# Patient Record
Sex: Female | Born: 1935 | Race: White | Hispanic: No | Smoking: Former smoker
Health system: Southern US, Community
[De-identification: ages and names within clinical notes are randomized; demographics above are authoritative.]

## PROBLEM LIST (undated history)

## (undated) DIAGNOSIS — K224 Dyskinesia of esophagus: Secondary | ICD-10-CM

## (undated) DIAGNOSIS — R609 Edema, unspecified: Secondary | ICD-10-CM

## (undated) DIAGNOSIS — K222 Esophageal obstruction: Secondary | ICD-10-CM

## (undated) DIAGNOSIS — M545 Low back pain, unspecified: Secondary | ICD-10-CM

## (undated) DIAGNOSIS — I6529 Occlusion and stenosis of unspecified carotid artery: Secondary | ICD-10-CM

## (undated) DIAGNOSIS — I10 Essential (primary) hypertension: Secondary | ICD-10-CM

## (undated) DIAGNOSIS — I219 Acute myocardial infarction, unspecified: Secondary | ICD-10-CM

## (undated) DIAGNOSIS — H269 Unspecified cataract: Secondary | ICD-10-CM

## (undated) DIAGNOSIS — M1990 Unspecified osteoarthritis, unspecified site: Secondary | ICD-10-CM

## (undated) DIAGNOSIS — M8580 Other specified disorders of bone density and structure, unspecified site: Secondary | ICD-10-CM

## (undated) DIAGNOSIS — E785 Hyperlipidemia, unspecified: Secondary | ICD-10-CM

## (undated) DIAGNOSIS — I509 Heart failure, unspecified: Secondary | ICD-10-CM

## (undated) DIAGNOSIS — N289 Disorder of kidney and ureter, unspecified: Secondary | ICD-10-CM

## (undated) DIAGNOSIS — I482 Chronic atrial fibrillation, unspecified: Secondary | ICD-10-CM

## (undated) DIAGNOSIS — J449 Chronic obstructive pulmonary disease, unspecified: Secondary | ICD-10-CM

## (undated) DIAGNOSIS — S82891A Other fracture of right lower leg, initial encounter for closed fracture: Secondary | ICD-10-CM

## (undated) DIAGNOSIS — I2510 Atherosclerotic heart disease of native coronary artery without angina pectoris: Secondary | ICD-10-CM

## (undated) HISTORY — DX: Occlusion and stenosis of unspecified carotid artery: I65.29

## (undated) HISTORY — DX: Other fracture of right lower leg, initial encounter for closed fracture: S82.891A

## (undated) HISTORY — DX: Unspecified osteoarthritis, unspecified site: M19.90

## (undated) HISTORY — PX: ESOPHAGOGASTRODUODENOSCOPY: SHX1529

## (undated) HISTORY — DX: Atherosclerotic heart disease of native coronary artery without angina pectoris: I25.10

## (undated) HISTORY — DX: Heart failure, unspecified: I50.9

## (undated) HISTORY — DX: Disorder of kidney and ureter, unspecified: N28.9

## (undated) HISTORY — DX: Hyperlipidemia, unspecified: E78.5

## (undated) HISTORY — DX: Low back pain, unspecified: M54.50

## (undated) HISTORY — DX: Unspecified cataract: H26.9

## (undated) HISTORY — DX: Chronic atrial fibrillation, unspecified: I48.20

## (undated) HISTORY — DX: Other specified disorders of bone density and structure, unspecified site: M85.80

## (undated) HISTORY — DX: Edema, unspecified: R60.9

## (undated) HISTORY — DX: Chronic obstructive pulmonary disease, unspecified: J44.9

## (undated) HISTORY — DX: Essential (primary) hypertension: I10

## (undated) HISTORY — DX: Low back pain: M54.5

## (undated) SURGERY — OPEN REDUCTION INTERNAL FIXATION (ORIF) ELBOW/OLECRANON FRACTURE
Anesthesia: Choice | Laterality: Right

---

## 1991-12-28 HISTORY — PX: CORONARY ANGIOPLASTY WITH STENT PLACEMENT: SHX49

## 1995-04-27 DIAGNOSIS — I2510 Atherosclerotic heart disease of native coronary artery without angina pectoris: Secondary | ICD-10-CM

## 1995-04-27 HISTORY — DX: Atherosclerotic heart disease of native coronary artery without angina pectoris: I25.10

## 2004-12-27 HISTORY — PX: CARDIAC CATHETERIZATION: SHX172

## 2005-03-25 ENCOUNTER — Ambulatory Visit: Payer: Self-pay | Admitting: Cardiology

## 2005-03-26 ENCOUNTER — Inpatient Hospital Stay (HOSPITAL_COMMUNITY): Admission: EM | Admit: 2005-03-26 | Discharge: 2005-03-26 | Payer: Self-pay | Admitting: Emergency Medicine

## 2005-03-26 ENCOUNTER — Encounter: Payer: Self-pay | Admitting: Cardiology

## 2005-03-26 ENCOUNTER — Ambulatory Visit: Payer: Self-pay | Admitting: Cardiology

## 2005-04-15 ENCOUNTER — Ambulatory Visit: Payer: Self-pay | Admitting: Cardiology

## 2005-04-20 ENCOUNTER — Ambulatory Visit: Payer: Self-pay

## 2005-04-29 ENCOUNTER — Ambulatory Visit: Payer: Self-pay | Admitting: Cardiology

## 2005-05-04 ENCOUNTER — Inpatient Hospital Stay (HOSPITAL_BASED_OUTPATIENT_CLINIC_OR_DEPARTMENT_OTHER): Admission: RE | Admit: 2005-05-04 | Discharge: 2005-05-04 | Payer: Self-pay | Admitting: Cardiology

## 2005-05-04 ENCOUNTER — Ambulatory Visit: Payer: Self-pay | Admitting: Cardiology

## 2005-05-27 ENCOUNTER — Ambulatory Visit: Payer: Self-pay | Admitting: Cardiology

## 2006-05-29 ENCOUNTER — Emergency Department (HOSPITAL_COMMUNITY): Admission: EM | Admit: 2006-05-29 | Discharge: 2006-05-30 | Payer: Self-pay | Admitting: Emergency Medicine

## 2007-04-26 ENCOUNTER — Ambulatory Visit: Payer: Self-pay | Admitting: Cardiology

## 2007-05-15 ENCOUNTER — Ambulatory Visit: Payer: Self-pay

## 2007-06-27 ENCOUNTER — Ambulatory Visit: Payer: Self-pay | Admitting: Vascular Surgery

## 2007-07-20 ENCOUNTER — Ambulatory Visit: Payer: Self-pay | Admitting: Internal Medicine

## 2007-08-18 ENCOUNTER — Ambulatory Visit: Payer: Self-pay | Admitting: Internal Medicine

## 2008-08-27 ENCOUNTER — Ambulatory Visit: Payer: Self-pay | Admitting: Vascular Surgery

## 2009-06-10 ENCOUNTER — Ambulatory Visit: Payer: Self-pay | Admitting: Vascular Surgery

## 2010-06-16 ENCOUNTER — Ambulatory Visit: Payer: Self-pay | Admitting: Vascular Surgery

## 2011-05-11 ENCOUNTER — Encounter: Payer: Self-pay | Admitting: Family Medicine

## 2011-05-11 NOTE — Assessment & Plan Note (Signed)
OFFICE VISIT   [name], [name]  DOB:  1933/07/25                                       08/27/2008  CHART#:[id number]   This is an office visit.  The patient returns for further evaluation of  carotid occlusive disease.  I saw her in July of 2008 with a known left  internal carotid occlusion and a 60-70% right internal carotid stenosis  based on a duplex scan done at [facility] last year.  She was to  return in 8 months or if she developed symptoms but did not return until  today and recently had a duplex scan done at [facility].  I have  reviewed those results.  This reveals essentially no change from the  study at [facility] 14 months ago with moderate narrowing of the  right internal carotid and left internal carotid occlusion.  She denies  any hemispheric or nonhemispheric TIAs, amaurosis fugax, diplopia,  blurred vision or syncope.  She is on Coumadin for chronic atrial  fibrillation.  She has no claudication symptoms but does develop  cramping in both legs at times.   PHYSICAL EXAMINATION:  Vital signs:  Blood pressure is 126/64, heart  rate is 80, respirations 14.  Neck:  Carotid pulses 3+ on the right, 1+  on the left.  No bruits audible.  Neurological:  Normal.  No palpable  adenopathy in the neck.  Chest:  Clear to auscultation.  Abdomen:  Soft,  nontender with no masses.  She has 3+ femoral, popliteal and 2+  posterior tibial pulses palpable bilaterally.   I reassured her that the carotid disease does not appear to have  progressed but we do need to follow closely.  I have given her an  appointment to return to see me in 9 months with followup carotid duplex  exam to be done in our office at that time unless she develops any  symptoms in the interim.   Quita Skye Hart Rochester, M.D.  Electronically Signed   JDL/MEDQ  D:  08/27/2008  T:  08/28/2008  Job:  [id number]

## 2011-05-11 NOTE — Assessment & Plan Note (Signed)
OFFICE VISIT   [name], [name]  DOB:  06-28-1933                                       06/16/2010  CHART#:[id number]   The patient returns today for her annual followup regarding her carotid  occlusive disease.  We have followed her since 2008 when she was found  to have a left internal carotid occlusion which was asymptomatic and a  moderate right internal carotid stenosis.  She denies any hemispheric or  nonhemispheric TIAs or amaurosis fugax over the past 12 months and has  had no history of stroke.   CHRONIC MEDICAL PROBLEMS:  1. Coronary artery disease with previous myocardial infarction 15      years ago.  2. COPD.  3. Hypertension.  4. Hyperlipidemia.  5. Negative for diabetes or stroke.   SOCIAL HISTORY:  She is widowed and retired.  Smokes a pack of  cigarettes per day for 60+ years but now smokes only one occasionally  since December of 2010.  Does not use alcohol.   REVIEW OF SYSTEMS:  Negative for chest pain but does have dyspnea on  exertion, occasional episode of atrial fibrillation in the past, does  have chronic bronchitis on home oxygen at times at night, arthritis,  joint pain, muscle pain, urinary frequency.  All other systems are  negative on review of systems.   PHYSICAL EXAMINATION:  Vital signs:  Blood pressure 111/68, heart rate  88, respirations 14.  General:  She is a female patient who is in no  apparent distress.  Alert and oriented times 3.  Neck:  Is supple, 3+  carotid pulses, soft bruit on the right.  Neurological:  Normal.  Chest:  Clear to auscultation with no wheezing.  Cardiovascular:  Regular  rhythm.  No murmurs.  Abdomen:  Soft, nontender with no masses.  She has  3+ femoral pulses bilaterally.   Today I ordered a carotid duplex exam which I have reviewed and  interpreted.  Right internal carotid artery has approximately 40%-50%  stenosis, left internal carotid is chronically occluded.  We will  continue to  follow her on an annual basis on the carotid protocol unless  she develops any new neurologic symptoms to be certain she does not have  progressive disease on her right internal carotid contralateral to the  occlusion.     Quita Skye Hart Rochester, M.D.  Electronically Signed   JDL/MEDQ  D:  06/16/2010  T:  06/17/2010  Job:  [id number]

## 2011-05-11 NOTE — Consult Note (Signed)
VASCULAR SURGERY CONSULTATION   [name], [name]  DOB:  [birth year]                                       06/27/2007  CHART#:[id number]   [name] is a 77 year old female who had an asymptomatic carotid bruit  noted by Dr. Antoine Poche, and a carotid duplex exam was performed on May 15, 2007.  I have reviewed these reports which reveal that the left  internal carotid is totally occluded, and the right internal carotid has  an approximate 60-70% stenosis.  She denies any previous history of  stroke, TIAs, amaurosis fugax, diplopia, blurred vision, or syncope.  She is referred for further evaluation.   PAST MEDICAL HISTORY:  1. Hypertension.  2. Hyperlipidemia.  3. COPD.  4. Atrial fibrillation on chronic Coumadin.  5. Coronary artery disease, previous myocardial infarction in 1998      with PTCA and stenting in the past and a repeat cardiac cath by Dr.      Juanda Chance in 2006.   FAMILY HISTORY:  Positive for stroke in her mother, negative for  coronary artery disease and diabetes.   SOCIAL HISTORY:  She is a widow and retired.  She has smoked a pack of  cigarettes per day for 50+ years.  Does not use alcohol.   ALLERGIES:  None known.   REVIEW OF SYSTEMS AND MEDICATIONS:  Please see health history form.   PHYSICAL EXAMINATION:  VITAL SIGNS:  Blood pressure 171/80, heart rate  60, respirations 18.  GENERAL:  She is a female patient in no apparent distress.  She alert  and oriented x3.  NECK:  Supple, 3+ carotid pulses palpable.  No bruits are audible on the  left.  There is a soft bruit on the right.  No palpable adenopathy in  the neck.  NEUROLOGIC:  Normal.  UPPER EXTREMITIES:  Pulses are 3+ bilaterally.  CHEST:  Clear to auscultation.  CARDIOVASCULAR:  Irregular rhythm with no murmurs.  ABDOMEN:  Soft, nontender, with no masses.  EXTREMITIES:  3+ femoral and 2+ posterior tibial pulses bilaterally.   I reviewed her carotid duplex exam report and feel that  she does have an  asymptomatic 60-70% right internal carotid stenosis with left internal  carotid occlusion.  We need to follow this closely.  We need to follow  this closely to be sure she does not have progression disease on the  right side.  If she develops any symptoms, she will be in touch with me.  Otherwise, I will see her in 8 months for followup carotid duplex exam.   Quita Skye. Hart Rochester, M.D.  Electronically Signed  JDL/MEDQ  D:  06/27/2007  T:  06/28/2007  Job:  [id number]   cc:   Rollene Rotunda, MD, [facility]  Ernestina Penna, M.D.

## 2011-05-11 NOTE — Assessment & Plan Note (Signed)
[facility]                             PULMONARY OFFICE NOTE   KATURAH, KARAPETIAN                       MRN:          [id number]  DATE:08/18/2007                            DOB:          [birth year]    PROBLEMS:  1. Chronic obstructive pulmonary disease.  2. Abnormal chest x-ray.  3. Tobacco abuse.   HISTORY:  She is trying to cut down some on her smoking.  Still notices  some cough productive only of white sputum.  Paulita Cradle put her back  on a slow prednisone taper using 20-mg tablets, which she tolerated.  We  discussed pulmonary rehabilitation and she feels she lives too far away  for that to be practical, at least with the [facility] program.  Her  medication list is reviewed without changes noted.   OBJECTIVE:  Weight 194 pounds, BP 120/72, pulse 63, room air saturation  93%.  Breath sounds are diminished, somewhat coarse, and she coughs a  little with deep breath.  No dullness or rub.  I find no adenopathy.  Heart sounds are regular without murmur or gallop.  PFT:  Moderate  obstructive airways disease with insignificant response to  bronchodilator.  Air trapping with reduced forced vital capacity and  increased residual volume.  Normal total lung capacity.  Diffusion was  severely reduced.  Her FEV-1 was 1.06 L (57% of predicted) with FEV-  1/FVC ratio 0.72, reflecting reductions in both flow and exhaled volume.  On a 6-minute walk test she went 297 meters but she had to stop three  times because of shortness of breath for a total of 50 seconds.  She  also complained of some low-back pain while walking.  During the walk  she desaturated from 93% at the beginning to 87% on room air, reflecting  a pulmonary limitation to her exercise tolerance.  Two minutes later she  had rebounded to 91% on room air.   IMPRESSION:  Moderately-severe chronic obstructive pulmonary disease  with ongoing tobacco abuse.   PLAN:  1. We reinforced smoking  cessation, reviewing available support.  2. She is encouraged to walk as tolerated to maintain endurance and we      looked at potential need for oxygen, at least with exertion.  Right      now I think it would be as much trouble as benefit, unless we      demonstrate that she is also hypoxic at other times.   We are scheduling return in 6 months, but earlier p.r.n.     Clinton D. Maple Hudson, MD, [facility] Bollman, FACP  Electronically Signed   CDY/MedQ  DD: 08/28/2007  DT: 08/28/2007  Job #: [id number]   cc:   Ernestina Penna, M.D.

## 2011-05-11 NOTE — Assessment & Plan Note (Signed)
[facility]                             PULMONARY OFFICE NOTE   [name], [name]                       MRN:          [id number]  DATE:07/20/2007                            DOB:          09/27/1933    PROBLEM LIST:  Abnormal chest x-ray.   HISTORY:  A 77 year old smoker seen on kind referral from Birdena Jubilee  in [place].  She had a bronchitis or pneumonia treated as an outpatient  a month ago with antibiotics.  Chest x-ray at that time had shown a  residual density in the right lung.  She is back to what she considers  normal baseline morning cough, maybe a little bit more than usual and  still more phlegm than usual, although was only white in color.  There  has been no blood.  She denies chest pain or adenopathy.  She is slowly  tapering prednisone and using her inhalers p.r.n.  She uses Advair and  Spiriva most days.  Shortness of breath varies with level of exertion  but is probably close to her normal.   MEDICATIONS:  1. Diovan HCT 160/12.5.  2. Warfarin.  3. Cartia XT 180 mg.  4. Buspirone 15 mg 1-3 times daily.  5. Caduet 5/20.  6. Protonix 40 mg.  7. HCTZ 12.5 mg.  8. Advair 250/50.  9. Celebrex 200 mg.  10.Spiriva.  11.Combivent rescue inhaler p.r.n.  12.Prednisone tapering.   No medication allergy.   REVIEW OF SYSTEMS:  Exertional dyspnea, productive cough, some swelling  of hands and feet.  Her breathing does not wake her at night.  She  denies significant weight change, adenopathy, or blood, and as long as  she takes her diuretic, her feet do not swell.   PAST HISTORY:  1. Coronary disease with inferior myocardial infarction.  2. Hypertension.  3. Asthma.  4. Elevated cholesterol.  5. Cardiac stent placed by [facility].  No exposure to tuberculosis.   SOCIAL HISTORY:  Still smoking a half a pack per day, more in the past.  She is widowed with no children, living alone, retired from work as an  Emergency planning/management officer.   FAMILY HISTORY:  Emphysema, arthritis, cancer.   OBJECTIVE:  VITAL SIGNS:  Weight 188, BP 122/62, pulse 60, room air  saturation 91%.  I do not find adenopathy.  HEENT:  There is mild yeast on her tongue.  No stridor or neck vein  distention.  CHEST:  Quiet.  She coughs and wheezes a little bit with forced deep  breaths.  No dullness.  HEART:  Sounds are regular without murmur or gallop heard.  EXTREMITIES:  There is no peripheral edema.   VACCINATION HISTORY:  Pneumococcal vaccine in 2007.   IMPRESSION:  1. Chronic obstructive pulmonary disease.  2. Ongoing tobacco abuse.  3. Recent pneumonia.  4. Abnormal chest x-ray about a month ago.   PLAN:  1. Chest x-ray.  2. Schedule pulmonary function test.  3. Smoking cessation was discussed, offering support.  4. Nebulizer treatment given here, Xopenex 1.25 mg for trial.  5. Schedule return 3  weeks, earlier p.r.n.     Clinton D. Maple Hudson, MD, [facility] Bollman, FACP  Electronically Signed    CDY/MedQ  DD: 07/22/2007  DT: 07/23/2007  Job #: [id number]   cc:   Birdena Jubilee, NP

## 2011-05-11 NOTE — Assessment & Plan Note (Signed)
OFFICE VISIT   [name], [name]  DOB:  14-Dec-1933                                       06/10/2009  CHART#:[id number]   The patient returns for continued following her carotid occlusive  disease having been found to have a totally occluded left internal  carotid artery in 2008 at [facility] and a moderate stenosis in  the right internal carotid.  This ultrasound was repeated at [facility] 9 months ago with similar findings and today she returns.  She  denies any history of hemispheric or nonhemispheric TIAs, amaurosis  fugax, diplopia, blurred vision or syncope.  She is taking chronic  Coumadin for atrial fibrillation.  She has got no claudication symptoms  and denies any chest pain or dyspnea on exertion.   PHYSICAL EXAM:  Blood pressure 112/60 in the right arm, 88/50 in the  left arm, heart rate is 96, respirations 14.  Carotid pulses are 3+ with  soft bruit on the right.  Neurological:  Normal.  Chest:  Clear to  auscultation.  Cardiovascular:  Exam reveals an irregular rhythm, no  murmurs.  Abdomen:  Soft, nontender with no masses.  She has a 3+  femoral, popliteal and posterior tibial pulses bilaterally.   Carotid duplex exam today reveals a 60% right internal carotid stenosis  and left internal carotid occlusion which is unchanged from previous  study.  I discussed this with her and as she does not have any new  neurologic symptoms we will see her back in 1 year with a followup  carotid duplex exam at that time.   Quita Skye Hart Rochester, M.D.  Electronically Signed   JDL/MEDQ  D:  06/10/2009  T:  06/11/2009  Job:  [id number]

## 2011-05-11 NOTE — Procedures (Signed)
CAROTID DUPLEX EXAM   INDICATION:  Follow-up carotid artery disease.   HISTORY:  Diabetes:  No.  Cardiac:  MI, stent.  Hypertension:  Yes.  Smoking:  Yes.  Previous Surgery:  No.  CV History:  Known  left ICA occlusion, asymptomatic.  Amaurosis Fugax No., Paresthesias No, Hemiparesis No.                                       RIGHT             LEFT  Brachial systolic pressure:         124               126  Brachial Doppler waveforms:         WNL               WNL  Vertebral direction of flow:        Antegrade         Antegrade  DUPLEX VELOCITIES (cm/sec)  CCA peak systolic                   96                53  ECA peak systolic                   158               190  ICA peak systolic                   196               Occluded  ICA end diastolic                   67                Occluded  PLAQUE MORPHOLOGY:                  Calcific.         Mixed  PLAQUE AMOUNT:                      Moderate          Occluded ICA  PLAQUE LOCATION:                    ICA/ECA           ICA/ECA   IMPRESSION:  1. Right ICA shows evidence of 40-59% stenosis (top end of range).  2. Left ICA shows evidence of occlusion (known).  3. Left ECA stenosis.  4. Right ICA shows evidence of calcific plaque with acoustic      shadowing.  5. No significant changes from previous study done at [facility].      ___________________________________________  Quita Skye. Hart Rochester, M.D.   AS/MEDQ  D:  06/10/2009  T:  06/10/2009  Job:  [id number]

## 2011-05-11 NOTE — Procedures (Signed)
CAROTID DUPLEX EXAM   INDICATION:  Carotid disease.   HISTORY:  Diabetes:  no  Cardiac:  MI, CHF  Hypertension:  yes  Smoking:  previous  Previous Surgery:  no  CV History:  Currently asymptomatic.  Amaurosis Fugax No, Paresthesias No, Hemiparesis No                                       RIGHT             LEFT  Brachial systolic pressure:         114               112  Brachial Doppler waveforms:         normal            normal  Vertebral direction of flow:        antegrade         antegrade  DUPLEX VELOCITIES (cm/sec)  CCA peak systolic                   55                38  ECA peak systolic                   107               118  ICA peak systolic                   138               occluded  ICA end diastolic                   56  PLAQUE MORPHOLOGY:                  calcific          mixed  PLAQUE AMOUNT:                      moderate          occlusive  PLAQUE LOCATION:                    ICA/ECA/CCA       ICA/ECA/CCA   IMPRESSION:  1. Doppler velocities suggest 40% to 59% stenosis of the right      proximal internal carotid artery.  2. Known occlusion of the left internal carotid artery.  3. No significant change noted when compared to the previous exam on      06/10/2009.   ___________________________________________  Quita Skye. Hart Rochester, M.D.   CH/MEDQ  D:  06/17/2010  T:  06/17/2010  Job:  [id number]

## 2011-05-14 NOTE — Cardiovascular Report (Signed)
NAME:  Katie, Melendez NO.:  [id number]   MEDICAL RECORD NO.:  [id number]          PATIENT TYPE:  OIB   LOCATION:  6501                         FACILITY:  [facility]   PHYSICIAN:  Charlies Constable, M.D. [facility] DATE OF BIRTH:  [birth year]   DATE OF PROCEDURE:  05/04/2005  DATE OF DISCHARGE:                              CARDIAC CATHETERIZATION   CLINICAL HISTORY   HISTORY OF PRESENT ILLNESS:  [name] is a 77 year old who has a history of  recent atrial fibrillation which has been managed with Coumadin and rate  control medications.  When she was admitted to the hospital with atrial  fibrillation, she had an abnormal troponin and subsequently had a Cardiolite  scan which was interpreted as showing mild ischemia in the mid to distal  anterior wall.  Ejection fraction was 59%.  She has had no chest pain.  Because of the abnormal scan, she was scheduled for evaluation with  angiography in the outpatient laboratory.   PROCEDURE:  The procedure was performed by the right femoral artery using  arterial sheath and 4 French preformed coronary catheters.  A femoral  arterial punch was performed and Omnipaque contrast was used.  A distal  aortogram was performed to evaluate her peripheral vascular disease.  She  tolerated the procedure well and left the laboratory in satisfactory  condition.   RESULTS:  1.  The left main coronary artery was free of significant disease.  2.  The left anterior descending artery gave rise to a large diagonal branch      with two separate perforators and small diagonal branch.  There was 40%      proximal and 30% mid narrowing in the left anterior descending.  There      was 40% narrowing in the large diagonal branch.  There was moderate      calcification.  3.  The circumflex artery gave rise to a large marginal branch and a small      AV branch which terminated into a posterior lateral branch.  There was      surface narrowing of the proximal  circumflex artery before the marginal      branch.  4.  The right coronary artery is a moderately large vessel which gave rise      to a right ventricular branch, a second acute marginal branch which      supplied the inferior wall and a large posterior lateral branch.  There      was 40% narrowing within the ________________stent that was placed in      1993.  There was 70to 80% narrowing in the second acute marginal branch      which supplied the inferior wall.  5.  The left ventriculogram performed in the RAO projection showed akinesis      of the mid inferior wall.  Rest of wall motion was quite good, and the      estimated ejection fraction was 55%.  6.  A distal aortogram was performed which showed 30% narrowing in each      renal artery.  There were  irregularities in the distal aorta, but no      significant aorta-iliac obstruction.  7.  The aortic pressure was 162/72, with a mean of 105, left ventricular      pressure of 162/21.   CONCLUSIONS:  Coronary artery disease status post previous stenting of the  proximal right coronary artery in 1993, with 40% narrowing at the stent  site, 70 to 80% narrowing in the second acute marginal branch of the right  coronary artery supplying the inferior wall, 40% proximal and 30% mid  stenosis of the left anterior descending artery with 40% narrowing of in the  first diagonal branch, 30% narrowing in the proximal portion of the  circumflex artery, and mid inferior wall akinesis with an estimated ejection  fraction of 55%.   RECOMMENDATIONS:  The patient has mostly non-obstructive coronary disease.  The lesion in the acute marginal branch of the right coronary artery which  does supply the equivalent of the posterior descending artery appears  borderline, but this did not show up as ischemic on the Cardiolite scan.  She also has had no chest pain.  We will plan to continue medical therapy.  We will arrange followup with Dr.  Antoine Poche.      BB/MEDQ  D:  05/04/2005  T:  05/04/2005  Job:  [id number]   cc:   Birdena Jubilee, NP   Ernestina Penna, M.D.  [address] [place]  [place] [address]  Fax: [phone]   Rollene Rotunda, M.D.   Charlies Constable, M.D. [facility]

## 2011-05-14 NOTE — Discharge Summary (Signed)
NAME:  [name], [name] NO.:  [id number]   MEDICAL RECORD NO.:  [id number]          PATIENT TYPE:  INP   LOCATION:  3737                         FACILITY:  [facility]   PHYSICIAN:  Willa Rough, M.D.     DATE OF BIRTH:  1933-06-01   DATE OF ADMISSION:  03/25/2005  DATE OF DISCHARGE:  03/26/2005                                 DISCHARGE SUMMARY   DISCHARGE DIAGNOSES:  1.  Atrial fibrillation, controlled rate currently, new onset initially      rapid ventricular response.  2.  Status post 2D echocardiogram with left ventricular ejection fraction      estimated at 65%, no left ventricular regional wall motion      abnormalities.  3.  Initiation of anticoagulation therapy with Coumadin bridged with Lovenox      inpatient.   DISPOSITION:  The patient is being discharged home after speaking with Dr.  Willa Rough.  The patient has prescription for Coumadin 2.5 mg daily,  Diltiazem 60 mg p.o. t.i.d.  She has been instructed to continue her Lipitor  20 mg daily, Diovan 160 mg daily, decrease her Ecotrin to 81 mg daily, her  BuSpar as previously taken.  I have instructed the patient to hold her beta  blocker until seen by Dr. Antoine Poche.  She will follow up at [facility] with Dr. Angelina Sheriff.  The office there will  call her and schedule the appointment.  I have also instructed the patient,  she needs to have a PT, INR drawn Monday at [facility] and office will call her.  I have also called Dr. Jenene Slicker nurse  at our [facility] office and left a message for her to get in touch with  [facility] to schedule patient for appointment.  I have instructed  the patient to resume her activity as tolerated.  She is to return to the  hospital or call 911 if she experiences any shortness of breath, chest pain  or rapid increase in pulse.  Otherwise she will follow up with Dr. Antoine Poche  at [facility] as  scheduled.   HOSPITAL COURSE:  This is a very pleasant 77 year old Caucasian female who  is followed by Dr. Birdena Jubilee in [place], [place], who presented  with complaints of palpitations, sudden onset, heart pounding out of chest,  with some mild chest discomfort.  She presented to [facility] Emergency  Department.  She was seen by Dr. Donnamarie Rossetti.  The patient was initially  treated with IV Cardizem, placed on Lovenox, was eventually switched over to  p.o. Cardizem, scheduled for echocardiogram, results as stated above.  TSH  level was also checked, at 0.5000.  Hemoglobin A1C of 6.5.  The patient has  known history of borderline diabetes.  Her point of care markers were  negative x3 with initial cardiac enzymes, Troponin 0.12, CK total 120, CK MB  5.0 and a relative index of 4.2.  The patient is currently in atrial  fibrillation at a controlled rate, denies any chest pain or shortness of  breath.  Dr. Myrtis Ser has reviewed her echocardiogram, feels that patient is  stable enough to be discharged home and follow up with Dr. Antoine Poche  outpatient as planned above.  The patient agrees to call 911 if she  experiences any chest pain, shortness of breath or increase in heart rate.      MB/MEDQ  D:  03/26/2005  T:  03/27/2005  Job:  [id number]

## 2011-05-14 NOTE — H&P (Signed)
NAME:  KYLAR, SPEELMAN NO.:  [id number]   MEDICAL RECORD NO.:  [id number]          PATIENT TYPE:  INP   LOCATION:                               FACILITY:  [facility]   PHYSICIAN:  [place] Bing, M.D.  DATE OF BIRTH:  [birth year]   DATE OF ADMISSION:  03/26/2005  DATE OF DISCHARGE:                                HISTORY & PHYSICAL   REFERRING:  Dr. Lynelle Doctor.   PRIMARY CARE PHYSICIAN:  Dr. Rudi Heap.   PRIMARY CARDIOLOGIST:  Dr. Myrtis Ser previously   HISTORY OF PRESENT ILLNESS:  A 77 year old woman presented to [facility]  Emergency Department for palpitations and found to have atrial fibrillation.  Ms. Taulbee cardiac history dates to approximately 75 years earlier when she  suffered an inferior myocardial infarction by her history.  Percutaneous  intervention was performed at that time.  She has done well since with  essentially no cardiopulmonary symptoms.  She was followed by a cardiologist  for a while, but not in recent years.  She is somewhat active, performing  housework and some yard work and this results in fatigue, but no dyspnea.  She continues to be a cigarette smoker.  She has had hypertension, but not  diabetes.  She has hyperlipidemia.   She noted the sudden onset of severe palpitations earlier this evening with  a sensation as if her heart was beating out of her chest.  There was some  minor associated localized chest discomfort.  She had no dyspnea nor  diaphoresis.  She came immediately to the emergency department where  intravenous diltiazem resulted in complete resolution of her symptoms.   PAST MEDICAL HISTORY:  Unremarkable.  She has never had surgery.   MEDICATIONS:  1.  Atorvastatin 20 mg daily.  2.  Enteric-coated aspirin 325 mg daily.  3.  Diovan 160 mg daily.  4.  Buspirone 15 mg daily.   ALLERGIES:  The patient reports no drug allergies.   SOCIAL HISTORY:  Lives in [place] alone; widowed with no children.  A sister and  brother-in-law live nearby.  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  She has a 40-pack-year history of  cigarette smoking, but no excessive use of alcohol.   FAMILY HISTORY:  Father had a pacemaker; no coronary artery disease.   REVIEW OF SYSTEMS:  Notable for occasional headaches.  All other systems  reviewed and are negative.   PHYSICAL EXAMINATION:  GENERAL:  Pleasant woman with a raspy voice in no  acute distress.  VITAL SIGNS:  The temperature is 100.2, blood pressure 155/75, heart rate 80  and irregular, respirations 18.  HEENT:  Anicteric sclerae; normal lids and conjunctiva; pupils equal, round,  react to light.  NECK:  No jugular venous distension; normal carotid upstrokes without  bruits.  ENDOCRINE:  No thyromegaly.  HEMATOPOEITIC:  No cervical, axillary, or inguinal adenopathy.  SKIN:  Multiple pigmented and nonpigmented nevi.  CARDIAC:  Irregular rhythm; distant first and second heart sounds.  LUNGS:  No rales; prolonged expiratory phase with minor expiratory wheeze.  ABDOMEN:  Soft and nontender; no organomegaly; no masses; normal bowel  sounds without bruits.  EXTREMITIES:  No clubbing nor edema; 1+ dorsalis pedis pulses; 2+ posterior  tibial pulses.  MUSCULOSKELETAL:  No joint deformity.  NEUROMUSCULAR:  Symmetric strength and tone; normal cranial nerves.  PSYCHIATRIC:  Normal affect.   EKG: Atrial fibrillation with a rapid ventricular response; possible prior  inferior myocardial infarction; inferolateral ischemia versus LVH.  Chest x-  ray: Cardiomegaly; bibasilar atelectasis.   Other laboratory notable for normal CBC and normal chemistry profile except  for a sugar of 245.  Point of care markers are negative.   IMPRESSION:  Ms. Blumenstein presents with new onset atrial fibrillation.  Symptoms have since subsided with control of heart rate but she has not  reverted to normal sinus rhythm.  She has a low to moderate risk of   thromboembolism.  She will be treated with low molecular weight heparin for  the time being, but may not require long-term anticoagulation with warfarin.   Hypertension appears to be adequately controlled.  We will assess  hyperlipidemia with a lipid panel.  TSH and echocardiogram will be obtained.   Fasting glucose is elevated.  Hemoglobin A1C level will be obtained.  The  patient has been told of borderline diabetes in the past, but has never been  frankly diabetic.  She will almost certainly need medical therapy.      RR/MEDQ  D:  03/26/2005  T:  04/22/2005  Job:  [id number]

## 2011-05-14 NOTE — Progress Notes (Signed)
[facility]                        PERIPHERAL VASCULAR OFFICE NOTE   [name], [name]                       MRN:          [id number]  DATE:04/26/2007                            DOB:          04/02/1933    PRIMARY CARE PHYSICIAN:  Birdena Jubilee, NP   REASON FOR VISIT:  Evaluate patient with coronary disease and ongoing  risk factors.   HISTORY OF PRESENT ILLNESS:  The patient is a pleasant, 77 year old with  coronary artery disease as described below.  Since I last saw her, she  has had no new cardiac symptoms.  She does not exercise, but she does do  her chores of daily living.  With this, she denies any chest discomfort,  neck of arm discomfort.  She does have some back discomfort when she  moves her arm or shoulder in a certain way and blames this on old age.  She has had no new shortness of breath.  She denies any PND or  orthopnea.  She has no palpitations, presyncope of syncope.  Unfortunately, she continues to smoke half a pack of cigarettes a day.   PAST MEDICAL HISTORY:  1. Coronary artery disease (catheterization in 2006, with LAD 30% mid      stenosis, diagonal-1 40% stenosis, circumflex free of significant      disease, right coronary artery 40% stenosis in a previously placed      stent, 70% stenosis of the second acute marginal which was large      and supplied the inferior wall, ejection fraction was 55%, renal      arteries had 30% stenosis).  2. Persistent atrial fibrillation (maintaining sinus rhythm since      2006).  3. Hypertension.  4. Hyperlipidemia.  5. Ongoing tobacco abuse.   ALLERGIES:  No known drug allergies.   MEDICATIONS:  1. Diovan HCT 160/12.5 daily.  2. Coumadin per [facility].  3. Multivitamin.  4. Diltiazem 180 mg daily.  5. Buspirone.  6. Caduet 5/20 daily.  7. Protonix 40 mg daily.   REVIEW OF SYSTEMS:  As stated in the HPI and negative for other systems.   PHYSICAL EXAMINATION:   GENERAL:  The patient is in no acute distress.  VITAL SIGNS:  Blood pressure 144/68, heart rate 57 and regular, weight  186 pounds, body mass index 32.  HEENT:  Eyes unremarkable, pupils equal round and reactive to light,  fundi not visualized, oral mucosa unremarkable.  NECK:  No jugular venous distention at 45 degrees.  Carotid upstroke  brisk and symmetric.  Soft, right carotid bruit.  No thyromegaly.  LYMPHS:  No cervical, axillary, inguinal adenopathy.  LUNGS:  Decreased breath sounds without wheezing or crackles.  BACK:  No costovertebral angle tenderness.  CHEST:  Unremarkable.  HEART:  PMI nondisplaced.  S1, S2 within normal limits.  No S3, S4,  clicks, rubs or murmurs.  ABDOMEN:  Flat, positive bowel sounds.  Normal frequency and pitch.  No  bruits, no guarding.  No midline pulsatile masses.  No hepatomegaly or  splenomegaly.  SKIN:  No rashes.  EXTREMITIES:  2+ pulses, right  femoral bruit.  No cyanosis or clubbing,  mild bilateral lower extremity edema.  NEUROLOGIC:  Oriented to person, place and time.  Cranial nerves 2-12  grossly intact.  Motor grossly intact.   LABORATORY DATA AND X-RAY FINDINGS:  EKG sinus bradycardia with rate 57,  axis within normal limits, intervals within normal limits, premature  atrial contractions.  No acute ST and T wave changes.   ASSESSMENT/PLAN:  1. Coronary disease.  The patient is having no new symptoms related to      this.  No further cardiovascular testing is suggested.  Will      continue with aggressive risk production.  2. Bruit.  Will get carotid Dopplers to screen for stenosis.  3. Tobacco.  We had a long discussion about the need to stop smoking.      She is not interested in Chantix.  I doubt that she will commit to      trying to quit.  4. Hypertension.  Blood pressure is slightly elevated today, but this      is unusual.  She will continue the medications as listed.  5. Dyslipidemia per [facility].  6. Atrial  fibrillation.  She has had no symptomatic paroxysms of this.      However, she had documented persistent dysrhythmia in the past and      will remain on the Coumadin and rate-control medications as listed.   FOLLOW UP:  I will see her back in 1 year or sooner if needed.     Rollene Rotunda, MD, [facility]  Electronically Signed    JH/MedQ  DD: 04/26/2007  DT: 04/26/2007  Job #: [id number]   cc:   Birdena Jubilee, NP

## 2011-05-28 ENCOUNTER — Other Ambulatory Visit: Payer: Self-pay

## 2011-06-04 ENCOUNTER — Other Ambulatory Visit (INDEPENDENT_AMBULATORY_CARE_PROVIDER_SITE_OTHER): Payer: Medicare Other

## 2011-06-04 DIAGNOSIS — I6529 Occlusion and stenosis of unspecified carotid artery: Secondary | ICD-10-CM

## 2011-06-10 NOTE — Procedures (Unsigned)
CAROTID DUPLEX EXAM  INDICATION:  Carotid artery disease.  HISTORY: Diabetes:  No. Cardiac:  MI 1996. Hypertension:  Yes. Smoking:  Yes. Previous Surgery:  No. CV History:  No. Amaurosis Fugax No, Paresthesias No, Hemiparesis No. Other:  Left known ICA occlusion.                                      RIGHT             LEFT Brachial systolic pressure: Brachial Doppler waveforms: Vertebral direction of flow:        Antegrade DUPLEX VELOCITIES (cm/sec) CCA peak systolic                   64 ECA peak systolic                   48 ICA peak systolic                   128 ICA end diastolic                   60 PLAQUE MORPHOLOGY:                  Calcific PLAQUE AMOUNT:                      Moderate PLAQUE LOCATION:                    ICA, ECA, CCA  IMPRESSION: 1. Right ICA velocities suggest 40-59% stenosis. 2. Left known ICA occlusion. 3. No significant change from the previous exam.  ___________________________________________ Quita Skye. Hart Rochester, M.D.  EM/MEDQ  D:  06/07/2011  T:  06/07/2011  Job:  [id number]

## 2011-09-27 DIAGNOSIS — K224 Dyskinesia of esophagus: Secondary | ICD-10-CM

## 2011-09-27 HISTORY — DX: Dyskinesia of esophagus: K22.4

## 2011-09-28 ENCOUNTER — Ambulatory Visit (HOSPITAL_COMMUNITY)
Admission: RE | Admit: 2011-09-28 | Discharge: 2011-09-28 | Disposition: A | Discharge status: Home / Self Care | Payer: Medicare Other | Source: Ambulatory Visit | Attending: Internal Medicine | Admitting: Internal Medicine

## 2011-09-28 ENCOUNTER — Encounter: Payer: Self-pay | Admitting: Physician Assistant

## 2011-09-28 ENCOUNTER — Telehealth: Payer: Self-pay | Admitting: Gastroenterology

## 2011-09-28 DIAGNOSIS — K222 Esophageal obstruction: Secondary | ICD-10-CM | POA: Insufficient documentation

## 2011-09-28 DIAGNOSIS — IMO0002 Reserved for concepts with insufficient information to code with codable children: Secondary | ICD-10-CM | POA: Insufficient documentation

## 2011-09-28 DIAGNOSIS — J4489 Other specified chronic obstructive pulmonary disease: Secondary | ICD-10-CM | POA: Insufficient documentation

## 2011-09-28 DIAGNOSIS — R1310 Dysphagia, unspecified: Secondary | ICD-10-CM | POA: Insufficient documentation

## 2011-09-28 DIAGNOSIS — K449 Diaphragmatic hernia without obstruction or gangrene: Secondary | ICD-10-CM | POA: Insufficient documentation

## 2011-09-28 DIAGNOSIS — Z7901 Long term (current) use of anticoagulants: Secondary | ICD-10-CM | POA: Insufficient documentation

## 2011-09-28 DIAGNOSIS — K219 Gastro-esophageal reflux disease without esophagitis: Secondary | ICD-10-CM

## 2011-09-28 DIAGNOSIS — K2210 Ulcer of esophagus without bleeding: Secondary | ICD-10-CM | POA: Insufficient documentation

## 2011-09-28 DIAGNOSIS — Z79899 Other long term (current) drug therapy: Secondary | ICD-10-CM | POA: Insufficient documentation

## 2011-09-28 DIAGNOSIS — I4891 Unspecified atrial fibrillation: Secondary | ICD-10-CM | POA: Insufficient documentation

## 2011-09-28 DIAGNOSIS — F17200 Nicotine dependence, unspecified, uncomplicated: Secondary | ICD-10-CM | POA: Insufficient documentation

## 2011-09-28 DIAGNOSIS — J449 Chronic obstructive pulmonary disease, unspecified: Secondary | ICD-10-CM | POA: Insufficient documentation

## 2011-09-28 DIAGNOSIS — T18108A Unspecified foreign body in esophagus causing other injury, initial encounter: Secondary | ICD-10-CM | POA: Insufficient documentation

## 2011-09-28 DIAGNOSIS — I2510 Atherosclerotic heart disease of native coronary artery without angina pectoris: Secondary | ICD-10-CM | POA: Insufficient documentation

## 2011-09-28 NOTE — Progress Notes (Signed)
  Subjective:    Patient ID: [name], female    DOB: [birth year], 77 y.o.   MRN: [id number]  HPI    Review of Systems     Objective:   Physical Exam        Assessment & Plan:  Instructed pt's niece to keep pt at home until we call her. Dr Marina Goodell is at the hospital and he may want her to go directly to ENDO.

## 2011-09-28 NOTE — Telephone Encounter (Signed)
Spoke with Debbie at [facility] and pt choked on steak over the weekend and can't keep anything down. She tried to drink in their office and she spit it back up. Advised Debbie to inform pt to go to the [facility] ER; we can't handle an impaction. Eunice Blase will inform pt.

## 2011-09-29 ENCOUNTER — Telehealth: Payer: Self-pay

## 2011-09-29 ENCOUNTER — Other Ambulatory Visit: Payer: Self-pay | Admitting: Internal Medicine

## 2011-09-29 NOTE — Telephone Encounter (Signed)
Pt scheduled for barium esophagram 10/12/11@[facility]  arrival time 9:15am, procedure scheduled for 9:30am. Pt aware of appt date and time.

## 2011-10-12 ENCOUNTER — Ambulatory Visit (HOSPITAL_COMMUNITY)
Admit: 2011-10-12 | Discharge: 2011-10-12 | Disposition: A | Discharge status: Home / Self Care | Payer: Medicare Other | Attending: Internal Medicine | Admitting: Internal Medicine

## 2011-10-12 ENCOUNTER — Telehealth: Payer: Self-pay

## 2011-10-12 DIAGNOSIS — K449 Diaphragmatic hernia without obstruction or gangrene: Secondary | ICD-10-CM | POA: Insufficient documentation

## 2011-10-12 DIAGNOSIS — R1310 Dysphagia, unspecified: Secondary | ICD-10-CM | POA: Insufficient documentation

## 2011-10-12 NOTE — Telephone Encounter (Signed)
Pt scheduled for previsit 10/15/11@3 :30pm. EGD with dil scheduled for 10/19/11@1 :30pm in the [facility]. Pt aware of appt date and time. Pt currently taking coumadin. Call placed to [facility] to Paulita Cradle regarding stopping the coumadin for the procedure.

## 2011-10-12 NOTE — Telephone Encounter (Signed)
Message copied by Michele Mcalpine on Tue Oct 12, 2011  1:37 PM ------      Message from: Hilarie Fredrickson      Created: Tue Oct 12, 2011 12:17 PM       Let pt know that swallow study has been reviewed. She needs to stay on PPI and set her up for EGD with dilation in the [facility]

## 2011-10-12 NOTE — Telephone Encounter (Signed)
Spoke with Birdena Jubilee NP at [facility]. Pt is to have a PT/INR checked Thursday. Per Birdena Jubilee NP pt may hold her coumadin for 3 days. Pt to hold coumadin starting 10/16/11. Pt to pick up note from their office at the time of her labs. Requested pt bring note to her previsit on 10/15/11. Pt verbalized understanding.

## 2011-10-12 NOTE — Telephone Encounter (Signed)
Ok, great. Thanks

## 2011-10-15 ENCOUNTER — Ambulatory Visit (AMBULATORY_SURGERY_CENTER): Payer: Medicare Other | Admitting: *Deleted

## 2011-10-15 DIAGNOSIS — R1310 Dysphagia, unspecified: Secondary | ICD-10-CM

## 2011-10-19 ENCOUNTER — Encounter: Payer: Medicare Other | Admitting: Internal Medicine

## 2011-10-19 ENCOUNTER — Encounter: Payer: Self-pay | Admitting: Internal Medicine

## 2011-10-19 ENCOUNTER — Ambulatory Visit (AMBULATORY_SURGERY_CENTER): Payer: Medicare Other | Admitting: Internal Medicine

## 2011-10-19 VITALS — BP 105/70 | HR 102 | Temp 97.3°F | Resp 2 | Ht 63.0 in | Wt 181.0 lb

## 2011-10-19 DIAGNOSIS — R1310 Dysphagia, unspecified: Secondary | ICD-10-CM

## 2011-10-19 MED ORDER — SODIUM CHLORIDE 0.9 % IV SOLN: 500.0000 mL | INTRAVENOUS | Status: DC

## 2011-10-19 NOTE — Progress Notes (Signed)
1009- pt O2 saturation decreased to 80% increased oxygen from 3lpm to 4lpm

## 2011-10-19 NOTE — Patient Instructions (Signed)
Discharge instructions given with verbal understanding.  Handouts on stricture and a dilatation diet.  Resume previous medications.  Resume coumadin tomorrow.

## 2011-10-20 ENCOUNTER — Telehealth: Payer: Self-pay | Admitting: *Deleted

## 2011-10-20 NOTE — Telephone Encounter (Signed)

## 2011-10-22 ENCOUNTER — Encounter: Payer: Medicare Other | Admitting: Internal Medicine

## 2011-10-28 LAB — HM COLONOSCOPY

## 2011-12-28 HISTORY — PX: OTHER SURGICAL HISTORY: SHX169

## 2012-06-06 ENCOUNTER — Other Ambulatory Visit: Payer: Medicare Other

## 2012-06-06 ENCOUNTER — Ambulatory Visit: Payer: Medicare Other | Admitting: Neurosurgery

## 2012-06-07 ENCOUNTER — Encounter: Payer: Self-pay | Admitting: Neurosurgery

## 2012-06-08 ENCOUNTER — Ambulatory Visit (INDEPENDENT_AMBULATORY_CARE_PROVIDER_SITE_OTHER): Payer: Medicare Other | Admitting: *Deleted

## 2012-06-08 ENCOUNTER — Encounter: Payer: Self-pay | Admitting: Neurosurgery

## 2012-06-08 ENCOUNTER — Ambulatory Visit (INDEPENDENT_AMBULATORY_CARE_PROVIDER_SITE_OTHER): Payer: Medicare Other | Admitting: Neurosurgery

## 2012-06-08 VITALS — BP 124/77 | HR 95 | Resp 16 | Ht 60.0 in | Wt 168.0 lb

## 2012-06-08 DIAGNOSIS — I6529 Occlusion and stenosis of unspecified carotid artery: Secondary | ICD-10-CM

## 2012-06-08 NOTE — Progress Notes (Addendum)
[facility] HISTORY AND PHYSICAL   CC: Annual carotid duplex Referring Physician: Hart Rochester  History of Present Illness: 78 year old female patient of Dr. Hart Rochester followed for known carotid stenosis. The patient has a left ICA occlusion. The patient denies any signs or symptoms of CVA, TIA, amaurosis fugax or any neural deficit. The patient also denies any new medical diagnoses or any recent surgery.  Past Medical History  Diagnosis Date  . Hypertension   . Heart attack 5/96  . CAD (coronary artery disease) 5/96  . Edema   . Hyperlipidemia   . Chronic atrial fibrillation     Coumadin therapy   . Osteopenia   . COPD (chronic obstructive pulmonary disease)   . Renal insufficiency   . Closed right ankle fracture   . CHF (congestive heart failure)     ROS: [x]  Positive   [ ]  Denies    General: [ ]  Weight loss, [ ]  Fever, [ ]  chills Neurologic: [ ]  Dizziness, [ ]  Blackouts, [ ]  Seizure [ ]  Stroke, [ ]  "Mini stroke", [ ]  Slurred speech, [ ]  Temporary blindness; [ ]  weakness in arms or legs, [ ]  Hoarseness Cardiac: [ ]  Chest pain/pressure, [ ]  Shortness of breath at rest [ ]  Shortness of breath with exertion, [x ] Atrial fibrillation or irregular heartbeat,x lower extremity edema Vascular: [ ]  Pain in legs with walking, [ ]  Pain in legs at rest, [ ]  Pain in legs at night,  [ ]  Non-healing ulcer, [ ]  Blood clot in vein/DVT,   Pulmonary: [ ]  Home oxygen, [ ]  Productive cough, [ ]  Coughing up blood, [ ]  Asthma,  [ ]  Wheezing Musculoskeletal:  [ ]  Arthritis, [ ]  Low back pain, [ ]  Joint pain Hematologic: [ ]  Easy Bruising, [ ]  Anemia; [ ]  Hepatitis Gastrointestinal: [ ]  Blood in stool, [ ]  Gastroesophageal Reflux/heartburn, [ ]  Trouble swallowing Urinary: [ ]  chronic Kidney disease, [ ]  on HD - [ ]  MWF or [ ]  TTHS, [ ]  Burning with urination, [ ]  Difficulty urinating Skin: [ ]  Rashes, [ ]  Wounds Psychological: [ ]  Anxiety, [ ]  Depression   Social  History History  Substance Use Topics  . Smoking status: Current Everyday Smoker -- 0.5 packs/day    Types: Cigarettes  . Smokeless tobacco: Never Used  . Alcohol Use: No    Family History Family History  Problem Relation Age of Onset  . Colon cancer Neg Hx   . Hyperlipidemia Mother   . Hypertension Mother   . Heart disease Father   . Hyperlipidemia Father   . Cancer Sister   . Hyperlipidemia Sister   . Hypertension Sister     No Known Allergies  Current Outpatient Prescriptions  Medication Sig Dispense Refill  . aliskiren (TEKTURNA) 150 MG tablet Take 150 mg by mouth daily.        Marland Kitchen atorvastatin (LIPITOR) 20 MG tablet Take 20 mg by mouth daily.        . busPIRone (BUSPAR) 15 MG tablet Take 15 mg by mouth every 8 (eight) hours as needed.        . Cholecalciferol (VITAMIN D) 2000 UNITS tablet Take 2,000 Units by mouth daily.        Marland Kitchen dexlansoprazole (DEXILANT) 60 MG capsule Take 60 mg by mouth daily.        . Fluticasone-Salmeterol (ADVAIR DISKUS) 250-50 MCG/DOSE AEPB Inhale 1 puff into the lungs 2 (two) times daily.        Marland Kitchen  furosemide (LASIX) 20 MG tablet Take 20 mg by mouth 2 (two) times daily.        Marland Kitchen olmesartan (BENICAR) 40 MG tablet Take 40 mg by mouth daily.        Marland Kitchen tiotropium (SPIRIVA HANDIHALER) 18 MCG inhalation capsule Place 18 mcg into inhaler and inhale daily.        . traMADol (ULTRAM) 50 MG tablet Take 50 mg by mouth every 6 (six) hours as needed.        . warfarin (COUMADIN) 5 MG tablet Take 5 mg by mouth daily.        Marland Kitchen diltiazem (TIAZAC) 180 MG 24 hr capsule Take 180 mg by mouth daily.        . metaxalone (SKELAXIN) 800 MG tablet Take 800 mg by mouth every 6 (six) hours as needed.          Physical Examination  Filed Vitals:   06/08/12 1553  BP: 124/77  Pulse: 95  Resp:     Body mass index is 32.81 kg/(m^2).  General:  WDWN in NAD Gait: Normal HEENT: WNL Eyes: Pupils equal Pulmonary: normal non-labored breathing , without Rales, rhonchi,   wheezing Cardiac: RRR, without  Murmurs, rubs or gallops; Abdomen: soft, NT, no masses Skin: no rashes, ulcers noted  Vascular Exam Pulses: 2+ radial pulses bilaterally Carotid bruits: Carotid pulse on the right to auscultation, known left occlusion Extremities without ischemic changes, no Gangrene , no cellulitis; no open wounds;  Musculoskeletal: no muscle wasting or atrophy   Neurologic: A&O X 3; Appropriate Affect ; SENSATION: normal; MOTOR FUNCTION:  moving all extremities equally. Speech is fluent/normal  Non-Invasive Vascular Imaging CAROTID DUPLEX 06/08/2012  Right ICA 40 - 59 % stenosis Left ICA 0ccluded stenosis   ASSESSMENT/PLAN: Asymptomatic right-sided stenosis improved from previous exam. The patient knows the signs and symptoms of CVA and knows to report to the nearest emergency room should that occur. The patient's questions were encouraged and answered. The patient will followup here in one year with repeat carotid duplex.  Lauree Chandler ANP  Clinic MD: Darrick Penna

## 2012-06-09 NOTE — Addendum Note (Signed)
Addended by: Sharee Pimple on: 06/09/2012 08:08 AM   Modules accepted: Orders

## 2012-06-12 ENCOUNTER — Ambulatory Visit: Payer: Medicare Other | Attending: Physical Medicine and Rehabilitation | Admitting: Physical Therapy

## 2012-06-12 DIAGNOSIS — R293 Abnormal posture: Secondary | ICD-10-CM | POA: Insufficient documentation

## 2012-06-12 DIAGNOSIS — M545 Low back pain, unspecified: Secondary | ICD-10-CM | POA: Insufficient documentation

## 2012-06-12 DIAGNOSIS — IMO0001 Reserved for inherently not codable concepts without codable children: Secondary | ICD-10-CM | POA: Insufficient documentation

## 2012-06-12 DIAGNOSIS — R5381 Other malaise: Secondary | ICD-10-CM | POA: Insufficient documentation

## 2012-06-14 ENCOUNTER — Ambulatory Visit: Payer: Medicare Other | Admitting: Physical Therapy

## 2012-06-14 NOTE — Procedures (Unsigned)
CAROTID DUPLEX EXAM  INDICATION:  Carotid disease  HISTORY: Diabetes:  No Cardiac:  MI Hypertension:  Yes Smoking:  Yes Previous Surgery:  No CV History:  Currently asymptomatic Amaurosis Fugax No, Paresthesias No, Hemiparesis No                                      RIGHT             LEFT Brachial systolic pressure: Brachial Doppler waveforms: Vertebral direction of flow:        Antegrade DUPLEX VELOCITIES (cm/sec) CCA peak systolic                   77 ECA peak systolic                   129 ICA peak systolic                   80 ICA end diastolic                   31 PLAQUE MORPHOLOGY:                  Calcific PLAQUE AMOUNT:                      Moderate PLAQUE LOCATION:                    ICA/ECA/CCA  IMPRESSION: 1. Doppler velocities suggest 1% to 39% stenosis of the right proximal     internal carotid artery; however, velocities may be underestimated     due to calcific plaque shadowing. 2. Velocities of the right internal carotid artery appear less than     previously recorded when compared to the previous exam on     06/04/2011. 3. Known left ICA occlusion noted.  ___________________________________________ Quita Skye Hart Rochester, M.D.  CH/MEDQ  D:  06/12/2012  T:  06/12/2012  Job:  [id number]

## 2012-06-19 ENCOUNTER — Ambulatory Visit: Payer: Medicare Other | Admitting: Physical Therapy

## 2012-06-21 ENCOUNTER — Encounter: Payer: Medicare Other | Admitting: Physical Therapy

## 2012-06-22 ENCOUNTER — Ambulatory Visit: Payer: Medicare Other | Admitting: Physical Therapy

## 2012-06-26 ENCOUNTER — Ambulatory Visit: Payer: Medicare Other | Attending: Physical Medicine and Rehabilitation | Admitting: Physical Therapy

## 2012-06-26 DIAGNOSIS — M545 Low back pain, unspecified: Secondary | ICD-10-CM | POA: Insufficient documentation

## 2012-06-26 DIAGNOSIS — R5381 Other malaise: Secondary | ICD-10-CM | POA: Insufficient documentation

## 2012-06-26 DIAGNOSIS — IMO0001 Reserved for inherently not codable concepts without codable children: Secondary | ICD-10-CM | POA: Insufficient documentation

## 2012-06-26 DIAGNOSIS — R293 Abnormal posture: Secondary | ICD-10-CM | POA: Insufficient documentation

## 2012-06-28 ENCOUNTER — Ambulatory Visit: Payer: Medicare Other | Admitting: Physical Therapy

## 2012-09-08 LAB — CBC AND DIFFERENTIAL
HCT: 52 % — AB (ref 36–46)
Hemoglobin: 16.9 g/dL — AB (ref 12.0–16.0)
Platelets: 232 10*3/uL (ref 150–399)
WBC: 10.6 10^3/mL

## 2012-11-26 LAB — HM DIABETES EYE EXAM

## 2012-12-12 LAB — LIPID PANEL
Cholesterol: 145 mg/dL (ref 0–200)
HDL: 43 mg/dL (ref 35–70)
LDL Cholesterol: 70 mg/dL
Triglycerides: 159 mg/dL (ref 40–160)

## 2013-01-18 LAB — BASIC METABOLIC PANEL
BUN: 12 mg/dL (ref 4–21)
Glucose: 104 mg/dL
Potassium: 4.6 mmol/L (ref 3.4–5.3)
Sodium: 141 mmol/L (ref 137–147)

## 2013-01-30 LAB — POCT INR: INR: 2.5 — AB (ref <=1.1)

## 2013-03-03 ENCOUNTER — Encounter: Payer: Self-pay | Admitting: *Deleted

## 2013-03-14 ENCOUNTER — Other Ambulatory Visit: Payer: Self-pay | Admitting: *Deleted

## 2013-03-14 MED ORDER — OLMESARTAN MEDOXOMIL 40 MG PO TABS: 40.0000 mg | ORAL_TABLET | Freq: Every day | ORAL | Status: DC

## 2013-03-16 ENCOUNTER — Other Ambulatory Visit: Payer: Self-pay | Admitting: Family Medicine

## 2013-03-21 ENCOUNTER — Telehealth: Payer: Self-pay | Admitting: Nurse Practitioner

## 2013-03-21 MED ORDER — ATORVASTATIN CALCIUM 20 MG PO TABS: ORAL_TABLET | ORAL | Status: DC

## 2013-03-21 NOTE — Telephone Encounter (Signed)
Patient called stating that [facility] called her and advised that they had sent over a refill request for her Lipitor in but they have not gotten a response and the patient just wanted to know what was going on.

## 2013-03-26 ENCOUNTER — Encounter: Payer: Self-pay | Admitting: Nurse Practitioner

## 2013-03-26 ENCOUNTER — Ambulatory Visit (INDEPENDENT_AMBULATORY_CARE_PROVIDER_SITE_OTHER): Payer: Medicare Other | Admitting: Nurse Practitioner

## 2013-03-26 VITALS — BP 138/84 | HR 85 | Temp 99.0°F | Ht 61.0 in | Wt 160.0 lb

## 2013-03-26 DIAGNOSIS — I10 Essential (primary) hypertension: Secondary | ICD-10-CM

## 2013-03-26 DIAGNOSIS — E785 Hyperlipidemia, unspecified: Secondary | ICD-10-CM

## 2013-03-26 DIAGNOSIS — I2510 Atherosclerotic heart disease of native coronary artery without angina pectoris: Secondary | ICD-10-CM | POA: Insufficient documentation

## 2013-03-26 DIAGNOSIS — J449 Chronic obstructive pulmonary disease, unspecified: Secondary | ICD-10-CM | POA: Insufficient documentation

## 2013-03-26 DIAGNOSIS — J441 Chronic obstructive pulmonary disease with (acute) exacerbation: Secondary | ICD-10-CM

## 2013-03-26 DIAGNOSIS — J4489 Other specified chronic obstructive pulmonary disease: Secondary | ICD-10-CM | POA: Insufficient documentation

## 2013-03-26 DIAGNOSIS — I25810 Atherosclerosis of coronary artery bypass graft(s) without angina pectoris: Secondary | ICD-10-CM

## 2013-03-26 DIAGNOSIS — M545 Low back pain, unspecified: Secondary | ICD-10-CM

## 2013-03-26 DIAGNOSIS — G8929 Other chronic pain: Secondary | ICD-10-CM

## 2013-03-26 DIAGNOSIS — I4891 Unspecified atrial fibrillation: Secondary | ICD-10-CM

## 2013-03-26 LAB — POCT INR: INR: 2.3

## 2013-03-26 NOTE — Patient Instructions (Addendum)
Continue current Dose of coumadinHealth Maintenance, Females A healthy lifestyle and preventative care can promote health and wellness.  Maintain regular health, dental, and eye exams.  Eat a healthy diet. Foods like vegetables, fruits, whole grains, low-fat dairy products, and lean protein foods contain the nutrients you need without too many calories. Decrease your intake of foods high in solid fats, added sugars, and salt. Get information about a proper diet from your caregiver, if necessary.  Regular physical exercise is one of the most important things you can do for your health. Most adults should get at least 150 minutes of moderate-intensity exercise (any activity that increases your heart rate and causes you to sweat) each week. In addition, most adults need muscle-strengthening exercises on 2 or more days a week.   Maintain a healthy weight. The body mass index (BMI) is a screening tool to identify possible weight problems. It provides an estimate of body fat based on height and weight. Your caregiver can help determine your BMI, and can help you achieve or maintain a healthy weight. For adults 20 years and older:  A BMI below 18.5 is considered underweight.  A BMI of 18.5 to 24.9 is normal.  A BMI of 25 to 29.9 is considered overweight.  A BMI of 30 and above is considered obese.  Maintain normal blood lipids and cholesterol by exercising and minimizing your intake of saturated fat. Eat a balanced diet with plenty of fruits and vegetables. Blood tests for lipids and cholesterol should begin at age 81 and be repeated every 5 years. If your lipid or cholesterol levels are high, you are over 50, or you are a high risk for heart disease, you may need your cholesterol levels checked more frequently.Ongoing high lipid and cholesterol levels should be treated with medicines if diet and exercise are not effective.  If you smoke, find out from your caregiver how to quit. If you do not use  tobacco, do not start.  If you are pregnant, do not drink alcohol. If you are breastfeeding, be very cautious about drinking alcohol. If you are not pregnant and choose to drink alcohol, do not exceed 1 drink per day. One drink is considered to be 12 ounces (355 mL) of beer, 5 ounces (148 mL) of wine, or 1.5 ounces (44 mL) of liquor.  Avoid use of street drugs. Do not share needles with anyone. Ask for help if you need support or instructions about stopping the use of drugs.  High blood pressure causes heart disease and increases the risk of stroke. Blood pressure should be checked at least every 1 to 2 years. Ongoing high blood pressure should be treated with medicines, if weight loss and exercise are not effective.  If you are 56 to 77 years old, ask your caregiver if you should take aspirin to prevent strokes.  Diabetes screening involves taking a blood sample to check your fasting blood sugar level. This should be done once every 3 years, after age 18, if you are within normal weight and without risk factors for diabetes. Testing should be considered at a younger age or be carried out more frequently if you are overweight and have at least 1 risk factor for diabetes.  Breast cancer screening is essential preventative care for women. You should practice "breast self-awareness." This means understanding the normal appearance and feel of your breasts and may include breast self-examination. Any changes detected, no matter how small, should be reported to a caregiver. Women in their 61s  and 30s should have a clinical breast exam (CBE) by a caregiver as part of a regular health exam every 1 to 3 years. After age 62, women should have a CBE every year. Starting at age 51, women should consider having a mammogram (breast X-ray) every year. Women who have a family history of breast cancer should talk to their caregiver about genetic screening. Women at a high risk of breast cancer should talk to their  caregiver about having an MRI and a mammogram every year.  The Pap test is a screening test for cervical cancer. Women should have a Pap test starting at age 28. Between ages 31 and 84, Pap tests should be repeated every 2 years. Beginning at age 19, you should have a Pap test every 3 years as long as the past 3 Pap tests have been normal. If you had a hysterectomy for a problem that was not cancer or a condition that could lead to cancer, then you no longer need Pap tests. If you are between ages 43 and 27, and you have had normal Pap tests going back 10 years, you no longer need Pap tests. If you have had past treatment for cervical cancer or a condition that could lead to cancer, you need Pap tests and screening for cancer for at least 20 years after your treatment. If Pap tests have been discontinued, risk factors (such as a new sexual partner) need to be reassessed to determine if screening should be resumed. Some women have medical problems that increase the chance of getting cervical cancer. In these cases, your caregiver may recommend more frequent screening and Pap tests.  The human papillomavirus (HPV) test is an additional test that may be used for cervical cancer screening. The HPV test looks for the virus that can cause the cell changes on the cervix. The cells collected during the Pap test can be tested for HPV. The HPV test could be used to screen women aged 65 years and older, and should be used in women of any age who have unclear Pap test results. After the age of 54, women should have HPV testing at the same frequency as a Pap test.  Colorectal cancer can be detected and often prevented. Most routine colorectal cancer screening begins at the age of 52 and continues through age 42. However, your caregiver may recommend screening at an earlier age if you have risk factors for colon cancer. On a yearly basis, your caregiver may provide home test kits to check for hidden blood in the stool. Use  of a small camera at the end of a tube, to directly examine the colon (sigmoidoscopy or colonoscopy), can detect the earliest forms of colorectal cancer. Talk to your caregiver about this at age 9, when routine screening begins. Direct examination of the colon should be repeated every 5 to 10 years through age 9, unless early forms of pre-cancerous polyps or small growths are found.  Hepatitis C blood testing is recommended for all people born from [birth year] through 1963 and any individual with known risks for hepatitis C.  Practice safe sex. Use condoms and avoid high-risk sexual practices to reduce the spread of sexually transmitted infections (STIs). Sexually active women aged 56 and younger should be checked for Chlamydia, which is a common sexually transmitted infection. Older women with new or multiple partners should also be tested for Chlamydia. Testing for other STIs is recommended if you are sexually active and at increased risk.  Osteoporosis is a  disease in which the bones lose minerals and strength with aging. This can result in serious bone fractures. The risk of osteoporosis can be identified using a bone density scan. Women ages 49 and over and women at risk for fractures or osteoporosis should discuss screening with their caregivers. Ask your caregiver whether you should be taking a calcium supplement or vitamin D to reduce the rate of osteoporosis.  Menopause can be associated with physical symptoms and risks. Hormone replacement therapy is available to decrease symptoms and risks. You should talk to your caregiver about whether hormone replacement therapy is right for you.  Use sunscreen with a sun protection factor (SPF) of 30 or greater. Apply sunscreen liberally and repeatedly throughout the day. You should seek shade when your shadow is shorter than you. Protect yourself by wearing long sleeves, pants, a wide-brimmed hat, and sunglasses year round, whenever you are outdoors.  Notify  your caregiver of new moles or changes in moles, especially if there is a change in shape or color. Also notify your caregiver if a mole is larger than the size of a pencil eraser.  Stay current with your immunizations. Document Released: 06/28/2011 Document Revised: 03/06/2012 Document Reviewed: 06/28/2011 [facility] Patient Information 2013 [place], [place].

## 2013-03-26 NOTE — Progress Notes (Signed)
Subjective:    Patient ID: [name], female    DOB: Sep 21, 1933, 79 y.o.   MRN: [id number]  Hyperlipidemia This is a chronic problem. The current episode started more than 1 year ago. The problem is controlled. Recent lipid tests were reviewed and are normal. There are no known factors aggravating her hyperlipidemia. Pertinent negatives include no chest pain, leg pain, myalgias or shortness of breath. Current antihyperlipidemic treatment includes statins. The current treatment provides significant improvement of lipids. There are no compliance problems.  Risk factors for coronary artery disease include hypertension and post-menopausal.  Hypertension This is a chronic problem. The current episode started more than 1 year ago. The problem is unchanged. The problem is controlled. Pertinent negatives include no blurred vision, chest pain, headaches, palpitations, peripheral edema or shortness of breath. There are no associated agents to hypertension. Risk factors for coronary artery disease include dyslipidemia and post-menopausal state. Past treatments include angiotensin blockers. The current treatment provides significant improvement. Compliance problems include diet and exercise.  Hypertensive end-organ damage includes CAD/MI.  Back Pain This is a chronic problem. The current episode started more than 1 year ago. The problem occurs intermittently. The problem has been waxing and waning since onset. The pain is present in the lumbar spine. The quality of the pain is described as stabbing and aching. The pain does not radiate. The pain is at a severity of 7/10. The pain is moderate. The pain is worse during the night. The symptoms are aggravated by standing. Stiffness is present in the morning. Pertinent negatives include no chest pain, headaches, leg pain, paresthesias or weakness. Risk factors include history of osteoporosis, lack of exercise, menopause, poor posture and sedentary lifestyle. Treatments  tried: pain meds. The treatment provided significant (patient on tramadol and hydrocodone. Says she alternates.) relief.   COPD Currently under good control with inhalers. Hasnt needed albuterol at all lately  Atrial Fib Doesn't see cardiologist much. On coumadin and INR is past due.   Review of Systems  Eyes: Negative for blurred vision.  Respiratory: Negative for shortness of breath.   Cardiovascular: Negative for chest pain and palpitations.  Musculoskeletal: Positive for back pain. Negative for myalgias.  Neurological: Negative for weakness, headaches and paresthesias.   No Known Allergies  Outpatient Encounter Prescriptions as of 03/26/2013  Medication Sig Dispense Refill  . alendronate (FOSAMAX) 35 MG tablet Take 35 mg by mouth every 7 (seven) days.      Marland Kitchen aliskiren (TEKTURNA) 150 MG tablet Take 150 mg by mouth daily.        Marland Kitchen atorvastatin (LIPITOR) 20 MG tablet Take 1 tablet every day  30 tablet  0  . busPIRone (BUSPAR) 15 MG tablet Take 15 mg by mouth every 8 (eight) hours as needed.        . Cholecalciferol (VITAMIN D) 2000 UNITS tablet Take 2,000 Units by mouth daily.        Marland Kitchen dexlansoprazole (DEXILANT) 60 MG capsule Take 60 mg by mouth daily.        Marland Kitchen diltiazem (TIAZAC) 180 MG 24 hr capsule Take 180 mg by mouth daily.        . Fluticasone-Salmeterol (ADVAIR DISKUS) 250-50 MCG/DOSE AEPB Inhale 1 puff into the lungs 2 (two) times daily.        . furosemide (LASIX) 20 MG tablet Take 20 mg by mouth 2 (two) times daily.        Marland Kitchen HYDROcodone-acetaminophen (NORCO/VICODIN) 5-325 MG per tablet Take 1 tablet by mouth every  8 (eight) hours as needed for pain.      . metaxalone (SKELAXIN) 800 MG tablet Take 800 mg by mouth every 6 (six) hours as needed.        Marland Kitchen olmesartan (BENICAR) 40 MG tablet Take 1 tablet (40 mg total) by mouth daily.  90 tablet  0  . potassium chloride SA (K-DUR,KLOR-CON) 20 MEQ tablet Take 20 mEq by mouth daily.      Marland Kitchen tiotropium (SPIRIVA HANDIHALER) 18 MCG  inhalation capsule Place 18 mcg into inhaler and inhale daily.        . traMADol (ULTRAM) 50 MG tablet TAKE 1 TABLET BY MOUTH THREE TIMES A DAY AS NEEDED  90 tablet  0  . warfarin (COUMADIN) 5 MG tablet Take 5 mg by mouth daily.         No facility-administered encounter medications on file as of 03/26/2013.    Past Medical History  Diagnosis Date  . Hypertension   . Heart attack 5/96  . CAD (coronary artery disease) 5/96  . Edema   . Hyperlipidemia   . Chronic atrial fibrillation     Coumadin therapy   . Osteopenia   . COPD (chronic obstructive pulmonary disease)   . Renal insufficiency   . Closed right ankle fracture   . CHF (congestive heart failure)   . LBP (low back pain)     Past Surgical History  Procedure Laterality Date  . Heart stent      X2  . Mole removed  2013    umbilicus    History   Social History  . Marital Status: Widowed    Spouse Name: N/A    Number of Children: N/A  . Years of Education: N/A   Occupational History  . retired    Social History Main Topics  . Smoking status: Current Every Day Smoker -- 0.50 packs/day    Types: Cigarettes  . Smokeless tobacco: Current User  . Alcohol Use: Yes     Comment: previous  . Drug Use: No  . Sexually Active: Not on file   Other Topics Concern  . Not on file   Social History Narrative  . No narrative on file          Objective:   Physical Exam  Constitutional: She is oriented to person, place, and time. She appears well-developed and well-nourished.  HENT:  Nose: Nose normal.  Mouth/Throat: Oropharynx is clear and moist.  Eyes: EOM are normal. Pupils are equal, round, and reactive to light.  Neck: Normal range of motion. Neck supple. No JVD present. Carotid bruit is not present. No thyromegaly present.  Cardiovascular: Normal rate and normal pulses.  An irregularly irregular rhythm present.  Pulmonary/Chest: Effort normal and breath sounds normal.  Abdominal: Soft. Bowel sounds are  normal.  Musculoskeletal: Normal range of motion.  Neurological: She is alert and oriented to person, place, and time.  Skin: Skin is warm and dry.  Psychiatric: She has a normal mood and affect. Her behavior is normal. Judgment and thought content normal.   BP 138/84  Pulse 85  Temp(Src) 99 F (37.2 C) (Oral)  Ht 5\' 1"  (1.549 m)  Wt 160 lb (72.576 kg)  BMI 30.25 kg/m2  LMP 03/27/1983 Results for orders placed in visit on 03/26/13  POCT INR      Result Value Range   INR 2.3            Assessment & Plan:  1. Essential hypertension, benign Low Na+  diet - COMPLETE METABOLIC PANEL WITH GFR  2. CAD (coronary artery disease) of artery bypass graft  3. Hyperlipidemia with target LDL less than 100 Low fat diet and exercise - NMR Lipoprofile with Lipids  4. Atrial fibrillation Continue coumadin at current dose - POCT INR  5. COPD exacerbation Continue inhalers  6. Chronic low back pain Will just try tramadol for now. Will hold off on Lortab  Labs pending Coumadin recheck in 1 month with T.Eckerd  Mary-Margaret Daphine Deutscher, [name]

## 2013-03-27 ENCOUNTER — Other Ambulatory Visit: Payer: Self-pay

## 2013-03-27 LAB — COMPLETE METABOLIC PANEL WITH GFR
ALT: <8 U/L (ref 0–35)
AST: 16 U/L (ref 0–37)
Albumin: 3.9 g/dL (ref 3.5–5.2)
Alkaline Phosphatase: 51 U/L (ref 39–117)
BUN: 13 mg/dL (ref 6–23)
CO2: 36 mEq/L — ABNORMAL HIGH (ref 19–32)
Calcium: 9.4 mg/dL (ref 8.4–10.5)
Chloride: 104 mEq/L (ref 96–112)
Creat: 1.07 mg/dL (ref 0.50–1.10)
GFR, Est African American: 58 mL/min — ABNORMAL LOW
GFR, Est Non African American: 50 mL/min — ABNORMAL LOW
Glucose, Bld: 122 mg/dL — ABNORMAL HIGH (ref 70–99)
Potassium: 4.3 mEq/L (ref 3.5–5.3)
Sodium: 143 mEq/L (ref 135–145)
Total Bilirubin: 0.5 mg/dL (ref 0.3–1.2)
Total Protein: 6.3 g/dL (ref 6.0–8.3)

## 2013-03-27 LAB — NMR LIPOPROFILE WITH LIPIDS
Cholesterol, Total: 176 mg/dL (ref <200)
HDL Particle Number: 27.4 umol/L — ABNORMAL LOW (ref >=30.5)
HDL Size: 9.2 nm (ref >=9.2)
HDL-C: 49 mg/dL (ref >=40)
LDL (calc): 96 mg/dL (ref <100)
LDL Particle Number: 1427 nmol/L — ABNORMAL HIGH (ref <1000)
LDL Size: 21.1 nm (ref >20.5)
LP-IR Score: 25 (ref <=45)
Large HDL-P: 7.7 umol/L (ref >=4.8)
Large VLDL-P: 1.4 nmol/L (ref <=2.7)
Small LDL Particle Number: 709 nmol/L — ABNORMAL HIGH (ref <=527)
Triglycerides: 157 mg/dL — ABNORMAL HIGH (ref <150)
VLDL Size: 38.7 nm (ref >=46.6)

## 2013-03-27 MED ORDER — HYDROCODONE-ACETAMINOPHEN 5-325 MG PO TABS: 1.0000 | ORAL_TABLET | Freq: Two times a day (BID) | ORAL | Status: DC

## 2013-03-27 NOTE — Telephone Encounter (Signed)
Rx not up front. Per pharmacy last filed 2/26 and pt does not have rx

## 2013-03-27 NOTE — Telephone Encounter (Signed)
Please check on this.

## 2013-03-27 NOTE — Telephone Encounter (Signed)
Last filled 02/20/13   Last seen 01/12/13   Print Rx and have nurse to call for patient to pick up

## 2013-03-27 NOTE — Telephone Encounter (Signed)
Patient aware up front  

## 2013-03-27 NOTE — Telephone Encounter (Signed)
This was done on 3/26 is it up front to be picked up?

## 2013-04-10 ENCOUNTER — Other Ambulatory Visit: Payer: Self-pay | Admitting: *Deleted

## 2013-04-10 MED ORDER — DEXLANSOPRAZOLE 60 MG PO CPDR: 60.0000 mg | DELAYED_RELEASE_CAPSULE | Freq: Every day | ORAL | Status: DC

## 2013-04-26 ENCOUNTER — Ambulatory Visit (INDEPENDENT_AMBULATORY_CARE_PROVIDER_SITE_OTHER): Payer: Medicare Other | Admitting: Pharmacist

## 2013-04-26 DIAGNOSIS — I4891 Unspecified atrial fibrillation: Secondary | ICD-10-CM

## 2013-04-26 LAB — POCT INR: INR: 2.2

## 2013-05-01 ENCOUNTER — Other Ambulatory Visit: Payer: Self-pay | Admitting: Nurse Practitioner

## 2013-05-02 ENCOUNTER — Telehealth: Payer: Self-pay | Admitting: *Deleted

## 2013-05-02 DIAGNOSIS — R1310 Dysphagia, unspecified: Secondary | ICD-10-CM

## 2013-05-02 NOTE — Telephone Encounter (Signed)
Referral done

## 2013-05-02 NOTE — Telephone Encounter (Signed)
Pt aware by VM that rx's are up front ready

## 2013-05-02 NOTE — Telephone Encounter (Signed)
PATIENT FEELS LIKE SHE OS CHOKING ON HER FOOD AND HAD THIS HAPPEN ABOUT 2 YEARS AGO AND WOULD LIKE REFERRAL BACK TO DR.PERRY.

## 2013-05-02 NOTE — Telephone Encounter (Signed)
HYDROCOD. LAST RF 03/27/13. TRAMADOL LAST RF 03/19/13. YOU TOLD NTBS ON LAST RF ON TRAMADOL. HAS APPT ON 06/25/13. PLEASE PRINT AND CALL PT TO PICKUP

## 2013-05-02 NOTE — Telephone Encounter (Signed)
rx ready for pick Up 

## 2013-05-04 ENCOUNTER — Encounter: Payer: Self-pay | Admitting: Internal Medicine

## 2013-05-10 ENCOUNTER — Other Ambulatory Visit: Payer: Self-pay | Admitting: Nurse Practitioner

## 2013-05-14 ENCOUNTER — Other Ambulatory Visit: Payer: Self-pay

## 2013-05-14 ENCOUNTER — Other Ambulatory Visit: Payer: Self-pay | Admitting: Nurse Practitioner

## 2013-05-14 NOTE — Telephone Encounter (Signed)
Last seen 03/26/13   Metoprolol not on patients med list in chart

## 2013-05-14 NOTE — Telephone Encounter (Signed)
Call patient and make sure she is taking. WHo started her on this not on her list.

## 2013-05-21 ENCOUNTER — Other Ambulatory Visit: Payer: Self-pay | Admitting: Nurse Practitioner

## 2013-05-23 MED ORDER — BUSPIRONE HCL 15 MG PO TABS: 15.0000 mg | ORAL_TABLET | Freq: Three times a day (TID) | ORAL | Status: DC | PRN

## 2013-05-23 MED ORDER — CYCLOBENZAPRINE HCL 10 MG PO TABS: 10.0000 mg | ORAL_TABLET | Freq: Three times a day (TID) | ORAL | Status: DC | PRN

## 2013-05-23 NOTE — Telephone Encounter (Signed)
Med up front - no answer at pt's ph# to let her know

## 2013-05-23 NOTE — Telephone Encounter (Signed)
Pt aware of rx at front

## 2013-05-23 NOTE — Telephone Encounter (Signed)
rx ready for pickup 

## 2013-05-23 NOTE — Telephone Encounter (Signed)
Last seen in March. Hydrocodone last filled on 03-27-13. Please advise. If approved please print rx and have pt pick up. Others can be sent electronically.

## 2013-05-28 ENCOUNTER — Other Ambulatory Visit: Payer: Self-pay

## 2013-05-28 MED ORDER — BUSPIRONE HCL 15 MG PO TABS: 15.0000 mg | ORAL_TABLET | Freq: Three times a day (TID) | ORAL | Status: DC | PRN

## 2013-05-28 MED ORDER — ATORVASTATIN CALCIUM 20 MG PO TABS: 20.0000 mg | ORAL_TABLET | Freq: Every day | ORAL | Status: DC

## 2013-05-28 MED ORDER — CYCLOBENZAPRINE HCL 10 MG PO TABS: 10.0000 mg | ORAL_TABLET | Freq: Three times a day (TID) | ORAL | Status: DC | PRN

## 2013-05-29 ENCOUNTER — Telehealth: Payer: Self-pay

## 2013-05-29 ENCOUNTER — Encounter: Payer: Self-pay | Admitting: Internal Medicine

## 2013-05-29 ENCOUNTER — Ambulatory Visit (INDEPENDENT_AMBULATORY_CARE_PROVIDER_SITE_OTHER): Payer: Medicare Other | Admitting: Internal Medicine

## 2013-05-29 VITALS — BP 138/80 | HR 88 | Ht 63.0 in | Wt 157.0 lb

## 2013-05-29 DIAGNOSIS — R1310 Dysphagia, unspecified: Secondary | ICD-10-CM

## 2013-05-29 DIAGNOSIS — K219 Gastro-esophageal reflux disease without esophagitis: Secondary | ICD-10-CM

## 2013-05-29 DIAGNOSIS — D689 Coagulation defect, unspecified: Secondary | ICD-10-CM

## 2013-05-29 DIAGNOSIS — K222 Esophageal obstruction: Secondary | ICD-10-CM

## 2013-05-29 NOTE — Patient Instructions (Addendum)
You have been scheduled for an endoscopy with dilation and propofol. Please follow written instructions given to you at your visit today.  If you use inhalers (even only as needed), please bring them with you on the day of your procedure.  Your physician has requested that you go to www.startemmi.com and enter the access code given to you at your visit today. This web site gives a general overview about your procedure. However, you should still follow specific instructions given to you by our office regarding your preparation for the procedure.  You have been given some samples of Dexilant and information on esophageal stricture and dilation

## 2013-05-29 NOTE — Telephone Encounter (Signed)
Erroneous encounter

## 2013-05-30 ENCOUNTER — Other Ambulatory Visit: Payer: Self-pay | Admitting: Nurse Practitioner

## 2013-05-30 ENCOUNTER — Encounter: Payer: Self-pay | Admitting: Internal Medicine

## 2013-05-30 NOTE — Progress Notes (Signed)
HISTORY OF PRESENT ILLNESS:  [name] is a 79 y.o. female with multiple significant medical problems including hypertension, coronary artery disease, hyperlipidemia, COPD, congestive heart failure, chronic atrial fibrillation for which she is on Coumadin. The patient was initially seen as an unassigned patient in October of 2012 for an acute meat impaction. She underwent endoscopic therapeutic removal. She was found to have esophagitis and stricturing of the esophagus with questionable extrinsic compression. Subsequent barium esophagram revealed stricture without obvious extrinsic compression. That same month she subsequently underwent upper endoscopy with esophageal dilation (balloon 15 and 16.5 mm). She has since been maintained on Dexilant therapy. She did well for about a year. However, since that time she has had recurrent intermittent solid food dysphagia with what sounds like transient food impaction. She gets regurgitation and gurgling sensation. No pain. She did come off Coumadin previously for her procedure. Her multiple chronic medical problems are stable  REVIEW OF SYSTEMS:  All non-GI ROS negative except for cough, back pain  Past Medical History  Diagnosis Date  . Hypertension   . Heart attack 5/96  . CAD (coronary artery disease) 5/96  . Edema   . Hyperlipidemia   . Chronic atrial fibrillation     Coumadin therapy   . Osteopenia   . COPD (chronic obstructive pulmonary disease)   . Renal insufficiency   . Closed right ankle fracture   . CHF (congestive heart failure)   . LBP (low back pain)     Past Surgical History  Procedure Laterality Date  . Heart stent      X2  . Mole removed  2013    umbilicus    Social History Kharter C Jech  reports that she has been smoking Cigarettes.  She has been smoking about 0.50 packs per day. She uses smokeless tobacco. She reports that  drinks alcohol. She reports that she does not use illicit drugs.  family history includes  Cancer in her sister; Heart disease in her father; Hyperlipidemia in her father, mother, and sister; and Hypertension in her mother and sister.  There is no history of Colon cancer.  No Known Allergies     PHYSICAL EXAMINATION: Vital signs: BP 138/80  Pulse 88  Ht 5\' 3"  (1.6 m)  Wt 157 lb (71.215 kg)  BMI 27.82 kg/m2  LMP 03/27/1983 General: Well-developed, well-nourished, no acute distress HEENT: Sclerae are anicteric, conjunctiva pink. Oral mucosa intact Lungs: Clear Heart: Regular Abdomen: soft, nontender, nondistended, no obvious ascites, no peritoneal signs, normal bowel sounds. No organomegaly. Extremities: No edema Psychiatric: alert and oriented x3. Cooperative   ASSESSMENT:  #1. GERD complicated by peptic stricture #2. Recurrent dysphagia secondary to peptic stricture #3. Multiple significant medical problems. On chronic Coumadin   PLAN:  #1. Reflux precautions #2. Continue PPI therapy #3. Schedule upper endoscopy with esophageal dilation.The nature of the procedure, as well as the risks, benefits, and alternatives were carefully and thoroughly reviewed with the patient. Ample time for discussion and questions allowed. The patient understood, was satisfied, and agreed to proceed. The patient is high-risk given her age and comorbidities as well as the need to address anticoagulation therapy #4. Hold Coumadin for 5 days prior to the procedure. Confirm, with her cardiologist, that this remains acceptable as previous

## 2013-06-01 ENCOUNTER — Telehealth: Payer: Self-pay

## 2013-06-01 NOTE — Telephone Encounter (Signed)
Told patient that, per Henrene Pastor at [facility], it was ok for her to hold her Coumadin for 4 days prior to her procedure.  Patient acknowledged and understood

## 2013-06-04 ENCOUNTER — Ambulatory Visit (INDEPENDENT_AMBULATORY_CARE_PROVIDER_SITE_OTHER): Payer: Medicare Other | Admitting: Pharmacist

## 2013-06-04 DIAGNOSIS — I4891 Unspecified atrial fibrillation: Secondary | ICD-10-CM

## 2013-06-04 LAB — PROTIME-INR
INR: 2.83 — ABNORMAL HIGH (ref <1.50)
Prothrombin Time: 28.5 seconds — ABNORMAL HIGH (ref 11.6–15.2)

## 2013-06-04 LAB — POCT INR: INR: 4.2

## 2013-06-04 NOTE — Patient Instructions (Signed)
Anticoagulation Dose Instructions as of 06/04/2013     [name] Tue Wed Thu Fri Sat   New Dose 5 mg 2.5 mg 5 mg 2.5 mg 5 mg 2.5 mg 5 mg    Description       No warfarin today and 1/2 tablet tomorrow.  Will send out to [facility] for confirmation and call patient with further instructions.         INR was 4.2 today

## 2013-06-08 ENCOUNTER — Other Ambulatory Visit: Payer: Self-pay | Admitting: Nurse Practitioner

## 2013-06-08 NOTE — Telephone Encounter (Signed)
Called patient regarding warfarin dosing and last INR result. Patient is scheduled to have esophagus stretched and is currently holding warfarin.  Continue as planned and restart warfarin at previous dose following procedure - next protime 06/21/2013

## 2013-06-11 NOTE — Telephone Encounter (Signed)
Error: Rx up front. Patient notified to pick up

## 2013-06-11 NOTE — Telephone Encounter (Signed)
[facility] rx ready for pickup

## 2013-06-11 NOTE — Telephone Encounter (Signed)
Called to [facility] 

## 2013-06-11 NOTE — Telephone Encounter (Signed)
Last seen 03/26/13, last filled 05/21/13.

## 2013-06-12 ENCOUNTER — Ambulatory Visit (AMBULATORY_SURGERY_CENTER): Payer: Medicare Other | Admitting: Internal Medicine

## 2013-06-12 ENCOUNTER — Encounter: Payer: Self-pay | Admitting: Internal Medicine

## 2013-06-12 VITALS — BP 110/77 | HR 100 | Temp 97.7°F | Resp 0 | Ht 63.0 in | Wt 157.0 lb

## 2013-06-12 DIAGNOSIS — K219 Gastro-esophageal reflux disease without esophagitis: Secondary | ICD-10-CM

## 2013-06-12 DIAGNOSIS — K222 Esophageal obstruction: Secondary | ICD-10-CM

## 2013-06-12 DIAGNOSIS — R1310 Dysphagia, unspecified: Secondary | ICD-10-CM

## 2013-06-12 MED ORDER — SODIUM CHLORIDE 0.9 % IV SOLN: 500.0000 mL | INTRAVENOUS | Status: DC

## 2013-06-12 NOTE — Patient Instructions (Addendum)
YOU HAD AN ENDOSCOPIC PROCEDURE TODAY AT THE [facility]: Refer to the procedure report that was given to you for any specific questions about what was found during the examination.  If the procedure report does not answer your questions, please call your gastroenterologist to clarify.  If you requested that your care partner not be given the details of your procedure findings, then the procedure report has been included in a sealed envelope for you to review at your convenience later.  YOU SHOULD EXPECT: Some feelings of bloating in the abdomen. Passage of more gas than usual.  Walking can help get rid of the air that was put into your GI tract during the procedure and reduce the bloating. If you had a lower endoscopy (such as a colonoscopy or flexible sigmoidoscopy) you may notice spotting of blood in your stool or on the toilet paper. If you underwent a bowel prep for your procedure, then you may not have a normal bowel movement for a few days.  DIET:FOLLOW DILATATION DIET GIVEN TO YOU TODAY LIQUIDS ONLY UNTIL 4PM THEN SOFT FOODS THE REST OF TODAY.   ACTIVITY: Your care partner should take you home directly after the procedure.  You should plan to take it easy, moving slowly for the rest of the day.  You can resume normal activity the day after the procedure however you should NOT DRIVE or use heavy machinery for 24 hours (because of the sedation medicines used during the test).    SYMPTOMS TO REPORT IMMEDIATELY: A gastroenterologist can be reached at any hour.  During normal business hours, 8:30 AM to 5:00 PM Monday through Friday, call [phone].  After hours and on weekends, please call the GI answering service at [phone] who will take a message and have the physician on call contact you.     Following upper endoscopy (EGD)  Vomiting of blood or coffee ground material  New chest pain or pain under the shoulder blades  Painful or persistently difficult swallowing  New  shortness of breath  Fever of 100F or higher  Black, tarry-looking stools  FOLLOW UP: If any biopsies were taken you will be contacted by phone or by letter within the next 1-3 weeks.  Call your gastroenterologist if you have not heard about the biopsies in 3 weeks.  Our staff will call the home number listed on your records the next business day following your procedure to check on you and address any questions or concerns that you may have at that time regarding the information given to you following your procedure. This is a courtesy call and so if there is no answer at the home number and we have not heard from you through the emergency physician on call, we will assume that you have returned to your regular daily activities without incident.  SIGNATURES/CONFIDENTIALITY: You and/or your care partner have signed paperwork which will be entered into your electronic medical record.  These signatures attest to the fact that that the information above on your After Visit Summary has been reviewed and is understood.  Full responsibility of the confidentiality of this discharge information lies with you and/or your care-partner.   RESUME PREVIOUS MEDICATIONS INCLUDING COUMADIN   CONTINUE DEXILANT DAILY

## 2013-06-12 NOTE — Progress Notes (Signed)
Called to room to assist during endoscopic procedure.  Patient ID and intended procedure confirmed with present staff. Received instructions for my participation in the procedure from the performing physician.  

## 2013-06-12 NOTE — Progress Notes (Signed)
Patient did not experience any of the following events: a burn prior to discharge; a fall within the facility; wrong site/side/patient/procedure/implant event; or a hospital transfer or hospital admission upon discharge from the facility. (G8907) Patient did not have preoperative order for IV antibiotic SSI prophylaxis. (G8918)  

## 2013-06-12 NOTE — Progress Notes (Signed)
Per Milford Cage CMA, Pt states she "at times feels like she has trouble breathing but then it goes away."  She states she wears oxygen at home "as needed."  Milford Cage made [facility] CRNA aware and CRNA came into admitting and assessed the pt.  CRNA states pt is ok to have procedure done.

## 2013-06-12 NOTE — Op Note (Signed)
[facility] [address] [place], [address]   ENDOSCOPY PROCEDURE REPORT  PATIENT: Katie, Melendez  MR#: [id number] BIRTHDATE: [birth year] , 79  yrs. old GENDER: Female ENDOSCOPIST: Roxy Cedar, MD REFERRED BY:  .  Self / Office PROCEDURE DATE:  06/12/2013 PROCEDURE:  EGD, diagnostic and Maloney dilation of esophagus  - 60 F ASA CLASS:     Class III INDICATIONS:  Dysphagia.   Therapeutic procedure. MEDICATIONS: MAC sedation, administered by CRNA and propofol (Diprivan) 100mg  IV TOPICAL ANESTHETIC: none  DESCRIPTION OF PROCEDURE: After the risks benefits and alternatives of the procedure were thoroughly explained, informed consent was obtained.  The LB [id number] endoscope was introduced through the mouth and advanced to the second portion of the duodenum. Without limitations.  The instrument was slowly withdrawn as the mucosa was fully examined.      EXAM: Distal esophageal rings/stricture, about 14mm.  The stomach had a few benign looking polyps.  Otherwise normal exam to D2. Retroflexed views revealed no abnormalities.     The scope was then withdrawn from the patient and the procedure completed.  THERAPY: 54 F MALONEY DILATOR PASSED W/O RESISTANCE OR HEME. TOLERATED WELL.  COMPLICATIONS: There were no complications. ENDOSCOPIC IMPRESSION: 1. Distal esophageal rings/stricture, about 14mm.  S/P DILATION 2. GERD  RECOMMENDATIONS: 1.  Clear liquids until 4 PM , then soft foods rest of day.  Resume prior diet tomorrow. 2.  Continue DEXILANT DAILY 3.  Resume previous medications, INCLUDING COUMADIN, TODAY  REPEAT EXAM:  eSigned:  Roxy Cedar, MD 06/12/2013 2:13 PM   CC:The Patient and Rudi Heap, MD

## 2013-06-13 ENCOUNTER — Encounter: Payer: Self-pay | Admitting: Neurosurgery

## 2013-06-13 ENCOUNTER — Telehealth: Payer: Self-pay

## 2013-06-13 NOTE — Telephone Encounter (Signed)
Attempt follow up call, reached recording phone is no longer in service

## 2013-06-14 ENCOUNTER — Other Ambulatory Visit: Payer: Self-pay | Admitting: Nurse Practitioner

## 2013-06-14 ENCOUNTER — Ambulatory Visit: Payer: Medicare Other | Admitting: Neurosurgery

## 2013-06-14 ENCOUNTER — Other Ambulatory Visit (INDEPENDENT_AMBULATORY_CARE_PROVIDER_SITE_OTHER): Payer: Medicaid Other | Admitting: *Deleted

## 2013-06-14 DIAGNOSIS — I6529 Occlusion and stenosis of unspecified carotid artery: Secondary | ICD-10-CM

## 2013-06-15 ENCOUNTER — Other Ambulatory Visit: Payer: Self-pay

## 2013-06-15 NOTE — Telephone Encounter (Signed)
Last filled 05/01/13  Last seen 03/26/13   If approved print and have nurse call patient to pick up

## 2013-06-18 ENCOUNTER — Other Ambulatory Visit: Payer: Self-pay | Admitting: Nurse Practitioner

## 2013-06-18 ENCOUNTER — Telehealth: Payer: Self-pay | Admitting: Pharmacist

## 2013-06-18 NOTE — Telephone Encounter (Signed)
Appt in EMR shows patient has appt for Wednesday, June 26th at 3pm to have protime rechecked. Patient notified.

## 2013-06-20 ENCOUNTER — Encounter: Payer: Self-pay | Admitting: Vascular Surgery

## 2013-06-21 ENCOUNTER — Ambulatory Visit (INDEPENDENT_AMBULATORY_CARE_PROVIDER_SITE_OTHER): Payer: Medicare Other | Admitting: Nurse Practitioner

## 2013-06-21 ENCOUNTER — Ambulatory Visit: Payer: Medicare Other | Admitting: Pharmacist

## 2013-06-21 ENCOUNTER — Encounter: Payer: Self-pay | Admitting: Nurse Practitioner

## 2013-06-21 VITALS — BP 126/79 | HR 73 | Temp 98.5°F | Ht 61.0 in | Wt 160.0 lb

## 2013-06-21 DIAGNOSIS — IMO0001 Reserved for inherently not codable concepts without codable children: Secondary | ICD-10-CM

## 2013-06-21 DIAGNOSIS — I4891 Unspecified atrial fibrillation: Secondary | ICD-10-CM

## 2013-06-21 DIAGNOSIS — J449 Chronic obstructive pulmonary disease, unspecified: Secondary | ICD-10-CM

## 2013-06-21 DIAGNOSIS — R0602 Shortness of breath: Secondary | ICD-10-CM

## 2013-06-21 LAB — POCT INR: INR: 2.8

## 2013-06-21 NOTE — Patient Instructions (Signed)
Anticoagulation Dose Instructions as of 06/21/2013     Glynis Smiles Tue Wed Thu Fri Sat   New Dose 5 mg 2.5 mg 5 mg 2.5 mg 5 mg 2.5 mg 5 mg    Description       Continue current dose of 1/2 tablet Mondays, Wednesdays and Fridays and 1 tablet all other days.         INR was 2.8 today

## 2013-06-21 NOTE — Progress Notes (Signed)
  Subjective:    Patient ID: [name], female    DOB: 01-08-1933, 79 y.o.   MRN: [id number]  Shortness of Breath This is a chronic problem. The current episode started today (Pt states she has had short periods of SOB that only last seconds and started this AM. States she is "breating twice" ). The problem occurs intermittently. The problem has been waxing and waning. Pertinent negatives include no chest pain. Nothing aggravates the symptoms. Risk factors include smoking. She has tried steroid inhalers and ipratropium inhalers for the symptoms. Her past medical history is significant for asthma and COPD.      Review of Systems  HENT: Positive for sneezing. Negative for congestion.   Respiratory: Positive for cough and shortness of breath.   Cardiovascular: Negative for chest pain.       Objective:   Physical Exam  Constitutional: She is oriented to person, place, and time. She appears well-developed and well-nourished.  Neck: Normal range of motion. Neck supple. No thyromegaly present.  Cardiovascular: Regular rhythm and normal heart sounds.   Pulmonary/Chest: Effort normal and breath sounds normal. No respiratory distress. She has no wheezes.  Pt has a nonproductive cough with diminish breath sounds bilaterally  Abdominal: Soft. Bowel sounds are normal.  Musculoskeletal: Normal range of motion. She exhibits no edema.  Neurological: She is alert and oriented to person, place, and time.  Skin: Skin is warm and dry.  Psychiatric: She has a normal mood and affect. Her behavior is normal. Judgment and thought content normal.    BP 126/79  Pulse 73  Temp(Src) 98.5 F (36.9 C) (Oral)  Ht 5\' 1"  (1.549 m)  Wt 160 lb (72.576 kg)  BMI 30.25 kg/m2  SpO2 96%  LMP 03/27/1983       Assessment & Plan:   1. COPD bronchitis   2. SOB (shortness of breath)    Since lungs are clear and O2 Sat are WNL- going to watch her over the weekend- Continue all current inhalers- rest- Keep  follow-up appointment on Monday. If worsens Over the weekend go to the ER.  Mary-Margaret Daphine Deutscher, [name]

## 2013-06-21 NOTE — Patient Instructions (Addendum)

## 2013-06-25 ENCOUNTER — Encounter: Payer: Self-pay | Admitting: Nurse Practitioner

## 2013-06-25 ENCOUNTER — Ambulatory Visit (INDEPENDENT_AMBULATORY_CARE_PROVIDER_SITE_OTHER): Payer: Medicare Other | Admitting: Nurse Practitioner

## 2013-06-25 VITALS — BP 124/84 | HR 90 | Temp 98.4°F | Ht 61.0 in | Wt 156.0 lb

## 2013-06-25 DIAGNOSIS — I4891 Unspecified atrial fibrillation: Secondary | ICD-10-CM

## 2013-06-25 DIAGNOSIS — E876 Hypokalemia: Secondary | ICD-10-CM

## 2013-06-25 DIAGNOSIS — J449 Chronic obstructive pulmonary disease, unspecified: Secondary | ICD-10-CM

## 2013-06-25 DIAGNOSIS — M545 Low back pain, unspecified: Secondary | ICD-10-CM

## 2013-06-25 DIAGNOSIS — K219 Gastro-esophageal reflux disease without esophagitis: Secondary | ICD-10-CM

## 2013-06-25 DIAGNOSIS — M810 Age-related osteoporosis without current pathological fracture: Secondary | ICD-10-CM

## 2013-06-25 DIAGNOSIS — I25810 Atherosclerosis of coronary artery bypass graft(s) without angina pectoris: Secondary | ICD-10-CM

## 2013-06-25 DIAGNOSIS — I10 Essential (primary) hypertension: Secondary | ICD-10-CM

## 2013-06-25 DIAGNOSIS — IMO0001 Reserved for inherently not codable concepts without codable children: Secondary | ICD-10-CM

## 2013-06-25 DIAGNOSIS — E785 Hyperlipidemia, unspecified: Secondary | ICD-10-CM

## 2013-06-25 DIAGNOSIS — G8929 Other chronic pain: Secondary | ICD-10-CM

## 2013-06-25 DIAGNOSIS — R7989 Other specified abnormal findings of blood chemistry: Secondary | ICD-10-CM

## 2013-06-25 LAB — COMPLETE METABOLIC PANEL WITH GFR
ALT: <8 U/L (ref 0–35)
AST: 17 U/L (ref 0–37)
Albumin: 4.1 g/dL (ref 3.5–5.2)
Alkaline Phosphatase: 53 U/L (ref 39–117)
BUN: 17 mg/dL (ref 6–23)
CO2: 31 mEq/L (ref 19–32)
Calcium: 9.5 mg/dL (ref 8.4–10.5)
Chloride: 102 mEq/L (ref 96–112)
Creat: 1.13 mg/dL — ABNORMAL HIGH (ref 0.50–1.10)
GFR, Est African American: 54 mL/min — ABNORMAL LOW
GFR, Est Non African American: 47 mL/min — ABNORMAL LOW
Glucose, Bld: 121 mg/dL — ABNORMAL HIGH (ref 70–99)
Potassium: 5 mEq/L (ref 3.5–5.3)
Sodium: 142 mEq/L (ref 135–145)
Total Bilirubin: 0.6 mg/dL (ref 0.3–1.2)
Total Protein: 6.2 g/dL (ref 6.0–8.3)

## 2013-06-25 LAB — NMR LIPOPROFILE WITH LIPIDS
Cholesterol, Total: 158 mg/dL (ref <200)
HDL Particle Number: 34.3 umol/L (ref >=30.5)
HDL Size: 9.1 nm — ABNORMAL LOW (ref >=9.2)
HDL-C: 47 mg/dL (ref >=40)
LDL (calc): 80 mg/dL (ref <100)
LDL Particle Number: 1168 nmol/L — ABNORMAL HIGH (ref <1000)
LDL Size: 21 nm (ref >20.5)
LP-IR Score: 45 (ref <=45)
Large HDL-P: 6 umol/L (ref >=4.8)
Large VLDL-P: 2.6 nmol/L (ref <=2.7)
Small LDL Particle Number: 657 nmol/L — ABNORMAL HIGH (ref <=527)
Triglycerides: 155 mg/dL — ABNORMAL HIGH (ref <150)
VLDL Size: 44.1 nm (ref <=46.6)

## 2013-06-25 MED ORDER — WARFARIN SODIUM 5 MG PO TABS: 5.0000 mg | ORAL_TABLET | Freq: Every day | ORAL | Status: DC

## 2013-06-25 MED ORDER — POTASSIUM CHLORIDE CRYS ER 20 MEQ PO TBCR: 20.0000 meq | EXTENDED_RELEASE_TABLET | Freq: Every day | ORAL | Status: DC

## 2013-06-25 MED ORDER — FLUTICASONE-SALMETEROL 250-50 MCG/DOSE IN AEPB: 1.0000 | INHALATION_SPRAY | Freq: Two times a day (BID) | RESPIRATORY_TRACT | Status: DC

## 2013-06-25 MED ORDER — DILTIAZEM HCL ER BEADS 180 MG PO CP24: 180.0000 mg | ORAL_CAPSULE | Freq: Every day | ORAL | Status: DC

## 2013-06-25 MED ORDER — OLMESARTAN MEDOXOMIL 40 MG PO TABS: 40.0000 mg | ORAL_TABLET | Freq: Every day | ORAL | Status: DC

## 2013-06-25 MED ORDER — ALENDRONATE SODIUM 35 MG PO TABS: 35.0000 mg | ORAL_TABLET | ORAL | Status: DC

## 2013-06-25 MED ORDER — METOPROLOL TARTRATE 25 MG PO TABS: 25.0000 mg | ORAL_TABLET | Freq: Every day | ORAL | Status: DC

## 2013-06-25 MED ORDER — TIOTROPIUM BROMIDE MONOHYDRATE 18 MCG IN CAPS: 18.0000 ug | ORAL_CAPSULE | Freq: Every day | RESPIRATORY_TRACT | Status: DC

## 2013-06-25 MED ORDER — ALISKIREN FUMARATE 150 MG PO TABS: 150.0000 mg | ORAL_TABLET | Freq: Every day | ORAL | Status: DC

## 2013-06-25 MED ORDER — FUROSEMIDE 20 MG PO TABS: 20.0000 mg | ORAL_TABLET | Freq: Two times a day (BID) | ORAL | Status: DC

## 2013-06-25 MED ORDER — DEXLANSOPRAZOLE 60 MG PO CPDR: 60.0000 mg | DELAYED_RELEASE_CAPSULE | Freq: Every day | ORAL | Status: DC

## 2013-06-25 MED ORDER — ATORVASTATIN CALCIUM 20 MG PO TABS: 20.0000 mg | ORAL_TABLET | Freq: Every day | ORAL | Status: DC

## 2013-06-25 NOTE — Patient Instructions (Signed)

## 2013-06-25 NOTE — Progress Notes (Signed)
Subjective:    Patient ID: [name], female    DOB: 07-22-1933, 79 y.o.   MRN: [id number]  Hypertension This is a chronic problem. The current episode started more than 1 year ago. The problem has been resolved since onset. The problem is controlled. Pertinent negatives include no chest pain, headaches, malaise/fatigue, orthopnea, palpitations, peripheral edema or shortness of breath. There are no associated agents to hypertension. Risk factors for coronary artery disease include dyslipidemia. Past treatments include angiotensin blockers, beta blockers and diuretics. The current treatment provides moderate improvement. Compliance problems include diet and exercise.   Hyperlipidemia This is a chronic problem. The current episode started more than 1 year ago. The problem is uncontrolled. Recent lipid tests were reviewed and are high. She has no history of diabetes, hypothyroidism or liver disease. There are no known factors aggravating her hyperlipidemia. Pertinent negatives include no chest pain or shortness of breath. Current antihyperlipidemic treatment includes statins. The current treatment provides mild improvement of lipids. Compliance problems include adherence to diet and adherence to exercise.  Risk factors for coronary artery disease include hypertension.  Atrial Fibrillation Presents for follow-up visit. Symptoms include hypertension. Symptoms are negative for chest pain, palpitations and shortness of breath. The symptoms have been stable. Past medical history includes atrial fibrillation and hyperlipidemia.  COPD Well controlled on spirivia and advair- has  Not needed albuterol rescue inhaler. Hypokalemia K-dur daily no c/o lower ext cramping Atrial Fib Well controlled on diltiazem- also on coumadin without any bleeding  GERD Dexilant working well to keep symptoms under control  Review of Systems  Constitutional: Negative for malaise/fatigue.  Respiratory: Negative for shortness  of breath.   Cardiovascular: Negative for chest pain, palpitations and orthopnea.  Neurological: Negative for headaches.  All other systems reviewed and are negative.       Objective:   Physical Exam  Constitutional: She is oriented to person, place, and time. She appears well-developed and well-nourished.  HENT:  Nose: Nose normal.  Mouth/Throat: Oropharynx is clear and moist.  Eyes: EOM are normal.  Neck: Trachea normal, normal range of motion and full passive range of motion without pain. Neck supple. No JVD present. Carotid bruit is not present. No thyromegaly present.  Cardiovascular: Normal rate, regular rhythm, normal heart sounds and intact distal pulses.  Exam reveals no gallop and no friction rub.   No murmur heard. Pulmonary/Chest: Effort normal and breath sounds normal.  Abdominal: Soft. Bowel sounds are normal. She exhibits no distension and no mass. There is no tenderness.  Musculoskeletal: Normal range of motion.  Lymphadenopathy:    She has no cervical adenopathy.  Neurological: She is alert and oriented to person, place, and time. She has normal reflexes.  Skin: Skin is warm and dry.  Psychiatric: She has a normal mood and affect. Her behavior is normal. Judgment and thought content normal.   BP 124/84  Pulse 90  Temp(Src) 98.4 F (36.9 C) (Oral)  Ht 5\' 1"  (1.549 m)  Wt 156 lb (70.761 kg)  BMI 29.49 kg/m2  LMP 03/27/1983        Assessment & Plan:   1. Essential hypertension, benign   2. Hyperlipidemia with target LDL less than 100   3. COPD bronchitis   4. A-fib   5. CAD (coronary artery disease) of artery bypass graft   6. Chronic low back pain   7. GERD (gastroesophageal reflux disease)    Orders Placed This Encounter  Procedures  . COMPLETE METABOLIC PANEL WITH GFR  .  NMR Lipoprofile with Lipids   Outpatient Encounter Prescriptions as of 06/25/2013  Medication Sig Dispense Refill  . alendronate (FOSAMAX) 35 MG tablet Take 35 mg by mouth  every 7 (seven) days.      Marland Kitchen aliskiren (TEKTURNA) 150 MG tablet Take 150 mg by mouth daily.        Marland Kitchen atorvastatin (LIPITOR) 20 MG tablet TAKE 1 TABLET EVERY DAY  30 tablet  2  . busPIRone (BUSPAR) 15 MG tablet Take 1 tablet (15 mg total) by mouth every 8 (eight) hours as needed.  30 tablet  2  . Cholecalciferol (VITAMIN D) 2000 UNITS tablet Take 2,000 Units by mouth daily.        . cyclobenzaprine (FLEXERIL) 10 MG tablet Take 1 tablet (10 mg total) by mouth 3 (three) times daily as needed for muscle spasms.  90 tablet  0  . dexlansoprazole (DEXILANT) 60 MG capsule Take 1 capsule (60 mg total) by mouth daily.  30 capsule  2  . diltiazem (TIAZAC) 180 MG 24 hr capsule Take 180 mg by mouth daily.        . Fluticasone-Salmeterol (ADVAIR DISKUS) 250-50 MCG/DOSE AEPB Inhale 1 puff into the lungs 2 (two) times daily.        . furosemide (LASIX) 20 MG tablet Take 20 mg by mouth 2 (two) times daily.        Marland Kitchen HYDROcodone-acetaminophen (NORCO/VICODIN) 5-325 MG per tablet TAKE 1 TABLET BY MOUTH TWICE DAILY  60 tablet  0  . metoprolol tartrate (LOPRESSOR) 25 MG tablet TAKE 1 TABLET EVERY DAY  30 tablet  5  . olmesartan (BENICAR) 40 MG tablet Take 1 tablet (40 mg total) by mouth daily.  90 tablet  0  . potassium chloride SA (K-DUR,KLOR-CON) 20 MEQ tablet Take 20 mEq by mouth daily.      Marland Kitchen tiotropium (SPIRIVA HANDIHALER) 18 MCG inhalation capsule Place 18 mcg into inhaler and inhale daily.        . traMADol (ULTRAM) 50 MG tablet TAKE 1 TABLET BY MOUTH THREE TIMES DAILY AS NEEDED  90 tablet  0  . warfarin (COUMADIN) 5 MG tablet Take 5 mg by mouth daily.         No facility-administered encounter medications on file as of 06/25/2013.   Continue all meds Labs pending Low fat diet discussed Exercise when can Follow up in 3 months  Mary-Margaret Daphine Deutscher, [name]

## 2013-07-05 LAB — POCT GLYCOSYLATED HEMOGLOBIN (HGB A1C): Hemoglobin A1C: 6.2

## 2013-07-05 NOTE — Addendum Note (Signed)
Addended by: Prescott Gum on: 07/05/2013 09:34 AM   Modules accepted: Orders

## 2013-07-10 ENCOUNTER — Ambulatory Visit (INDEPENDENT_AMBULATORY_CARE_PROVIDER_SITE_OTHER): Payer: Medicare Other | Admitting: Pharmacist Clinician (PhC)/ Clinical Pharmacy Specialist

## 2013-07-10 DIAGNOSIS — I4891 Unspecified atrial fibrillation: Secondary | ICD-10-CM

## 2013-07-10 LAB — POCT INR: INR: 2.1

## 2013-07-26 ENCOUNTER — Ambulatory Visit (INDEPENDENT_AMBULATORY_CARE_PROVIDER_SITE_OTHER): Payer: Medicare Other

## 2013-07-26 ENCOUNTER — Ambulatory Visit: Payer: Medicare Other | Admitting: Family Medicine

## 2013-07-26 VITALS — BP 124/72 | HR 76 | Temp 97.4°F | Ht 61.0 in | Wt 156.0 lb

## 2013-07-26 DIAGNOSIS — S52532S Colles' fracture of left radius, sequela: Secondary | ICD-10-CM

## 2013-07-26 DIAGNOSIS — M25539 Pain in unspecified wrist: Secondary | ICD-10-CM

## 2013-07-26 DIAGNOSIS — M25532 Pain in left wrist: Secondary | ICD-10-CM

## 2013-07-26 NOTE — Patient Instructions (Addendum)
Radial Fracture You have a broken bone (fracture) of the forearm. This is the part of your arm between the elbow and your wrist. Your forearm is made up of two bones. These are the radius and ulna. Your fracture is in the radial shaft. This is the bone in your forearm located on the thumb side. A cast or splint is used to protect and keep your injured bone from moving. The cast or splint will be on generally for about 5 to 6 weeks, with individual variations. HOME CARE INSTRUCTIONS   Keep the injured part elevated while sitting or lying down. Keep the injury above the level of your heart (the center of the chest). This will decrease swelling and pain.  Apply ice to the injury for 15-20 minutes, 3-4 times per day while awake, for 2 days. Put the ice in a plastic bag and place a towel between the bag of ice and your cast or splint.  Move your fingers to avoid stiffness and minimize swelling.  If you have a plaster or fiberglass cast:  Do not try to scratch the skin under the cast using sharp or pointed objects.  Check the skin around the cast every day. You may put lotion on any red or sore areas.  Keep your cast dry and clean.  If you have a plaster splint:  Wear the splint as directed.  You may loosen the elastic around the splint if your fingers become numb, tingle, or turn cold or blue.  Do not put pressure on any part of your cast or splint. It may break. Rest your cast only on a pillow for the first 24 hours until it is fully hardened.  Your cast or splint can be protected during bathing with a plastic bag. Do not lower the cast or splint into water.  Only take over-the-counter or prescription medicines for pain, discomfort, or fever as directed by your caregiver. SEEK IMMEDIATE MEDICAL CARE IF:   Your cast gets damaged or breaks.  You have more severe pain or swelling than you did before getting the cast.  You have severe pain when stretching your fingers.  There is a bad  smell, new stains and/or pus-like (purulent) drainage coming from under the cast.  Your fingers or hand turn pale or blue and become cold or your loose feeling. Document Released: 05/26/2006 Document Revised: 03/06/2012 Document Reviewed: 08/22/2006 [facility] Patient Information 2014 [place], [place]. Colles Fracture A Colles fracture is a type of broken wrist. It means the radius bone is broken (fractured).  The radius is the bone of your forearm on the thumb side. The forearm is the part of your arm between the elbow and your wrist. Your forearm is made up of two bones. These are the radius and ulna. Often when the radius is broken, the ulna may also be broken. A cast or splint is used to protect and keep your injured bone from moving. The cast or splint will be on generally for about 5 to 6 weeks. SYMPTOMS  The usual problems are pain, swelling, and bruising. DIAGNOSIS  The diagnosis of this injury is usually made with X-rays. TREATMENT  Generally the fracture is held in place with a cast until it is healed. In a healthy person the casting lasts about 4 to 6 weeks but will depend on age and other factors. HOME CARE INSTRUCTIONS   Keep the injured part elevated while sitting or lying down. Keep the injury above the level of your heart (the  center of the chest). This will decrease swelling and pain.  Apply ice to the injury for 15-20 minutes, 3-4 times per day while awake, for 2 days. Put the ice in a plastic bag and place a thin towel between the bag of ice and your cast or splint.  If you have a plaster or fiberglass cast:  Do not try to scratch the skin under the cast using sharp or pointed objects.  Check the skin around the cast every day. You may put lotion on any red or sore areas.  Keep your cast dry and clean.  If you have a plaster splint:  Wear the splint as directed.  You may loosen the elastic around the splint if your fingers become numb, tingle, or turn cold or  blue.  Do not put pressure on any part of your cast or splint. It may break. Rest your cast only on a pillow the first 24 hours until it is fully hardened.  Your cast or splint can be protected during bathing with a plastic bag. Do not lower the cast or splint into water.  Only take over-the-counter or prescription medicines for pain, discomfort, or fever as directed by your caregiver. SEEK IMMEDIATE MEDICAL CARE IF:   Your cast gets damaged or breaks.  You have more severe pain or swelling than you did before the cast.  Your skin or nails below the injury turn blue or gray, or feel cold or numb.  There is a bad smell or new stains and/or pus like (purulent) drainage coming from under the cast. MAKE SURE YOU:   Understand these instructions.  Will watch your condition.  Will get help right away if you are not doing well or get worse. Document Released: 12/29/2006 Document Revised: 03/06/2012 Document Reviewed: 01/24/2007 [facility] Patient Information 2014 [place], [place].

## 2013-07-26 NOTE — Progress Notes (Signed)
  Subjective:    Patient ID: [name], female    DOB: 1933-11-06, 79 y.o.   MRN: [id number]  HPI  This 79 y.o. female presents for evaluation of left wrist injury today when she tripped And fell.  She states she landed on her left hand and it was flexed all the way against the ventral Aspect of her left wrist.  She is having to support her left wrist with her right hand.  Review of Systems C/o left wrist pain.   No chest pain, SOB, HA, dizziness, vision change, N/V, diarrhea, constipation, dysuria, urinary urgency or frequency or rash.  Objective:   Physical Exam Vital signs noted  Elderly female holding her left wrist with her right hand.  HEENT - Head atraumatic Normocephalic                Throat - oropharanx wnl Respiratory - Lungs CTA bilateral Cardiac - RRR S1 and S2 without murmur. MS - TTP distal radius.  Xray - Left wrist - Distal left radial fracture nondisplaced.      Assessment & Plan:  Left wrist pain - Plan: DG Wrist Complete Left, Referral to Orthopedics  Colles' fracture of left radius, sequela Referral to orthopedics, Cock up splint left wrist.

## 2013-08-12 ENCOUNTER — Other Ambulatory Visit: Payer: Self-pay | Admitting: Nurse Practitioner

## 2013-08-14 ENCOUNTER — Other Ambulatory Visit: Payer: Self-pay | Admitting: *Deleted

## 2013-08-14 MED ORDER — HYDROCODONE-ACETAMINOPHEN 5-325 MG PO TABS: ORAL_TABLET | ORAL | Status: DC

## 2013-08-14 NOTE — Telephone Encounter (Signed)
Rx up front. Patient notified 

## 2013-08-14 NOTE — Telephone Encounter (Signed)
LAST OV 06/25/13. LAST RF 07/09/13. PLEASE PRINT AND CALL PT IF APPROVED.

## 2013-08-14 NOTE — Telephone Encounter (Signed)
Last seen 06/25/13  MMM  If approved print and route to nurse

## 2013-08-14 NOTE — Telephone Encounter (Signed)
rx ready for pickup 

## 2013-08-17 ENCOUNTER — Ambulatory Visit (INDEPENDENT_AMBULATORY_CARE_PROVIDER_SITE_OTHER): Payer: Medicare Other | Admitting: Physician Assistant

## 2013-08-17 ENCOUNTER — Ambulatory Visit (INDEPENDENT_AMBULATORY_CARE_PROVIDER_SITE_OTHER): Payer: Medicare Other

## 2013-08-17 ENCOUNTER — Encounter: Payer: Self-pay | Admitting: Physician Assistant

## 2013-08-17 VITALS — BP 127/80 | HR 101 | Temp 97.9°F | Ht 61.0 in | Wt 156.0 lb

## 2013-08-17 DIAGNOSIS — IMO0002 Reserved for concepts with insufficient information to code with codable children: Secondary | ICD-10-CM

## 2013-08-17 DIAGNOSIS — T148XXA Other injury of unspecified body region, initial encounter: Secondary | ICD-10-CM

## 2013-08-17 DIAGNOSIS — M25532 Pain in left wrist: Secondary | ICD-10-CM

## 2013-08-17 DIAGNOSIS — M25539 Pain in unspecified wrist: Secondary | ICD-10-CM

## 2013-08-17 MED ORDER — MUPIROCIN 2 % EX OINT: TOPICAL_OINTMENT | Freq: Three times a day (TID) | CUTANEOUS | Status: DC

## 2013-08-27 NOTE — Progress Notes (Signed)
  Subjective:    Patient ID: [name], female    DOB: [birth year], 79 y.o.   MRN: [id number]  HPI 79 y/o female presents for R wrist pain following a fall x 2 days ago s/p a fracture on the same wrist several weeks ago. She states that she visited [facility] yesterday and was told that the fracture was not healing as expected. She has been wearing a wrist splint for protection instead of casting at patient's preference.     Review of Systems  Musculoskeletal:       L wrist and forearm edema. TTP. Reduced active ROM. No visible wound on hand or wrist.   Skin: Positive for wound (abrasion and contusion on L elbow due to fall. ).       Objective:   Physical Exam  Constitutional: She appears well-developed and well-nourished.  HENT:  Head: Normocephalic and atraumatic.  Musculoskeletal: She exhibits edema (swelling of L wrist and distal forearm) and tenderness (TTP of R forearm and wrist).          Assessment & Plan:  Xray of wrist indicates increasingly displaced fracture when compared to original films at time of break. The xray tech and myself called [facility] and scheduled an appt for her to be seen there on Monday at 2:00. Until then, patient will continue to wear splint and advised to refrain in any activities that could cause further harm.

## 2013-08-28 ENCOUNTER — Ambulatory Visit (INDEPENDENT_AMBULATORY_CARE_PROVIDER_SITE_OTHER): Payer: Medicare Other | Admitting: Pharmacist Clinician (PhC)/ Clinical Pharmacy Specialist

## 2013-08-28 DIAGNOSIS — I4891 Unspecified atrial fibrillation: Secondary | ICD-10-CM

## 2013-08-28 LAB — POCT INR: INR: 1.5

## 2013-09-11 ENCOUNTER — Other Ambulatory Visit: Payer: Self-pay | Admitting: Nurse Practitioner

## 2013-09-11 ENCOUNTER — Ambulatory Visit (INDEPENDENT_AMBULATORY_CARE_PROVIDER_SITE_OTHER): Payer: Medicare Other | Admitting: Pharmacist Clinician (PhC)/ Clinical Pharmacy Specialist

## 2013-09-11 ENCOUNTER — Ambulatory Visit (INDEPENDENT_AMBULATORY_CARE_PROVIDER_SITE_OTHER): Payer: Medicare Other

## 2013-09-11 ENCOUNTER — Other Ambulatory Visit: Payer: Self-pay | Admitting: *Deleted

## 2013-09-11 ENCOUNTER — Other Ambulatory Visit: Payer: Self-pay | Admitting: Family Medicine

## 2013-09-11 DIAGNOSIS — I10 Essential (primary) hypertension: Secondary | ICD-10-CM

## 2013-09-11 DIAGNOSIS — I4891 Unspecified atrial fibrillation: Secondary | ICD-10-CM

## 2013-09-11 DIAGNOSIS — R9389 Abnormal findings on diagnostic imaging of other specified body structures: Secondary | ICD-10-CM

## 2013-09-11 DIAGNOSIS — F17200 Nicotine dependence, unspecified, uncomplicated: Secondary | ICD-10-CM

## 2013-09-11 DIAGNOSIS — E785 Hyperlipidemia, unspecified: Secondary | ICD-10-CM

## 2013-09-11 DIAGNOSIS — R5381 Other malaise: Secondary | ICD-10-CM

## 2013-09-11 DIAGNOSIS — Z720 Tobacco use: Secondary | ICD-10-CM

## 2013-09-11 LAB — POCT INR: INR: 1.4

## 2013-09-11 MED ORDER — WARFARIN SODIUM 5 MG PO TABS: 5.0000 mg | ORAL_TABLET | Freq: Every day | ORAL | Status: DC

## 2013-09-13 LAB — CBC WITH DIFFERENTIAL/PLATELET
Basophils Absolute: 0 10*3/uL (ref 0.0–0.2)
Basos: 0 %
Eos: 1 %
Eosinophils Absolute: 0.1 10*3/uL (ref 0.0–0.4)
HCT: 48.3 % — ABNORMAL HIGH (ref 34.0–46.6)
Hemoglobin: 16.5 g/dL — ABNORMAL HIGH (ref 11.1–15.9)
Immature Grans (Abs): 0 10*3/uL (ref 0.0–0.1)
Immature Granulocytes: 0 %
Lymphocytes Absolute: 1.2 10*3/uL (ref 0.7–3.1)
Lymphs: 11 %
MCH: 31.4 pg (ref 26.6–33.0)
MCHC: 34.2 g/dL (ref 31.5–35.7)
MCV: 92 fL (ref 79–97)
Monocytes Absolute: 0.7 10*3/uL (ref 0.1–0.9)
Monocytes: 7 %
Neutrophils Absolute: 8.6 10*3/uL — ABNORMAL HIGH (ref 1.4–7.0)
Neutrophils Relative %: 81 %
RBC: 5.26 x10E6/uL (ref 3.77–5.28)
RDW: 13.5 % (ref 12.3–15.4)
WBC: 10.7 10*3/uL (ref 3.4–10.8)

## 2013-09-13 LAB — NMR, LIPOPROFILE
Cholesterol: 164 mg/dL (ref <200)
HDL Cholesterol by NMR: 50 mg/dL (ref >=40)
HDL Particle Number: 34.2 umol/L (ref >=30.5)
LDL Particle Number: 1353 nmol/L — ABNORMAL HIGH (ref <1000)
LDL Size: 21.3 nm (ref >20.5)
LDLC SERPL CALC-MCNC: 82 mg/dL (ref <100)
LP-IR Score: 35 (ref <=45)
Small LDL Particle Number: 471 nmol/L (ref <=527)
Triglycerides by NMR: 162 mg/dL — ABNORMAL HIGH (ref <150)

## 2013-09-13 LAB — BASIC METABOLIC PANEL
BUN/Creatinine Ratio: 10 — ABNORMAL LOW (ref 11–26)
BUN: 11 mg/dL (ref 8–27)
CO2: 26 mmol/L (ref 18–29)
Calcium: 9.4 mg/dL (ref 8.6–10.2)
Chloride: 98 mmol/L (ref 97–108)
Creatinine, Ser: 1.08 mg/dL — ABNORMAL HIGH (ref 0.57–1.00)
GFR calc Af Amer: 57 mL/min/{1.73_m2} — ABNORMAL LOW (ref >59)
GFR calc non Af Amer: 49 mL/min/{1.73_m2} — ABNORMAL LOW (ref >59)
Glucose: 113 mg/dL — ABNORMAL HIGH (ref 65–99)
Potassium: 4.4 mmol/L (ref 3.5–5.2)
Sodium: 142 mmol/L (ref 134–144)

## 2013-09-13 LAB — HEPATIC FUNCTION PANEL
ALT: 6 IU/L (ref 0–32)
AST: 16 IU/L (ref 0–40)
Albumin: 4.2 g/dL (ref 3.5–4.8)
Alkaline Phosphatase: 57 IU/L (ref 39–117)
Bilirubin, Direct: 0.15 mg/dL (ref 0.00–0.40)
Total Bilirubin: 0.5 mg/dL (ref 0.0–1.2)
Total Protein: 6.1 g/dL (ref 6.0–8.5)

## 2013-09-13 LAB — VITAMIN D 25 HYDROXY (VIT D DEFICIENCY, FRACTURES): Vit D, 25-Hydroxy: 33.6 ng/mL (ref 30.0–100.0)

## 2013-09-17 ENCOUNTER — Other Ambulatory Visit: Payer: Self-pay | Admitting: Nurse Practitioner

## 2013-09-17 ENCOUNTER — Other Ambulatory Visit: Payer: Self-pay | Admitting: Family Medicine

## 2013-09-18 ENCOUNTER — Ambulatory Visit (INDEPENDENT_AMBULATORY_CARE_PROVIDER_SITE_OTHER): Payer: Medicare Other

## 2013-09-18 DIAGNOSIS — E559 Vitamin D deficiency, unspecified: Secondary | ICD-10-CM

## 2013-09-18 DIAGNOSIS — E039 Hypothyroidism, unspecified: Secondary | ICD-10-CM

## 2013-09-18 DIAGNOSIS — I10 Essential (primary) hypertension: Secondary | ICD-10-CM

## 2013-09-18 DIAGNOSIS — Z23 Encounter for immunization: Secondary | ICD-10-CM

## 2013-09-18 DIAGNOSIS — I4891 Unspecified atrial fibrillation: Secondary | ICD-10-CM

## 2013-09-18 DIAGNOSIS — R5381 Other malaise: Secondary | ICD-10-CM

## 2013-09-18 DIAGNOSIS — E785 Hyperlipidemia, unspecified: Secondary | ICD-10-CM

## 2013-09-18 LAB — POCT INR: INR: 2.5

## 2013-09-18 NOTE — Telephone Encounter (Signed)
rx ready for pickup 

## 2013-09-18 NOTE — Telephone Encounter (Signed)
last filled 08/14/13, last seen 08/17/13, will print

## 2013-09-20 NOTE — Telephone Encounter (Signed)
Up front 

## 2013-09-21 ENCOUNTER — Other Ambulatory Visit: Payer: Self-pay | Admitting: Nurse Practitioner

## 2013-09-25 ENCOUNTER — Telehealth: Payer: Self-pay | Admitting: Nurse Practitioner

## 2013-09-27 ENCOUNTER — Ambulatory Visit: Payer: Medicare Other | Admitting: General Practice

## 2013-09-27 NOTE — Telephone Encounter (Signed)
Called patient no answer has appointment tom will address then

## 2013-09-27 NOTE — Telephone Encounter (Signed)
Patient has appointment tomorrow will discuss then

## 2013-09-27 NOTE — Telephone Encounter (Signed)
Did you order labs on this patient?

## 2013-09-28 ENCOUNTER — Ambulatory Visit (INDEPENDENT_AMBULATORY_CARE_PROVIDER_SITE_OTHER): Payer: Medicare Other | Admitting: Nurse Practitioner

## 2013-09-28 ENCOUNTER — Encounter: Payer: Self-pay | Admitting: Nurse Practitioner

## 2013-09-28 ENCOUNTER — Other Ambulatory Visit: Payer: Self-pay | Admitting: Nurse Practitioner

## 2013-09-28 VITALS — BP 143/93 | HR 126 | Temp 96.7°F | Ht 61.0 in | Wt 156.0 lb

## 2013-09-28 DIAGNOSIS — E785 Hyperlipidemia, unspecified: Secondary | ICD-10-CM

## 2013-09-28 DIAGNOSIS — I25810 Atherosclerosis of coronary artery bypass graft(s) without angina pectoris: Secondary | ICD-10-CM

## 2013-09-28 DIAGNOSIS — I10 Essential (primary) hypertension: Secondary | ICD-10-CM

## 2013-09-28 DIAGNOSIS — I4891 Unspecified atrial fibrillation: Secondary | ICD-10-CM

## 2013-09-28 LAB — POCT INR: INR: 5.1

## 2013-09-28 MED ORDER — METOPROLOL TARTRATE 50 MG PO TABS: 50.0000 mg | ORAL_TABLET | Freq: Two times a day (BID) | ORAL | Status: DC

## 2013-09-28 NOTE — Progress Notes (Signed)
Subjective:    Patient ID: [name], female    DOB: 09/23/1933, 80 y.o.   MRN: [id number]  Hypertension This is a chronic problem. The current episode started more than 1 year ago. The problem has been waxing and waning since onset. The problem is uncontrolled. Associated symptoms include shortness of breath. Pertinent negatives include no anxiety, chest pain, headaches, malaise/fatigue, orthopnea, palpitations or peripheral edema. There are no associated agents to hypertension. Risk factors for coronary artery disease include dyslipidemia, post-menopausal state, sedentary lifestyle and smoking/tobacco exposure. Past treatments include angiotensin blockers, beta blockers and diuretics. The current treatment provides moderate improvement. Compliance problems include diet and exercise.  There is no history of a thyroid problem.  Hyperlipidemia This is a chronic problem. The current episode started more than 1 year ago. The problem is uncontrolled. Recent lipid tests were reviewed and are high. She has no history of diabetes, hypothyroidism or liver disease. Factors aggravating her hyperlipidemia include smoking and beta blockers. Associated symptoms include shortness of breath. Pertinent negatives include no chest pain. Current antihyperlipidemic treatment includes statins. The current treatment provides mild improvement of lipids. Compliance problems include adherence to diet and adherence to exercise.  Risk factors for coronary artery disease include hypertension, dyslipidemia, family history and post-menopausal.  Atrial Fibrillation Presents for follow-up visit. Symptoms include hypertension, shortness of breath and tachycardia. Symptoms are negative for chest pain and palpitations. The symptoms have been stable. Past treatments include warfarin and beta blockers. Past medical history includes atrial fibrillation and hyperlipidemia.  Gastrophageal Reflux She complains of a hoarse voice. She reports  no chest pain, no coughing, no heartburn or no nausea. This is a chronic problem. The current episode started more than 1 year ago. The problem occurs rarely. The problem has been resolved. The symptoms are aggravated by certain foods. Risk factors include smoking/tobacco exposure. She has tried a PPI for the symptoms. The treatment provided significant relief.  COPD Well controlled on spirivia and advair- has  Not needed albuterol rescue inhaler. Hypokalemia K-dur daily no c/o lower ext cramping Atrial Fib Well controlled on diltiazem- also on coumadin without any bleeding    Review of Systems  Constitutional: Negative for malaise/fatigue.  HENT: Positive for hoarse voice.   Respiratory: Positive for shortness of breath. Negative for cough.   Cardiovascular: Negative for chest pain, palpitations and orthopnea.  Gastrointestinal: Negative for heartburn and nausea.  Neurological: Negative for headaches.  All other systems reviewed and are negative.       Objective:   Physical Exam  Vitals reviewed. Constitutional: She is oriented to person, place, and time. She appears well-developed and well-nourished.  HENT:  Nose: Nose normal.  Mouth/Throat: Oropharynx is clear and moist.  Eyes: EOM are normal.  Neck: Trachea normal, normal range of motion and full passive range of motion without pain. Neck supple. No JVD present. Carotid bruit is not present. No thyromegaly present.  Cardiovascular: Regular rhythm, normal heart sounds and intact distal pulses.  Exam reveals no gallop and no friction rub.   No murmur heard. Tachycardia   Pulmonary/Chest: Effort normal and breath sounds normal.  Abdominal: Soft. Bowel sounds are normal. She exhibits no distension and no mass. There is no tenderness.  Musculoskeletal: Normal range of motion.  Lymphadenopathy:    She has no cervical adenopathy.  Neurological: She is alert and oriented to person, place, and time. She has normal reflexes.  Skin:  Skin is warm and dry.  Psychiatric: She has a normal mood and   affect. Her behavior is normal. Judgment and thought content normal.   BP 143/93  Pulse 126  Temp(Src) 96.7 F (35.9 C) (Oral)  Ht 5' 1" (1.549 m)  Wt 156 lb (70.761 kg)  BMI 29.49 kg/m2  LMP 03/27/1983        Assessment & Plan:   1. Essential hypertension, benign   2. Atrial fibrillation   3. CAD (coronary artery disease) of artery bypass graft   4. Hyperlipidemia with target LDL less than 100    Orders Placed This Encounter  Procedures  . CMP14+EGFR  . NMR, lipoprofile   Meds ordered this encounter  Medications  . metoprolol (LOPRESSOR) 50 MG tablet    Sig: Take 1 tablet (50 mg total) by mouth 2 (two) times daily.    Dispense:  60 tablet    Refill:  5    Order Specific Question:  Supervising Provider    Answer:  MOORE, DONALD W [[id number]]   Stopped tekturna and changed metoprolol to 50 mg BID- for heart rate and blood pressure Continue all meds Labs pending Diet and exercise encouraged Health maintenance reviewed Follow up in 2 weeks Discussed CT scan results with patient  Mary-Margaret Morgann Woodburn, [name]   

## 2013-09-28 NOTE — Patient Instructions (Addendum)
Anticoagulation Dose Instructions as of 09/28/2013     Glynis Smiles Tue Wed Thu Fri Sat   New Dose 5 mg 5 mg 5 mg 5 mg 5 mg 0 mg 0 mg    Description       Hold today and tomorrow then back on 1 a day- repeat monday

## 2013-09-29 LAB — NMR, LIPOPROFILE
Cholesterol: 166 mg/dL (ref <200)
HDL Cholesterol by NMR: 53 mg/dL (ref >=40)
HDL Particle Number: 31.8 umol/L (ref >=30.5)
LDL Particle Number: 1328 nmol/L — ABNORMAL HIGH (ref <1000)
LDL Size: 21.3 nm (ref >20.5)
LDLC SERPL CALC-MCNC: 78 mg/dL (ref <100)
LP-IR Score: 32 (ref <=45)
Small LDL Particle Number: 116 nmol/L (ref <=527)
Triglycerides by NMR: 174 mg/dL — ABNORMAL HIGH (ref <150)

## 2013-09-29 LAB — CMP14+EGFR
ALT: 10 IU/L (ref 0–32)
AST: 18 IU/L (ref 0–40)
Albumin/Globulin Ratio: 2.2 (ref 1.1–2.5)
Albumin: 4.2 g/dL (ref 3.5–4.8)
Alkaline Phosphatase: 59 IU/L (ref 39–117)
BUN/Creatinine Ratio: 9 — ABNORMAL LOW (ref 11–26)
BUN: 11 mg/dL (ref 8–27)
CO2: 27 mmol/L (ref 18–29)
Calcium: 10 mg/dL (ref 8.6–10.2)
Chloride: 101 mmol/L (ref 97–108)
Creatinine, Ser: 1.19 mg/dL — ABNORMAL HIGH (ref 0.57–1.00)
GFR calc Af Amer: 51 mL/min/{1.73_m2} — ABNORMAL LOW (ref >59)
GFR calc non Af Amer: 44 mL/min/{1.73_m2} — ABNORMAL LOW (ref >59)
Globulin, Total: 1.9 g/dL (ref 1.5–4.5)
Glucose: 106 mg/dL — ABNORMAL HIGH (ref 65–99)
Potassium: 5.3 mmol/L — ABNORMAL HIGH (ref 3.5–5.2)
Sodium: 145 mmol/L — ABNORMAL HIGH (ref 134–144)
Total Bilirubin: 0.3 mg/dL (ref 0.0–1.2)
Total Protein: 6.1 g/dL (ref 6.0–8.5)

## 2013-10-01 ENCOUNTER — Ambulatory Visit (INDEPENDENT_AMBULATORY_CARE_PROVIDER_SITE_OTHER): Payer: Medicare Other | Admitting: Nurse Practitioner

## 2013-10-01 ENCOUNTER — Encounter: Payer: Self-pay | Admitting: Nurse Practitioner

## 2013-10-01 ENCOUNTER — Other Ambulatory Visit: Payer: Self-pay | Admitting: Nurse Practitioner

## 2013-10-01 DIAGNOSIS — I4891 Unspecified atrial fibrillation: Secondary | ICD-10-CM

## 2013-10-01 LAB — POCT INR: INR: 2.2

## 2013-10-01 NOTE — Patient Instructions (Signed)
Anticoagulation Dose Instructions as of 10/01/2013     [name] Tue Wed Thu Fri Sat   New Dose 5 mg 5 mg 5 mg 5 mg 5 mg 0 mg 0 mg    Description       Continue 1 tablet Qd and recheck in 2 weeks

## 2013-10-01 NOTE — Telephone Encounter (Signed)
Patient on norco- cannot be on ultram also

## 2013-10-01 NOTE — Telephone Encounter (Signed)
Last seen 10/01/13 MMM IF approved print and route to nurse

## 2013-10-01 NOTE — Progress Notes (Signed)
  Subjective:    Patient ID: [name], female    DOB: 07-Aug-1933, 79 y.o.   MRN: [id number]  HPI patient was seen Friday and INR was 5.1- here today for recheck    Review of Systems     Objective:   Physical Exam  Here for INR recheck only- see INR encounter      Assessment & Plan:  Here for INR recheck only see INR encouter for documentation

## 2013-10-02 NOTE — Telephone Encounter (Signed)
Just seen yesterday

## 2013-10-02 NOTE — Telephone Encounter (Signed)
Patient is on  norco- doesn't need ultram also

## 2013-10-16 ENCOUNTER — Other Ambulatory Visit: Payer: Self-pay | Admitting: Nurse Practitioner

## 2013-10-17 NOTE — Telephone Encounter (Signed)
rx ready for pickup 

## 2013-10-17 NOTE — Telephone Encounter (Signed)
Last filled 08/12/13, last seen 10/01/13. Rx will print

## 2013-10-18 ENCOUNTER — Encounter (HOSPITAL_COMMUNITY)
Admission: AD | Disposition: A | Discharge status: Home / Self Care | Payer: Self-pay | Source: Ambulatory Visit | Attending: Gastroenterology

## 2013-10-18 ENCOUNTER — Ambulatory Visit (INDEPENDENT_AMBULATORY_CARE_PROVIDER_SITE_OTHER): Payer: Medicare Other | Admitting: Pharmacist

## 2013-10-18 ENCOUNTER — Ambulatory Visit (HOSPITAL_COMMUNITY)
Admission: AD | Admit: 2013-10-18 | Discharge: 2013-10-18 | Disposition: A | Discharge status: Home / Self Care | Payer: Medicare Other | Source: Ambulatory Visit | Attending: Gastroenterology | Admitting: Gastroenterology

## 2013-10-18 ENCOUNTER — Encounter (INDEPENDENT_AMBULATORY_CARE_PROVIDER_SITE_OTHER): Payer: Self-pay

## 2013-10-18 ENCOUNTER — Telehealth: Payer: Self-pay | Admitting: Family Medicine

## 2013-10-18 ENCOUNTER — Encounter (HOSPITAL_COMMUNITY): Payer: Self-pay | Admitting: Gastroenterology

## 2013-10-18 ENCOUNTER — Telehealth: Payer: Self-pay | Admitting: Internal Medicine

## 2013-10-18 ENCOUNTER — Other Ambulatory Visit: Payer: Self-pay

## 2013-10-18 DIAGNOSIS — T18108A Unspecified foreign body in esophagus causing other injury, initial encounter: Secondary | ICD-10-CM | POA: Diagnosis not present

## 2013-10-18 DIAGNOSIS — K2970 Gastritis, unspecified, without bleeding: Secondary | ICD-10-CM | POA: Insufficient documentation

## 2013-10-18 DIAGNOSIS — J449 Chronic obstructive pulmonary disease, unspecified: Secondary | ICD-10-CM | POA: Diagnosis not present

## 2013-10-18 DIAGNOSIS — Q391 Atresia of esophagus with tracheo-esophageal fistula: Secondary | ICD-10-CM | POA: Diagnosis not present

## 2013-10-18 DIAGNOSIS — R791 Abnormal coagulation profile: Secondary | ICD-10-CM | POA: Diagnosis not present

## 2013-10-18 DIAGNOSIS — I10 Essential (primary) hypertension: Secondary | ICD-10-CM | POA: Insufficient documentation

## 2013-10-18 DIAGNOSIS — T18128A Food in esophagus causing other injury, initial encounter: Secondary | ICD-10-CM

## 2013-10-18 DIAGNOSIS — I509 Heart failure, unspecified: Secondary | ICD-10-CM | POA: Diagnosis not present

## 2013-10-18 DIAGNOSIS — I4891 Unspecified atrial fibrillation: Secondary | ICD-10-CM

## 2013-10-18 DIAGNOSIS — Z7901 Long term (current) use of anticoagulants: Secondary | ICD-10-CM | POA: Diagnosis not present

## 2013-10-18 DIAGNOSIS — IMO0002 Reserved for concepts with insufficient information to code with codable children: Secondary | ICD-10-CM | POA: Insufficient documentation

## 2013-10-18 DIAGNOSIS — R1319 Other dysphagia: Secondary | ICD-10-CM

## 2013-10-18 DIAGNOSIS — R1310 Dysphagia, unspecified: Secondary | ICD-10-CM | POA: Diagnosis present

## 2013-10-18 DIAGNOSIS — K222 Esophageal obstruction: Secondary | ICD-10-CM | POA: Insufficient documentation

## 2013-10-18 DIAGNOSIS — J4489 Other specified chronic obstructive pulmonary disease: Secondary | ICD-10-CM | POA: Insufficient documentation

## 2013-10-18 HISTORY — DX: Dyskinesia of esophagus: K22.4

## 2013-10-18 HISTORY — PX: ESOPHAGOGASTRODUODENOSCOPY: SHX5428

## 2013-10-18 HISTORY — DX: Esophageal obstruction: K22.2

## 2013-10-18 LAB — POCT INR: INR: 4.5

## 2013-10-18 SURGERY — EGD (ESOPHAGOGASTRODUODENOSCOPY)
Anesthesia: Moderate Sedation

## 2013-10-18 MED ORDER — SODIUM CHLORIDE 0.9 % IV SOLN
INTRAVENOUS | Status: DC
  Administered 2013-10-18: 17:00:00 via INTRAVENOUS

## 2013-10-18 MED ORDER — FENTANYL CITRATE 0.05 MG/ML IJ SOLN
INTRAMUSCULAR | Status: AC
  Filled 2013-10-18: qty 4

## 2013-10-18 MED ORDER — SODIUM CHLORIDE 0.9 % IV SOLN: INTRAVENOUS | Status: DC

## 2013-10-18 MED ORDER — MIDAZOLAM HCL 5 MG/ML IJ SOLN
INTRAMUSCULAR | Status: AC
  Filled 2013-10-18: qty 2

## 2013-10-18 MED ORDER — MIDAZOLAM HCL 10 MG/2ML IJ SOLN
INTRAMUSCULAR | Status: DC | PRN
  Administered 2013-10-18 (×2): 2 mg via INTRAVENOUS

## 2013-10-18 MED ORDER — DIPHENHYDRAMINE HCL 50 MG/ML IJ SOLN
INTRAMUSCULAR | Status: AC
  Filled 2013-10-18: qty 1

## 2013-10-18 MED ORDER — FENTANYL CITRATE 0.05 MG/ML IJ SOLN
INTRAMUSCULAR | Status: DC | PRN
  Administered 2013-10-18: 25 ug via INTRAVENOUS

## 2013-10-18 MED ORDER — BUTAMBEN-TETRACAINE-BENZOCAINE 2-2-14 % EX AERO
INHALATION_SPRAY | CUTANEOUS | Status: DC | PRN
  Administered 2013-10-18: 2 via TOPICAL

## 2013-10-18 NOTE — Patient Instructions (Signed)
Anticoagulation Dose Instructions as of 10/18/2013     [name] Tue Wed Thu Fri Sat   New Dose 5 mg 2.5 mg 5 mg 5 mg 5 mg 2.5 mg 5 mg    Description       No warfarin on thursday 10/18/13 or Friday 10/19/13.  Then decrease dose to 1/2 tablet on mondays and fridays and 1 tablet all other days       INR was 4.5 today (too thin)

## 2013-10-18 NOTE — H&P (Signed)
[facility] Admission Note   Primary Care Physician:  Rudi Heap, MD Primary Gastroenterologist:  Dr. Yancey Flemings  CHIEF COMPLAINT:  Acute Dysphagia.   HPI: [name] is a 79 y.o. female.  On Chronic Coumadin for A fib, has COPD. Hx of food impaction requiring EGD/meat removal in 09/2011.  Dysmotility on esophagram that month. Underwent follow up EGD with baloon dilation of distal esophageal stricture later that month.  Taking PPI regularly, Dexilant currently.  Underwent 05/2013 EGD with Elease Hashimoto dilatation of distal esophageal stricture after c/o recurrent dysphagia.  Swallowing all texture without problem since. Today at noon, ate a hot dog and it got stuck.  Attempts to clear food impaction at home with swallows of water unsuccesful, ended up regurgitating the food. Advised by Dr Lamar Sprinkles office to go  To Endo unit at [facility] for EGD with on call MD Dr Russella Dar.   Had INR level of 4.5 earlier today at [facility].  Dr Daphine Deutscher has given her new Coumadin schedule, she is to hold today and tomorrow's dose.  Pt has had no untoward bleeding/bruising.    Past Medical History  Diagnosis Date  . Hypertension   . Heart attack 5/96  . CAD (coronary artery disease) 5/96  . Edema   . Hyperlipidemia   . Chronic atrial fibrillation     Coumadin therapy   . Osteopenia   . Renal insufficiency   . Closed right ankle fracture   . CHF (congestive heart failure)   . LBP (low back pain)   . Arthritis   . Cataract   . COPD (chronic obstructive pulmonary disease)     wears O2 at home as needed  . Esophageal stricture     Past Surgical History  Procedure Laterality Date  . Heart stent      X2  . Mole removed  2013    umbilicus    Prior to Admission medications   Medication Sig Start Date End Date Taking? Authorizing Provider  alendronate (FOSAMAX) 35 MG tablet TAKE 1 TABLET ONCE WEEKLY ON EMPTY STOMACH WITH FULL GLASS OF WATER  09/21/13  Yes Ernestina Penna, MD  atorvastatin (LIPITOR) 20 MG tablet Take 1 tablet (20 mg total) by mouth daily. 06/25/13  Yes Mary-Margaret Daphine Deutscher, [name]  busPIRone (BUSPAR) 15 MG tablet Take 1 tablet (15 mg total) by mouth every 8 (eight) hours as needed. 05/28/13  Yes Mary-Margaret Daphine Deutscher, [name]  Cholecalciferol (VITAMIN D) 2000 UNITS tablet Take 2,000 Units by mouth daily.     Yes Historical Provider, MD  dexlansoprazole (DEXILANT) 60 MG capsule Take 1 capsule (60 mg total) by mouth daily. 06/25/13  Yes Mary-Margaret Daphine Deutscher, [name]  diltiazem (TIAZAC) 180 MG 24 hr capsule Take 1 capsule (180 mg total) by mouth daily. 06/25/13  Yes Mary-Margaret Daphine Deutscher, [name]  Fluticasone-Salmeterol (ADVAIR DISKUS) 250-50 MCG/DOSE AEPB Inhale 1 puff into the lungs 2 (two) times daily. 06/25/13  Yes Mary-Margaret Daphine Deutscher, [name]  furosemide (LASIX) 20 MG tablet Take 1 tablet (20 mg total) by mouth 2 (two) times daily. 06/25/13  Yes Mary-Margaret Daphine Deutscher, [name]  metoprolol (LOPRESSOR) 50 MG tablet Take 1 tablet (50 mg total) by mouth 2 (two) times daily. 09/28/13  Yes Mary-Margaret Daphine Deutscher, [name]  olmesartan (BENICAR) 40 MG tablet Take 1 tablet (40 mg total) by mouth daily. 06/25/13  Yes Mary-Margaret Daphine Deutscher, [name]  potassium chloride SA (K-DUR,KLOR-CON) 20 MEQ tablet Take 1 tablet (20 mEq total) by mouth daily. 06/25/13  Yes Mary-Margaret Daphine Deutscher, [name]  tiotropium (SPIRIVA HANDIHALER) 18  MCG inhalation capsule Place 1 capsule (18 mcg total) into inhaler and inhale daily. 06/25/13  Yes Mary-Margaret Daphine Deutscher, [name]  traMADol (ULTRAM) 50 MG tablet TAKE 1 TABLET 3 TIMES A DAY AS NEEDED 10/16/13  Yes Mary-Margaret Daphine Deutscher, [name]  warfarin (COUMADIN) 5 MG tablet Take 1 tablet (5 mg total) by mouth daily. 09/11/13  Yes Mary-Margaret Daphine Deutscher, [name]  cyclobenzaprine (FLEXERIL) 10 MG tablet Take 1 tablet (10 mg total) by mouth 3 (three) times daily as needed for muscle spasms. 05/28/13   Mary-Margaret Daphine Deutscher, [name]  HYDROcodone-acetaminophen (NORCO/VICODIN) 5-325 MG per  tablet TAKE 1 TABLET 2 TIMES A DAY 09/17/13   Mary-Margaret Daphine Deutscher, [name]  mupirocin ointment (BACTROBAN) 2 % Apply topically 3 (three) times daily. 08/17/13   Lynwood Dawley, PA-C    Current Facility-Administered Medications  Medication Dose Route Frequency Provider Last Rate Last Dose  . 0.9 %  sodium chloride infusion   Intravenous Continuous Dianah Field, PA-C      . 0.9 %  sodium chloride infusion   Intravenous Continuous Dianah Field, PA-C        Allergies as of 10/18/2013  . (No Known Allergies)    Family History  Problem Relation Age of Onset  . Colon cancer Neg Hx   . Esophageal cancer Neg Hx   . Stomach cancer Neg Hx   . Rectal cancer Neg Hx   . Hyperlipidemia Mother   . Hypertension Mother   . Heart disease Father   . Hyperlipidemia Father   . Cancer Sister   . Hyperlipidemia Sister   . Hypertension Sister     History   Social History  . Marital Status: Widowed.  He nephew and his wife are very attentive.     Spouse Name: N/A    Number of Children: Never had kids.   . Years of Education: N/A   Occupational History  . retired    Social History Main Topics  . Smoking status: Current Every Day Smoker -- 0.50 packs/day.  Also lives with 2 smokers.     Types: Cigarettes  . Smokeless tobacco: Never Used  . Alcohol Use: No     Comment: previous  . Drug Use: No  . Sexual Activity: Not on file    REVIEW OF SYSTEMS: Constitutional:  Stable weight ENT:  No nasal discharge, no nasal  bleeding Pulm:  + cough with stringy, clear mucous CV:  No chest pain, no palpitations GU:  Nocturia stable at ~ 4x per noc GI:  Per HPI.  No constipation or diarrhea.  Stools brown, no blood Heme:  No hx anemia.    Transfusions:  None MS: back pain and some left wrist pain Neuro:  No headache or blurry vision Derm:  No rash or sores or itching Endocrine:  No sweats, chills or polydipsia Immunization:  Current flu shot given 09/18/13 Travel:  None beyond [place]   PHYSICAL  EXAM:  Temp:  [98.1 F (36.7 C)] 98.1 F (36.7 C) (10/23 1616) Pulse Rate:  [87] 87 (10/23 1616) Resp:  [24] 24 (10/23 1616) BP: (142)/(89) 142/89 mmHg (10/23 1616) SpO2:  [95 %] 95 % (10/23 1616)    General:  Comfortable pleasant elderly WF. Ears:  slightly HOH Mouth:  2 lower incisors remain, other wise edentulous  Neck: no JVD, no mass, no TMG Lungs:  Diminished BS but clear.  No cough.  Raspy voice c/w cigarette smoking   Heart:  RRR.  No MRG  Abdomen:  Soft, ND, NT.  No  mass, no bruits    Rectal:  deferred  Msk:  No joint swelling.  Some kyphosis   Pulses:  Pedal pulses present Extremities:  No pedal edema  Neurologic: oriented x 3.  No tremor, no gross deficits Skin:  Excessive skin aging/wrinkling c/w her long hx of smoking cigarettes.  Psych:  Pleasant, relaxed.     Intake/Output from previous day:   Intake/Output this shift:    LAB RESULTS:  Recent Labs  10/18/13 1213  INR 4.5   Previous endoscopies/swallow evaluations 06/12/2013  EGD For Dysphagia Dilation of distal esophageal ring/stricture.   10/19/11  EGD with esophageal dilatation  09/28/2011  EGD with removal of meat impaction HH, distal esophageal stricture noted  09/2011  Barium esophagram Diffuse motility disorder with tertiary contractions.  13 mm tablet passed without incidence.    RADIOLOGY STUDIES: No results found.   IMPRESSION:   1.  Food impaction.  Hx of same in 2012.   S/p esophageal stricture dilations in 09/2011 and 05/2013 Also has documented dysmotility 2.  Chronic Coumadin. For hx A fib.  INR today supratherapeutic at 4.5.  Dr Ermalene Searing office advised her to hold Coumadin and readjusted Coumadin schedule.    PLAN: 1.  EGD today.  Hopefully home after this   LOS: 0 days   Jennye Moccasin  10/18/2013, 4:30 PM Pager [phone]

## 2013-10-18 NOTE — Telephone Encounter (Signed)
Patient aware.

## 2013-10-18 NOTE — Interval H&P Note (Signed)
History and Physical Interval Note:  10/18/2013 4:14 PM  [name]  has presented today for surgery, with the diagnosis of food impaction  The various methods of treatment have been discussed with the patient and family. After consideration of risks, benefits and other options for treatment, the patient has consented to  Procedure(s) with comments: ESOPHAGOGASTRODUODENOSCOPY (EGD) (N/A) - possible food impaction as a surgical intervention .  The patient's history has been reviewed, patient examined, no change in status, stable for surgery.  I have reviewed the patient's chart and labs.  Questions were answered to the patient's satisfaction.     Venita Lick. Russella Dar MD Clement[facility] Graham

## 2013-10-18 NOTE — Telephone Encounter (Signed)
Patient with a history of esophageal stricture and food impaction in the past, she is on coumadin.  Patient's friend reports that she was eating lunch today hot dog and french fries.  She got chocked, she is not able to swallow fluids.  She is able to swallow her own secretions.  She denies c/o cp.  Discussed with Dr. Russella Dar hospital MD patient to go to admitting at [facility] Endo for EGD.  Patient advised to go to [facility] now and be NPO.  Dr. Russella Dar aware that the patient will be approx 1 hour to come from [place] and she is on coumadin.

## 2013-10-18 NOTE — H&P (View-Only) (Signed)
Subjective:    Patient ID: [name], female    DOB: Aug 23, 1933, 79 y.o.   MRN: [id number]  Hypertension This is a chronic problem. The current episode started more than 1 year ago. The problem has been waxing and waning since onset. The problem is uncontrolled. Associated symptoms include shortness of breath. Pertinent negatives include no anxiety, chest pain, headaches, malaise/fatigue, orthopnea, palpitations or peripheral edema. There are no associated agents to hypertension. Risk factors for coronary artery disease include dyslipidemia, post-menopausal state, sedentary lifestyle and smoking/tobacco exposure. Past treatments include angiotensin blockers, beta blockers and diuretics. The current treatment provides moderate improvement. Compliance problems include diet and exercise.  There is no history of a thyroid problem.  Hyperlipidemia This is a chronic problem. The current episode started more than 1 year ago. The problem is uncontrolled. Recent lipid tests were reviewed and are high. She has no history of diabetes, hypothyroidism or liver disease. Factors aggravating her hyperlipidemia include smoking and beta blockers. Associated symptoms include shortness of breath. Pertinent negatives include no chest pain. Current antihyperlipidemic treatment includes statins. The current treatment provides mild improvement of lipids. Compliance problems include adherence to diet and adherence to exercise.  Risk factors for coronary artery disease include hypertension, dyslipidemia, family history and post-menopausal.  Atrial Fibrillation Presents for follow-up visit. Symptoms include hypertension, shortness of breath and tachycardia. Symptoms are negative for chest pain and palpitations. The symptoms have been stable. Past treatments include warfarin and beta blockers. Past medical history includes atrial fibrillation and hyperlipidemia.  Gastrophageal Reflux She complains of a hoarse voice. She reports  no chest pain, no coughing, no heartburn or no nausea. This is a chronic problem. The current episode started more than 1 year ago. The problem occurs rarely. The problem has been resolved. The symptoms are aggravated by certain foods. Risk factors include smoking/tobacco exposure. She has tried a PPI for the symptoms. The treatment provided significant relief.  COPD Well controlled on spirivia and advair- has  Not needed albuterol rescue inhaler. Hypokalemia K-dur daily no c/o lower ext cramping Atrial Fib Well controlled on diltiazem- also on coumadin without any bleeding    Review of Systems  Constitutional: Negative for malaise/fatigue.  HENT: Positive for hoarse voice.   Respiratory: Positive for shortness of breath. Negative for cough.   Cardiovascular: Negative for chest pain, palpitations and orthopnea.  Gastrointestinal: Negative for heartburn and nausea.  Neurological: Negative for headaches.  All other systems reviewed and are negative.       Objective:   Physical Exam  Vitals reviewed. Constitutional: She is oriented to person, place, and time. She appears well-developed and well-nourished.  HENT:  Nose: Nose normal.  Mouth/Throat: Oropharynx is clear and moist.  Eyes: EOM are normal.  Neck: Trachea normal, normal range of motion and full passive range of motion without pain. Neck supple. No JVD present. Carotid bruit is not present. No thyromegaly present.  Cardiovascular: Regular rhythm, normal heart sounds and intact distal pulses.  Exam reveals no gallop and no friction rub.   No murmur heard. Tachycardia   Pulmonary/Chest: Effort normal and breath sounds normal.  Abdominal: Soft. Bowel sounds are normal. She exhibits no distension and no mass. There is no tenderness.  Musculoskeletal: Normal range of motion.  Lymphadenopathy:    She has no cervical adenopathy.  Neurological: She is alert and oriented to person, place, and time. She has normal reflexes.  Skin:  Skin is warm and dry.  Psychiatric: She has a normal mood and  affect. Her behavior is normal. Judgment and thought content normal.   BP 143/93  Pulse 126  Temp(Src) 96.7 F (35.9 C) (Oral)  Ht 5\' 1"  (1.549 m)  Wt 156 lb (70.761 kg)  BMI 29.49 kg/m2  LMP 03/27/1983        Assessment & Plan:   1. Essential hypertension, benign   2. Atrial fibrillation   3. CAD (coronary artery disease) of artery bypass graft   4. Hyperlipidemia with target LDL less than 100    Orders Placed This Encounter  Procedures  . CMP14+EGFR  . NMR, lipoprofile   Meds ordered this encounter  Medications  . metoprolol (LOPRESSOR) 50 MG tablet    Sig: Take 1 tablet (50 mg total) by mouth 2 (two) times daily.    Dispense:  60 tablet    Refill:  5    Order Specific Question:  Supervising Provider    Answer:  Christell Constant, DONALD W [[id number]]   Stopped tekturna and changed metoprolol to 50 mg BID- for heart rate and blood pressure Continue all meds Labs pending Diet and exercise encouraged Health maintenance reviewed Follow up in 2 weeks Discussed CT scan results with patient  Mary-Margaret Daphine Deutscher, [name]

## 2013-10-18 NOTE — Telephone Encounter (Signed)
Wants her hydrocodone refilled also please she thinks it due 10/19/13

## 2013-10-18 NOTE — Telephone Encounter (Signed)
Just had ultram filled- have said before cannot be on both norco and ultram

## 2013-10-18 NOTE — Op Note (Signed)
[facility] [address] [place], [address]   ENDOSCOPY PROCEDURE REPORT  PATIENT: Katie, Melendez  MR#: [id number] BIRTHDATE: November 15, 1933 , 80  yrs. old GENDER: Female ENDOSCOPIST: Meryl Dare, MD, [facility] REFERRED BY: PROCEDURE DATE:  10/18/2013 PROCEDURE:  EGD w/ fb removal ASA CLASS:     Class III INDICATIONS:  Dysphagia.   Food impaction.   Foreign body removal from esophagus.  INR=4.1 MEDICATIONS: medications were titrated to patient response per physician's verbal order, Fentanyl 50 mcg IV, and Versed 4 mg IV TOPICAL ANESTHETIC: Cetacaine Spray DESCRIPTION OF PROCEDURE: After the risks benefits and alternatives of the procedure were thoroughly explained, informed consent was obtained.  The Pentax Gastroscope [id number] endoscope was introduced through the mouth and advanced to the second portion of the duodenum without limitations.  The instrument was slowly withdrawn as the mucosa was fully examined.  ESOPHAGUS: Hot dog impacted in distal esophagus.  A large portion was removed orally with Lucina Mellow basket and a smaller portion was gently advanced into the stomach.   Distal esophageal rings/strictures. Dilation not performed due to elevated INR.  The esophagus was otherwise normal. STOMACH: Gastritis, moderate, was found in the gastric antrum and gastric body. No biopsies taken with elevated INR. The stomach otherwise appeared normal. DUODENUM: The duodenal mucosa showed no abnormalities in the bulb and second portion of the duodenum.  Retroflexed views revealed small polyps in cardia, otherwise normal. The scope was then withdrawn from the patient and the procedure completed.  COMPLICATIONS: There were no complications. ENDOSCOPIC IMPRESSION: 1.   Hot dog impacted in distal esophagus; Removed 2.   Distal esophageal rings/strictures. 3.   Moderate gastritis in the gastric antrum and gastric body  RECOMMENDATIONS: 1.  Continue PPI daily 2.   OP follow-up in 2-3 weeks with Dr. Marina Goodell 3.  Soft foods only until cleared by Dr. Marina Goodell   eSigned:  Meryl Dare, MD, Clement[facility] Graham 10/18/2013 5:03 PM   XB:JYNW Marina Goodell, MD

## 2013-10-18 NOTE — Telephone Encounter (Signed)
Patient notified that rx up front and ready to pick up. 

## 2013-10-19 ENCOUNTER — Encounter (HOSPITAL_COMMUNITY): Payer: Self-pay | Admitting: Gastroenterology

## 2013-10-24 ENCOUNTER — Ambulatory Visit (INDEPENDENT_AMBULATORY_CARE_PROVIDER_SITE_OTHER): Payer: Medicare Other | Admitting: Pharmacist

## 2013-10-24 DIAGNOSIS — I4891 Unspecified atrial fibrillation: Secondary | ICD-10-CM

## 2013-10-24 LAB — POCT INR: INR: 2.2

## 2013-10-24 NOTE — Patient Instructions (Signed)
Anticoagulation Dose Instructions as of 10/24/2013     Glynis Smiles Tue Wed Thu Fri Sat   New Dose 5 mg 2.5 mg 5 mg 5 mg 5 mg 2.5 mg 5 mg    Description       Continue current warfarin dose of 1/2 tablet on mondays and fridays and 1 tablet all other days.       INR was 2.2 today

## 2013-11-07 ENCOUNTER — Encounter: Payer: Self-pay | Admitting: Internal Medicine

## 2013-11-07 ENCOUNTER — Ambulatory Visit (INDEPENDENT_AMBULATORY_CARE_PROVIDER_SITE_OTHER): Payer: Medicare Other | Admitting: Internal Medicine

## 2013-11-07 ENCOUNTER — Other Ambulatory Visit: Payer: Self-pay | Admitting: Nurse Practitioner

## 2013-11-07 VITALS — BP 140/70 | HR 94 | Ht 61.0 in | Wt 161.0 lb

## 2013-11-07 DIAGNOSIS — R1310 Dysphagia, unspecified: Secondary | ICD-10-CM

## 2013-11-07 DIAGNOSIS — T18108A Unspecified foreign body in esophagus causing other injury, initial encounter: Secondary | ICD-10-CM

## 2013-11-07 DIAGNOSIS — K222 Esophageal obstruction: Secondary | ICD-10-CM

## 2013-11-07 DIAGNOSIS — T18128A Food in esophagus causing other injury, initial encounter: Secondary | ICD-10-CM

## 2013-11-07 NOTE — Progress Notes (Signed)
HISTORY OF PRESENT ILLNESS:  [name] is a 79 y.o. female with multiple significant medical problems including hypertension, coronary artery disease, hyperlipidemia, COPD, congestive heart failure, chronic atrial fibrillation for which she is on Coumadin, and GERD complicated by peptic stricture for she has had recurrent food impaction. I performed EGD with dilation to 34 [place] in June of 2014. She did well for several months and then began to develop some recurrent dysphagia. This culminated in food impaction 10/18/2013 for which she had to go to the hospital and have urgent endoscopy with removal of food impaction, by Dr. Russella Dar. She did well and has been modified diet since. No dilation performed secondary to elevated INR. This followup set up. She continues with soft foods. Minimal troubles. Continues on Coumadin. On Dexilant for GERD. Accompanied by her daughter  REVIEW OF SYSTEMS:  All non-GI ROS negative except for arthritis and back pain  Past Medical History  Diagnosis Date  . Hypertension   . Heart attack 5/96  . CAD (coronary artery disease) 5/96  . Edema   . Hyperlipidemia   . Chronic atrial fibrillation     Coumadin therapy   . Osteopenia   . Renal insufficiency   . Closed right ankle fracture   . CHF (congestive heart failure)   . LBP (low back pain)   . Arthritis   . Cataract   . COPD (chronic obstructive pulmonary disease)     wears O2 at home as needed  . Esophageal stricture   . Esophageal dysmotility 09/2011    seen on barium esophagram.    Past Surgical History  Procedure Laterality Date  . Heart stent      X2  . Mole removed  2013    umbilicus  . Esophagogastroduodenoscopy  2012, 05/2013,09/2013    dilation of esophagus and stricture in 09/2011, 05/2013.  meat disimpaction 09/2013  . Esophagogastroduodenoscopy N/A 10/18/2013    Procedure: ESOPHAGOGASTRODUODENOSCOPY (EGD);  Surgeon: Meryl Dare, MD;  Location: [facility] ENDOSCOPY;  Service:  Endoscopy;  Laterality: N/A;  possible food impaction    Social History [name]  reports that she has been smoking Cigarettes.  She has been smoking about 0.50 packs per day. She has never used smokeless tobacco. She reports that she does not drink alcohol or use illicit drugs.  family history includes Cancer in her sister; Heart disease in her father; Hyperlipidemia in her father, mother, and sister; Hypertension in her mother and sister. There is no history of Colon cancer, Esophageal cancer, Stomach cancer, or Rectal cancer.  No Known Allergies     PHYSICAL EXAMINATION: Vital signs: BP 140/70  Pulse 94  Ht 5\' 1"  (1.549 m)  Wt 161 lb (73.029 kg)  BMI 30.44 kg/m2  LMP 03/27/1983 General: Well-developed, well-nourished, no acute distress HEENT: Sclerae are anicteric, conjunctiva pink. Oral mucosa intact Lungs: Clear Heart: Regular Abdomen: soft, nontender, nondistended, no obvious ascites, no peritoneal signs, normal bowel sounds. No organomegaly. Extremities: No edema Psychiatric: alert and oriented x3. Cooperative    ASSESSMENT:  #1. GERD complicated by peptic stricture. Recurrent food impaction recently #2. Multiple medical problems including history victor fibrillation for which she is on Coumadin.   PLAN:  #1. Upper endoscopy with esophageal dilation. The patient is high-risk given her comorbidities and the need to address Coumadin.The nature of the procedure, as well as the risks, benefits, and alternatives were carefully and thoroughly reviewed with the patient. Ample time for discussion and questions allowed. The patient understood,  was satisfied, and agreed to proceed. We will overcome in 5 days prior to procedure as previously done, and supported by her cardiologist a few months ago. She's had no interval change her cardiac status. #2. Continue PPI #3. Continue with modified diet and to chew food well until endoscopy completed

## 2013-11-07 NOTE — Patient Instructions (Signed)
You have been scheduled for an endoscopy with propofol. Please follow written instructions given to you at your visit today. If you use inhalers (even only as needed), please bring them with you on the day of your procedure. Your physician has requested that you go to www.startemmi.com and enter the access code given to you at your visit today. This web site gives a general overview about your procedure. However, you should still follow specific instructions given to you by our office regarding your preparation for the procedure.  We confirmed with your cardiologist back in 05/2013 that you could hold your Coumadin for 4 days prior to your procedure

## 2013-11-08 ENCOUNTER — Ambulatory Visit (INDEPENDENT_AMBULATORY_CARE_PROVIDER_SITE_OTHER): Payer: Medicare Other | Admitting: Pharmacist

## 2013-11-08 ENCOUNTER — Telehealth: Payer: Self-pay | Admitting: Pharmacist

## 2013-11-08 DIAGNOSIS — I4891 Unspecified atrial fibrillation: Secondary | ICD-10-CM

## 2013-11-08 LAB — POCT INR: INR: 2.4

## 2013-11-08 NOTE — Patient Instructions (Signed)
Anticoagulation Dose Instructions as of 11/08/2013     [name] Tue Wed Thu Fri Sat   New Dose 5 mg 2.5 mg 5 mg 5 mg 5 mg 2.5 mg 5 mg    Description       Continue current warfarin dose of 1/2 tablet on mondays and fridays and 1 tablet all other days.      INR was 2.4 today

## 2013-11-08 NOTE — Telephone Encounter (Signed)
Will have to be referred to pain clinic if needs higher than ultram

## 2013-11-12 NOTE — Telephone Encounter (Signed)
ntbs to discuss 

## 2013-11-13 ENCOUNTER — Encounter: Payer: Self-pay | Admitting: Internal Medicine

## 2013-11-13 NOTE — Telephone Encounter (Signed)
Patient aware and will call back to make an appointment she was busy today

## 2013-11-16 ENCOUNTER — Encounter: Payer: Self-pay | Admitting: Nurse Practitioner

## 2013-11-16 ENCOUNTER — Ambulatory Visit (INDEPENDENT_AMBULATORY_CARE_PROVIDER_SITE_OTHER): Payer: Medicare Other | Admitting: Nurse Practitioner

## 2013-11-16 VITALS — BP 138/94 | HR 100 | Temp 96.9°F | Ht 61.0 in | Wt 159.0 lb

## 2013-11-16 DIAGNOSIS — M545 Low back pain, unspecified: Secondary | ICD-10-CM

## 2013-11-16 DIAGNOSIS — G8929 Other chronic pain: Secondary | ICD-10-CM

## 2013-11-16 MED ORDER — HYDROCODONE-ACETAMINOPHEN 5-325 MG PO TABS: 1.0000 | ORAL_TABLET | Freq: Two times a day (BID) | ORAL | Status: DC | PRN

## 2013-11-16 NOTE — Patient Instructions (Signed)
Back Pain, Adult Low back pain is very common. About 1 in 5 people have back pain.The cause of low back pain is rarely dangerous. The pain often gets better over time.About half of people with a sudden onset of back pain feel better in just 2 weeks. About 8 in 10 people feel better by 6 weeks.  CAUSES Some common causes of back pain include:  Strain of the muscles or ligaments supporting the spine.  Wear and tear (degeneration) of the spinal discs.  Arthritis.  Direct injury to the back. DIAGNOSIS Most of the time, the direct cause of low back pain is not known.However, back pain can be treated effectively even when the exact cause of the pain is unknown.Answering your caregiver's questions about your overall health and symptoms is one of the most accurate ways to make sure the cause of your pain is not dangerous. If your caregiver needs more information, he or she may order lab work or imaging tests (X-rays or MRIs).However, even if imaging tests show changes in your back, this usually does not require surgery. HOME CARE INSTRUCTIONS For many people, back pain returns.Since low back pain is rarely dangerous, it is often a condition that people can learn to manageon their own.   Remain active. It is stressful on the back to sit or stand in one place. Do not sit, drive, or stand in one place for more than 30 minutes at a time. Take short walks on level surfaces as soon as pain allows.Try to increase the length of time you walk each day.  Do not stay in bed.Resting more than 1 or 2 days can delay your recovery.  Do not avoid exercise or work.Your body is made to move.It is not dangerous to be active, even though your back may hurt.Your back will likely heal faster if you return to being active before your pain is gone.  Pay attention to your body when you bend and lift. Many people have less discomfortwhen lifting if they bend their knees, keep the load close to their bodies,and  avoid twisting. Often, the most comfortable positions are those that put less stress on your recovering back.  Find a comfortable position to sleep. Use a firm mattress and lie on your side with your knees slightly bent. If you lie on your back, put a pillow under your knees.  Only take over-the-counter or prescription medicines as directed by your caregiver. Over-the-counter medicines to reduce pain and inflammation are often the most helpful.Your caregiver may prescribe muscle relaxant drugs.These medicines help dull your pain so you can more quickly return to your normal activities and healthy exercise.  Put ice on the injured area.  Put ice in a plastic bag.  Place a towel between your skin and the bag.  Leave the ice on for 15-20 minutes, 03-04 times a day for the first 2 to 3 days. After that, ice and heat may be alternated to reduce pain and spasms.  Ask your caregiver about trying back exercises and gentle massage. This may be of some benefit.  Avoid feeling anxious or stressed.Stress increases muscle tension and can worsen back pain.It is important to recognize when you are anxious or stressed and learn ways to manage it.Exercise is a great option. SEEK MEDICAL CARE IF:  You have pain that is not relieved with rest or medicine.  You have pain that does not improve in 1 week.  You have new symptoms.  You are generally not feeling well. SEEK   IMMEDIATE MEDICAL CARE IF:   You have pain that radiates from your back into your legs.  You develop new bowel or bladder control problems.  You have unusual weakness or numbness in your arms or legs.  You develop nausea or vomiting.  You develop abdominal pain.  You feel faint. Document Released: 12/13/2005 Document Revised: 06/13/2012 Document Reviewed: 05/03/2011 [employer] Patient Information 2014 [employer].  

## 2013-11-16 NOTE — Progress Notes (Signed)
  Subjective:    Patient ID: [name], female    DOB: 1933/02/20, 79 y.o.   MRN: [id number]  HPI Patient in today to discuss her pain medication- she takes pain medication for arthritis in her lower back- SHe was on hydrocodone and tramadol- I told her that she could not be on both so she choose to stay on tramadol. Now she is saying that tramadol not working and she can't stand the pain.    Review of Systems  Unable to perform ROS Musculoskeletal: Positive for back pain and gait problem.  All other systems reviewed and are negative.       Objective:   Physical Exam  Constitutional: She appears well-developed and well-nourished.  Cardiovascular: Normal rate, regular rhythm and normal heart sounds.   Musculoskeletal:  Back pain with walking- has to walk hunched over. Pain with flexion and extension of lower back. (-) straight leg raises bil Motor strength intact bil  Neurological: She has normal reflexes.    BP 138/94  Pulse 100  Temp(Src) 96.9 F (36.1 C) (Oral)  Ht 5\' 1"  (1.549 m)  Wt 159 lb (72.122 kg)  BMI 30.06 kg/m2  LMP 03/27/1983       Assessment & Plan:   1. Chronic low back pain    Meds ordered this encounter  Medications  . HYDROcodone-acetaminophen (NORCO/VICODIN) 5-325 MG per tablet    Sig: Take 1 tablet by mouth 2 (two) times daily as needed for moderate pain.    Dispense:  60 tablet    Refill:  0    Order Specific Question:  Supervising Provider    Answer:  Ernestina Penna [[id number]]   Moist heat to lower back Avoid heavy lifting Rest rto prn  Mary-Margaret Daphine Deutscher, [name]

## 2013-11-19 ENCOUNTER — Encounter: Payer: Medicare Other | Admitting: Internal Medicine

## 2013-12-04 ENCOUNTER — Other Ambulatory Visit: Payer: Self-pay | Admitting: Nurse Practitioner

## 2013-12-05 ENCOUNTER — Other Ambulatory Visit: Payer: Self-pay | Admitting: Nurse Practitioner

## 2013-12-09 ENCOUNTER — Other Ambulatory Visit: Payer: Self-pay | Admitting: Nurse Practitioner

## 2013-12-10 ENCOUNTER — Ambulatory Visit (INDEPENDENT_AMBULATORY_CARE_PROVIDER_SITE_OTHER): Payer: Medicare Other | Admitting: Pharmacist

## 2013-12-10 DIAGNOSIS — I4891 Unspecified atrial fibrillation: Secondary | ICD-10-CM

## 2013-12-10 LAB — POCT INR: INR: 2.5

## 2013-12-10 MED ORDER — FUROSEMIDE 20 MG PO TABS: 40.0000 mg | ORAL_TABLET | Freq: Every day | ORAL | Status: DC

## 2013-12-10 MED ORDER — ALENDRONATE SODIUM 35 MG PO TABS: 70.0000 mg | ORAL_TABLET | ORAL | Status: DC

## 2013-12-10 NOTE — Patient Instructions (Signed)
Anticoagulation Dose Instructions as of 12/10/2013     Glynis Smiles Tue Wed Thu Fri Sat   New Dose 5 mg 2.5 mg 5 mg 5 mg 5 mg 2.5 mg 5 mg    Description       Continue current warfarin dose of 1/2 tablet on mondays and fridays and 1 tablet all other days.       INR was 2.5 today

## 2013-12-18 ENCOUNTER — Other Ambulatory Visit: Payer: Self-pay

## 2013-12-18 ENCOUNTER — Telehealth: Payer: Self-pay | Admitting: Nurse Practitioner

## 2013-12-18 DIAGNOSIS — I4891 Unspecified atrial fibrillation: Secondary | ICD-10-CM

## 2013-12-18 MED ORDER — HYDROCODONE-ACETAMINOPHEN 5-325 MG PO TABS: 1.0000 | ORAL_TABLET | Freq: Two times a day (BID) | ORAL | Status: DC | PRN

## 2013-12-18 MED ORDER — DILTIAZEM HCL ER BEADS 180 MG PO CP24: 180.0000 mg | ORAL_CAPSULE | Freq: Every day | ORAL | Status: DC

## 2013-12-18 NOTE — Telephone Encounter (Signed)
Patient aware to pick up 

## 2013-12-18 NOTE — Telephone Encounter (Signed)
rx ready for pickup 

## 2013-12-28 ENCOUNTER — Ambulatory Visit (INDEPENDENT_AMBULATORY_CARE_PROVIDER_SITE_OTHER): Payer: Medicare Other | Admitting: General Practice

## 2013-12-28 ENCOUNTER — Encounter: Payer: Self-pay | Admitting: General Practice

## 2013-12-28 ENCOUNTER — Ambulatory Visit (INDEPENDENT_AMBULATORY_CARE_PROVIDER_SITE_OTHER): Payer: Medicare Other

## 2013-12-28 VITALS — BP 128/86 | HR 95 | Temp 97.4°F | Ht 61.0 in | Wt 158.0 lb

## 2013-12-28 DIAGNOSIS — R0781 Pleurodynia: Secondary | ICD-10-CM

## 2013-12-28 DIAGNOSIS — R079 Chest pain, unspecified: Secondary | ICD-10-CM

## 2013-12-28 DIAGNOSIS — M940 Chondrocostal junction syndrome [Tietze]: Secondary | ICD-10-CM

## 2013-12-28 DIAGNOSIS — I2510 Atherosclerotic heart disease of native coronary artery without angina pectoris: Secondary | ICD-10-CM

## 2013-12-28 NOTE — Progress Notes (Signed)
   Subjective:    Patient ID: [name], female    DOB: 06/01/1933, 80 y.o.   MRN: [id number]  HPI Patient presents today with complaints of left lateral breast and rib pain. Reports hurting her left breast and side area on 12/26/13 by leaning over a chair. She denies falling. Mammogram on 11/20/13, showed no abnormal findings.    Review of Systems  Constitutional: Negative for fever and chills.  Respiratory: Negative for chest tightness and shortness of breath.   Cardiovascular: Negative for chest pain and palpitations.  Musculoskeletal:       Left rib and upper upper shoulder pain        Objective:   Physical Exam  Constitutional: She is oriented to person, place, and time. She appears well-developed and well-nourished.  Cardiovascular: Normal rate, regular rhythm and normal heart sounds.   Pulmonary/Chest: Effort normal and breath sounds normal.  Neurological: She is alert and oriented to person, place, and time.  Skin: Skin is warm and dry.  Psychiatric: She has a normal mood and affect.      [facility] reading (PRIMARY) by Coralie KeensMae E. Janette Harvie, [name]-C, no fracture or dislocation noted.                                   Assessment & Plan:  1. Rib pain on left side  - DG Chest 2 View; Future  2. Costochondritis -instructions provided and discussed -RTO if symptoms worsen or unresolved 3. CAD (coronary artery disease)  - Ambulatory referral to Cardiology -Patient verbalized understanding Coralie KeensMae E. Caedin Mogan, [name]-C

## 2013-12-28 NOTE — Patient Instructions (Addendum)
Costochondritis Costochondritis (Tietze syndrome), or costochondral separation, is a swelling and irritation (inflammation) of the tissue (cartilage) that connects your ribs with your breastbone (sternum). It may occur on its own (spontaneously), through damage caused by an accident (trauma), or simply from coughing or minor exercise. It may take up to 6 weeks to get better and longer if you are unable to be conservative in your activities. HOME CARE INSTRUCTIONS   Avoid exhausting physical activity. Try not to strain your ribs during normal activity. This would include any activities using chest, belly (abdominal), and side muscles, especially if heavy weights are used.  Use ice for 15-20 minutes per hour while awake for the first 2 days. Place the ice in a plastic bag, and place a towel between the bag of ice and your skin.  Only take over-the-counter or prescription medicines for pain, discomfort, or fever as directed by your caregiver. SEEK IMMEDIATE MEDICAL CARE IF:   Your pain increases or you are very uncomfortable.  You have a fever.  You develop difficulty with your breathing.  You cough up blood.  You develop worse chest pains, shortness of breath, sweating, or vomiting.  You develop new, unexplained problems (symptoms). MAKE SURE YOU:   Understand these instructions.  Will watch your condition.  Will get help right away if you are not doing well or get worse. Document Released: 09/22/2005 Document Revised: 03/06/2012 Document Reviewed: 07/17/2013 [facility] Patient Information 2014 [place], [place].   Fall Prevention and Home Safety Falls cause injuries and can affect all age groups. It is possible to use preventive measures to significantly decrease the likelihood of falls. There are many simple measures which can make your home safer and prevent falls. OUTDOORS  Repair cracks and edges of walkways and driveways.  Remove high doorway thresholds.  Trim shrubbery on  the main path into your home.  Have good outside lighting.  Clear walkways of tools, rocks, debris, and clutter.  Check that handrails are not broken and are securely fastened. Both sides of steps should have handrails.  Have leaves, snow, and ice cleared regularly.  Use sand or salt on walkways during winter months.  In the garage, clean up grease or oil spills. BATHROOM  Install night lights.  Install grab bars by the toilet and in the tub and shower.  Use non-skid mats or decals in the tub or shower.  Place a plastic non-slip stool in the shower to sit on, if needed.  Keep floors dry and clean up all water on the floor immediately.  Remove soap buildup in the tub or shower on a regular basis.  Secure bath mats with non-slip, double-sided rug tape.  Remove throw rugs and tripping hazards from the floors. BEDROOMS  Install night lights.  Make sure a bedside light is easy to reach.  Do not use oversized bedding.  Keep a telephone by your bedside.  Have a firm chair with side arms to use for getting dressed.  Remove throw rugs and tripping hazards from the floor. KITCHEN  Keep handles on pots and pans turned toward the center of the stove. Use back burners when possible.  Clean up spills quickly and allow time for drying.  Avoid walking on wet floors.  Avoid hot utensils and knives.  Position shelves so they are not too high or low.  Place commonly used objects within easy reach.  If necessary, use a sturdy step stool with a grab bar when reaching.  Keep electrical cables out of the  way.  Do not use floor polish or wax that makes floors slippery. If you must use wax, use non-skid floor wax.  Remove throw rugs and tripping hazards from the floor. STAIRWAYS  Never leave objects on stairs.  Place handrails on both sides of stairways and use them. Fix any loose handrails. Make sure handrails on both sides of the stairways are as long as the  stairs.  Check carpeting to make sure it is firmly attached along stairs. Make repairs to worn or loose carpet promptly.  Avoid placing throw rugs at the top or bottom of stairways, or properly secure the rug with carpet tape to prevent slippage. Get rid of throw rugs, if possible.  Have an electrician put in a light switch at the top and bottom of the stairs. OTHER FALL PREVENTION TIPS  Wear low-heel or rubber-soled shoes that are supportive and fit well. Wear closed toe shoes.  When using a stepladder, make sure it is fully opened and both spreaders are firmly locked. Do not climb a closed stepladder.  Add color or contrast paint or tape to grab bars and handrails in your home. Place contrasting color strips on first and last steps.  Learn and use mobility aids as needed. Install an electrical emergency response system.  Turn on lights to avoid dark areas. Replace light bulbs that burn out immediately. Get light switches that glow.  Arrange furniture to create clear pathways. Keep furniture in the same place.  Firmly attach carpet with non-skid or double-sided tape.  Eliminate uneven floor surfaces.  Select a carpet pattern that does not visually hide the edge of steps.  Be aware of all pets. OTHER HOME SAFETY TIPS  Set the water temperature for 120 F (48.8 C).  Keep emergency numbers on or near the telephone.  Keep smoke detectors on every level of the home and near sleeping areas. Document Released: 12/03/2002 Document Revised: 06/13/2012 Document Reviewed: 03/03/2012 [facility] Patient Information 2014 [place].

## 2014-01-01 ENCOUNTER — Other Ambulatory Visit: Payer: Self-pay | Admitting: Nurse Practitioner

## 2014-01-04 ENCOUNTER — Encounter: Payer: Self-pay | Admitting: Nurse Practitioner

## 2014-01-04 ENCOUNTER — Ambulatory Visit (INDEPENDENT_AMBULATORY_CARE_PROVIDER_SITE_OTHER): Payer: Medicare Other | Admitting: Nurse Practitioner

## 2014-01-04 ENCOUNTER — Other Ambulatory Visit: Payer: Self-pay | Admitting: Nurse Practitioner

## 2014-01-04 VITALS — BP 150/90 | HR 58 | Temp 97.4°F | Ht 61.0 in | Wt 165.0 lb

## 2014-01-04 DIAGNOSIS — E785 Hyperlipidemia, unspecified: Secondary | ICD-10-CM

## 2014-01-04 DIAGNOSIS — I4891 Unspecified atrial fibrillation: Secondary | ICD-10-CM

## 2014-01-04 DIAGNOSIS — I25810 Atherosclerosis of coronary artery bypass graft(s) without angina pectoris: Secondary | ICD-10-CM

## 2014-01-04 DIAGNOSIS — G8929 Other chronic pain: Secondary | ICD-10-CM

## 2014-01-04 DIAGNOSIS — M545 Low back pain, unspecified: Secondary | ICD-10-CM

## 2014-01-04 DIAGNOSIS — I10 Essential (primary) hypertension: Secondary | ICD-10-CM

## 2014-01-04 NOTE — Progress Notes (Signed)
Subjective:    Patient ID: [name], female    DOB: [birth year], 80 y.o.   MRN: [id number]  Patient here today for follow up- No changes since last visit- no complaints today.  Hypertension This is a chronic problem. The current episode started more than 1 year ago. The problem has been waxing and waning since onset. The problem is uncontrolled. Associated symptoms include shortness of breath. Pertinent negatives include no anxiety, chest pain, headaches, malaise/fatigue, orthopnea, palpitations or peripheral edema. There are no associated agents to hypertension. Risk factors for coronary artery disease include dyslipidemia, post-menopausal state, sedentary lifestyle and smoking/tobacco exposure. Past treatments include angiotensin blockers, beta blockers and diuretics. The current treatment provides moderate improvement. Compliance problems include diet and exercise.  There is no history of a thyroid problem.  Hyperlipidemia This is a chronic problem. The current episode started more than 1 year ago. The problem is uncontrolled. Recent lipid tests were reviewed and are high. She has no history of diabetes, hypothyroidism or liver disease. Factors aggravating her hyperlipidemia include smoking and beta blockers. Associated symptoms include shortness of breath. Pertinent negatives include no chest pain. Current antihyperlipidemic treatment includes statins. The current treatment provides mild improvement of lipids. Compliance problems include adherence to diet and adherence to exercise.  Risk factors for coronary artery disease include hypertension, dyslipidemia, family history and post-menopausal.  Atrial Fibrillation Presents for follow-up visit. Symptoms include hypertension, shortness of breath and tachycardia. Symptoms are negative for chest pain and palpitations. The symptoms have been stable. Past treatments include warfarin and beta blockers. Past medical history includes atrial fibrillation  and hyperlipidemia.  Gastrophageal Reflux She complains of a hoarse voice. She reports no chest pain, no coughing, no heartburn or no nausea. This is a chronic problem. The current episode started more than 1 year ago. The problem occurs rarely. The problem has been resolved. The symptoms are aggravated by certain foods. Risk factors include smoking/tobacco exposure. She has tried a PPI for the symptoms. The treatment provided significant relief.  COPD Well controlled on spirivia and advair- has  Not needed albuterol rescue inhaler. Hypokalemia K-dur daily no c/o lower ext cramping Atrial Fib Well controlled on diltiazem- also on coumadin without any bleeding    Review of Systems  Constitutional: Negative for malaise/fatigue.  HENT: Positive for hoarse voice.   Respiratory: Positive for shortness of breath. Negative for cough.   Cardiovascular: Negative for chest pain, palpitations and orthopnea.  Gastrointestinal: Negative for heartburn and nausea.  Neurological: Negative for headaches.  All other systems reviewed and are negative.       Objective:   Physical Exam  Vitals reviewed. Constitutional: She is oriented to person, place, and time. She appears well-developed and well-nourished.  HENT:  Nose: Nose normal.  Mouth/Throat: Oropharynx is clear and moist.  Eyes: EOM are normal.  Neck: Trachea normal, normal range of motion and full passive range of motion without pain. Neck supple. No JVD present. Carotid bruit is not present. No thyromegaly present.  Cardiovascular: Regular rhythm, normal heart sounds and intact distal pulses.  Exam reveals no gallop and no friction rub.   No murmur heard. Tachycardia   Pulmonary/Chest: Effort normal and breath sounds normal.  Abdominal: Soft. Bowel sounds are normal. She exhibits no distension and no mass. There is no tenderness.  Musculoskeletal: Normal range of motion.  Lymphadenopathy:    She has no cervical adenopathy.  Neurological:  She is alert and oriented to person, place, and time. She has normal reflexes.  Skin: Skin is warm and dry.  Psychiatric: She has a normal mood and affect. Her behavior is normal. Judgment and thought content normal.   BP 150/90  Pulse 58  Temp(Src) 97.4 F (36.3 C) (Oral)  Ht _0  (1.549 m)  Wt 165 lb (74.844 kg)  BMI 31.19 kg/m2  LMP 03/27/1983        Assessment & Plan:   1. Hyperlipidemia with target LDL less than 100   2. Essential hypertension, benign   3. Chronic low back pain   4. Atrial fibrillation   5. CAD (coronary artery disease) of artery bypass graft    Orders Placed This Encounter  Procedures  . CMP14+EGFR  . NMR, lipoprofile    Continue all meds Labs pending Diet and exercise encouraged Health maintenance reviewed Follow up in 3 month  [place], [name]

## 2014-01-04 NOTE — Patient Instructions (Signed)

## 2014-01-05 LAB — CMP14+EGFR
ALT: 11 IU/L (ref 0–32)
AST: 14 IU/L (ref 0–40)
Albumin/Globulin Ratio: 2 (ref 1.1–2.5)
Albumin: 3.9 g/dL (ref 3.5–4.8)
Alkaline Phosphatase: 52 IU/L (ref 39–117)
BUN/Creatinine Ratio: 14 (ref 11–26)
BUN: 14 mg/dL (ref 8–27)
CO2: 25 mmol/L (ref 18–29)
Calcium: 8.6 mg/dL (ref 8.6–10.2)
Chloride: 100 mmol/L (ref 97–108)
Creatinine, Ser: 0.97 mg/dL (ref 0.57–1.00)
GFR calc Af Amer: 65 mL/min/{1.73_m2} (ref >59)
GFR calc non Af Amer: 56 mL/min/{1.73_m2} — ABNORMAL LOW (ref >59)
Globulin, Total: 2 g/dL (ref 1.5–4.5)
Glucose: 115 mg/dL — ABNORMAL HIGH (ref 65–99)
Potassium: 4.3 mmol/L (ref 3.5–5.2)
Sodium: 143 mmol/L (ref 134–144)
Total Bilirubin: 0.5 mg/dL (ref 0.0–1.2)
Total Protein: 5.9 g/dL — ABNORMAL LOW (ref 6.0–8.5)

## 2014-01-05 LAB — NMR, LIPOPROFILE
Cholesterol: 154 mg/dL (ref <200)
HDL Cholesterol by NMR: 47 mg/dL (ref >=40)
HDL Particle Number: 28.2 umol/L — ABNORMAL LOW (ref >=30.5)
LDL Particle Number: 1034 nmol/L — ABNORMAL HIGH (ref <1000)
LDL Size: 21 nm (ref >20.5)
LDLC SERPL CALC-MCNC: 78 mg/dL (ref <100)
LP-IR Score: 29 (ref <=45)
Small LDL Particle Number: 458 nmol/L (ref <=527)
Triglycerides by NMR: 146 mg/dL (ref <150)

## 2014-01-10 ENCOUNTER — Telehealth: Payer: Self-pay | Admitting: Family Medicine

## 2014-01-10 NOTE — Telephone Encounter (Signed)
Message copied by Azalee CourseFULP, ASHLEY on Thu Jan 10, 2014 12:17 PM ------      Message from: Bennie PieriniMARTIN, MARY-MARGARET      Created: Mon Jan 07, 2014  8:07 AM       All labs look good- continue current meds and recheck in 3 months ------

## 2014-01-14 ENCOUNTER — Ambulatory Visit (INDEPENDENT_AMBULATORY_CARE_PROVIDER_SITE_OTHER): Payer: Medicare Other | Admitting: Pharmacist

## 2014-01-14 DIAGNOSIS — I4891 Unspecified atrial fibrillation: Secondary | ICD-10-CM

## 2014-01-14 LAB — POCT INR: INR: 1.4

## 2014-01-14 NOTE — Patient Instructions (Signed)
Anticoagulation Dose Instructions as of 01/14/2014     Katie SmilesSun Mon Tue Wed Thu Fri Sat   New Dose 5 mg 5 mg 5 mg 5 mg 5 mg 2.5 mg 5 mg    Description       Take 1 and 1/2 tablets today, then increase to 1 tablet daily except 1/2 on fridays.       INR was 1.4 today (too thick / low)

## 2014-01-23 ENCOUNTER — Telehealth: Payer: Self-pay | Admitting: Nurse Practitioner

## 2014-01-23 MED ORDER — HYDROCODONE-ACETAMINOPHEN 5-325 MG PO TABS: 1.0000 | ORAL_TABLET | Freq: Two times a day (BID) | ORAL | Status: DC | PRN

## 2014-01-23 NOTE — Telephone Encounter (Signed)
rx ready for pickup 

## 2014-01-24 ENCOUNTER — Ambulatory Visit (INDEPENDENT_AMBULATORY_CARE_PROVIDER_SITE_OTHER): Payer: Medicare Other | Admitting: Pharmacist

## 2014-01-24 DIAGNOSIS — I4891 Unspecified atrial fibrillation: Secondary | ICD-10-CM

## 2014-01-24 LAB — POCT INR: INR: 1.8

## 2014-01-24 NOTE — Telephone Encounter (Signed)
Pt notified to pickup RX. 

## 2014-01-24 NOTE — Patient Instructions (Signed)
Anticoagulation Dose Instructions as of 01/24/2014     Glynis SmilesSun Mon Tue Wed Thu Fri Sat   New Dose 5 mg 5 mg 5 mg 5 mg 5 mg 2.5 mg 5 mg    Description       Take 1 and 1/2 tablets today, then increase to 1 tablet daily except 1/2 on fridays.       INR was 1.8 today

## 2014-02-11 ENCOUNTER — Other Ambulatory Visit: Payer: Self-pay | Admitting: Vascular Surgery

## 2014-02-11 DIAGNOSIS — I6529 Occlusion and stenosis of unspecified carotid artery: Secondary | ICD-10-CM

## 2014-02-13 ENCOUNTER — Ambulatory Visit: Payer: Medicare Other | Admitting: Cardiology

## 2014-02-17 ENCOUNTER — Other Ambulatory Visit: Payer: Self-pay | Admitting: Nurse Practitioner

## 2014-02-18 ENCOUNTER — Ambulatory Visit (INDEPENDENT_AMBULATORY_CARE_PROVIDER_SITE_OTHER): Payer: Medicare Other | Admitting: Pharmacist

## 2014-02-18 DIAGNOSIS — I4891 Unspecified atrial fibrillation: Secondary | ICD-10-CM

## 2014-02-18 LAB — POCT INR: INR: 2

## 2014-02-18 NOTE — Patient Instructions (Signed)
Anticoagulation Dose Instructions as of 02/18/2014     Katie SmilesSun Mon Tue Wed Thu Fri Sat   New Dose 5 mg 5 mg 5 mg 5 mg 5 mg 2.5 mg 5 mg    Description       Continue 1 tablet daily except 1/2 on fridays.       INR was 2.0 today

## 2014-03-04 ENCOUNTER — Encounter: Payer: Self-pay | Admitting: Family Medicine

## 2014-03-04 ENCOUNTER — Ambulatory Visit (INDEPENDENT_AMBULATORY_CARE_PROVIDER_SITE_OTHER): Payer: Medicare Other | Admitting: Family Medicine

## 2014-03-04 VITALS — BP 127/86 | HR 95 | Temp 97.0°F | Ht 61.0 in | Wt 164.6 lb

## 2014-03-04 DIAGNOSIS — R079 Chest pain, unspecified: Secondary | ICD-10-CM

## 2014-03-04 DIAGNOSIS — R0789 Other chest pain: Secondary | ICD-10-CM

## 2014-03-04 DIAGNOSIS — M7918 Myalgia, other site: Secondary | ICD-10-CM

## 2014-03-04 MED ORDER — CYCLOBENZAPRINE HCL 10 MG PO TABS: 10.0000 mg | ORAL_TABLET | Freq: Three times a day (TID) | ORAL | Status: DC | PRN

## 2014-03-04 NOTE — Progress Notes (Signed)
   Subjective:    Patient ID: [name], female    DOB: [birth year], 80 y.o.   MRN: [id number]  HPI This 80 y.o. female presents for evaluation of chest pain under left breast.   Review of Systems C/o chest pain under left breast No SOB, HA, dizziness, vision change, N/V, diarrhea, constipation, dysuria, urinary urgency or frequency or rash.      Objective:   Physical Exam  Vital signs noted  Well developed well nourished female.  HEENT - Head atraumatic Normocephalic                Eyes - PERRLA, Conjuctiva - clear Sclera- Clear EOMI                Ears - EAC's Wnl TM's Wnl Gross Hearing WNL                Nose - Nares patent                 Throat - oropharanx wnl Respiratory - Lungs CTA bilateral Cardiac - Irregular S1 and S2 without murmur GI - Abdomen soft Nontender and bowel sounds active x 4 MS - Left Intercostal discomfort and TTP left and anterior chest below breast.     EKG - Afib without acute st-t changes Assessment & Plan:  Chest pain - Plan: EKG 12-Lead, cyclobenzaprine (FLEXERIL) 10 MG tablet  Intercostal muscle pain - Plan: cyclobenzaprine (FLEXERIL) 10 MG tablet  Take tylenol prn and stop smoking.   Katie CanterWilliam J Ayodele Hartsock [name]

## 2014-03-07 ENCOUNTER — Telehealth: Payer: Self-pay | Admitting: Nurse Practitioner

## 2014-03-07 MED ORDER — HYDROCODONE-ACETAMINOPHEN 5-325 MG PO TABS: 1.0000 | ORAL_TABLET | Freq: Two times a day (BID) | ORAL | Status: DC | PRN

## 2014-03-07 NOTE — Telephone Encounter (Signed)
rx ready for pickup 

## 2014-03-07 NOTE — Telephone Encounter (Signed)
Patient aware rx ready to be picked up 

## 2014-03-12 ENCOUNTER — Other Ambulatory Visit: Payer: Self-pay

## 2014-03-12 DIAGNOSIS — I10 Essential (primary) hypertension: Secondary | ICD-10-CM

## 2014-03-12 DIAGNOSIS — I4891 Unspecified atrial fibrillation: Secondary | ICD-10-CM

## 2014-03-12 MED ORDER — METOPROLOL TARTRATE 50 MG PO TABS: 50.0000 mg | ORAL_TABLET | Freq: Two times a day (BID) | ORAL | Status: DC

## 2014-03-12 MED ORDER — WARFARIN SODIUM 5 MG PO TABS: 5.0000 mg | ORAL_TABLET | Freq: Every day | ORAL | Status: DC

## 2014-03-12 MED ORDER — ATORVASTATIN CALCIUM 20 MG PO TABS: 20.0000 mg | ORAL_TABLET | Freq: Every day | ORAL | Status: DC

## 2014-03-12 MED ORDER — POTASSIUM CHLORIDE CRYS ER 20 MEQ PO TBCR: 20.0000 meq | EXTENDED_RELEASE_TABLET | Freq: Every day | ORAL | Status: DC

## 2014-03-12 MED ORDER — DEXLANSOPRAZOLE 60 MG PO CPDR: 60.0000 mg | DELAYED_RELEASE_CAPSULE | Freq: Every day | ORAL | Status: DC

## 2014-03-12 MED ORDER — DILTIAZEM HCL ER BEADS 180 MG PO CP24: 180.0000 mg | ORAL_CAPSULE | Freq: Every day | ORAL | Status: DC

## 2014-03-12 MED ORDER — OLMESARTAN MEDOXOMIL 40 MG PO TABS: 40.0000 mg | ORAL_TABLET | Freq: Every day | ORAL | Status: DC

## 2014-03-20 ENCOUNTER — Other Ambulatory Visit: Payer: Self-pay | Admitting: *Deleted

## 2014-03-20 MED ORDER — BUSPIRONE HCL 15 MG PO TABS: ORAL_TABLET | ORAL | Status: DC

## 2014-03-21 ENCOUNTER — Ambulatory Visit (INDEPENDENT_AMBULATORY_CARE_PROVIDER_SITE_OTHER): Payer: Medicare Other | Admitting: Pharmacist

## 2014-03-21 DIAGNOSIS — I4891 Unspecified atrial fibrillation: Secondary | ICD-10-CM

## 2014-03-21 LAB — POCT INR: INR: 4

## 2014-03-21 NOTE — Patient Instructions (Signed)
Anticoagulation Dose Instructions as of 03/21/2014     Katie SmilesSun Mon Tue Wed Thu Fri Sat   New Dose 5 mg 2.5 mg 5 mg 5 mg 5 mg 2.5 mg 5 mg    Description       No warfarin today (03/21/14) or tomorrow (03/22/14) then decrease dose to 1/2 tablet on Mondays and Fridays and 1 tablet all other days.       INR was 4.0 today (too thin)

## 2014-03-28 ENCOUNTER — Ambulatory Visit (INDEPENDENT_AMBULATORY_CARE_PROVIDER_SITE_OTHER): Payer: Medicare Other | Admitting: Pharmacist

## 2014-03-28 DIAGNOSIS — I4891 Unspecified atrial fibrillation: Secondary | ICD-10-CM

## 2014-03-28 LAB — POCT INR: INR: 1.5

## 2014-03-28 NOTE — Patient Instructions (Signed)
Anticoagulation Dose Instructions as of 03/28/2014     Glynis SmilesSun Mon Tue Wed Thu Fri Sat   New Dose 5 mg 2.5 mg 5 mg 5 mg 5 mg 2.5 mg 5 mg    Description       Take 1 and 1/2 tablets today, then start dose to 1/2 tablet on Mondays and Fridays and 1 tablet all other days.       INR was 1.5 today

## 2014-04-04 ENCOUNTER — Other Ambulatory Visit: Payer: Self-pay | Admitting: Family Medicine

## 2014-04-05 ENCOUNTER — Ambulatory Visit (INDEPENDENT_AMBULATORY_CARE_PROVIDER_SITE_OTHER): Payer: Medicare Other | Admitting: Nurse Practitioner

## 2014-04-05 ENCOUNTER — Encounter: Payer: Self-pay | Admitting: Nurse Practitioner

## 2014-04-05 VITALS — BP 120/80 | HR 95 | Temp 96.6°F | Ht 61.0 in | Wt 158.4 lb

## 2014-04-05 DIAGNOSIS — M1990 Unspecified osteoarthritis, unspecified site: Secondary | ICD-10-CM

## 2014-04-05 DIAGNOSIS — I10 Essential (primary) hypertension: Secondary | ICD-10-CM

## 2014-04-05 DIAGNOSIS — M129 Arthropathy, unspecified: Secondary | ICD-10-CM

## 2014-04-05 DIAGNOSIS — I4891 Unspecified atrial fibrillation: Secondary | ICD-10-CM

## 2014-04-05 DIAGNOSIS — E785 Hyperlipidemia, unspecified: Secondary | ICD-10-CM

## 2014-04-05 LAB — POCT INR: INR: 2.2

## 2014-04-05 MED ORDER — METHYLPREDNISOLONE ACETATE 80 MG/ML IJ SUSP
80.0000 mg | Freq: Once | INTRAMUSCULAR | Status: AC
  Administered 2014-04-05: 80 mg via INTRAMUSCULAR

## 2014-04-05 NOTE — Progress Notes (Signed)
Subjective:    Patient ID: [name], female    DOB: 06-24-1933, 80 y.o.   MRN: [id number]  Patient here today for follow up of chronic medical problems. States there are no changes since last visit and has no complaints today.  Hyperlipidemia This is a chronic problem. The current episode started more than 1 year ago. The problem is uncontrolled. Recent lipid tests were reviewed and are high. She has no history of diabetes, hypothyroidism or liver disease. Factors aggravating her hyperlipidemia include smoking and beta blockers. Pertinent negatives include no chest pain or shortness of breath. Current antihyperlipidemic treatment includes statins. The current treatment provides mild improvement of lipids. Compliance problems include adherence to diet and adherence to exercise.  Risk factors for coronary artery disease include hypertension, dyslipidemia, family history and post-menopausal.  Hypertension This is a chronic problem. The current episode started more than 1 year ago. The problem has been waxing and waning since onset. The problem is uncontrolled. Pertinent negatives include no anxiety, chest pain, headaches, malaise/fatigue, orthopnea, palpitations, peripheral edema or shortness of breath. There are no associated agents to hypertension. Risk factors for coronary artery disease include dyslipidemia, post-menopausal state, sedentary lifestyle and smoking/tobacco exposure. Past treatments include angiotensin blockers, beta blockers and diuretics. The current treatment provides moderate improvement. Compliance problems include diet and exercise.  There is no history of a thyroid problem.  Atrial Fibrillation Presents for follow-up visit. Symptoms include dizziness, hypertension and tachycardia. Symptoms are negative for chest pain, palpitations and shortness of breath. The symptoms have been stable. Past treatments include warfarin and beta blockers. Past medical history includes atrial  fibrillation and hyperlipidemia.  Gastrophageal Reflux She reports no chest pain, no coughing, no heartburn, no hoarse voice or no nausea. This is a chronic problem. The current episode started more than 1 year ago. The problem occurs rarely. The problem has been resolved. The symptoms are aggravated by certain foods. Risk factors include smoking/tobacco exposure. She has tried a PPI for the symptoms. The treatment provided significant relief.  COPD Well controlled on spirivia and advair. Has not needed albuterol rescue inhaler. Hypokalemia K-dur daily no c/o lower extremity cramping Atrial Fib Well controlled on diltiazem- also on coumadin without any bleeding. Has appt with cardiologist next week.     Review of Systems  Constitutional: Negative for malaise/fatigue.  HENT: Negative for hoarse voice.   Respiratory: Negative for cough and shortness of breath.   Cardiovascular: Negative for chest pain, palpitations and orthopnea.  Gastrointestinal: Negative for heartburn and nausea.  Neurological: Positive for dizziness. Negative for headaches.  All other systems reviewed and are negative.      Objective:   Physical Exam  Vitals reviewed. Constitutional: She is oriented to person, place, and time. She appears well-developed and well-nourished.  HENT:  Nose: Nose normal.  Mouth/Throat: Oropharynx is clear and moist.  Eyes: Pupils are equal, round, and reactive to light.  Neck: Trachea normal, normal range of motion and full passive range of motion without pain. Neck supple. No JVD present. Carotid bruit is not present. No thyromegaly present.  Cardiovascular: Regular rhythm, normal heart sounds and intact distal pulses.  Exam reveals no gallop and no friction rub.   No murmur heard. Dorsalis Pedis 2+ bil  Pulmonary/Chest: Effort normal. She has rales.  Abdominal: Soft. Bowel sounds are normal. She exhibits no distension and no mass. There is no tenderness.  Musculoskeletal: Normal  range of motion. She exhibits no edema.  Lymphadenopathy:    She has  no cervical adenopathy.  Neurological: She is alert and oriented to person, place, and time. She has normal reflexes.  Skin: Skin is warm and dry.  Psychiatric: She has a normal mood and affect. Her behavior is normal. Judgment and thought content normal.    BP 120/80  Pulse 95  Temp(Src) 96.6 F (35.9 C) (Oral)  Ht _0  (1.549 m)  Wt 158 lb 6.4 oz (71.85 kg)  BMI 29.94 kg/m2  LMP 03/27/1983   Results for orders placed in visit on 04/05/14  POCT INR      Result Value Ref Range   INR 2.2          Assessment & Plan:   1. Hyperlipidemia with target LDL less than 100   2. Atrial fibrillation   3. Essential hypertension, benign   4. A-fib   5. Chronic arthritis    Orders Placed This Encounter  Procedures  . CMP14+EGFR  . NMR, lipoprofile   Meds ordered this encounter  Medications  . methylPREDNISolone acetate (DEPO-MEDROL) injection 80 mg    Sig:    Patient wanted pain meds increased but was told no- needs to see [facility] if needs increased- patient will let us know about pain clinic Labs pending Health maintenance reviewed Diet and exercise encouraged Continue all meds Follow up  In 3 months   [place], [name]

## 2014-04-05 NOTE — Patient Instructions (Addendum)
Anticoagulation Dose Instructions as of 04/05/2014     Katie SmilesSun Mon Tue Wed Thu Fri Sat   New Dose 5 mg 2.5 mg 5 mg 5 mg 5 mg 2.5 mg 5 mg    Description       Continue current dose      Recheck in 1 week   Fat and Cholesterol Control Diet Fat and cholesterol levels in your blood and organs are influenced by your diet. High levels of fat and cholesterol may lead to diseases of the heart, small and large blood vessels, gallbladder, liver, and pancreas. CONTROLLING FAT AND CHOLESTEROL WITH DIET Although exercise and lifestyle factors are important, your diet is key. That is because certain foods are known to raise cholesterol and others to lower it. The goal is to balance foods for their effect on cholesterol and more importantly, to replace saturated and trans fat with other types of fat, such as monounsaturated fat, polyunsaturated fat, and omega-3 fatty acids. On average, a person should consume no more than 15 to 17 g of saturated fat daily. Saturated and trans fats are considered "bad" fats, and they will raise LDL cholesterol. Saturated fats are primarily found in animal products such as meats, butter, and cream. However, that does not mean you need to give up all your favorite foods. Today, there are good tasting, low-fat, low-cholesterol substitutes for most of the things you like to eat. Choose low-fat or nonfat alternatives. Choose round or loin cuts of red meat. These types of cuts are lowest in fat and cholesterol. Chicken (without the skin), fish, veal, and ground [place] breast are great choices. Eliminate fatty meats, such as hot dogs and salami. Even shellfish have little or no saturated fat. Have a 3 oz (85 g) portion when you eat lean meat, poultry, or fish. Trans fats are also called "partially hydrogenated oils." They are oils that have been scientifically manipulated so that they are solid at room temperature resulting in a longer shelf life and improved taste and texture of foods in  which they are added. Trans fats are found in stick margarine, some tub margarines, cookies, crackers, and baked goods.  When baking and cooking, oils are a great substitute for butter. The monounsaturated oils are especially beneficial since it is believed they lower LDL and raise HDL. The oils you should avoid entirely are saturated tropical oils, such as coconut and palm.  Remember to eat a lot from food groups that are naturally free of saturated and trans fat, including fish, fruit, vegetables, beans, grains (barley, rice, couscous, bulgur wheat), and pasta (without cream sauces).  IDENTIFYING FOODS THAT LOWER FAT AND CHOLESTEROL  Soluble fiber may lower your cholesterol. This type of fiber is found in fruits such as apples, vegetables such as broccoli, potatoes, and carrots, legumes such as beans, peas, and lentils, and grains such as barley. Foods fortified with plant sterols (phytosterol) may also lower cholesterol. You should eat at least 2 g per day of these foods for a cholesterol lowering effect.  Read package labels to identify low-saturated fats, trans fat free, and low-fat foods at the supermarket. Select cheeses that have only 2 to 3 g saturated fat per ounce. Use a heart-healthy tub margarine that is free of trans fats or partially hydrogenated oil. When buying baked goods (cookies, crackers), avoid partially hydrogenated oils. Breads and muffins should be made from whole grains (whole-wheat or whole oat flour, instead of "flour" or "enriched flour"). Buy non-creamy canned soups with reduced salt  and no added fats.  FOOD PREPARATION TECHNIQUES  Never deep-fry. If you must fry, either stir-fry, which uses very little fat, or use non-stick cooking sprays. When possible, broil, bake, or roast meats, and steam vegetables. Instead of putting butter or margarine on vegetables, use lemon and herbs, applesauce, and cinnamon (for squash and sweet potatoes). Use nonfat yogurt, salsa, and low-fat  dressings for salads.  LOW-SATURATED FAT / LOW-FAT FOOD SUBSTITUTES Meats / Saturated Fat (g)  Avoid: Steak, marbled (3 oz/85 g) / 11 g  Choose: Steak, lean (3 oz/85 g) / 4 g  Avoid: Hamburger (3 oz/85 g) / 7 g  Choose: Hamburger, lean (3 oz/85 g) / 5 g  Avoid: Ham (3 oz/85 g) / 6 g  Choose: Ham, lean cut (3 oz/85 g) / 2.4 g  Avoid: Chicken, with skin, dark meat (3 oz/85 g) / 4 g  Choose: Chicken, skin removed, dark meat (3 oz/85 g) / 2 g  Avoid: Chicken, with skin, light meat (3 oz/85 g) / 2.5 g  Choose: Chicken, skin removed, light meat (3 oz/85 g) / 1 g Dairy / Saturated Fat (g)  Avoid: Whole milk (1 cup) / 5 g  Choose: Low-fat milk, 2% (1 cup) / 3 g  Choose: Low-fat milk, 1% (1 cup) / 1.5 g  Choose: Skim milk (1 cup) / 0.3 g  Avoid: Hard cheese (1 oz/28 g) / 6 g  Choose: Skim milk cheese (1 oz/28 g) / 2 to 3 g  Avoid: Cottage cheese, 4% fat (1 cup) / 6.5 g  Choose: Low-fat cottage cheese, 1% fat (1 cup) / 1.5 g  Avoid: Ice cream (1 cup) / 9 g  Choose: Sherbet (1 cup) / 2.5 g  Choose: Nonfat frozen yogurt (1 cup) / 0.3 g  Choose: Frozen fruit bar / trace  Avoid: Whipped cream (1 tbs) / 3.5 g  Choose: Nondairy whipped topping (1 tbs) / 1 g Condiments / Saturated Fat (g)  Avoid: Mayonnaise (1 tbs) / 2 g  Choose: Low-fat mayonnaise (1 tbs) / 1 g  Avoid: Butter (1 tbs) / 7 g  Choose: Extra light margarine (1 tbs) / 1 g  Avoid: Coconut oil (1 tbs) / 11.8 g  Choose: Olive oil (1 tbs) / 1.8 g  Choose: Corn oil (1 tbs) / 1.7 g  Choose: Safflower oil (1 tbs) / 1.2 g  Choose: Sunflower oil (1 tbs) / 1.4 g  Choose: Soybean oil (1 tbs) / 2.4 g  Choose: Canola oil (1 tbs) / 1 g Document Released: 12/13/2005 Document Revised: 04/09/2013 Document Reviewed: 06/03/2011 Exit[employer] Patient Information 2014 [place], [place].

## 2014-04-07 LAB — CMP14+EGFR
ALT: 5 IU/L (ref 0–32)
AST: 18 IU/L (ref 0–40)
Albumin/Globulin Ratio: 1.8 (ref 1.1–2.5)
Albumin: 4 g/dL (ref 3.5–4.8)
Alkaline Phosphatase: 59 IU/L (ref 39–117)
BUN/Creatinine Ratio: 11 (ref 11–26)
BUN: 15 mg/dL (ref 8–27)
CO2: 24 mmol/L (ref 18–29)
Calcium: 9.7 mg/dL (ref 8.7–10.3)
Chloride: 99 mmol/L (ref 97–108)
Creatinine, Ser: 1.32 mg/dL — ABNORMAL HIGH (ref 0.57–1.00)
GFR calc Af Amer: 45 mL/min/{1.73_m2} — ABNORMAL LOW (ref >59)
GFR calc non Af Amer: 39 mL/min/{1.73_m2} — ABNORMAL LOW (ref >59)
Globulin, Total: 2.2 g/dL (ref 1.5–4.5)
Glucose: 122 mg/dL — ABNORMAL HIGH (ref 65–99)
Potassium: 4.9 mmol/L (ref 3.5–5.2)
Sodium: 145 mmol/L — ABNORMAL HIGH (ref 134–144)
Total Bilirubin: 0.5 mg/dL (ref 0.0–1.2)
Total Protein: 6.2 g/dL (ref 6.0–8.5)

## 2014-04-07 LAB — NMR, LIPOPROFILE
Cholesterol: 164 mg/dL (ref <200)
HDL Cholesterol by NMR: 44 mg/dL (ref >=40)
HDL Particle Number: 23.6 umol/L — ABNORMAL LOW (ref >=30.5)
LDL Particle Number: 1419 nmol/L — ABNORMAL HIGH (ref <1000)
LDL Size: 21.2 nm (ref >20.5)
LDLC SERPL CALC-MCNC: 88 mg/dL (ref <100)
LP-IR Score: 39 (ref <=45)
Small LDL Particle Number: 465 nmol/L (ref <=527)
Triglycerides by NMR: 162 mg/dL — ABNORMAL HIGH (ref <150)

## 2014-04-08 ENCOUNTER — Telehealth: Payer: Self-pay | Admitting: Nurse Practitioner

## 2014-04-08 ENCOUNTER — Other Ambulatory Visit: Payer: Self-pay | Admitting: Nurse Practitioner

## 2014-04-08 MED ORDER — ATORVASTATIN CALCIUM 40 MG PO TABS: 40.0000 mg | ORAL_TABLET | Freq: Every day | ORAL | Status: DC

## 2014-04-08 NOTE — Telephone Encounter (Signed)
Message copied by Doreatha MassedMOORE, MITZI on Mon Apr 08, 2014  2:46 PM ------      Message from: Bennie PieriniMARTIN, MARY-MARGARET      Created: Mon Apr 08, 2014  9:12 AM       Kidney and liver function stable- but creatine fluctuating- avoid all NSAIDS      LDL particle numbers have increased need to increase lipitor to 40 mg a day- rx sent to pharmacy      Continue current meds- low fat diet and exercise and recheck in 3 months             ------

## 2014-04-10 ENCOUNTER — Encounter: Payer: Self-pay | Admitting: Cardiology

## 2014-04-10 ENCOUNTER — Ambulatory Visit (INDEPENDENT_AMBULATORY_CARE_PROVIDER_SITE_OTHER): Payer: Medicare Other | Admitting: Cardiology

## 2014-04-10 VITALS — BP 136/86 | HR 97 | Ht 61.0 in | Wt 160.0 lb

## 2014-04-10 DIAGNOSIS — I10 Essential (primary) hypertension: Secondary | ICD-10-CM

## 2014-04-10 DIAGNOSIS — I4891 Unspecified atrial fibrillation: Secondary | ICD-10-CM

## 2014-04-10 DIAGNOSIS — I25810 Atherosclerosis of coronary artery bypass graft(s) without angina pectoris: Secondary | ICD-10-CM

## 2014-04-10 MED ORDER — NITROGLYCERIN 0.4 MG SL SUBL: 0.4000 mg | SUBLINGUAL_TABLET | SUBLINGUAL | Status: AC | PRN

## 2014-04-10 NOTE — Patient Instructions (Signed)
The current medical regimen is effective;  continue present plan and medications.  Follow up in 1 year with Dr Antoine PocheHochrein.  You will receive a letter in the mail 2 months before you are due.  Please call us when you receive this letter to schedule your follow up appointment.  Nitroglycerin sublingual tablets What is this medicine? NITROGLYCERIN (nye troe GLI ser in) is a type of vasodilator. It relaxes blood vessels, increasing the blood and oxygen supply to your heart. This medicine is used to relieve chest pain caused by angina. It is also used to prevent chest pain before activities like climbing stairs, going outdoors in cold weather, or sexual activity. This medicine may be used for other purposes; ask your health care provider or pharmacist if you have questions. COMMON BRAND NAME(S): Nitroquick, Nitrostat, Nitrotab What should I tell my health care provider before I take this medicine? They need to know if you have any of these conditions: -anemia -head injury, recent stroke, or bleeding in the brain -liver disease -previous heart attack -an unusual or allergic reaction to nitroglycerin, other medicines, foods, dyes, or preservatives -pregnant or trying to get pregnant -breast-feeding How should I use this medicine? Take this medicine by mouth as needed. At the first sign of an angina attack (chest pain or tightness) place one tablet under your tongue. You can also take this medicine 5 to 10 minutes before an event likely to produce chest pain. Follow the directions on the prescription label. Let the tablet dissolve under the tongue. Do not swallow whole. Replace the dose if you accidentally swallow it. It will help if your mouth is not dry. Saliva around the tablet will help it to dissolve more quickly. Do not eat or drink, smoke or chew tobacco while a tablet is dissolving. If you are not better within 5 minutes after taking ONE dose of nitroglycerin, call 9-1-1 immediately to seek  emergency medical care. Do not take more than 3 nitroglycerin tablets over 15 minutes. If you take this medicine often to relieve symptoms of angina, your doctor or health care professional may provide you with different instructions to manage your symptoms. If symptoms do not go away after following these instructions, it is important to call 9-1-1 immediately. Do not take more than 3 nitroglycerin tablets over 15 minutes. Talk to your pediatrician regarding the use of this medicine in children. Special care may be needed. Overdosage: If you think you have taken too much of this medicine contact a poison control center or emergency room at once. NOTE: This medicine is only for you. Do not share this medicine with others. What if I miss a dose? This does not apply. This medicine is only used as needed. What may interact with this medicine? Do not take this medicine with any of the following medications: -certain migraine medicines like ergotamine and dihydroergotamine (DHE) -medicines used to treat erectile dysfunction like sildenafil, tadalafil, and vardenafil -riociguat This medicine may also interact with the following medications: -alteplase -aspirin -heparin -medicines for high blood pressure -medicines for mental depression -other medicines used to treat angina -phenothiazines like chlorpromazine, mesoridazine, prochlorperazine, thioridazine This list may not describe all possible interactions. Give your health care provider a list of all the medicines, herbs, non-prescription drugs, or dietary supplements you use. Also tell them if you smoke, drink alcohol, or use illegal drugs. Some items may interact with your medicine. What should I watch for while using this medicine? Tell your doctor or health care professional if  you feel your medicine is no longer working. Keep this medicine with you at all times. Sit or lie down when you take your medicine to prevent falling if you feel dizzy or  faint after using it. Try to remain calm. This will help you to feel better faster. If you feel dizzy, take several deep breaths and lie down with your feet propped up, or bend forward with your head resting between your knees. You may get drowsy or dizzy. Do not drive, use machinery, or do anything that needs mental alertness until you know how this drug affects you. Do not stand or sit up quickly, especially if you are an older patient. This reduces the risk of dizzy or fainting spells. Alcohol can make you more drowsy and dizzy. Avoid alcoholic drinks. Do not treat yourself for coughs, colds, or pain while you are taking this medicine without asking your doctor or health care professional for advice. Some ingredients may increase your blood pressure. What side effects may I notice from receiving this medicine? Side effects that you should report to your doctor or health care professional as soon as possible: -blurred vision -dry mouth -skin rash -sweating -the feeling of extreme pressure in the head -unusually weak or tired Side effects that usually do not require medical attention (report to your doctor or health care professional if they continue or are bothersome): -flushing of the face or neck -headache -irregular heartbeat, palpitations -nausea, vomiting This list may not describe all possible side effects. Call your doctor for medical advice about side effects. You may report side effects to FDA at 1-800-FDA-1088. Where should I keep my medicine? Keep out of the reach of children. Store at room temperature between 20 and 25 degrees C (68 and 77 degrees F). Store in Retail buyeroriginal container. Protect from light and moisture. Keep tightly closed. Throw away any unused medicine after the expiration date. NOTE: This sheet is a summary. It may not cover all possible information. If you have questions about this medicine, talk to your doctor, pharmacist, or health care provider.  2014,  [employer] Standard. (2013-10-04 10:27:26)

## 2014-04-10 NOTE — Progress Notes (Signed)
HPI The patient returns for followup after having been gone from the practice for a while. She has a history of atrial fibrillation and coronary disease.  Since I last saw her she has had no further cardiovascular symptoms. She has had no chest pain she's had previously. She does not notice her atrial fibrillation. She denies any presyncope or syncope. She has no chest pressure, neck or arm discomfort. She's had no weight gain or edema. She gets with around with a cane because of back issues. She walks behind a car at a grocery store. With this she denies any cardiovascular symptoms. She unfortunately still smokes cigarettes.  No Known Allergies  Current Outpatient Prescriptions  Medication Sig Dispense Refill  . alendronate (FOSAMAX) 35 MG tablet Take 2 tablets (70 mg total) by mouth every 7 (seven) days. Take with a full glass of water on an empty stomach.  12 tablet  1  . atorvastatin (LIPITOR) 40 MG tablet Take 1 tablet (40 mg total) by mouth daily.  30 tablet  3  . busPIRone (BUSPAR) 15 MG tablet TAKE 1 TABLET (15 MG TOTAL) BY MOUTH EVERY 8 (EIGHT) HOURS AS NEEDED.  30 tablet  1  . Cholecalciferol (VITAMIN D) 2000 UNITS tablet Take 2,000 Units by mouth daily.        . cyclobenzaprine (FLEXERIL) 10 MG tablet TAKE 1 TABLET 3 TIMES A DAY AS NEEDED FOR MUSCLE SPASMS  90 tablet  0  . dexlansoprazole (DEXILANT) 60 MG capsule Take 1 capsule (60 mg total) by mouth daily.  90 capsule  1  . diltiazem (TIAZAC) 180 MG 24 hr capsule Take 1 capsule (180 mg total) by mouth daily.  90 capsule  1  . Fluticasone-Salmeterol (ADVAIR DISKUS) 250-50 MCG/DOSE AEPB Inhale 1 puff into the lungs 2 (two) times daily.  60 each  5  . furosemide (LASIX) 20 MG tablet Take 2 tablets (40 mg total) by mouth daily.  180 tablet  1  . HYDROcodone-acetaminophen (NORCO/VICODIN) 5-325 MG per tablet Take 1 tablet by mouth 2 (two) times daily as needed for moderate pain.  60 tablet  0  . metoprolol (LOPRESSOR) 50 MG tablet Take 1  tablet (50 mg total) by mouth 2 (two) times daily.  180 tablet  1  . olmesartan (BENICAR) 40 MG tablet Take 1 tablet (40 mg total) by mouth daily.  90 tablet  1  . potassium chloride SA (KLOR-CON M20) 20 MEQ tablet Take 1 tablet (20 mEq total) by mouth daily.  90 tablet  1  . tiotropium (SPIRIVA HANDIHALER) 18 MCG inhalation capsule Place 1 capsule (18 mcg total) into inhaler and inhale daily.  30 capsule  5  . warfarin (COUMADIN) 5 MG tablet Take 1 tablet (5 mg total) by mouth daily.  90 tablet  0   No current facility-administered medications for this visit.    Past Medical History  Diagnosis Date  . Hypertension   . Heart attack 5/96  . CAD (coronary artery disease) 5/96  . Edema   . Hyperlipidemia   . Chronic atrial fibrillation     Coumadin therapy   . Osteopenia   . Renal insufficiency   . Closed right ankle fracture   . CHF (congestive heart failure)   . LBP (low back pain)   . Arthritis   . Cataract   . COPD (chronic obstructive pulmonary disease)     wears O2 at home as needed  . Esophageal stricture   . Esophageal dysmotility 09/2011  seen on barium esophagram.    Past Surgical History  Procedure Laterality Date  . Heart stent      X2  . Mole removed  2013    umbilicus  . Esophagogastroduodenoscopy  2012, 05/2013,09/2013    dilation of esophagus and stricture in 09/2011, 05/2013.  meat disimpaction 09/2013  . Esophagogastroduodenoscopy N/A 10/18/2013    Procedure: ESOPHAGOGASTRODUODENOSCOPY (EGD);  Surgeon: Meryl DareMalcolm T Stark, MD;  [facility];  Service: Endoscopy;  Laterality: N/A;  possible food impaction    Family History  Problem Relation Age of Onset  . Colon cancer Neg Hx   . Esophageal cancer Neg Hx   . Stomach cancer Neg Hx   . Rectal cancer Neg Hx   . Hyperlipidemia Mother   . Hypertension Mother   . Heart disease Father   . Hyperlipidemia Father   . Cancer Sister   . Hyperlipidemia Sister   . Hypertension Sister     History    Social History  . Marital Status: Widowed    Spouse Name: N/A    Number of Children: N/A  . Years of Education: N/A   Occupational History  . retired    Social History Main Topics  . Smoking status: Current Every Day Smoker -- 0.50 packs/day    Types: Cigarettes  . Smokeless tobacco: Never Used  . Alcohol Use: No     Comment: previous  . Drug Use: No  . Sexual Activity: Not on file   Other Topics Concern  . Not on file   Social History Narrative  . No narrative on file    ROS:  Positive for asthma, headaches, reflux, occasional constipation, joint pains, leg cramping. Otherwise as stated in the HPI and negative for all other systems.   PHYSICAL EXAM BP 136/86  Pulse 97  Ht 5\' 1"  (1.549 m)  Wt 160 lb (72.576 kg)  BMI 30.25 kg/m2  LMP 03/27/1983 GENERAL:  Well appearing HEENT:  Pupils equal round and reactive, fundi not visualized, oral mucosa unremarkable NECK:  No jugular venous distention, waveform within normal limits, carotid upstroke brisk and symmetric, no bruits, no thyromegaly LYMPHATICS:  No cervical, inguinal adenopathy LUNGS:  Clear to auscultation bilaterally BACK:  No CVA tenderness, lordosis CHEST:  Unremarkable HEART:  PMI not displaced or sustained,S1 and S2 within normal limits, no S3, no clicks, no rubs, no murmurs, irregular ABD:  Flat, positive bowel sounds normal in frequency in pitch, no bruits, no rebound, no guarding, no midline pulsatile mass, no hepatomegaly, no splenomegaly EXT:  2 plus pulses throughout, no edema, no cyanosis no clubbing SKIN:  No rashes no nodules NEURO:  Cranial nerves II through XII grossly intact, motor grossly intact [facility]:  Cognitively intact, oriented to person place and time   EKG:  03/04/14, atrial fib rate 77, axis within normal limits, intervals within normal limits, no acute ST-T wave changes.  ASSESSMENT AND PLAN  ATRIAL FIB:  The patient  tolerates this rhythm and rate control and  anticoagulation. We will continue with the meds as listed.  CAD:  She has no ongoing chest pain.  No further testing is indicated.  I did give her a prescription for SLNTG.  TOBACCO:  We talked about this but I am sure that she will never quit.  DYSLIPIDEMIA:  I will defer to Rudi HeapMOORE, DONALD, MD  Lab Results  Component Value Date   CHOL 164 04/05/2014   TRIG 155* 06/25/2013   LDLCALC 80 06/25/2013   HTN:  The blood pressure is at target. No change in medications is indicated. We will continue with therapeutic lifestyle changes (TLC).

## 2014-04-15 ENCOUNTER — Telehealth: Payer: Self-pay | Admitting: Nurse Practitioner

## 2014-04-15 ENCOUNTER — Ambulatory Visit (INDEPENDENT_AMBULATORY_CARE_PROVIDER_SITE_OTHER): Payer: Medicare Other | Admitting: Pharmacist

## 2014-04-15 DIAGNOSIS — I4891 Unspecified atrial fibrillation: Secondary | ICD-10-CM

## 2014-04-15 LAB — POCT INR: INR: 2

## 2014-04-15 MED ORDER — HYDROCODONE-ACETAMINOPHEN 5-325 MG PO TABS: 1.0000 | ORAL_TABLET | Freq: Two times a day (BID) | ORAL | Status: DC | PRN

## 2014-04-15 NOTE — Telephone Encounter (Signed)
rx ready for pickup 

## 2014-04-15 NOTE — Patient Instructions (Signed)
Anticoagulation Dose Instructions as of 04/15/2014     Glynis SmilesSun Mon Tue Wed Thu Fri Sat   New Dose 5 mg 2.5 mg 5 mg 5 mg 5 mg 2.5 mg 5 mg    Description       Continue current warfarin dose of 1/2 tablet on mondays and fridays and 1 tablet all other days.       INR was 2.0 today

## 2014-04-15 NOTE — Telephone Encounter (Signed)
Patient aware to pick up 

## 2014-05-16 ENCOUNTER — Ambulatory Visit (INDEPENDENT_AMBULATORY_CARE_PROVIDER_SITE_OTHER): Payer: Medicare Other | Admitting: Pharmacist

## 2014-05-16 DIAGNOSIS — I4891 Unspecified atrial fibrillation: Secondary | ICD-10-CM

## 2014-05-16 LAB — POCT INR: INR: 5.8

## 2014-05-16 LAB — POCT HEMOGLOBIN: Hemoglobin: 16.5 g/dL — AB (ref 12.2–16.2)

## 2014-05-16 NOTE — Patient Instructions (Signed)
Anticoagulation Dose Instructions as of 05/16/2014     Katie SmilesSun Mon Tue Wed Thu Fri Sat   New Dose 5 mg 2.5 mg 5 mg 2.5 mg 5 mg 2.5 mg 5 mg    Description       No warfarin today or tomorrow.  Then decrease warfarin dose to 1/2 tablet on Mondays, Wednesdays and Fridays.  1 tablet all other days.  Add 1/2 cup serving twice a week of greens or cabbage.       INR was 5.8 today

## 2014-05-21 ENCOUNTER — Ambulatory Visit (INDEPENDENT_AMBULATORY_CARE_PROVIDER_SITE_OTHER): Payer: Medicare Other | Admitting: Pharmacist Clinician (PhC)/ Clinical Pharmacy Specialist

## 2014-05-21 DIAGNOSIS — I4891 Unspecified atrial fibrillation: Secondary | ICD-10-CM

## 2014-05-21 LAB — POCT INR: INR: 2.3

## 2014-05-28 ENCOUNTER — Other Ambulatory Visit: Payer: Self-pay | Admitting: Nurse Practitioner

## 2014-05-28 ENCOUNTER — Other Ambulatory Visit: Payer: Self-pay | Admitting: Family Medicine

## 2014-05-28 ENCOUNTER — Telehealth: Payer: Self-pay | Admitting: Nurse Practitioner

## 2014-05-28 MED ORDER — HYDROCODONE-ACETAMINOPHEN 5-325 MG PO TABS: 1.0000 | ORAL_TABLET | Freq: Two times a day (BID) | ORAL | Status: DC | PRN

## 2014-05-28 NOTE — Telephone Encounter (Signed)
rx ready for pickup 

## 2014-05-29 NOTE — Telephone Encounter (Signed)
Last ov 04/05/14

## 2014-05-29 NOTE — Telephone Encounter (Signed)
Patient aware to pick up 

## 2014-06-13 ENCOUNTER — Ambulatory Visit (INDEPENDENT_AMBULATORY_CARE_PROVIDER_SITE_OTHER): Payer: Medicare Other | Admitting: Pharmacist

## 2014-06-13 DIAGNOSIS — I4891 Unspecified atrial fibrillation: Secondary | ICD-10-CM

## 2014-06-13 LAB — POCT INR: INR: 3.4

## 2014-06-13 NOTE — Patient Instructions (Signed)
Anticoagulation Dose Instructions as of 06/13/2014     Katie SmilesSun Mon Tue Wed Thu Fri Sat   New Dose 2.5 mg 2.5 mg 5 mg 2.5 mg 5 mg 2.5 mg 5 mg    Description       No warfarin today, then start new dose of 1/2 tablet daily except 1 tablet tuesdays, thursdays and saturdays.         INR was 3.4 today

## 2014-06-14 ENCOUNTER — Encounter: Payer: Self-pay | Admitting: Family

## 2014-06-14 ENCOUNTER — Ambulatory Visit (INDEPENDENT_AMBULATORY_CARE_PROVIDER_SITE_OTHER): Payer: Medicare Other | Admitting: Family

## 2014-06-14 ENCOUNTER — Other Ambulatory Visit: Payer: Self-pay | Admitting: Nurse Practitioner

## 2014-06-14 VITALS — BP 146/96 | HR 86 | Temp 98.4°F | Ht 61.0 in | Wt 152.0 lb

## 2014-06-14 DIAGNOSIS — N390 Urinary tract infection, site not specified: Secondary | ICD-10-CM

## 2014-06-14 DIAGNOSIS — R319 Hematuria, unspecified: Secondary | ICD-10-CM

## 2014-06-14 DIAGNOSIS — R110 Nausea: Secondary | ICD-10-CM

## 2014-06-14 LAB — POCT CBC
Granulocyte percent: 86.6 %G — AB (ref 37–80)
HCT, POC: 51.2 % — AB (ref 37.7–47.9)
Hemoglobin: 16.8 g/dL — AB (ref 12.2–16.2)
Lymph, poc: 1 (ref 0.6–3.4)
MCH, POC: 29.6 pg (ref 27–31.2)
MCHC: 32.8 g/dL (ref 31.8–35.4)
MCV: 90.3 fL (ref 80–97)
MPV: 8.3 fL (ref 0–99.8)
POC Granulocyte: 9.4 — AB (ref 2–6.9)
POC LYMPH PERCENT: 9.4 %L — AB (ref 10–50)
Platelet Count, POC: 216 10*3/uL (ref 142–424)
RBC: 5.7 M/uL — AB (ref 4.04–5.48)
RDW, POC: 14.7 %
WBC: 10.8 10*3/uL — AB (ref 4.6–10.2)

## 2014-06-14 LAB — POCT URINALYSIS DIPSTICK
Bilirubin, UA: NEGATIVE
Glucose, UA: NEGATIVE
Ketones, UA: NEGATIVE
Nitrite, UA: NEGATIVE
Protein, UA: NEGATIVE
Spec Grav, UA: 1.015
Urobilinogen, UA: NEGATIVE
pH, UA: 6.5

## 2014-06-14 LAB — POCT UA - MICROSCOPIC ONLY
Casts, Ur, LPF, POC: NEGATIVE
Crystals, Ur, HPF, POC: NEGATIVE
Mucus, UA: NEGATIVE
Yeast, UA: NEGATIVE

## 2014-06-14 MED ORDER — ONDANSETRON HCL 4 MG PO TABS: 4.0000 mg | ORAL_TABLET | Freq: Three times a day (TID) | ORAL | Status: DC | PRN

## 2014-06-14 MED ORDER — NITROFURANTOIN MONOHYD MACRO 100 MG PO CAPS: 100.0000 mg | ORAL_CAPSULE | Freq: Two times a day (BID) | ORAL | Status: DC

## 2014-06-14 NOTE — Patient Instructions (Signed)

## 2014-06-14 NOTE — Progress Notes (Addendum)
   Subjective:    Patient ID: Rinaldo RatelDessie C Sider, female    DOB: 30-Jul-1933, 80 y.o.   MRN: [id number]  HPI Pt presents to the office with nausea. Pt states every morning she wakes up nauseous for the last week. Pt denies starting on any new medications. Pt denies any fatigue, fevers, abd pain, SOB, or vomiting.    Review of Systems  HENT: Negative.   Respiratory: Negative.   Cardiovascular: Negative.   Gastrointestinal: Positive for nausea. Negative for vomiting, abdominal pain, diarrhea, constipation and blood in stool.  Genitourinary: Positive for hematuria. Negative for dysuria, urgency, frequency and flank pain.  Musculoskeletal: Negative.   Skin: Negative.   All other systems reviewed and are negative.      Objective:   Physical Exam  Vitals reviewed. Constitutional: She is oriented to person, place, and time. She appears well-developed and well-nourished. No distress.  Cardiovascular: Normal rate, regular rhythm, normal heart sounds and intact distal pulses.   No murmur heard. Pulmonary/Chest: Effort normal and breath sounds normal. No respiratory distress. She has no wheezes.  Abdominal: Soft. Bowel sounds are normal. She exhibits no distension. There is no tenderness.  Musculoskeletal: Normal range of motion. She exhibits no edema and no tenderness.  Neurological: She is alert and oriented to person, place, and time. She has normal reflexes. No cranial nerve deficit.  Skin: Skin is warm and dry.  Psychiatric: She has a normal mood and affect. Her behavior is normal. Judgment and thought content normal.    BP 146/96  Pulse 86  Temp(Src) 98.4 F (36.9 C) (Oral)  Ht 5\' 1"  (1.549 m)  Wt 152 lb (68.947 kg)  BMI 28.74 kg/m2  LMP 03/27/1983       Assessment & Plan:  1. Nausea alone - POCT CBC - ondansetron (ZOFRAN) 4 MG tablet; Take 1 tablet (4 mg total) by mouth every 8 (eight) hours as needed for nausea or vomiting.  Dispense: 30 tablet; Refill: 2  2. Hematuria -  POCT UA - Microscopic Only - POCT urinalysis dipstick  3. Urinary tract infection with hematuria, site unspecified -Force fluids AZO over the counter X2 days RTO prn - nitrofurantoin, macrocrystal-monohydrate, (MACROBID) 100 MG capsule; Take 1 capsule (100 mg total) by mouth 2 (two) times daily.  Dispense: 10 capsule; Refill: 0  Jannifer Rodneyhristy Hawks, [name]

## 2014-06-18 ENCOUNTER — Ambulatory Visit: Payer: Medicare Other | Admitting: Vascular Surgery

## 2014-06-18 ENCOUNTER — Other Ambulatory Visit (HOSPITAL_COMMUNITY): Payer: Medicare Other

## 2014-06-27 ENCOUNTER — Ambulatory Visit (INDEPENDENT_AMBULATORY_CARE_PROVIDER_SITE_OTHER): Payer: Medicare Other | Admitting: Pharmacist

## 2014-06-27 ENCOUNTER — Telehealth: Payer: Self-pay | Admitting: *Deleted

## 2014-06-27 DIAGNOSIS — K921 Melena: Secondary | ICD-10-CM

## 2014-06-27 DIAGNOSIS — I4891 Unspecified atrial fibrillation: Secondary | ICD-10-CM

## 2014-06-27 DIAGNOSIS — R319 Hematuria, unspecified: Secondary | ICD-10-CM

## 2014-06-27 LAB — POCT URINALYSIS DIPSTICK
Bilirubin, UA: NEGATIVE
Glucose, UA: NEGATIVE
Ketones, UA: NEGATIVE
Nitrite, UA: NEGATIVE
Protein, UA: NEGATIVE
Spec Grav, UA: <=1.005
Urobilinogen, UA: NEGATIVE
pH, UA: 7

## 2014-06-27 LAB — POCT UA - MICROSCOPIC ONLY
Casts, Ur, LPF, POC: NEGATIVE
Crystals, Ur, HPF, POC: NEGATIVE
Mucus, UA: NEGATIVE
Yeast, UA: NEGATIVE

## 2014-06-27 LAB — POCT INR: INR: 1.9

## 2014-06-27 MED ORDER — CIPROFLOXACIN HCL 500 MG PO TABS: 500.0000 mg | ORAL_TABLET | Freq: Two times a day (BID) | ORAL | Status: DC

## 2014-06-27 NOTE — Patient Instructions (Signed)
Anticoagulation Dose Instructions as of 06/27/2014     Glynis SmilesSun Mon Tue Wed Thu Fri Sat   New Dose 2.5 mg 2.5 mg 5 mg 2.5 mg 5 mg 2.5 mg 5 mg    Description       Continue warfarin 5mg  dose of 1/2 tablet daily except 1 tablet tuesdays, thursdays and saturdays.         INR was 1.9 today

## 2014-06-27 NOTE — Telephone Encounter (Signed)
Patient seen Tammy today for a Protime and stated that she still was seeing blood when she went to the bathroom. Tammy ordered a urine and it still showed moderate leukocytes and moderate blood. I spoke with Jannifer Rodneyhristy Hawks, [name] and we ordered a urine culture and stated for us to order cipro 500 one bid for seven days. Patient aware and understands.

## 2014-06-27 NOTE — Progress Notes (Signed)
[facility] - she recommended cipro 500mg  bid for 7 days.  Patient was also given hemoccult cards to bring back - GI consult recommended - patient sees Dr Marina GoodellPerry.

## 2014-06-29 LAB — URINE CULTURE

## 2014-07-01 ENCOUNTER — Telehealth: Payer: Self-pay | Admitting: Pharmacist

## 2014-07-01 ENCOUNTER — Encounter: Payer: Self-pay | Admitting: Vascular Surgery

## 2014-07-01 ENCOUNTER — Other Ambulatory Visit: Payer: Self-pay

## 2014-07-01 ENCOUNTER — Other Ambulatory Visit: Payer: Medicare Other

## 2014-07-01 DIAGNOSIS — Z1212 Encounter for screening for malignant neoplasm of rectum: Secondary | ICD-10-CM

## 2014-07-01 MED ORDER — ALENDRONATE SODIUM 35 MG PO TABS: 70.0000 mg | ORAL_TABLET | ORAL | Status: DC

## 2014-07-01 NOTE — Telephone Encounter (Signed)
Urine culture showed normal flora -  No abnormal growth.   Instructed patient to finish ABX.  F/u protime 07/08/14

## 2014-07-01 NOTE — Progress Notes (Signed)
Pt came in for labs only 

## 2014-07-02 ENCOUNTER — Ambulatory Visit (INDEPENDENT_AMBULATORY_CARE_PROVIDER_SITE_OTHER): Payer: Medicare Other | Admitting: Vascular Surgery

## 2014-07-02 ENCOUNTER — Encounter: Payer: Self-pay | Admitting: Vascular Surgery

## 2014-07-02 ENCOUNTER — Ambulatory Visit (HOSPITAL_COMMUNITY)
Admission: RE | Admit: 2014-07-02 | Discharge: 2014-07-02 | Disposition: A | Discharge status: Home / Self Care | Payer: Medicare Other | Source: Ambulatory Visit | Attending: Vascular Surgery | Admitting: Vascular Surgery

## 2014-07-02 VITALS — BP 135/78 | HR 68 | Ht 61.0 in | Wt 154.6 lb

## 2014-07-02 DIAGNOSIS — I6529 Occlusion and stenosis of unspecified carotid artery: Secondary | ICD-10-CM | POA: Insufficient documentation

## 2014-07-02 NOTE — Progress Notes (Signed)
Subjective:     Patient ID: Katie RatelDessie C Melendez, female   DOB: Aug 17, 1933, 80 y.o.   MRN: [id number]  HPI this 80 year old female returns for continued followup regarding her carotid occlusive disease. She has known left ICA occlusion. She has no history of stroke or lateralizing weakness or aphasia or amaurosis fugax or diplopia or blurred vision or syncope. She has a known mild right ICA stenosis which remains asymptomatic. She does not take daily aspirin.  Past Medical History  Diagnosis Date  . Hypertension   . CAD (coronary artery disease) 5/96  . Edema   . Hyperlipidemia   . Chronic atrial fibrillation     Coumadin therapy   . Osteopenia   . Renal insufficiency   . Closed right ankle fracture   . CHF (congestive heart failure)   . LBP (low back pain)   . Arthritis   . Cataract   . COPD (chronic obstructive pulmonary disease)     wears O2 at home as needed  . Esophageal stricture   . Esophageal dysmotility 09/2011    seen on barium esophagram.    History  Substance Use Topics  . Smoking status: Current Every Day Smoker -- 0.50 packs/day    Types: Cigarettes  . Smokeless tobacco: Never Used  . Alcohol Use: No     Comment: previous    Family History  Problem Relation Age of Onset  . Colon cancer Neg Hx   . Esophageal cancer Neg Hx   . Stomach cancer Neg Hx   . Rectal cancer Neg Hx   . Hyperlipidemia Mother   . Hypertension Mother   . Hyperlipidemia Father   . Cancer Sister   . Hyperlipidemia Sister   . Hypertension Sister     No Known Allergies  Current outpatient prescriptions:alendronate (FOSAMAX) 35 MG tablet, Take 2 tablets (70 mg total) by mouth every 7 (seven) days. Take with a full glass of water on an empty stomach., Disp: 12 tablet, Rfl: 4;  atorvastatin (LIPITOR) 40 MG tablet, Take 1 tablet (40 mg total) by mouth daily., Disp: 30 tablet, Rfl: 3;  busPIRone (BUSPAR) 15 MG tablet, TAKE 1 TABLET (15 MG TOTAL) BY MOUTH EVERY 8 (EIGHT) HOURS AS NEEDED., Disp: 30  tablet, Rfl: 1 Cholecalciferol (VITAMIN D) 2000 UNITS tablet, Take 2,000 Units by mouth daily.  , Disp: , Rfl: ;  ciprofloxacin (CIPRO) 500 MG tablet, Take 1 tablet (500 mg total) by mouth 2 (two) times daily., Disp: 14 tablet, Rfl: 0;  dexlansoprazole (DEXILANT) 60 MG capsule, Take 1 capsule (60 mg total) by mouth daily., Disp: 90 capsule, Rfl: 1;  diltiazem (TIAZAC) 180 MG 24 hr capsule, Take 1 capsule (180 mg total) by mouth daily., Disp: 90 capsule, Rfl: 1 Fluticasone-Salmeterol (ADVAIR DISKUS) 250-50 MCG/DOSE AEPB, Inhale 1 puff into the lungs 2 (two) times daily., Disp: 60 each, Rfl: 5;  furosemide (LASIX) 20 MG tablet, TAKE 2 TABLETS (40 MG TOTAL) BY MOUTH DAILY., Disp: 180 tablet, Rfl: 1;  HYDROcodone-acetaminophen (NORCO/VICODIN) 5-325 MG per tablet, Take 1 tablet by mouth 2 (two) times daily as needed for moderate pain., Disp: 60 tablet, Rfl: 0 metoprolol (LOPRESSOR) 50 MG tablet, Take 1 tablet (50 mg total) by mouth 2 (two) times daily., Disp: 180 tablet, Rfl: 1;  nitrofurantoin, macrocrystal-monohydrate, (MACROBID) 100 MG capsule, Take 1 capsule (100 mg total) by mouth 2 (two) times daily., Disp: 10 capsule, Rfl: 0;  nitroGLYCERIN (NITROSTAT) 0.4 MG SL tablet, Place 1 tablet (0.4 mg total) under the tongue every  5 (five) minutes as needed for chest pain., Disp: 25 tablet, Rfl: 6 olmesartan (BENICAR) 40 MG tablet, Take 1 tablet (40 mg total) by mouth daily., Disp: 90 tablet, Rfl: 1;  ondansetron (ZOFRAN) 4 MG tablet, Take 1 tablet (4 mg total) by mouth every 8 (eight) hours as needed for nausea or vomiting., Disp: 30 tablet, Rfl: 2;  potassium chloride SA (KLOR-CON M20) 20 MEQ tablet, Take 1 tablet (20 mEq total) by mouth daily., Disp: 90 tablet, Rfl: 1 tiotropium (SPIRIVA HANDIHALER) 18 MCG inhalation capsule, Place 1 capsule (18 mcg total) into inhaler and inhale daily., Disp: 30 capsule, Rfl: 5;  warfarin (COUMADIN) 5 MG tablet, Take 1 tablet (5 mg total) by mouth daily., Disp: 90 tablet, Rfl: 0;   cyclobenzaprine (FLEXERIL) 10 MG tablet, TAKE 1 TABLET 3 TIMES A DAY AS NEEDED FOR MUSCLE SPASMS, Disp: 90 tablet, Rfl: 1  BP 135/78  Pulse 68  Ht 5\' 1"  (1.549 m)  Wt 154 lb 9.6 oz (70.126 kg)  BMI 29.23 kg/m2  SpO2 95%  LMP 03/27/1983  Body mass index is 29.23 kg/(m^2).           Review of Systems denies chest pain, dyspnea on exertion, PND, orthopnea. Walks with a cane. His chronic atrial fib and is on Coumadin therapy. Has chronic arthritis in her back causing chronic back discomfort. Other systems negative and a complete review of system     Objective:   Physical Exam BP 135/78  Pulse 68  Ht 5\' 1"  (1.549 m)  Wt 154 lb 9.6 oz (70.126 kg)  BMI 29.23 kg/m2  SpO2 95%  LMP 03/27/1983  Gen.-alert and oriented x3 in no apparent distress-walks with a cane  HEENT normal for age Lungs no rhonchi or wheezing Cardiovascular regular rhythm no murmurs carotid pulses 3+ palpable no bruits audible Abdomen soft nontender no palpable masses Musculoskeletal free of  major deformities Skin clear -no rashes Neurologic normal Lower extremities 3+ femoral and dorsalis pedis pulses palpable bilaterally with no edema  Today I ordered a carotid duplex exam which I reviewed and interpreted and compared to previous studies. She has known left ICA occlusion and the right ICA stenosis has progressed slightly but continues to be in the 50% range as far severity is concerned.        Assessment:     50% right ICA stenosis and left ICA occlusion-asymptomatic    Plan:     Will continue to follow this on an annual basis unless patient develops specific neurologic symptoms

## 2014-07-03 ENCOUNTER — Telehealth: Payer: Self-pay | Admitting: Pharmacist

## 2014-07-03 LAB — FECAL OCCULT BLOOD, IMMUNOCHEMICAL: Fecal Occult Bld: POSITIVE — AB

## 2014-07-03 NOTE — Telephone Encounter (Signed)
Yes - hemocult showed blood in stool - needs follow.

## 2014-07-05 ENCOUNTER — Telehealth: Payer: Self-pay | Admitting: Nurse Practitioner

## 2014-07-05 ENCOUNTER — Other Ambulatory Visit: Payer: Self-pay

## 2014-07-05 DIAGNOSIS — I6529 Occlusion and stenosis of unspecified carotid artery: Secondary | ICD-10-CM

## 2014-07-05 NOTE — Telephone Encounter (Signed)
[name] would like to  talk  To  [name] or someone about [name]'s bowel movement . Please call her back.

## 2014-07-05 NOTE — Telephone Encounter (Signed)
Patient is taking stool softners, laxatives and she is still not having a bowel movement. Her last bowel movement was about 5 days ago. Please advise

## 2014-07-05 NOTE — Telephone Encounter (Signed)
Milk of magnesia and prune juice OTC

## 2014-07-05 NOTE — Telephone Encounter (Signed)
Patient aware.

## 2014-07-08 ENCOUNTER — Telehealth: Payer: Self-pay | Admitting: Nurse Practitioner

## 2014-07-08 ENCOUNTER — Ambulatory Visit (INDEPENDENT_AMBULATORY_CARE_PROVIDER_SITE_OTHER): Payer: Medicare Other | Admitting: Pharmacist

## 2014-07-08 ENCOUNTER — Ambulatory Visit: Payer: Medicare Other | Admitting: Nurse Practitioner

## 2014-07-08 DIAGNOSIS — I4891 Unspecified atrial fibrillation: Secondary | ICD-10-CM

## 2014-07-08 LAB — POCT HEMOGLOBIN: Hemoglobin: 15.3 g/dL (ref 12.2–16.2)

## 2014-07-08 LAB — POCT INR: INR: 1.3

## 2014-07-08 MED ORDER — HYDROCODONE-ACETAMINOPHEN 5-325 MG PO TABS: 1.0000 | ORAL_TABLET | Freq: Two times a day (BID) | ORAL | Status: DC | PRN

## 2014-07-08 NOTE — Telephone Encounter (Signed)
Already done

## 2014-07-08 NOTE — Patient Instructions (Signed)
Anticoagulation Dose Instructions as of 07/08/2014     Katie SmilesSun Mon Tue Wed Thu Fri Sat   New Dose 5 mg 2.5 mg 5 mg 2.5 mg 5 mg 2.5 mg 5 mg    Description       Take 1 and 1/2 tablets of warfarin today then increase dose to 1 tablet daily except 1/2 tablet on mondays, wednesdays and fridays.        INR was 1.3 today

## 2014-07-10 ENCOUNTER — Other Ambulatory Visit (INDEPENDENT_AMBULATORY_CARE_PROVIDER_SITE_OTHER): Payer: Medicare Other

## 2014-07-10 DIAGNOSIS — Z1212 Encounter for screening for malignant neoplasm of rectum: Secondary | ICD-10-CM

## 2014-07-12 ENCOUNTER — Other Ambulatory Visit: Payer: Self-pay | Admitting: Nurse Practitioner

## 2014-07-12 LAB — FECAL OCCULT BLOOD, IMMUNOCHEMICAL: Fecal Occult Bld: NEGATIVE

## 2014-07-15 ENCOUNTER — Encounter: Payer: Self-pay | Admitting: Family Medicine

## 2014-07-15 ENCOUNTER — Ambulatory Visit: Payer: Medicare Other | Admitting: Pharmacist

## 2014-07-15 ENCOUNTER — Ambulatory Visit (INDEPENDENT_AMBULATORY_CARE_PROVIDER_SITE_OTHER): Payer: Medicare Other

## 2014-07-15 ENCOUNTER — Ambulatory Visit (INDEPENDENT_AMBULATORY_CARE_PROVIDER_SITE_OTHER): Payer: Medicare Other | Admitting: Family Medicine

## 2014-07-15 VITALS — BP 149/84 | HR 93 | Temp 97.9°F | Ht 61.0 in | Wt 157.6 lb

## 2014-07-15 DIAGNOSIS — I4891 Unspecified atrial fibrillation: Secondary | ICD-10-CM

## 2014-07-15 DIAGNOSIS — S6990XA Unspecified injury of unspecified wrist, hand and finger(s), initial encounter: Secondary | ICD-10-CM

## 2014-07-15 DIAGNOSIS — Z9181 History of falling: Secondary | ICD-10-CM

## 2014-07-15 DIAGNOSIS — S6991XA Unspecified injury of right wrist, hand and finger(s), initial encounter: Secondary | ICD-10-CM

## 2014-07-15 LAB — POCT INR: INR: 2

## 2014-07-15 NOTE — Progress Notes (Signed)
   Subjective:    Patient ID: [name], female    DOB: Jul 20, 1933, 80 y.o.   MRN: [id number]  HPI C/o fall and right hand discomfort.   Review of Systems No chest pain, SOB, HA, dizziness, vision change, N/V, diarrhea, constipation, dysuria, urinary urgency or frequency, myalgias, arthralgias or rash.     Objective:   Physical Exam  Right hand with ecchymosis and no deformity or swelling or pain  Xray of right hand - no fracture Prelimnary reading by Angeline SlimWilliam Oxford,[name]     Assessment & Plan:  Hand injury, right, initial encounter - Plan: DG Hand Complete Right  Use heating pad and take tylenol otc  Deatra CanterWilliam J Oxford [name]

## 2014-07-15 NOTE — Patient Instructions (Signed)
Anticoagulation Dose Instructions as of 07/15/2014     Glynis SmilesSun Mon Tue Wed Thu Fri Sat   New Dose 5 mg 2.5 mg 5 mg 2.5 mg 5 mg 2.5 mg 5 mg    Description       Continue current warfarin 5mg  dose of 1 tablet daily except 1/2 tablet on mondays, wednesdays and fridays.        INR was 2.0 today

## 2014-07-15 NOTE — Addendum Note (Signed)
Addended by: Deatra CanterXFORD, WILLIAM J on: 07/15/2014 02:06 PM   Modules accepted: Orders

## 2014-07-26 ENCOUNTER — Encounter: Payer: Self-pay | Admitting: Nurse Practitioner

## 2014-07-26 ENCOUNTER — Ambulatory Visit (INDEPENDENT_AMBULATORY_CARE_PROVIDER_SITE_OTHER): Payer: Medicare Other | Admitting: Nurse Practitioner

## 2014-07-26 VITALS — BP 139/82 | HR 86 | Temp 97.3°F | Ht 61.0 in | Wt 153.0 lb

## 2014-07-26 DIAGNOSIS — M545 Low back pain, unspecified: Secondary | ICD-10-CM

## 2014-07-26 DIAGNOSIS — N3000 Acute cystitis without hematuria: Secondary | ICD-10-CM

## 2014-07-26 DIAGNOSIS — I6529 Occlusion and stenosis of unspecified carotid artery: Secondary | ICD-10-CM

## 2014-07-26 DIAGNOSIS — J441 Chronic obstructive pulmonary disease with (acute) exacerbation: Secondary | ICD-10-CM

## 2014-07-26 DIAGNOSIS — I482 Chronic atrial fibrillation, unspecified: Secondary | ICD-10-CM

## 2014-07-26 DIAGNOSIS — I10 Essential (primary) hypertension: Secondary | ICD-10-CM

## 2014-07-26 DIAGNOSIS — I4891 Unspecified atrial fibrillation: Secondary | ICD-10-CM

## 2014-07-26 DIAGNOSIS — E785 Hyperlipidemia, unspecified: Secondary | ICD-10-CM

## 2014-07-26 DIAGNOSIS — Z87440 Personal history of urinary (tract) infections: Secondary | ICD-10-CM

## 2014-07-26 LAB — POCT URINALYSIS DIPSTICK
Bilirubin, UA: NEGATIVE
Glucose, UA: NEGATIVE
Ketones, UA: NEGATIVE
Nitrite, UA: NEGATIVE
Spec Grav, UA: 1.01
Urobilinogen, UA: NEGATIVE
pH, UA: 6

## 2014-07-26 LAB — POCT UA - MICROSCOPIC ONLY
Casts, Ur, LPF, POC: NEGATIVE
Crystals, Ur, HPF, POC: NEGATIVE
Mucus, UA: NEGATIVE
Yeast, UA: NEGATIVE

## 2014-07-26 LAB — POCT INR: INR: 2.7

## 2014-07-26 MED ORDER — CIPROFLOXACIN HCL 500 MG PO TABS: 500.0000 mg | ORAL_TABLET | Freq: Two times a day (BID) | ORAL | Status: DC

## 2014-07-26 MED ORDER — METHYLPREDNISOLONE ACETATE 80 MG/ML IJ SUSP: 80.0000 mg | Freq: Once | INTRAMUSCULAR | Status: DC

## 2014-07-26 NOTE — Patient Instructions (Signed)
Anticoagulation Dose Instructions as of 07/26/2014     Katie SmilesSun Mon Tue Wed Thu Fri Sat   New Dose 5 mg 2.5 mg 5 mg 2.5 mg 5 mg 2.5 mg 5 mg    Description       Continue current warfarin 5mg  dose of 1 tablet daily except 1/2 tablet on mondays, wednesdays and fridays.        INR was 2.7 today

## 2014-07-26 NOTE — Progress Notes (Signed)
Subjective:    Patient ID: [name], female    DOB: 07-Jul-1933, 80 y.o.   MRN: [id number]  Patient here today for follow up of chronic medical problems. States there are no changes since last visit and has no complaints today.  Hyperlipidemia This is a chronic problem. The current episode started more than 1 year ago. The problem is uncontrolled. Recent lipid tests were reviewed and are high. She has no history of diabetes, hypothyroidism or liver disease. Factors aggravating her hyperlipidemia include smoking and beta blockers. Pertinent negatives include no chest pain or shortness of breath. Current antihyperlipidemic treatment includes statins. The current treatment provides mild improvement of lipids. Compliance problems include adherence to diet and adherence to exercise.  Risk factors for coronary artery disease include hypertension, dyslipidemia, family history and post-menopausal.  Hypertension This is a chronic problem. The current episode started more than 1 year ago. The problem has been waxing and waning since onset. The problem is uncontrolled. Pertinent negatives include no anxiety, chest pain, headaches, malaise/fatigue, orthopnea, palpitations, peripheral edema or shortness of breath. There are no associated agents to hypertension. Risk factors for coronary artery disease include dyslipidemia, post-menopausal state, sedentary lifestyle and smoking/tobacco exposure. Past treatments include angiotensin blockers, beta blockers and diuretics. The current treatment provides moderate improvement. Compliance problems include diet and exercise.  There is no history of a thyroid problem.  Atrial Fibrillation Presents for follow-up visit. Symptoms include dizziness, hypertension and tachycardia. Symptoms are negative for chest pain, palpitations and shortness of breath. The symptoms have been stable. Past treatments include warfarin and beta blockers. Past medical history includes atrial  fibrillation and hyperlipidemia.  Gastrophageal Reflux She reports no chest pain, no coughing, no heartburn, no hoarse voice or no nausea. This is a chronic problem. The current episode started more than 1 year ago. The problem occurs rarely. The problem has been resolved. The symptoms are aggravated by certain foods. Risk factors include smoking/tobacco exposure. She has tried a PPI for the symptoms. The treatment provided significant relief.  COPD Well controlled on spirivia and advair. Has not needed albuterol rescue inhaler. Hypokalemia K-dur daily no c/o lower extremity cramping Atrial Fib Well controlled on diltiazem- also on coumadin without any bleeding. Has appt with cardiologist next week.   Chronic back  pain She got  A depoprovera shot at last visit which really helped her back pain- would like another one.   Review of Systems  Constitutional: Negative for malaise/fatigue.  HENT: Negative for hoarse voice.   Respiratory: Negative for cough and shortness of breath.   Cardiovascular: Negative for chest pain, palpitations and orthopnea.  Gastrointestinal: Negative for heartburn and nausea.  Neurological: Positive for dizziness. Negative for headaches.  All other systems reviewed and are negative.      Objective:   Physical Exam  Vitals reviewed. Constitutional: She is oriented to person, place, and time. She appears well-developed and well-nourished.  HENT:  Nose: Nose normal.  Mouth/Throat: Oropharynx is clear and moist.  Eyes: Pupils are equal, round, and reactive to light.  Neck: Trachea normal, normal range of motion and full passive range of motion without pain. Neck supple. No JVD present. Carotid bruit is not present. No thyromegaly present.  Cardiovascular: Regular rhythm, normal heart sounds and intact distal pulses.  Exam reveals no gallop and no friction rub.   No murmur heard. Dorsalis Pedis 2+ bil  Pulmonary/Chest: Effort normal. She has wheezes (bil lower  bases).  Abdominal: Soft. Bowel sounds are normal. She exhibits  no distension and no mass. There is no tenderness.  Musculoskeletal: Normal range of motion. She exhibits no edema.  Lymphadenopathy:    She has no cervical adenopathy.  Neurological: She is alert and oriented to person, place, and time. She has normal reflexes.  Skin: Skin is warm and dry.  Psychiatric: She has a normal mood and affect. Her behavior is normal. Judgment and thought content normal.    BP 139/82  Pulse 86  Temp(Src) 97.3 F (36.3 C) (Oral)  Ht 5' 1"  (1.549 m)  Wt 153 lb (69.4 kg)  BMI 28.92 kg/m2  LMP 03/27/1983  Results for orders placed in visit on 07/26/14  POCT INR      Result Value Ref Range   INR 2.7    POCT UA - MICROSCOPIC ONLY      Result Value Ref Range   WBC, Ur, HPF, POC 10-15     RBC, urine, microscopic 1-5     Bacteria, U Microscopic few     Mucus, UA negative     Epithelial cells, urine per micros moderate     Crystals, Ur, HPF, POC negative     Casts, Ur, LPF, POC negative     Yeast, UA negative    POCT URINALYSIS DIPSTICK      Result Value Ref Range   Color, UA gold     Clarity, UA clear     Glucose, UA negative     Bilirubin, UA negative     Ketones, UA negative     Spec Grav, UA 1.010     Blood, UA trace     pH, UA 6.0     Protein, UA trace     Urobilinogen, UA negative     Nitrite, UA negative     Leukocytes, UA large (3+)           Assessment & Plan:   1. A-fib   2. Hyperlipidemia with target LDL less than 100   3. Occlusion and stenosis of carotid artery without mention of cerebral infarction   4. Essential hypertension, benign   5. COPD exacerbation   6. Chronic atrial fibrillation   7. Hx: UTI (urinary tract infection)   8. Atrial fibrillation, unspecified   9. Midline low back pain without sciatica   10. Acute cystitis without hematuria    Orders Placed This Encounter  Procedures  . CMP14+EGFR  . NMR, lipoprofile  . POCT UA - Microscopic Only  .  POCT urinalysis dipstick   Meds ordered this encounter  Medications  . DISCONTD: methylPREDNISolone acetate (DEPO-MEDROL) injection 80 mg    Sig:   . ciprofloxacin (CIPRO) 500 MG tablet    Sig: Take 1 tablet (500 mg total) by mouth 2 (two) times daily.    Dispense:  14 tablet    Refill:  0    Order Specific Question:  Supervising Provider    Answer:  Chipper Herb [[id number]]   Moist heat to lower back Labs pending Health maintenance reviewed Diet and exercise encouraged Continue all meds Follow up  In 3 month    [place], [name]

## 2014-07-27 LAB — NMR, LIPOPROFILE
Cholesterol: 153 mg/dL (ref 100–199)
HDL Cholesterol by NMR: 51 mg/dL (ref >39)
HDL Particle Number: 28 umol/L — ABNORMAL LOW (ref >=30.5)
LDL Particle Number: 1153 nmol/L — ABNORMAL HIGH (ref <1000)
LDL Size: 21.3 nm (ref >20.5)
LDLC SERPL CALC-MCNC: 78 mg/dL (ref 0–99)
LP-IR Score: 35 (ref <=45)
Small LDL Particle Number: 570 nmol/L — ABNORMAL HIGH (ref <=527)
Triglycerides by NMR: 121 mg/dL (ref 0–149)

## 2014-07-27 LAB — CMP14+EGFR
ALT: 6 [IU]/L (ref 0–32)
AST: 18 [IU]/L (ref 0–40)
Albumin/Globulin Ratio: 1.8 (ref 1.1–2.5)
Albumin: 4.1 g/dL (ref 3.5–4.8)
Alkaline Phosphatase: 54 [IU]/L (ref 39–117)
BUN/Creatinine Ratio: 16 (ref 11–26)
BUN: 18 mg/dL (ref 8–27)
CO2: 28 mmol/L (ref 18–29)
Calcium: 10 mg/dL (ref 8.7–10.3)
Chloride: 99 mmol/L (ref 97–108)
Creatinine, Ser: 1.14 mg/dL — ABNORMAL HIGH (ref 0.57–1.00)
GFR calc Af Amer: 53 mL/min/{1.73_m2} — ABNORMAL LOW
GFR calc non Af Amer: 46 mL/min/{1.73_m2} — ABNORMAL LOW
Globulin, Total: 2.3 g/dL (ref 1.5–4.5)
Glucose: 129 mg/dL — ABNORMAL HIGH (ref 65–99)
Potassium: 4.3 mmol/L (ref 3.5–5.2)
Sodium: 142 mmol/L (ref 134–144)
Total Bilirubin: 0.5 mg/dL (ref 0.0–1.2)
Total Protein: 6.4 g/dL (ref 6.0–8.5)

## 2014-08-02 ENCOUNTER — Encounter: Payer: Self-pay | Admitting: Nurse Practitioner

## 2014-08-02 ENCOUNTER — Ambulatory Visit (INDEPENDENT_AMBULATORY_CARE_PROVIDER_SITE_OTHER): Payer: Medicare Other | Admitting: Nurse Practitioner

## 2014-08-02 VITALS — BP 131/81 | HR 99 | Temp 97.6°F | Ht 61.0 in | Wt 156.4 lb

## 2014-08-02 DIAGNOSIS — G8929 Other chronic pain: Secondary | ICD-10-CM

## 2014-08-02 DIAGNOSIS — M545 Low back pain, unspecified: Secondary | ICD-10-CM

## 2014-08-02 MED ORDER — METHYLPREDNISOLONE ACETATE 80 MG/ML IJ SUSP
80.0000 mg | Freq: Once | INTRAMUSCULAR | Status: AC
  Administered 2014-08-02: 80 mg via INTRAMUSCULAR

## 2014-08-02 NOTE — Patient Instructions (Signed)
Back Pain, Adult Low back pain is very common. About 1 in 5 people have back pain.The cause of low back pain is rarely dangerous. The pain often gets better over time.About half of people with a sudden onset of back pain feel better in just 2 weeks. About 8 in 10 people feel better by 6 weeks.  CAUSES Some common causes of back pain include:  Strain of the muscles or ligaments supporting the spine.  Wear and tear (degeneration) of the spinal discs.  Arthritis.  Direct injury to the back. DIAGNOSIS Most of the time, the direct cause of low back pain is not known.However, back pain can be treated effectively even when the exact cause of the pain is unknown.Answering your caregiver's questions about your overall health and symptoms is one of the most accurate ways to make sure the cause of your pain is not dangerous. If your caregiver needs more information, he or she may order lab work or imaging tests (X-rays or MRIs).However, even if imaging tests show changes in your back, this usually does not require surgery. HOME CARE INSTRUCTIONS For many people, back pain returns.Since low back pain is rarely dangerous, it is often a condition that people can learn to manageon their own.   Remain active. It is stressful on the back to sit or stand in one place. Do not sit, drive, or stand in one place for more than 30 minutes at a time. Take short walks on level surfaces as soon as pain allows.Try to increase the length of time you walk each day.  Do not stay in bed.Resting more than 1 or 2 days can delay your recovery.  Do not avoid exercise or work.Your body is made to move.It is not dangerous to be active, even though your back may hurt.Your back will likely heal faster if you return to being active before your pain is gone.  Pay attention to your body when you bend and lift. Many people have less discomfortwhen lifting if they bend their knees, keep the load close to their bodies,and  avoid twisting. Often, the most comfortable positions are those that put less stress on your recovering back.  Find a comfortable position to sleep. Use a firm mattress and lie on your side with your knees slightly bent. If you lie on your back, put a pillow under your knees.  Only take over-the-counter or prescription medicines as directed by your caregiver. Over-the-counter medicines to reduce pain and inflammation are often the most helpful.Your caregiver may prescribe muscle relaxant drugs.These medicines help dull your pain so you can more quickly return to your normal activities and healthy exercise.  Put ice on the injured area.  Put ice in a plastic bag.  Place a towel between your skin and the bag.  Leave the ice on for 15-20 minutes, 03-04 times a day for the first 2 to 3 days. After that, ice and heat may be alternated to reduce pain and spasms.  Ask your caregiver about trying back exercises and gentle massage. This may be of some benefit.  Avoid feeling anxious or stressed.Stress increases muscle tension and can worsen back pain.It is important to recognize when you are anxious or stressed and learn ways to manage it.Exercise is a great option. SEEK MEDICAL CARE IF:  You have pain that is not relieved with rest or medicine.  You have pain that does not improve in 1 week.  You have new symptoms.  You are generally not feeling well. SEEK   IMMEDIATE MEDICAL CARE IF:   You have pain that radiates from your back into your legs.  You develop new bowel or bladder control problems.  You have unusual weakness or numbness in your arms or legs.  You develop nausea or vomiting.  You develop abdominal pain.  You feel faint. Document Released: 12/13/2005 Document Revised: 06/13/2012 Document Reviewed: 04/16/2014 [employer] Patient Information 2015 [employer]. This information is not intended to replace advice given to you by your health care provider. Make sure you  discuss any questions you have with your health care provider.  

## 2014-08-02 NOTE — Progress Notes (Signed)
   Subjective:    Patient ID: [name], female    DOB: [birth year], 80 y.o.   MRN: [id number]  HPI Patient in c/o back pain- was complianing when she had her followup a few weeks ago and we were going to give her a steroid shot but decided to wait until her labs were back. She is here today saying that back pain is no better and the injection has always helped her in the past. Back pain stays constant in lower back ranging from 5-8/10- aggravated by walking nad standing- sitting helps- pain does not radiate down either leg.    Review of Systems  Musculoskeletal: Positive for back pain.       Objective:   Physical Exam  Constitutional: She is oriented to person, place, and time. She appears well-developed and well-nourished.  Cardiovascular: Normal rate, regular rhythm and normal heart sounds.   Pulmonary/Chest: Effort normal and breath sounds normal.  Genitourinary:  No CVA tenderness  Musculoskeletal:  Gait slow and steady with cane for support Decreased ROM of lumbar spine due to pain on rotation (-) SLR bil Motor strength and sensation distally intact  Neurological: She is alert and oriented to person, place, and time. She has normal reflexes.  Skin: Skin is warm and dry.  Psychiatric: She has a normal mood and affect. Her behavior is normal. Judgment and thought content normal.    BP 131/81  Pulse 99  Temp(Src) 97.6 F (36.4 C) (Oral)  Ht 5\' 1"  (1.549 m)  Wt 156 lb 6.4 oz (70.943 kg)  BMI 29.57 kg/m2  LMP 03/27/1983       Assessment & Plan:   1. Chronic low back pain    Meds ordered this encounter  Medications  . methylPREDNISolone acetate (DEPO-MEDROL) injection 80 mg    Sig:   moist heat to lower back Continue pain pills as rx Keep follow up appointment in 3 months- come in PRN  Mary-Margaret Daphine DeutscherMartin, [name]

## 2014-08-12 ENCOUNTER — Ambulatory Visit (INDEPENDENT_AMBULATORY_CARE_PROVIDER_SITE_OTHER): Payer: Medicare Other | Admitting: Pharmacist

## 2014-08-12 DIAGNOSIS — I4891 Unspecified atrial fibrillation: Secondary | ICD-10-CM

## 2014-08-12 LAB — POCT INR: INR: 3.2

## 2014-08-12 NOTE — Patient Instructions (Signed)
Anticoagulation Dose Instructions as of 08/12/2014     Katie SmilesSun Mon Tue Wed Thu Fri Sat   New Dose 5 mg 2.5 mg 5 mg 2.5 mg 5 mg 2.5 mg 5 mg    Description       No warfarin today - Monday, August 17th.  Then continue current warfarin 5mg  dose of 1 tablet daily except 1/2 tablet on mondays, wednesdays and fridays.        INR was 3.2 today

## 2014-08-15 ENCOUNTER — Other Ambulatory Visit: Payer: Self-pay | Admitting: Family Medicine

## 2014-08-15 ENCOUNTER — Other Ambulatory Visit: Payer: Self-pay | Admitting: Nurse Practitioner

## 2014-08-18 ENCOUNTER — Other Ambulatory Visit: Payer: Self-pay | Admitting: Nurse Practitioner

## 2014-08-19 ENCOUNTER — Telehealth: Payer: Self-pay | Admitting: Nurse Practitioner

## 2014-08-19 ENCOUNTER — Encounter: Payer: Self-pay | Admitting: Internal Medicine

## 2014-08-19 ENCOUNTER — Ambulatory Visit (INDEPENDENT_AMBULATORY_CARE_PROVIDER_SITE_OTHER): Payer: Medicare Other | Admitting: Internal Medicine

## 2014-08-19 VITALS — BP 120/76 | HR 88 | Ht 61.0 in | Wt 150.2 lb

## 2014-08-19 DIAGNOSIS — K222 Esophageal obstruction: Secondary | ICD-10-CM

## 2014-08-19 DIAGNOSIS — K2100 Gastro-esophageal reflux disease with esophagitis, without bleeding: Secondary | ICD-10-CM

## 2014-08-19 DIAGNOSIS — K625 Hemorrhage of anus and rectum: Secondary | ICD-10-CM

## 2014-08-19 DIAGNOSIS — R195 Other fecal abnormalities: Secondary | ICD-10-CM

## 2014-08-19 DIAGNOSIS — D689 Coagulation defect, unspecified: Secondary | ICD-10-CM

## 2014-08-19 MED ORDER — HYDROCODONE-ACETAMINOPHEN 5-325 MG PO TABS: 1.0000 | ORAL_TABLET | Freq: Two times a day (BID) | ORAL | Status: DC | PRN

## 2014-08-19 NOTE — Telephone Encounter (Signed)
rx ready for pickup 

## 2014-08-19 NOTE — Progress Notes (Signed)
HISTORY OF PRESENT ILLNESS:  [name] is a 80 y.o. female with multiple significant medical problems including coronary artery disease with prior stent placement, hypertension, COPD with ongoing chronic tobacco abuse, home oxygen therapy, and chronic atrial fibrillation for which she is on Coumadin. I have seen the patient for GERD complicated by peptic stricture for which she has had recurrent food impaction. Last seen in November 2014 regarding the same. Upper endoscopy with esophageal dilation was scheduled. The patient canceled the procedure did not reschedule. She sent today regarding Hemoccult-positive stool on testing from her doctor's office. Stool is Hemoccult positive on one occasion. Hemoccult negative on another occasion. Review of outside laboratories shows a hemoglobin of 15.3 in July 2015. Patient did mention an episode of constipation associated with passage of hard stool and bright red blood on the tissue paper several times. Nothing over the past few weeks. She denies abdominal pain, weight loss, reflux symptoms, or dysphagia. She continues on Dexilant for GERD. Does not appear that she has had prior colonoscopy.  REVIEW OF SYSTEMS:  All non-GI ROS negative except for arthritis, back pain  Past Medical History  Diagnosis Date  . Hypertension   . CAD (coronary artery disease) 5/96  . Edema   . Hyperlipidemia   . Chronic atrial fibrillation     Coumadin therapy   . Osteopenia   . Renal insufficiency   . Closed right ankle fracture   . CHF (congestive heart failure)   . LBP (low back pain)   . Arthritis   . Cataract   . COPD (chronic obstructive pulmonary disease)     wears O2 at home as needed  . Esophageal stricture   . Esophageal dysmotility 09/2011    seen on barium esophagram.    Past Surgical History  Procedure Laterality Date  . Heart stent      X2  . Mole removed  2013    umbilicus  . Esophagogastroduodenoscopy  2012, 05/2013,09/2013    dilation of  esophagus and stricture in 09/2011, 05/2013.  meat disimpaction 09/2013  . Esophagogastroduodenoscopy N/A 10/18/2013    Procedure: ESOPHAGOGASTRODUODENOSCOPY (EGD);  Surgeon: Meryl Dare, MD;  Location: [facility] ENDOSCOPY;  Service: Endoscopy;  Laterality: N/A;  possible food impaction    Social History [name]  reports that she has been smoking Cigarettes.  She has been smoking about 0.50 packs per day. She has never used smokeless tobacco. She reports that she does not drink alcohol or use illicit drugs.  family history includes Cancer in her sister; Hyperlipidemia in her father, mother, and sister; Hypertension in her mother and sister. There is no history of Colon cancer, Esophageal cancer, Stomach cancer, or Rectal cancer.  No Known Allergies     PHYSICAL EXAMINATION: Vital signs: BP 120/76  Pulse 88  Ht  (1.549 m)  Wt 150 lb 3.2 oz (68.13 kg)  BMI 28.39 kg/m2  LMP 03/27/1983  Constitutional: Elderly female, chronically ill-appearing, no acute distress Psychiatric: alert and oriented x3, cooperative Eyes: extraocular movements intact, anicteric, conjunctiva pink Mouth: oral pharynx moist, no lesions Neck: supple no lymphadenopathy Cardiovascular: heart irregular rate and rhythm, no murmur Lungs: clear to auscultation bilaterally Abdomen: soft, nontender, nondistended, no obvious ascites, no peritoneal signs, normal bowel sounds, no organomegaly Rectal: External and internal hemorrhoids. No mass or tenderness. Hemoccult negative stool Extremities: no lower extremity edema bilaterally Skin: no lesions on visible extremities Neuro: No focal deficits.   ASSESSMENT:  #1. Hemoccult-positive stool. Normal hemoglobin. #2.  Minor rectal bleeding. Likely due to identified hemorrhoids #3. GERD complicated by peptic stricture. Currently asymptomatic on PPI. Patient canceled last plan EGD with dilation #4. Multiple significant medical problems   PLAN:  #1. We discussed  multiple possible causes for Hemoccult-positive stool. We also discussed various ways to evaluate for such causes. To this end, and given her comorbidities, we will set her up for virtual colonoscopy. #2. Continue PPI and chew your food carefully #3. Fiber and stool softeners as needed for constipation

## 2014-08-19 NOTE — Patient Instructions (Addendum)
You have been scheduled for a virtual colonoscopy at [facility] [place] [phone] on 08/26/14 arrive at 9:00 am  Please go by that office to pick up the prep kit today after you leave our office. CC:  Redge Gainer MD

## 2014-08-19 NOTE — Telephone Encounter (Signed)
Pt notified RX ready for pick up.

## 2014-08-26 ENCOUNTER — Inpatient Hospital Stay: Admission: RE | Admit: 2014-08-26 | Payer: Medicare Other | Source: Ambulatory Visit

## 2014-08-27 ENCOUNTER — Ambulatory Visit (INDEPENDENT_AMBULATORY_CARE_PROVIDER_SITE_OTHER): Payer: Medicare Other | Admitting: Pharmacist Clinician (PhC)/ Clinical Pharmacy Specialist

## 2014-08-27 DIAGNOSIS — I4891 Unspecified atrial fibrillation: Secondary | ICD-10-CM

## 2014-08-27 LAB — POCT INR: INR: 3.8

## 2014-09-05 ENCOUNTER — Other Ambulatory Visit: Payer: Self-pay | Admitting: Family Medicine

## 2014-09-10 ENCOUNTER — Ambulatory Visit (INDEPENDENT_AMBULATORY_CARE_PROVIDER_SITE_OTHER): Payer: Medicare Other | Admitting: Pharmacist Clinician (PhC)/ Clinical Pharmacy Specialist

## 2014-09-10 DIAGNOSIS — I4891 Unspecified atrial fibrillation: Secondary | ICD-10-CM

## 2014-09-10 LAB — POCT INR: INR: 3.9

## 2014-09-10 NOTE — Patient Instructions (Signed)
Anticoagulation Dose Instructions as of 09/10/2014     Glynis Smiles Tue Wed Thu Fri Sat   New Dose 2.5 mg 2.5 mg 2.5 mg 2.5 mg 2.5 mg 2.5 mg 2.5 mg    Description       No coumadin today and tomorrow,  then 1/2 tablet everyday of the week

## 2014-10-01 ENCOUNTER — Ambulatory Visit (INDEPENDENT_AMBULATORY_CARE_PROVIDER_SITE_OTHER): Payer: Medicare Other | Admitting: Pharmacist Clinician (PhC)/ Clinical Pharmacy Specialist

## 2014-10-01 DIAGNOSIS — I4891 Unspecified atrial fibrillation: Secondary | ICD-10-CM

## 2014-10-01 DIAGNOSIS — Z23 Encounter for immunization: Secondary | ICD-10-CM

## 2014-10-01 LAB — POCT INR: INR: 3.2

## 2014-10-01 NOTE — Patient Instructions (Signed)
Anticoagulation Dose Instructions as of 10/01/2014     Glynis SmilesSun Mon Tue Wed Thu Fri Sat   New Dose 2.5 mg 2.5 mg 0 mg 2.5 mg 2.5 mg 2.5 mg 2.5 mg    Description       No coumadin today and tomorrow,  then 1/2 tablet everyday of the week

## 2014-10-07 ENCOUNTER — Other Ambulatory Visit: Payer: Self-pay | Admitting: Family Medicine

## 2014-10-07 ENCOUNTER — Telehealth: Payer: Self-pay | Admitting: Family Medicine

## 2014-10-07 MED ORDER — HYDROCODONE-ACETAMINOPHEN 5-325 MG PO TABS: 1.0000 | ORAL_TABLET | Freq: Two times a day (BID) | ORAL | Status: DC | PRN

## 2014-10-07 NOTE — Telephone Encounter (Signed)
Patient aware.

## 2014-10-07 NOTE — Telephone Encounter (Signed)
rx ready for pickup 

## 2014-10-28 ENCOUNTER — Ambulatory Visit (INDEPENDENT_AMBULATORY_CARE_PROVIDER_SITE_OTHER): Payer: Medicare Other | Admitting: Nurse Practitioner

## 2014-10-28 ENCOUNTER — Encounter: Payer: Self-pay | Admitting: Nurse Practitioner

## 2014-10-28 ENCOUNTER — Telehealth: Payer: Self-pay | Admitting: *Deleted

## 2014-10-28 DIAGNOSIS — E876 Hypokalemia: Secondary | ICD-10-CM

## 2014-10-28 DIAGNOSIS — J411 Mucopurulent chronic bronchitis: Secondary | ICD-10-CM

## 2014-10-28 DIAGNOSIS — E785 Hyperlipidemia, unspecified: Secondary | ICD-10-CM

## 2014-10-28 DIAGNOSIS — M545 Low back pain, unspecified: Secondary | ICD-10-CM

## 2014-10-28 DIAGNOSIS — E559 Vitamin D deficiency, unspecified: Secondary | ICD-10-CM

## 2014-10-28 DIAGNOSIS — I4891 Unspecified atrial fibrillation: Secondary | ICD-10-CM | POA: Insufficient documentation

## 2014-10-28 DIAGNOSIS — R5383 Other fatigue: Secondary | ICD-10-CM

## 2014-10-28 DIAGNOSIS — I10 Essential (primary) hypertension: Secondary | ICD-10-CM

## 2014-10-28 DIAGNOSIS — K219 Gastro-esophageal reflux disease without esophagitis: Secondary | ICD-10-CM

## 2014-10-28 DIAGNOSIS — F411 Generalized anxiety disorder: Secondary | ICD-10-CM | POA: Insufficient documentation

## 2014-10-28 DIAGNOSIS — M810 Age-related osteoporosis without current pathological fracture: Secondary | ICD-10-CM

## 2014-10-28 DIAGNOSIS — R5381 Other malaise: Secondary | ICD-10-CM | POA: Insufficient documentation

## 2014-10-28 DIAGNOSIS — G8929 Other chronic pain: Secondary | ICD-10-CM

## 2014-10-28 DIAGNOSIS — E039 Hypothyroidism, unspecified: Secondary | ICD-10-CM | POA: Insufficient documentation

## 2014-10-28 LAB — POCT INR: INR: 1.3

## 2014-10-28 MED ORDER — BUSPIRONE HCL 15 MG PO TABS: ORAL_TABLET | ORAL | Status: DC

## 2014-10-28 MED ORDER — DEXLANSOPRAZOLE 60 MG PO CPDR: DELAYED_RELEASE_CAPSULE | ORAL | Status: DC

## 2014-10-28 MED ORDER — OLMESARTAN MEDOXOMIL 40 MG PO TABS: ORAL_TABLET | ORAL | Status: DC

## 2014-10-28 MED ORDER — DILTIAZEM HCL ER COATED BEADS 180 MG PO CP24: ORAL_CAPSULE | ORAL | Status: DC

## 2014-10-28 MED ORDER — ALENDRONATE SODIUM 35 MG PO TABS: 70.0000 mg | ORAL_TABLET | ORAL | Status: DC

## 2014-10-28 MED ORDER — WARFARIN SODIUM 5 MG PO TABS: ORAL_TABLET | ORAL | Status: DC

## 2014-10-28 MED ORDER — FUROSEMIDE 20 MG PO TABS: ORAL_TABLET | ORAL | Status: DC

## 2014-10-28 MED ORDER — FLUTICASONE-SALMETEROL 250-50 MCG/DOSE IN AEPB: 1.0000 | INHALATION_SPRAY | Freq: Two times a day (BID) | RESPIRATORY_TRACT | Status: DC

## 2014-10-28 MED ORDER — METOPROLOL TARTRATE 50 MG PO TABS: 50.0000 mg | ORAL_TABLET | Freq: Two times a day (BID) | ORAL | Status: DC

## 2014-10-28 MED ORDER — TIOTROPIUM BROMIDE MONOHYDRATE 18 MCG IN CAPS
ORAL_CAPSULE | RESPIRATORY_TRACT | Status: DC
Start: 2014-10-28 — End: 2015-06-16

## 2014-10-28 MED ORDER — POTASSIUM CHLORIDE CRYS ER 20 MEQ PO TBCR: 20.0000 meq | EXTENDED_RELEASE_TABLET | Freq: Every day | ORAL | Status: DC

## 2014-10-28 MED ORDER — ATORVASTATIN CALCIUM 40 MG PO TABS: ORAL_TABLET | ORAL | Status: DC

## 2014-10-28 NOTE — Telephone Encounter (Signed)
Pharmacy wants to double check on fosamax and make sure you wanted two pills instead of one. Please advise

## 2014-10-28 NOTE — Telephone Encounter (Signed)
No should on be 1 weekly

## 2014-10-28 NOTE — Addendum Note (Signed)
Addended by: Tommas OlpHANDY, ASHLEY N on: 10/28/2014 11:50 AM   Modules accepted: Orders

## 2014-10-28 NOTE — Progress Notes (Signed)
Subjective:    Patient ID: [name], female    DOB: 06-13-1933, 80 y.o.   MRN: [id number]  Patient here today for follow up of chronic medical problems. States there are no changes since last visit and has no complaints today.  Hyperlipidemia This is a chronic problem. The problem is controlled. Recent lipid tests were reviewed and are normal. She has no history of diabetes, hypothyroidism or obesity. Pertinent negatives include no chest pain or shortness of breath. Current antihyperlipidemic treatment includes statins. The current treatment provides moderate improvement of lipids. Compliance problems include adherence to exercise and adherence to diet.  Risk factors for coronary artery disease include dyslipidemia, hypertension and post-menopausal.  Hypertension This is a chronic problem. The current episode started more than 1 year ago. The problem is unchanged. The problem is controlled. Pertinent negatives include no chest pain, headaches, palpitations or shortness of breath. Risk factors for coronary artery disease include dyslipidemia, obesity and post-menopausal state. Past treatments include beta blockers and angiotensin blockers. The current treatment provides moderate improvement. Compliance problems include diet and exercise.   Atrial Fibrillation Presents for follow-up visit. Symptoms include dizziness and hypertension. Symptoms are negative for chest pain, palpitations and shortness of breath. The symptoms have been stable. Past medical history includes atrial fibrillation and hyperlipidemia.  Gastrophageal Reflux She reports no chest pain, no coughing or no nausea. This is a chronic problem. The problem occurs occasionally. The problem has been unchanged. Nothing aggravates the symptoms. Risk factors include obesity and lack of exercise. She has tried a histamine-2 antagonist for the symptoms. The treatment provided moderate relief.  COPD Well controlled on spirivia and advair. Has  not needed albuterol rescue inhaler. Hypokalemia K-dur daily no c/o lower extremity cramping Atrial Fib Well controlled on diltiazem- also on coumadin without any bleeding. Has appt with cardiologist next week.   Chronic back  pain She got  A depoprovera shot at last visit which really helped her back pain- would like another one.   Review of Systems  Respiratory: Negative for cough and shortness of breath.   Cardiovascular: Negative for chest pain and palpitations.  Gastrointestinal: Negative for nausea.  Neurological: Positive for dizziness. Negative for headaches.  All other systems reviewed and are negative.      Objective:   Physical Exam  Constitutional: She is oriented to person, place, and time. She appears well-developed and well-nourished.  HENT:  Nose: Nose normal.  Mouth/Throat: Oropharynx is clear and moist.  Eyes: EOM are normal.  Neck: Trachea normal, normal range of motion and full passive range of motion without pain. Neck supple. No JVD present. Carotid bruit is not present. No thyromegaly present.  Cardiovascular: Normal rate, regular rhythm, normal heart sounds and intact distal pulses.  Exam reveals no gallop and no friction rub.   No murmur heard. Pulmonary/Chest: Effort normal and breath sounds normal.  Abdominal: Soft. Bowel sounds are normal. She exhibits no distension and no mass. There is no tenderness.  Musculoskeletal: Normal range of motion.  Lymphadenopathy:    She has no cervical adenopathy.  Neurological: She is alert and oriented to person, place, and time. She has normal reflexes.  Skin: Skin is warm and dry.  Psychiatric: She has a normal mood and affect. Her behavior is normal. Judgment and thought content normal.    BP 112/71 mmHg  Pulse 99  Temp(Src) 98.8 F (37.1 C) (Oral)  Ht 5' 1"  (1.549 m)  Wt 155 lb 9.6 oz (70.58 kg)  BMI 29.42  kg/m2  LMP 03/27/1983  Results for orders placed or performed in visit on 10/28/14  POCT INR   Result Value Ref Range   INR 1.3          Assessment & Plan:   1. A-fib Avoid caffeine - POCT INR - CBC with Differential - warfarin (COUMADIN) 5 MG tablet; TAKE 1 TABLET (5 MG TOTAL) BY MOUTH DAILY.  Dispense: 90 tablet; Refill: 1 - diltiazem (CARDIZEM CD) 180 MG 24 hr capsule; TAKE 1 CAPSULE (180 MG TOTAL) BY MOUTH DAILY.  Dispense: 90 capsule; Refill: 1  2. Hyperlipidemia Low fat diet - Hepatic function panel - NMR, lipoprofile - atorvastatin (LIPITOR) 40 MG tablet; TAKE 1 TABLET (40 MG TOTAL) BY MOUTH DAILY.  Dispense: 90 tablet; Refill: 1  3. Hypothyroidism - Thyroid Panel With TSH  4. Malaise and fatigue - CBC with Differential - Vitamin B12  5. Vitamin D deficiency - Vit D  25 hydroxy (rtn osteoporosis monitoring)  6. Essential hypertension, benign Avoid NA+ in diet - Basic Metabolic Panel - YCX44+YJEH - metoprolol (LOPRESSOR) 50 MG tablet; Take 1 tablet (50 mg total) by mouth 2 (two) times daily.  Dispense: 180 tablet; Refill: 1 - furosemide (LASIX) 20 MG tablet; TAKE 2 TABLETS (40 MG TOTAL) BY MOUTH DAILY.  Dispense: 180 tablet; Refill: 1 - olmesartan (BENICAR) 40 MG tablet; TAKE 1 TABLET (40 MG TOTAL) BY MOUTH DAILY.  Dispense: 90 tablet; Refill: 1  7. Chronic low back pain - Fluticasone-Salmeterol (ADVAIR DISKUS) 250-50 MCG/DOSE AEPB; Inhale 1 puff into the lungs 2 (two) times daily.  Dispense: 60 each; Refill: 5  8. Mucopurulent chronic bronchitis Avoid cigarettes- stop smoking - tiotropium (SPIRIVA HANDIHALER) 18 MCG inhalation capsule; PLACE 1 CAPSULE (18 MCG TOTAL) INTO INHALER AND INHALE DAILY.  Dispense: 90 capsule; Refill: 1  9. Hypokalemia - potassium chloride SA (KLOR-CON M20) 20 MEQ tablet; Take 1 tablet (20 mEq total) by mouth daily.  Dispense: 90 tablet; Refill: 1  10. Gastroesophageal reflux disease without esophagitis Avoid spicy and fatty foods - dexlansoprazole (DEXILANT) 60 MG capsule; TAKE 1 CAPSULE (60 MG TOTAL) BY MOUTH DAILY.   Dispense: 90 capsule; Refill: 1  11. GAD (generalized anxiety disorder) Stress management - busPIRone (BUSPAR) 15 MG tablet; TAKE 1 TABLET (15 MG TOTAL) BY MOUTH EVERY 8 (EIGHT) HOURS AS NEEDED.  Dispense: 90 tablet; Refill: 1  12. Osteoporosis Weight bearing exercises - alendronate (FOSAMAX) 35 MG tablet; Take 2 tablets (70 mg total) by mouth every 7 (seven) days. Take with a full glass of water on an empty stomach.  Dispense: 12 tablet; Refill: 4    Labs pending Health maintenance reviewed Diet and exercise encouraged Continue all meds Follow up  In 3 months   [place], [name]

## 2014-10-28 NOTE — Patient Instructions (Signed)
Anticoagulation Dose Instructions as of 10/28/2014      Katie SmilesSun Mon Tue Wed Thu Fri Sat   New Dose 2.5 mg 2.5 mg 0 mg 2.5 mg 2.5 mg 5 mg 2.5 mg    Description        Take 1 tablet for next 2 days, then increase dose to 1 tablet on fridays and 1/2 tablet all other days.      INR was 1.3 today

## 2014-10-29 LAB — LIPID PANEL
Chol/HDL Ratio: 3.1 ratio units (ref 0.0–4.4)
Cholesterol, Total: 159 mg/dL (ref 100–199)
HDL: 52 mg/dL (ref >39)
LDL Calculated: 78 mg/dL (ref 0–99)
Triglycerides: 143 mg/dL (ref 0–149)
VLDL Cholesterol Cal: 29 mg/dL (ref 5–40)

## 2014-10-29 LAB — CBC WITH DIFFERENTIAL/PLATELET
Basophils Absolute: 0.1 10*3/uL (ref 0.0–0.2)
Basos: 1 %
Eos: 1 %
Eosinophils Absolute: 0.1 10*3/uL (ref 0.0–0.4)
HCT: 47.3 % — ABNORMAL HIGH (ref 34.0–46.6)
Hemoglobin: 15.9 g/dL (ref 11.1–15.9)
Immature Grans (Abs): 0 10*3/uL (ref 0.0–0.1)
Immature Granulocytes: 0 %
Lymphocytes Absolute: 1.2 10*3/uL (ref 0.7–3.1)
Lymphs: 12 %
MCH: 31.3 pg (ref 26.6–33.0)
MCHC: 33.6 g/dL (ref 31.5–35.7)
MCV: 93 fL (ref 79–97)
Monocytes Absolute: 0.6 10*3/uL (ref 0.1–0.9)
Monocytes: 6 %
Neutrophils Absolute: 8.3 10*3/uL — ABNORMAL HIGH (ref 1.4–7.0)
Neutrophils Relative %: 80 %
RBC: 5.08 x10E6/uL (ref 3.77–5.28)
RDW: 14.7 % (ref 12.3–15.4)
WBC: 10.3 10*3/uL (ref 3.4–10.8)

## 2014-10-29 LAB — HEPATIC FUNCTION PANEL
ALT: 7 IU/L (ref 0–32)
AST: 13 IU/L (ref 0–40)
Albumin: 4 g/dL (ref 3.5–4.8)
Alkaline Phosphatase: 41 IU/L (ref 39–117)
Bilirubin, Direct: 0.15 mg/dL (ref 0.00–0.40)
Total Bilirubin: 0.6 mg/dL (ref 0.0–1.2)
Total Protein: 6.1 g/dL (ref 6.0–8.5)

## 2014-10-29 LAB — BASIC METABOLIC PANEL
BUN/Creatinine Ratio: 16 (ref 11–26)
BUN: 17 mg/dL (ref 8–27)
CO2: 29 mmol/L (ref 18–29)
Calcium: 9.1 mg/dL (ref 8.7–10.3)
Chloride: 99 mmol/L (ref 97–108)
Creatinine, Ser: 1.07 mg/dL — ABNORMAL HIGH (ref 0.57–1.00)
GFR calc Af Amer: 57 mL/min/{1.73_m2} — ABNORMAL LOW (ref >59)
GFR calc non Af Amer: 49 mL/min/{1.73_m2} — ABNORMAL LOW (ref >59)
Glucose: 149 mg/dL — ABNORMAL HIGH (ref 65–99)
Potassium: 4.4 mmol/L (ref 3.5–5.2)
Sodium: 141 mmol/L (ref 134–144)

## 2014-10-29 LAB — THYROID PANEL WITH TSH
Free Thyroxine Index: 2.6 (ref 1.2–4.9)
T3 Uptake Ratio: 30 % (ref 24–39)
T4, Total: 8.5 ug/dL (ref 4.5–12.0)
TSH: 1.13 u[IU]/mL (ref 0.450–4.500)

## 2014-10-29 LAB — VITAMIN D 25 HYDROXY (VIT D DEFICIENCY, FRACTURES): Vit D, 25-Hydroxy: 34.3 ng/mL (ref 30.0–100.0)

## 2014-10-29 LAB — VITAMIN B12: Vitamin B-12: 360 pg/mL (ref 211–946)

## 2014-10-29 NOTE — Addendum Note (Signed)
Addended by: Tamera PuntWRAY, WENDY S on: 10/29/2014 08:36 AM   Modules accepted: Orders, Medications

## 2014-10-29 NOTE — Telephone Encounter (Signed)
Pharmacy aware that it should ne 70mg  1 q  week

## 2014-11-05 ENCOUNTER — Other Ambulatory Visit: Payer: Self-pay | Admitting: Nurse Practitioner

## 2014-11-05 ENCOUNTER — Ambulatory Visit (INDEPENDENT_AMBULATORY_CARE_PROVIDER_SITE_OTHER): Payer: Medicare Other | Admitting: Pharmacist Clinician (PhC)/ Clinical Pharmacy Specialist

## 2014-11-05 ENCOUNTER — Ambulatory Visit (INDEPENDENT_AMBULATORY_CARE_PROVIDER_SITE_OTHER): Payer: Medicare Other

## 2014-11-05 DIAGNOSIS — I4891 Unspecified atrial fibrillation: Secondary | ICD-10-CM

## 2014-11-05 DIAGNOSIS — F17200 Nicotine dependence, unspecified, uncomplicated: Secondary | ICD-10-CM

## 2014-11-05 DIAGNOSIS — Z720 Tobacco use: Secondary | ICD-10-CM

## 2014-11-05 LAB — POCT INR: INR: 1.2

## 2014-11-05 NOTE — Patient Instructions (Signed)
Anticoagulation Dose Instructions as of 11/05/2014      Katie SmilesSun Mon Tue Wed Thu Fri Sat   New Dose 2.5 mg 2.5 mg 5 mg 5 mg 2.5 mg 5 mg 2.5 mg    Description        Take 1 tablet for next 2 days, then increase dose to 1 tablet on fridays and 1/2 tablet all other days.

## 2014-11-06 ENCOUNTER — Telehealth: Payer: Self-pay

## 2014-11-06 NOTE — Telephone Encounter (Signed)
-----   Message from Bennie PieriniMary-Margaret Martin, [name] sent at 11/05/2014  3:26 PM EST ----- Chest x ray clear-Katie Daphine DeutscherMartin, [name]

## 2014-11-06 NOTE — Telephone Encounter (Signed)
Pt aware of CXR results.

## 2014-11-19 ENCOUNTER — Ambulatory Visit (INDEPENDENT_AMBULATORY_CARE_PROVIDER_SITE_OTHER): Payer: Medicare Other | Admitting: Pharmacist Clinician (PhC)/ Clinical Pharmacy Specialist

## 2014-11-19 DIAGNOSIS — I4891 Unspecified atrial fibrillation: Secondary | ICD-10-CM

## 2014-11-19 LAB — POCT INR: INR: 2

## 2014-11-19 NOTE — Patient Instructions (Signed)
Anticoagulation Dose Instructions as of 11/19/2014      Katie SmilesSun Mon Tue Wed Thu Fri Sat   New Dose 2.5 mg 2.5 mg 5 mg 2.5 mg 2.5 mg 5 mg 2.5 mg

## 2014-11-25 ENCOUNTER — Telehealth: Payer: Self-pay | Admitting: Nurse Practitioner

## 2014-11-25 MED ORDER — HYDROCODONE-ACETAMINOPHEN 5-325 MG PO TABS: 1.0000 | ORAL_TABLET | Freq: Two times a day (BID) | ORAL | Status: DC | PRN

## 2014-11-25 NOTE — Telephone Encounter (Signed)
Hydrocodone rx ready for pick up 

## 2014-11-26 ENCOUNTER — Telehealth: Payer: Self-pay | Admitting: *Deleted

## 2014-11-26 NOTE — Telephone Encounter (Signed)
Aware, hydrocodone script ready. 

## 2014-12-23 ENCOUNTER — Ambulatory Visit (INDEPENDENT_AMBULATORY_CARE_PROVIDER_SITE_OTHER): Payer: Medicare Other | Admitting: Family Medicine

## 2014-12-23 ENCOUNTER — Encounter: Payer: Self-pay | Admitting: Family Medicine

## 2014-12-23 VITALS — BP 159/100 | HR 81 | Temp 97.3°F | Ht 61.0 in | Wt 158.8 lb

## 2014-12-23 DIAGNOSIS — I10 Essential (primary) hypertension: Secondary | ICD-10-CM

## 2014-12-23 DIAGNOSIS — M79606 Pain in leg, unspecified: Secondary | ICD-10-CM

## 2014-12-23 NOTE — Progress Notes (Signed)
   Subjective:    Patient ID: [name], female    DOB: 18-May-1933, 80 y.o.   MRN: [id number]  HPI Patient is here for c/o lower extremity edema and pain in her legs, back, and feet when walking.  Review of Systems  Constitutional: Negative for fever.  HENT: Negative for ear pain.   Eyes: Negative for discharge.  Respiratory: Negative for cough.   Cardiovascular: Negative for chest pain.  Gastrointestinal: Negative for abdominal distention.  Endocrine: Negative for polyuria.  Genitourinary: Negative for difficulty urinating.  Musculoskeletal: Negative for gait problem and neck pain.  Skin: Negative for color change and rash.  Neurological: Negative for speech difficulty and headaches.  Psychiatric/Behavioral: Negative for agitation.       Objective:    BP 159/100 mmHg  Pulse 81  Temp(Src) 97.3 F (36.3 C) (Oral)  Ht 5\' 1"  (1.549 m)  Wt 158 lb 12.8 oz (72.031 kg)  BMI 30.02 kg/m2  LMP 03/27/1983   Physical Exam  Constitutional: She is oriented to person, place, and time.  Chronically ill appearing female.  HENT:  Head: Normocephalic and atraumatic.  Mouth/Throat: Oropharynx is clear and moist.  Eyes: Pupils are equal, round, and reactive to light.  Neck: Normal range of motion. Neck supple.  Cardiovascular: Normal rate and regular rhythm.   No murmur heard. Pulmonary/Chest: Effort normal and breath sounds normal.  Abdominal: Soft. Bowel sounds are normal. There is no tenderness.  Neurological: She is alert and oriented to person, place, and time.  Skin: Skin is warm and dry.  Bilateral pedal and pretibial edema with erythema  Psychiatric: She has a normal mood and affect.          Assessment & Plan:     ICD-9-CM ICD-10-CM   1. Pain of lower extremity, unspecified laterality 729.5 M79.606 Lower Extremity Arterial Doppler Bilateral  2. Essential hypertension, benign 401.1 I10      No Follow-up on file.  Deatra CanterWilliam J Kysean Sweet [name]

## 2014-12-24 ENCOUNTER — Ambulatory Visit (INDEPENDENT_AMBULATORY_CARE_PROVIDER_SITE_OTHER): Payer: Medicare Other | Admitting: Pharmacist Clinician (PhC)/ Clinical Pharmacy Specialist

## 2014-12-24 DIAGNOSIS — I4891 Unspecified atrial fibrillation: Secondary | ICD-10-CM

## 2015-01-03 DIAGNOSIS — J449 Chronic obstructive pulmonary disease, unspecified: Secondary | ICD-10-CM | POA: Diagnosis not present

## 2015-01-13 ENCOUNTER — Other Ambulatory Visit: Payer: Self-pay | Admitting: Nurse Practitioner

## 2015-01-13 MED ORDER — HYDROCODONE-ACETAMINOPHEN 5-325 MG PO TABS: 1.0000 | ORAL_TABLET | Freq: Two times a day (BID) | ORAL | Status: DC | PRN

## 2015-01-13 NOTE — Telephone Encounter (Signed)
Pain rx ready for pick up  

## 2015-01-13 NOTE — Telephone Encounter (Signed)
Pt aware.

## 2015-02-03 DIAGNOSIS — J449 Chronic obstructive pulmonary disease, unspecified: Secondary | ICD-10-CM | POA: Diagnosis not present

## 2015-02-04 ENCOUNTER — Ambulatory Visit (INDEPENDENT_AMBULATORY_CARE_PROVIDER_SITE_OTHER): Payer: Medicare Other | Admitting: Pharmacist Clinician (PhC)/ Clinical Pharmacy Specialist

## 2015-02-04 DIAGNOSIS — I4891 Unspecified atrial fibrillation: Secondary | ICD-10-CM

## 2015-02-04 LAB — POCT INR: INR: 2.2

## 2015-02-19 ENCOUNTER — Ambulatory Visit (INDEPENDENT_AMBULATORY_CARE_PROVIDER_SITE_OTHER): Payer: Medicare Other | Admitting: Nurse Practitioner

## 2015-02-19 ENCOUNTER — Encounter: Payer: Self-pay | Admitting: Nurse Practitioner

## 2015-02-19 VITALS — BP 157/85 | HR 79 | Temp 97.8°F | Ht 61.0 in | Wt 164.0 lb

## 2015-02-19 DIAGNOSIS — I482 Chronic atrial fibrillation, unspecified: Secondary | ICD-10-CM

## 2015-02-19 DIAGNOSIS — K219 Gastro-esophageal reflux disease without esophagitis: Secondary | ICD-10-CM

## 2015-02-19 DIAGNOSIS — I25709 Atherosclerosis of coronary artery bypass graft(s), unspecified, with unspecified angina pectoris: Secondary | ICD-10-CM | POA: Diagnosis not present

## 2015-02-19 DIAGNOSIS — F411 Generalized anxiety disorder: Secondary | ICD-10-CM | POA: Diagnosis not present

## 2015-02-19 DIAGNOSIS — I4891 Unspecified atrial fibrillation: Secondary | ICD-10-CM

## 2015-02-19 DIAGNOSIS — J449 Chronic obstructive pulmonary disease, unspecified: Secondary | ICD-10-CM | POA: Diagnosis not present

## 2015-02-19 DIAGNOSIS — E039 Hypothyroidism, unspecified: Secondary | ICD-10-CM

## 2015-02-19 DIAGNOSIS — M810 Age-related osteoporosis without current pathological fracture: Secondary | ICD-10-CM

## 2015-02-19 DIAGNOSIS — E785 Hyperlipidemia, unspecified: Secondary | ICD-10-CM

## 2015-02-19 DIAGNOSIS — J4489 Other specified chronic obstructive pulmonary disease: Secondary | ICD-10-CM

## 2015-02-19 DIAGNOSIS — I10 Essential (primary) hypertension: Secondary | ICD-10-CM

## 2015-02-19 NOTE — Progress Notes (Signed)
Subjective:    Patient ID: [name], female    DOB: [birth year], 81 y.o.   MRN: [id number]  Patient here today for follow up of chronic medical problems. States there are no changes since last visit and has no complaints today.  Hyperlipidemia This is a chronic problem. The problem is controlled. Recent lipid tests were reviewed and are normal. She has no history of diabetes, hypothyroidism or obesity. Pertinent negatives include no chest pain or shortness of breath. Current antihyperlipidemic treatment includes statins. The current treatment provides moderate improvement of lipids. Compliance problems include adherence to exercise and adherence to diet.  Risk factors for coronary artery disease include dyslipidemia, hypertension and post-menopausal.  Hypertension This is a chronic problem. The current episode started more than 1 year ago. The problem is unchanged. The problem is controlled. Pertinent negatives include no chest pain, headaches, palpitations or shortness of breath. Risk factors for coronary artery disease include dyslipidemia, obesity and post-menopausal state. Past treatments include beta blockers and angiotensin blockers. The current treatment provides moderate improvement. Compliance problems include diet and exercise.   Atrial Fibrillation Presents for follow-up visit. Symptoms include hypertension. Symptoms are negative for chest pain, palpitations and shortness of breath. The symptoms have been stable. Past medical history includes atrial fibrillation and hyperlipidemia.  Gastrophageal Reflux She reports no chest pain, no coughing or no nausea. This is a chronic problem. The problem occurs occasionally. The problem has been unchanged. Nothing aggravates the symptoms. Risk factors include obesity and lack of exercise. She has tried a histamine-2 antagonist for the symptoms. The treatment provided moderate relief.  COPD Well controlled on spirivia and advair. Has not needed  albuterol rescue inhaler. Hypokalemia K-dur daily no c/o lower extremity cramping Atrial Fib Well controlled on diltiazem- also on coumadin without any bleeding. Has appt with cardiologist next week.   Chronic back  pain She got  A depoprovera shot at last visit which really helped her back pain- would like another one.   Review of Systems  Respiratory: Negative for cough and shortness of breath.   Cardiovascular: Negative for chest pain and palpitations.  Gastrointestinal: Negative for nausea.  Neurological: Negative.  Negative for headaches.  All other systems reviewed and are negative.      Objective:   Physical Exam  Constitutional: She is oriented to person, place, and time. She appears well-developed and well-nourished.  HENT:  Nose: Nose normal.  Mouth/Throat: Oropharynx is clear and moist.  Cerumen bil ears.   Eyes: EOM are normal.  Neck: Trachea normal, normal range of motion and full passive range of motion without pain. Neck supple. No JVD present. Carotid bruit is not present. No thyromegaly present.  Cardiovascular: Normal rate, regular rhythm, normal heart sounds and intact distal pulses.  Exam reveals no gallop and no friction rub.   No murmur heard. Pulmonary/Chest: Effort normal and breath sounds normal.  Abdominal: Soft. Bowel sounds are normal. She exhibits no distension and no mass. There is no tenderness.  Musculoskeletal: Normal range of motion.  Lymphadenopathy:    She has no cervical adenopathy.  Neurological: She is alert and oriented to person, place, and time. She has normal reflexes.  Skin: Skin is warm and dry.  Psychiatric: She has a normal mood and affect. Her behavior is normal. Judgment and thought content normal.   BP 157/85 mmHg  Pulse 79  Temp(Src) 97.8 F (36.6 C) (Oral)  Ht 5' 1"  (1.549 m)  Wt 164 lb (74.39 kg)  BMI 31.00 kg/m2  LMP 03/27/1983        Assessment & Plan:   1. Hyperlipidemia with target LDL less than 100 Low fat  diet - NMR, lipoprofile  2. Essential hypertension, benign Low salt diet  CMP14+EGFR  3. Atrial fibrillation, unspecified Avoid caffeine drinks  4. Hypothyroidism, unspecified hypothyroidism type    5. GAD (generalized anxiety disorder) Stress management  6. Chronic atrial fibrillation Continue coumadin  7. Coronary artery disease involving coronary bypass graft with unspecified angina pectoris  8. COPD (chronic obstructive pulmonary disease) with chronic bronchitis Smoking cessation  9. Gastroesophageal reflux disease without esophagitis Avoid spicy foods  10. Osteoporosis    Labs pending Health maintenance reviewed Diet and exercise encouraged Continue all meds Follow up  In 3 month   [place], [name]

## 2015-02-19 NOTE — Patient Instructions (Signed)
Hypothyroidism The thyroid is a large gland located in the lower front of your neck. The thyroid gland helps control metabolism. Metabolism is how your body handles food. It controls metabolism with the hormone thyroxine. When this gland is underactive (hypothyroid), it produces too little hormone.  CAUSES These include:   Absence or destruction of thyroid tissue.  Goiter due to iodine deficiency.  Goiter due to medications.  Congenital defects (since birth).  Problems with the pituitary. This causes a lack of TSH (thyroid stimulating hormone). This hormone tells the thyroid to turn out more hormone. SYMPTOMS  Lethargy (feeling as though you have no energy)  Cold intolerance  Weight gain (in spite of normal food intake)  Dry skin  Coarse hair  Menstrual irregularity (if severe, may lead to infertility)  Slowing of thought processes Cardiac problems are also caused by insufficient amounts of thyroid hormone. Hypothyroidism in the newborn is cretinism, and is an extreme form. It is important that this form be treated adequately and immediately or it will lead rapidly to retarded physical and mental development. DIAGNOSIS  To prove hypothyroidism, your caregiver may do blood tests and ultrasound tests. Sometimes the signs are hidden. It may be necessary for your caregiver to watch this illness with blood tests either before or after diagnosis and treatment. TREATMENT  Low levels of thyroid hormone are increased by using synthetic thyroid hormone. This is a safe, effective treatment. It usually takes about four weeks to gain the full effects of the medication. After you have the full effect of the medication, it will generally take another four weeks for problems to leave. Your caregiver may start you on low doses. If you have had heart problems the dose may be gradually increased. It is generally not an emergency to get rapidly to normal. HOME CARE INSTRUCTIONS   Take your  medications as your caregiver suggests. Let your caregiver know of any medications you are taking or start taking. Your caregiver will help you with dosage schedules.  As your condition improves, your dosage needs may increase. It will be necessary to have continuing blood tests as suggested by your caregiver.  Report all suspected medication side effects to your caregiver. SEEK MEDICAL CARE IF: Seek medical care if you develop:  Sweating.  Tremulousness (tremors).  Anxiety.  Rapid weight loss.  Heat intolerance.  Emotional swings.  Diarrhea.  Weakness. SEEK IMMEDIATE MEDICAL CARE IF:  You develop chest pain, an irregular heart beat (palpitations), or a rapid heart beat. MAKE SURE YOU:   Understand these instructions.  Will watch your condition.  Will get help right away if you are not doing well or get worse. Document Released: 12/13/2005 Document Revised: 03/06/2012 Document Reviewed: 08/02/2008 Exit[employer] Patient Information 2015 [employer]. This information is not intended to replace advice given to you by your health care provider. Make sure you discuss any questions you have with your health care provider.  

## 2015-02-20 LAB — CMP14+EGFR
ALT: 5 IU/L (ref 0–32)
AST: 17 IU/L (ref 0–40)
Albumin/Globulin Ratio: 1.8 (ref 1.1–2.5)
Albumin: 3.8 g/dL (ref 3.5–4.8)
Alkaline Phosphatase: 50 IU/L (ref 39–117)
BUN/Creatinine Ratio: 13 (ref 11–26)
BUN: 13 mg/dL (ref 8–27)
Bilirubin Total: 0.4 mg/dL (ref 0.0–1.2)
CO2: 28 mmol/L (ref 18–29)
Calcium: 9.8 mg/dL (ref 8.7–10.3)
Chloride: 103 mmol/L (ref 97–108)
Creatinine, Ser: 0.98 mg/dL (ref 0.57–1.00)
GFR calc Af Amer: 63 mL/min/{1.73_m2} (ref >59)
GFR calc non Af Amer: 55 mL/min/{1.73_m2} — ABNORMAL LOW (ref >59)
Globulin, Total: 2.1 g/dL (ref 1.5–4.5)
Glucose: 101 mg/dL — ABNORMAL HIGH (ref 65–99)
Potassium: 4.7 mmol/L (ref 3.5–5.2)
Sodium: 145 mmol/L — ABNORMAL HIGH (ref 134–144)
Total Protein: 5.9 g/dL — ABNORMAL LOW (ref 6.0–8.5)

## 2015-02-20 LAB — NMR, LIPOPROFILE
Cholesterol: 141 mg/dL (ref 100–199)
HDL Cholesterol by NMR: 53 mg/dL (ref >39)
HDL Particle Number: 30.1 umol/L — ABNORMAL LOW (ref >=30.5)
LDL Particle Number: 841 nmol/L (ref <1000)
LDL Size: 20.6 nm (ref >20.5)
LDL-C: 67 mg/dL (ref 0–99)
LP-IR Score: 25 (ref <=45)
Small LDL Particle Number: 370 nmol/L (ref <=527)
Triglycerides by NMR: 105 mg/dL (ref 0–149)

## 2015-02-24 ENCOUNTER — Telehealth: Payer: Self-pay | Admitting: Nurse Practitioner

## 2015-02-24 DIAGNOSIS — J449 Chronic obstructive pulmonary disease, unspecified: Secondary | ICD-10-CM | POA: Diagnosis not present

## 2015-02-24 DIAGNOSIS — I509 Heart failure, unspecified: Secondary | ICD-10-CM | POA: Diagnosis not present

## 2015-02-24 DIAGNOSIS — B372 Candidiasis of skin and nail: Secondary | ICD-10-CM | POA: Diagnosis not present

## 2015-02-24 DIAGNOSIS — R0602 Shortness of breath: Secondary | ICD-10-CM | POA: Diagnosis not present

## 2015-02-24 NOTE — Telephone Encounter (Signed)
Stp advised no openings today, she states she will go to urgent care. I offered appt tomorrow but pt declines.

## 2015-02-26 ENCOUNTER — Ambulatory Visit (INDEPENDENT_AMBULATORY_CARE_PROVIDER_SITE_OTHER): Payer: Medicare Other | Admitting: Nurse Practitioner

## 2015-02-26 ENCOUNTER — Encounter: Payer: Self-pay | Admitting: Nurse Practitioner

## 2015-02-26 VITALS — BP 135/76 | HR 72 | Temp 97.0°F | Ht 61.0 in | Wt 156.0 lb

## 2015-02-26 DIAGNOSIS — I509 Heart failure, unspecified: Secondary | ICD-10-CM | POA: Diagnosis not present

## 2015-02-26 DIAGNOSIS — B372 Candidiasis of skin and nail: Secondary | ICD-10-CM | POA: Diagnosis not present

## 2015-02-26 DIAGNOSIS — Z09 Encounter for follow-up examination after completed treatment for conditions other than malignant neoplasm: Secondary | ICD-10-CM

## 2015-02-26 MED ORDER — TORSEMIDE 20 MG PO TABS: ORAL_TABLET | ORAL | Status: DC

## 2015-02-26 NOTE — Patient Instructions (Signed)

## 2015-02-26 NOTE — Progress Notes (Signed)
   Subjective:    Patient ID: [name], female    DOB: Jul 02, 1933, 81 y.o.   MRN: [id number]  HPI Patient brought in by daughter- They tried to get appointment with [place] but could not get appointment so daughter tok her to ER. Said that yeast rash under her breast was getting worse. She was also getting very SOB. She was dx with CHF and yeast. Stopped her lasix and her benicar ( hasn't had benicar since September anyway). Was put on torsemide for 10 days and then needs labs drawn. Say sthat she is breathing better on torsemide. Still has mild swelling.    Review of Systems  Constitutional: Negative.   HENT: Negative.   Respiratory: Positive for shortness of breath.   Cardiovascular: Positive for leg swelling. Negative for chest pain and palpitations.  Gastrointestinal: Negative.   Genitourinary: Negative.   Neurological: Negative.   Psychiatric/Behavioral: Negative.   All other systems reviewed and are negative.      Objective:   Physical Exam  Constitutional: She appears well-developed and well-nourished.  Cardiovascular: Normal rate and normal heart sounds.   Pulmonary/Chest: She has rales (fine in bil bases).  Musculoskeletal: She exhibits edema (1+ edema bil lower ext).  Skin: Skin is warm and dry.  Psychiatric: She has a normal mood and affect. Her behavior is normal. Judgment and thought content normal.   BP 135/76 mmHg  Pulse 72  Temp(Src) 97 F (36.1 C) (Oral)  Ht _0  (1.549 m)  Wt 156 lb (70.761 kg)  BMI 29.49 kg/m2  LMP 03/27/1983        Assessment & Plan:  1. Hospital discharge follow-up Reviewed hospital records  2. Chronic congestive heart failure, unspecified congestive heart failure type elevate legs when sitting - torsemide (DEMADEX) 20 MG tablet; 2 po BID  Dispense: 120 tablet; Refill: 3 - CMP14+EGFR - Brain natriuretic peptide  3. Cutaneous candidiasis Keep affected area clean and dry  RTO prn  Mary-Margaret Hassell Done, [name]

## 2015-02-27 LAB — CMP14+EGFR
ALT: 4 IU/L (ref 0–32)
AST: 14 IU/L (ref 0–40)
Albumin/Globulin Ratio: 1.6 (ref 1.1–2.5)
Albumin: 3.8 g/dL (ref 3.5–4.8)
Alkaline Phosphatase: 52 IU/L (ref 39–117)
BUN/Creatinine Ratio: 18 (ref 11–26)
BUN: 26 mg/dL (ref 8–27)
Bilirubin Total: 0.3 mg/dL (ref 0.0–1.2)
CO2: 30 mmol/L — ABNORMAL HIGH (ref 18–29)
Calcium: 9.4 mg/dL (ref 8.7–10.3)
Chloride: 98 mmol/L (ref 97–108)
Creatinine, Ser: 1.48 mg/dL — ABNORMAL HIGH (ref 0.57–1.00)
GFR calc Af Amer: 39 mL/min/{1.73_m2} — ABNORMAL LOW (ref >59)
GFR calc non Af Amer: 33 mL/min/{1.73_m2} — ABNORMAL LOW (ref >59)
Globulin, Total: 2.4 g/dL (ref 1.5–4.5)
Glucose: 110 mg/dL — ABNORMAL HIGH (ref 65–99)
Potassium: 4.6 mmol/L (ref 3.5–5.2)
Sodium: 144 mmol/L (ref 134–144)
Total Protein: 6.2 g/dL (ref 6.0–8.5)

## 2015-02-27 LAB — BRAIN NATRIURETIC PEPTIDE: BNP: 126.3 pg/mL — ABNORMAL HIGH (ref 0.0–100.0)

## 2015-03-04 DIAGNOSIS — J449 Chronic obstructive pulmonary disease, unspecified: Secondary | ICD-10-CM | POA: Diagnosis not present

## 2015-03-11 ENCOUNTER — Other Ambulatory Visit: Payer: Self-pay | Admitting: Nurse Practitioner

## 2015-03-11 MED ORDER — HYDROCODONE-ACETAMINOPHEN 5-325 MG PO TABS: 1.0000 | ORAL_TABLET | Freq: Two times a day (BID) | ORAL | Status: DC | PRN

## 2015-03-11 NOTE — Telephone Encounter (Signed)
Hydrocodone rx ready for pick up 

## 2015-03-18 ENCOUNTER — Ambulatory Visit (INDEPENDENT_AMBULATORY_CARE_PROVIDER_SITE_OTHER): Payer: Medicare Other | Admitting: Pharmacist Clinician (PhC)/ Clinical Pharmacy Specialist

## 2015-03-18 DIAGNOSIS — I4891 Unspecified atrial fibrillation: Secondary | ICD-10-CM

## 2015-03-18 LAB — POCT INR: INR: 3.4

## 2015-04-04 DIAGNOSIS — J449 Chronic obstructive pulmonary disease, unspecified: Secondary | ICD-10-CM | POA: Diagnosis not present

## 2015-04-09 ENCOUNTER — Ambulatory Visit (INDEPENDENT_AMBULATORY_CARE_PROVIDER_SITE_OTHER): Payer: Medicare Other | Admitting: Cardiology

## 2015-04-09 ENCOUNTER — Encounter: Payer: Self-pay | Admitting: Cardiology

## 2015-04-09 VITALS — BP 98/62 | HR 88 | Ht 60.0 in | Wt 158.0 lb

## 2015-04-09 DIAGNOSIS — I481 Persistent atrial fibrillation: Secondary | ICD-10-CM

## 2015-04-09 DIAGNOSIS — I4819 Other persistent atrial fibrillation: Secondary | ICD-10-CM

## 2015-04-09 NOTE — Progress Notes (Signed)
HPI The patient returns for followup of atrial fibrillation and coronary disease.  Since I last saw her she has had no further cardiovascular symptoms. She has had no chest pain she's had previously. She does not notice her atrial fibrillation. She denies any presyncope or syncope. She has no chest pressure, neck or arm discomfort. She's had no weight gain or edema.  Her biggest issue continues to be dyspnea. She wears oxygen when necessary at night she gets very short of breath trying to do anything like going to the grocery store. She is not describing any reproducible chest pressure, neck or arm discomfort. She did have one episode of sharp pain a few nights ago for which she took a nitroglycerin but otherwise hasn't had to use this. She's not describing PND or orthopnea. She's not noticing her fibrillation. She does unfortunately continue to smoke.   No Known Allergies  Current Outpatient Prescriptions  Medication Sig Dispense Refill  . alendronate (FOSAMAX) 70 MG tablet Take 70 mg by mouth once a week. Take with a full glass of water on an empty stomach.    Marland Kitchen. atorvastatin (LIPITOR) 40 MG tablet TAKE 1 TABLET (40 MG TOTAL) BY MOUTH DAILY. 90 tablet 1  . busPIRone (BUSPAR) 15 MG tablet TAKE 1 TABLET (15 MG TOTAL) BY MOUTH EVERY 8 (EIGHT) HOURS AS NEEDED. 90 tablet 1  . Cholecalciferol (VITAMIN D) 2000 UNITS tablet Take 2,000 Units by mouth daily.      Marland Kitchen. dexlansoprazole (DEXILANT) 60 MG capsule TAKE 1 CAPSULE (60 MG TOTAL) BY MOUTH DAILY. 90 capsule 1  . diltiazem (CARDIZEM CD) 180 MG 24 hr capsule TAKE 1 CAPSULE (180 MG TOTAL) BY MOUTH DAILY. 90 capsule 0  . Fluticasone-Salmeterol (ADVAIR DISKUS) 250-50 MCG/DOSE AEPB Inhale 1 puff into the lungs 2 (two) times daily. 60 each 5  . HYDROcodone-acetaminophen (NORCO/VICODIN) 5-325 MG per tablet Take 1 tablet by mouth 2 (two) times daily as needed for moderate pain. 60 tablet 0  . metoprolol (LOPRESSOR) 50 MG tablet Take 1 tablet (50 mg total) by  mouth 2 (two) times daily. 180 tablet 1  . nitroGLYCERIN (NITROSTAT) 0.4 MG SL tablet Place 1 tablet (0.4 mg total) under the tongue every 5 (five) minutes as needed for chest pain. 25 tablet 6  . potassium chloride SA (KLOR-CON M20) 20 MEQ tablet Take 1 tablet (20 mEq total) by mouth daily. 90 tablet 1  . tiotropium (SPIRIVA HANDIHALER) 18 MCG inhalation capsule PLACE 1 CAPSULE (18 MCG TOTAL) INTO INHALER AND INHALE DAILY. 90 capsule 1  . torsemide (DEMADEX) 20 MG tablet 2 po BID (Patient taking differently: Take 20 mg by mouth 2 (two) times daily. ) 120 tablet 3  . warfarin (COUMADIN) 5 MG tablet TAKE 1 TABLET (5 MG TOTAL) BY MOUTH DAILY. 90 tablet 1  . [DISCONTINUED] diltiazem (TIAZAC) 180 MG 24 hr capsule Take 1 capsule (180 mg total) by mouth daily. 90 capsule 1   No current facility-administered medications for this visit.    Past Medical History  Diagnosis Date  . Hypertension   . CAD (coronary artery disease) 5/96  . Edema   . Hyperlipidemia   . Chronic atrial fibrillation     Coumadin therapy   . Osteopenia   . Renal insufficiency   . Closed right ankle fracture   . CHF (congestive heart failure)   . LBP (low back pain)   . Arthritis   . Cataract   . COPD (chronic obstructive pulmonary disease)  wears O2 at home as needed  . Esophageal stricture   . Esophageal dysmotility 09/2011    seen on barium esophagram.    Past Surgical History  Procedure Laterality Date  . Heart stent      X2  . Mole removed  2013    umbilicus  . Esophagogastroduodenoscopy  2012, 05/2013,09/2013    dilation of esophagus and stricture in 09/2011, 05/2013.  meat disimpaction 09/2013  . Esophagogastroduodenoscopy N/A 10/18/2013    Procedure: ESOPHAGOGASTRODUODENOSCOPY (EGD);  Surgeon: Meryl Dare, MD;  Location: [facility] ENDOSCOPY;  Service: Endoscopy;  Laterality: N/A;  possible food impaction      ROS:  As stated in the HPI and negative for all other systems.   PHYSICAL EXAM BP 98/62  mmHg  Pulse 88  Ht 5' (1.524 m)  Wt 158 lb (71.668 kg)  BMI 30.86 kg/m2  LMP 03/27/1983 GENERAL:  Well appearing HEENT:  Pupils equal round and reactive, fundi not visualized, oral mucosa unremarkable NECK:  No jugular venous distention, waveform within normal limits, carotid upstroke brisk and symmetric, no bruits, no thyromegaly LYMPHATICS:  No cervical, inguinal adenopathy LUNGS:  Clear to auscultation bilaterally BACK:  No CVA tenderness, lordosis CHEST:  Unremarkable HEART:  PMI not displaced or sustained,S1 and S2 within normal limits, no S3, no clicks, no rubs, no murmurs, irregular ABD:  Flat, positive bowel sounds normal in frequency in pitch, no bruits, no rebound, no guarding, no midline pulsatile mass, no hepatomegaly, no splenomegaly EXT:  2 plus pulses throughout, no edema, no cyanosis no clubbing SKIN:  No rashes no nodules NEURO:  Cranial nerves II through XII grossly intact, motor grossly intact throughout [facility]:  Cognitively intact, oriented to person place and time  EKG:  03/04/14, atrial fib rate 88, axis within normal limits, intervals within normal limits, no acute ST-T wave changes.  04/09/2015  ASSESSMENT AND PLAN  ATRIAL FIB:  The patient  tolerates this rhythm and rate control and anticoagulation. We will continue with the meds as listed.  [name] has a CHA2DS2 - VASc score of 5 with a risk of stroke of 6.7%   CAD:  She has no ongoing chest pain.  No further testing is indicated.    TOBACCO:  We talked about this again today but she says since his almost daily she probably won't quit.   DYSLIPIDEMIA:  I will defer to Katie Pierini, [name]  HTN:  The blood pressure is at target. No change in medications is indicated. We will continue with therapeutic lifestyle changes (TLC).  DYSPNEA:  Walking around the office she became dyspneic with sats dropping from the low 90s to 86. I sent a note to Katie Pierini, [name] to see if she would consider  prescribing oxygen that she could take with her when she is out ambulating.

## 2015-04-09 NOTE — Patient Instructions (Signed)
The current medical regimen is effective;  continue present plan and medications.  Follow up in 1 year with Dr. Hochrein in [place].  You will receive a letter in the mail 2 months before you are due.  Please call us when you receive this letter to schedule your follow up appointment.  Thank you for choosing [facility]!!     

## 2015-04-15 ENCOUNTER — Other Ambulatory Visit: Payer: Self-pay

## 2015-04-15 ENCOUNTER — Ambulatory Visit (INDEPENDENT_AMBULATORY_CARE_PROVIDER_SITE_OTHER): Payer: Medicare Other | Admitting: Pharmacist Clinician (PhC)/ Clinical Pharmacy Specialist

## 2015-04-15 DIAGNOSIS — I4891 Unspecified atrial fibrillation: Secondary | ICD-10-CM | POA: Diagnosis not present

## 2015-04-15 DIAGNOSIS — I509 Heart failure, unspecified: Secondary | ICD-10-CM

## 2015-04-15 LAB — POCT INR: INR: 2.1

## 2015-04-15 MED ORDER — TORSEMIDE 20 MG PO TABS: ORAL_TABLET | ORAL | Status: DC

## 2015-04-18 ENCOUNTER — Ambulatory Visit (INDEPENDENT_AMBULATORY_CARE_PROVIDER_SITE_OTHER): Payer: Medicare Other | Admitting: Nurse Practitioner

## 2015-04-18 ENCOUNTER — Encounter: Payer: Self-pay | Admitting: Nurse Practitioner

## 2015-04-18 VITALS — BP 122/72 | HR 84 | Temp 97.2°F | Ht 60.0 in | Wt 160.6 lb

## 2015-04-18 DIAGNOSIS — M545 Low back pain, unspecified: Secondary | ICD-10-CM

## 2015-04-18 DIAGNOSIS — I10 Essential (primary) hypertension: Secondary | ICD-10-CM

## 2015-04-18 DIAGNOSIS — Z23 Encounter for immunization: Secondary | ICD-10-CM

## 2015-04-18 DIAGNOSIS — G8929 Other chronic pain: Secondary | ICD-10-CM

## 2015-04-18 DIAGNOSIS — I25709 Atherosclerosis of coronary artery bypass graft(s), unspecified, with unspecified angina pectoris: Secondary | ICD-10-CM | POA: Diagnosis not present

## 2015-04-18 DIAGNOSIS — I4819 Other persistent atrial fibrillation: Secondary | ICD-10-CM

## 2015-04-18 DIAGNOSIS — F411 Generalized anxiety disorder: Secondary | ICD-10-CM

## 2015-04-18 DIAGNOSIS — I481 Persistent atrial fibrillation: Secondary | ICD-10-CM | POA: Diagnosis not present

## 2015-04-18 DIAGNOSIS — E785 Hyperlipidemia, unspecified: Secondary | ICD-10-CM

## 2015-04-18 DIAGNOSIS — K219 Gastro-esophageal reflux disease without esophagitis: Secondary | ICD-10-CM

## 2015-04-18 DIAGNOSIS — E039 Hypothyroidism, unspecified: Secondary | ICD-10-CM

## 2015-04-18 DIAGNOSIS — J449 Chronic obstructive pulmonary disease, unspecified: Secondary | ICD-10-CM | POA: Diagnosis not present

## 2015-04-18 DIAGNOSIS — E876 Hypokalemia: Secondary | ICD-10-CM | POA: Diagnosis not present

## 2015-04-18 DIAGNOSIS — M810 Age-related osteoporosis without current pathological fracture: Secondary | ICD-10-CM | POA: Diagnosis not present

## 2015-04-18 MED ORDER — METOPROLOL TARTRATE 50 MG PO TABS: 50.0000 mg | ORAL_TABLET | Freq: Two times a day (BID) | ORAL | Status: DC

## 2015-04-18 MED ORDER — DEXLANSOPRAZOLE 60 MG PO CPDR: DELAYED_RELEASE_CAPSULE | ORAL | Status: DC

## 2015-04-18 MED ORDER — BUSPIRONE HCL 15 MG PO TABS: ORAL_TABLET | ORAL | Status: DC

## 2015-04-18 MED ORDER — POTASSIUM CHLORIDE CRYS ER 20 MEQ PO TBCR: 20.0000 meq | EXTENDED_RELEASE_TABLET | Freq: Every day | ORAL | Status: DC

## 2015-04-18 MED ORDER — DILTIAZEM HCL ER COATED BEADS 180 MG PO CP24: ORAL_CAPSULE | ORAL | Status: DC

## 2015-04-18 MED ORDER — ATORVASTATIN CALCIUM 40 MG PO TABS: ORAL_TABLET | ORAL | Status: DC

## 2015-04-18 MED ORDER — HYDROCODONE-ACETAMINOPHEN 5-325 MG PO TABS: 1.0000 | ORAL_TABLET | Freq: Two times a day (BID) | ORAL | Status: DC | PRN

## 2015-04-18 MED ORDER — ALENDRONATE SODIUM 70 MG PO TABS: 70.0000 mg | ORAL_TABLET | ORAL | Status: DC

## 2015-04-18 NOTE — Patient Instructions (Signed)

## 2015-04-18 NOTE — Progress Notes (Signed)
Subjective:    Patient ID: [name], female    DOB: 08-Jan-1933, 81 y.o.   MRN: [id number]  Patient here today for follow up of chronic medical problems. States there are no changes since last visit and has no complaints today.  *She saw Dr. Warren Melendez 2 weeks ago- he sent message that her sao2 was low and that she needs portable O2 to take with her when she goes out. She has O2 at home that she uses on prn basis  Hyperlipidemia This is a chronic problem. The problem is controlled. Recent lipid tests were reviewed and are normal. She has no history of diabetes, hypothyroidism or obesity. Pertinent negatives include no chest pain or shortness of breath. Current antihyperlipidemic treatment includes statins. The current treatment provides moderate improvement of lipids. Compliance problems include adherence to exercise and adherence to diet.  Risk factors for coronary artery disease include dyslipidemia, hypertension and post-menopausal.  Hypertension This is a chronic problem. The current episode started more than 1 year ago. The problem is unchanged. The problem is controlled. Pertinent negatives include no chest pain, headaches, palpitations or shortness of breath. Risk factors for coronary artery disease include dyslipidemia, obesity and post-menopausal state. Past treatments include beta blockers and angiotensin blockers. The current treatment provides moderate improvement. Compliance problems include diet and exercise.   Atrial Fibrillation Presents for follow-up visit. Symptoms include hypertension. Symptoms are negative for chest pain, palpitations and shortness of breath. The symptoms have been stable. Past medical history includes atrial fibrillation and hyperlipidemia.  Gastrophageal Reflux She reports no chest pain, no coughing or no nausea. This is a chronic problem. The problem occurs occasionally. The problem has been unchanged. Nothing aggravates the symptoms. Risk factors include  obesity and lack of exercise. She has tried a histamine-2 antagonist for the symptoms. The treatment provided moderate relief.  COPD Well controlled on spirivia and advair. Has not needed albuterol rescue inhaler. Hypokalemia K-dur daily no c/o lower extremity cramping Atrial Fib Well controlled on diltiazem- also on coumadin without any bleeding. Has appt with cardiologist next week.   Chronic back  pain She got  A depoprovera shot at last visit which really helped her back pain- would like another one.   Review of Systems  Respiratory: Negative for cough and shortness of breath.   Cardiovascular: Negative for chest pain and palpitations.  Gastrointestinal: Negative for nausea.  Neurological: Negative.  Negative for headaches.  All other systems reviewed and are negative.      Objective:   Physical Exam  Constitutional: She is oriented to person, place, and time. She appears well-developed and well-nourished.  HENT:  Nose: Nose normal.  Mouth/Throat: Oropharynx is clear and moist.  Cerumen bil ears.   Eyes: EOM are normal.  Neck: Trachea normal, normal range of motion and full passive range of motion without pain. Neck supple. No JVD present. Carotid bruit is not present. No thyromegaly present.  Cardiovascular: Normal rate, regular rhythm, normal heart sounds and intact distal pulses.  Exam reveals no gallop and no friction rub.   No murmur heard. Pulmonary/Chest: Effort normal and breath sounds normal.  Abdominal: Soft. Bowel sounds are normal. She exhibits no distension and no mass. There is no tenderness.  Musculoskeletal: Normal range of motion.  Lymphadenopathy:    She has no cervical adenopathy.  Neurological: She is alert and oriented to person, place, and time. She has normal reflexes.  Skin: Skin is warm and dry.  Psychiatric: She has a normal mood and  affect. Her behavior is normal. Judgment and thought content normal.   BP 122/72 mmHg  Pulse 84  Temp(Src) 97.2  F (36.2 C) (Oral)  Ht 5' (1.524 m)  Wt 160 lb 9.6 oz (72.848 kg)  BMI 31.37 kg/m2  SpO2 74%  LMP 03/27/1983        Assessment & Plan:   1. Essential hypertension, benign Do not add salt to diet - metoprolol (LOPRESSOR) 50 MG tablet; Take 1 tablet (50 mg total) by mouth 2 (two) times daily.  Dispense: 180 tablet; Refill: 1 - CMP14+EGFR  2. Coronary artery disease involving coronary bypass graft with unspecified angina pectoris - diltiazem (CARDIZEM CD) 180 MG 24 hr capsule; TAKE 1 CAPSULE (180 MG TOTAL) BY MOUTH DAILY.  Dispense: 90 capsule; Refill: 1  3. Persistent atrial fibrillation  4. COPD (chronic obstructive pulmonary disease) with chronic bronchitis STOP SMOKING Needs portable O2 to take with her when she goes out  5. Gastroesophageal reflux disease without esophagitis Avoid spicy foods Do not eat 2 hours prior to bedtime - dexlansoprazole (DEXILANT) 60 MG capsule; TAKE 1 CAPSULE (60 MG TOTAL) BY MOUTH DAILY.  Dispense: 90 capsule; Refill: 1 - Thyroid Panel With TSH  6. Hypothyroidism, unspecified hypothyroidism type  7. Hypokalemia - potassium chloride SA (KLOR-CON M20) 20 MEQ tablet; Take 1 tablet (20 mEq total) by mouth daily.  Dispense: 90 tablet; Refill: 1  8. Hyperlipidemia with target LDL less than 100 Low fat diet - atorvastatin (LIPITOR) 40 MG tablet; TAKE 1 TABLET (40 MG TOTAL) BY MOUTH DAILY.  Dispense: 90 tablet; Refill: 1 - NMR, lipoprofile  9. GAD (generalized anxiety disorder) Stress management - busPIRone (BUSPAR) 15 MG tablet; TAKE 1 TABLET (15 MG TOTAL) BY MOUTH EVERY 8 (EIGHT) HOURS AS NEEDED.  Dispense: 90 tablet; Refill: 1  10. Chronic low back pain Rest  No heavy lifting - HYDROcodone-acetaminophen (NORCO/VICODIN) 5-325 MG per tablet; Take 1 tablet by mouth 2 (two) times daily as needed for moderate pain.  Dispense: 60 tablet; Refill: 0  12. Osteoporosis Weight bearing - alendronate (FOSAMAX) 70 MG tablet; Take 1 tablet (70 mg  total) by mouth once a week. Take with a full glass of water on an empty stomach.  Dispense: 12 tablet; Refill: 1    Labs pending Health maintenance reviewed Diet and exercise encouraged Continue all meds Follow up  In 3 month   [place], [name]

## 2015-04-22 ENCOUNTER — Other Ambulatory Visit: Payer: Self-pay | Admitting: Nurse Practitioner

## 2015-04-22 DIAGNOSIS — I70203 Unspecified atherosclerosis of native arteries of extremities, bilateral legs: Secondary | ICD-10-CM | POA: Diagnosis not present

## 2015-04-22 DIAGNOSIS — J449 Chronic obstructive pulmonary disease, unspecified: Secondary | ICD-10-CM

## 2015-04-22 DIAGNOSIS — B351 Tinea unguium: Secondary | ICD-10-CM | POA: Diagnosis not present

## 2015-04-22 DIAGNOSIS — L84 Corns and callosities: Secondary | ICD-10-CM | POA: Diagnosis not present

## 2015-05-04 DIAGNOSIS — J449 Chronic obstructive pulmonary disease, unspecified: Secondary | ICD-10-CM | POA: Diagnosis not present

## 2015-05-07 DIAGNOSIS — H538 Other visual disturbances: Secondary | ICD-10-CM | POA: Diagnosis not present

## 2015-06-03 ENCOUNTER — Ambulatory Visit (INDEPENDENT_AMBULATORY_CARE_PROVIDER_SITE_OTHER): Payer: Medicare Other | Admitting: Pharmacist Clinician (PhC)/ Clinical Pharmacy Specialist

## 2015-06-03 ENCOUNTER — Telehealth: Payer: Self-pay | Admitting: Nurse Practitioner

## 2015-06-03 DIAGNOSIS — I482 Chronic atrial fibrillation, unspecified: Secondary | ICD-10-CM

## 2015-06-03 LAB — POCT INR: INR: 3

## 2015-06-03 NOTE — Patient Instructions (Signed)
Anticoagulation Dose Instructions as of 06/03/2015      Katie SmilesSun Mon Tue Wed Thu Fri Sat   New Dose 2.5 mg 2.5 mg 2.5 mg 2.5 mg 2.5 mg 2.5 mg 2.5 mg    Description        Change to 2.5mg  every day

## 2015-06-04 DIAGNOSIS — J449 Chronic obstructive pulmonary disease, unspecified: Secondary | ICD-10-CM | POA: Diagnosis not present

## 2015-06-05 NOTE — Telephone Encounter (Signed)
Pt is going to the [employer] to see what they want

## 2015-06-16 ENCOUNTER — Ambulatory Visit (INDEPENDENT_AMBULATORY_CARE_PROVIDER_SITE_OTHER): Payer: Medicare Other | Admitting: Nurse Practitioner

## 2015-06-16 ENCOUNTER — Encounter: Payer: Self-pay | Admitting: Nurse Practitioner

## 2015-06-16 VITALS — BP 142/87 | HR 90 | Temp 97.2°F | Ht 60.0 in | Wt 160.0 lb

## 2015-06-16 DIAGNOSIS — K219 Gastro-esophageal reflux disease without esophagitis: Secondary | ICD-10-CM | POA: Diagnosis not present

## 2015-06-16 DIAGNOSIS — I482 Chronic atrial fibrillation, unspecified: Secondary | ICD-10-CM

## 2015-06-16 DIAGNOSIS — I25709 Atherosclerosis of coronary artery bypass graft(s), unspecified, with unspecified angina pectoris: Secondary | ICD-10-CM

## 2015-06-16 DIAGNOSIS — I481 Persistent atrial fibrillation: Secondary | ICD-10-CM

## 2015-06-16 DIAGNOSIS — I10 Essential (primary) hypertension: Secondary | ICD-10-CM | POA: Diagnosis not present

## 2015-06-16 DIAGNOSIS — G8929 Other chronic pain: Secondary | ICD-10-CM

## 2015-06-16 DIAGNOSIS — J411 Mucopurulent chronic bronchitis: Secondary | ICD-10-CM | POA: Diagnosis not present

## 2015-06-16 DIAGNOSIS — E039 Hypothyroidism, unspecified: Secondary | ICD-10-CM | POA: Diagnosis not present

## 2015-06-16 DIAGNOSIS — E876 Hypokalemia: Secondary | ICD-10-CM

## 2015-06-16 DIAGNOSIS — I4819 Other persistent atrial fibrillation: Secondary | ICD-10-CM

## 2015-06-16 DIAGNOSIS — M545 Low back pain, unspecified: Secondary | ICD-10-CM

## 2015-06-16 DIAGNOSIS — E785 Hyperlipidemia, unspecified: Secondary | ICD-10-CM | POA: Diagnosis not present

## 2015-06-16 DIAGNOSIS — J4489 Other specified chronic obstructive pulmonary disease: Secondary | ICD-10-CM

## 2015-06-16 DIAGNOSIS — M810 Age-related osteoporosis without current pathological fracture: Secondary | ICD-10-CM | POA: Diagnosis not present

## 2015-06-16 DIAGNOSIS — J449 Chronic obstructive pulmonary disease, unspecified: Secondary | ICD-10-CM | POA: Diagnosis not present

## 2015-06-16 DIAGNOSIS — F411 Generalized anxiety disorder: Secondary | ICD-10-CM | POA: Diagnosis not present

## 2015-06-16 DIAGNOSIS — I509 Heart failure, unspecified: Secondary | ICD-10-CM

## 2015-06-16 MED ORDER — HYDROCODONE-ACETAMINOPHEN 5-325 MG PO TABS: 1.0000 | ORAL_TABLET | Freq: Two times a day (BID) | ORAL | Status: DC | PRN

## 2015-06-16 MED ORDER — WARFARIN SODIUM 5 MG PO TABS: ORAL_TABLET | ORAL | Status: DC

## 2015-06-16 MED ORDER — FLUTICASONE-SALMETEROL 250-50 MCG/DOSE IN AEPB: 1.0000 | INHALATION_SPRAY | Freq: Two times a day (BID) | RESPIRATORY_TRACT | Status: AC

## 2015-06-16 MED ORDER — TORSEMIDE 20 MG PO TABS: ORAL_TABLET | ORAL | Status: DC

## 2015-06-16 MED ORDER — TIOTROPIUM BROMIDE MONOHYDRATE 18 MCG IN CAPS: ORAL_CAPSULE | RESPIRATORY_TRACT | Status: DC

## 2015-06-16 NOTE — Progress Notes (Signed)
Subjective:    Patient ID: [name], female    DOB: 1933/01/15, 81 y.o.   MRN: [id number]  Patient here today for follow up of chronic medical problems. States there are no changes since last visit and has no complaints today.   Hyperlipidemia This is a chronic problem. The problem is controlled. Recent lipid tests were reviewed and are normal. She has no history of diabetes, hypothyroidism or obesity. Current antihyperlipidemic treatment includes statins. The current treatment provides moderate improvement of lipids. Compliance problems include adherence to exercise and adherence to diet.  Risk factors for coronary artery disease include dyslipidemia, hypertension and post-menopausal.  Hypertension This is a chronic problem. The current episode started more than 1 year ago. The problem is unchanged. The problem is controlled. Pertinent negatives include no headaches. Risk factors for coronary artery disease include dyslipidemia, obesity and post-menopausal state. Past treatments include beta blockers and angiotensin blockers. The current treatment provides moderate improvement. Compliance problems include diet and exercise.   Gastrophageal Reflux She reports no coughing or no nausea. This is a chronic problem. The problem occurs occasionally. The problem has been unchanged. Nothing aggravates the symptoms. Risk factors include obesity and lack of exercise. She has tried a histamine-2 antagonist for the symptoms. The treatment provided moderate relief.  COPD Well controlled on spirivia and advair. Has not needed albuterol rescue inhaler. Hypokalemia K-dur daily no c/o lower extremity cramping Atrial Fib Well controlled on diltiazem- also on coumadin without any bleeding. Has appt with cardiologist next week.   Chronic back  pain She got  A depoprovera shot at last visit which really helped her back pain- would like another one.   Review of Systems  Constitutional: Negative.   HENT:  Negative.   Respiratory: Negative for cough.   Gastrointestinal: Negative for nausea.  Genitourinary: Negative.   Musculoskeletal: Negative.   Neurological: Negative.  Negative for headaches.  Psychiatric/Behavioral: Negative.   All other systems reviewed and are negative.      Objective:   Physical Exam  Constitutional: She is oriented to person, place, and time. She appears well-developed and well-nourished.  HENT:  Nose: Nose normal.  Mouth/Throat: Oropharynx is clear and moist.  Cerumen bil ears.   Eyes: EOM are normal.  Neck: Trachea normal, normal range of motion and full passive range of motion without pain. Neck supple. No JVD present. Carotid bruit is not present. No thyromegaly present.  Cardiovascular: Normal rate, regular rhythm, normal heart sounds and intact distal pulses.  Exam reveals no gallop and no friction rub.   No murmur heard. Pulmonary/Chest: Effort normal and breath sounds normal.  Abdominal: Soft. Bowel sounds are normal. She exhibits no distension and no mass. There is no tenderness.  Musculoskeletal: Normal range of motion.  Foot cyanosis- mild- slow cap refill- palpable pedal pulse bil- warm to touch.  Lymphadenopathy:    She has no cervical adenopathy.  Neurological: She is alert and oriented to person, place, and time. She has normal reflexes.  Skin: Skin is warm and dry.  Psychiatric: She has a normal mood and affect. Her behavior is normal. Judgment and thought content normal.   BP 142/87 mmHg  Pulse 90  Temp(Src) 97.2 F (36.2 C) (Oral)  Ht 5' (1.524 m)  Wt 160 lb (72.576 kg)  BMI 31.25 kg/m2  LMP 03/27/1983        Assessment & Plan:   1. Essential hypertension, benign Do not add salt to deit - CMP14+EGFR  2. Coronary artery disease  involving coronary bypass graft with unspecified angina pectoris Keep follow up with cardiologist  3. Persistent atrial fibrillation  4. COPD (chronic obstructive pulmonary disease) with chronic  bronchitis  5. Gastroesophageal reflux disease without esophagitis Avoid spicy foods Do not eat 2 hours prior to bedtime   6. Hypothyroidism, unspecified hypothyroidism type - Thyroid Panel With TSH  7. Osteoporosis Weight bearing exercises  8. Hypokalemia   9. Hyperlipidemia with target LDL less than 100 Low fat diet - Lipid panel  10. GAD (generalized anxiety disorder) Stress management  11. Chronic low back pain - Fluticasone-Salmeterol (ADVAIR DISKUS) 250-50 MCG/DOSE AEPB; Inhale 1 puff into the lungs 2 (two) times daily.  Dispense: 60 each; Refill: 5 - HYDROcodone-acetaminophen (NORCO/VICODIN) 5-325 MG per tablet; Take 1 tablet by mouth 2 (two) times daily as needed for moderate pain.  Dispense: 60 tablet; Refill: 0  12. Mucopurulent chronic bronchitis - tiotropium (SPIRIVA HANDIHALER) 18 MCG inhalation capsule; PLACE 1 CAPSULE (18 MCG TOTAL) INTO INHALER AND INHALE DAILY.  Dispense: 90 capsule; Refill: 1  13. Chronic atrial fibrillation - warfarin (COUMADIN) 5 MG tablet; TAKE 1 TABLET (5 MG TOTAL) BY MOUTH DAILY.  Dispense: 90 tablet; Refill: 1  14. Chronic congestive heart failure, unspecified congestive heart failure type - torsemide (DEMADEX) 20 MG tablet; 2 po BID  Dispense: 360 tablet; Refill: 0    Labs pending Health maintenance reviewed Diet and exercise encouraged Continue all meds Follow up  In 3 months   [place], [name]

## 2015-06-16 NOTE — Patient Instructions (Signed)
Stress and Stress Management Stress is a normal reaction to life events. It is what you feel when life demands more than you are used to or more than you can handle. Some stress can be useful. For example, the stress reaction can help you catch the last bus of the day, study for a test, or meet a deadline at work. But stress that occurs too often or for too long can cause problems. It can affect your emotional health and interfere with relationships and normal daily activities. Too much stress can weaken your immune system and increase your risk for physical illness. If you already have a medical problem, stress can make it worse. CAUSES  All sorts of life events may cause stress. An event that causes stress for one person may not be stressful for another person. Major life events commonly cause stress. These may be positive or negative. Examples include losing your job, moving into a new home, getting married, having a baby, or losing a loved one. Less obvious life events may also cause stress, especially if they occur day after day or in combination. Examples include working long hours, driving in traffic, caring for children, being in debt, or being in a difficult relationship. SIGNS AND SYMPTOMS Stress may cause emotional symptoms including, the following:  Anxiety. This is feeling worried, afraid, on edge, overwhelmed, or out of control.  Anger. This is feeling irritated or impatient.  Depression. This is feeling sad, down, helpless, or guilty.  Difficulty focusing, remembering, or making decisions. Stress may cause physical symptoms, including the following:   Aches and pains. These may affect your head, neck, back, stomach, or other areas of your body.  Tight muscles or clenched jaw.  Low energy or trouble sleeping. Stress may cause unhealthy behaviors, including the following:   Eating to feel better (overeating) or skipping meals.  Sleeping too little, too much, or both.  Working  too much or putting off tasks (procrastination).  Smoking, drinking alcohol, or using drugs to feel better. DIAGNOSIS  Stress is diagnosed through an assessment by your health care provider. Your health care provider will ask questions about your symptoms and any stressful life events.Your health care provider will also ask about your medical history and may order blood tests or other tests. Certain medical conditions and medicine can cause physical symptoms similar to stress. Mental illness can cause emotional symptoms and unhealthy behaviors similar to stress. Your health care provider may refer you to a mental health professional for further evaluation.  TREATMENT  Stress management is the recommended treatment for stress.The goals of stress management are reducing stressful life events and coping with stress in healthy ways.  Techniques for reducing stressful life events include the following:  Stress identification. Self-monitor for stress and identify what causes stress for you. These skills may help you to avoid some stressful events.  Time management. Set your priorities, keep a calendar of events, and learn to say "no." These tools can help you avoid making too many commitments. Techniques for coping with stress include the following:  Rethinking the problem. Try to think realistically about stressful events rather than ignoring them or overreacting. Try to find the positives in a stressful situation rather than focusing on the negatives.  Exercise. Physical exercise can release both physical and emotional tension. The key is to find a form of exercise you enjoy and do it regularly.  Relaxation techniques. These relax the body and mind. Examples include yoga, meditation, tai chi, biofeedback, deep  breathing, progressive muscle relaxation, listening to music, being out in nature, journaling, and other hobbies. Again, the key is to find one or more that you enjoy and can do  regularly.  Healthy lifestyle. Eat a balanced diet, get plenty of sleep, and do not smoke. Avoid using alcohol or drugs to relax.  Strong support network. Spend time with family, friends, or other people you enjoy being around.Express your feelings and talk things over with someone you trust. Counseling or talktherapy with a mental health professional may be helpful if you are having difficulty managing stress on your own. Medicine is typically not recommended for the treatment of stress.Talk to your health care provider if you think you need medicine for symptoms of stress. HOME CARE INSTRUCTIONS  Keep all follow-up visits as directed by your health care provider.  Take all medicines as directed by your health care provider. SEEK MEDICAL CARE IF:  Your symptoms get worse or you start having new symptoms.  You feel overwhelmed by your problems and can no longer manage them on your own. SEEK IMMEDIATE MEDICAL CARE IF:  You feel like hurting yourself or someone else. Document Released: 06/08/2001 Document Revised: 04/29/2014 Document Reviewed: 08/07/2013 [employer] Patient Information 2015 [employer]. This information is not intended to replace advice given to you by your health care provider. Make sure you discuss any questions you have with your health care provider.  

## 2015-06-17 LAB — CMP14+EGFR
ALT: 6 IU/L (ref 0–32)
AST: 14 IU/L (ref 0–40)
Albumin/Globulin Ratio: 1.8 (ref 1.1–2.5)
Albumin: 3.8 g/dL (ref 3.5–4.8)
Alkaline Phosphatase: 51 IU/L (ref 39–117)
BUN/Creatinine Ratio: 16 (ref 11–26)
BUN: 20 mg/dL (ref 8–27)
Bilirubin Total: 0.4 mg/dL (ref 0.0–1.2)
CO2: 30 mmol/L — ABNORMAL HIGH (ref 18–29)
Calcium: 9.6 mg/dL (ref 8.7–10.3)
Chloride: 98 mmol/L (ref 97–108)
Creatinine, Ser: 1.29 mg/dL — ABNORMAL HIGH (ref 0.57–1.00)
GFR calc Af Amer: 46 mL/min/{1.73_m2} — ABNORMAL LOW (ref >59)
GFR calc non Af Amer: 39 mL/min/{1.73_m2} — ABNORMAL LOW (ref >59)
Globulin, Total: 2.1 g/dL (ref 1.5–4.5)
Glucose: 84 mg/dL (ref 65–99)
Potassium: 4.5 mmol/L (ref 3.5–5.2)
Sodium: 143 mmol/L (ref 134–144)
Total Protein: 5.9 g/dL — ABNORMAL LOW (ref 6.0–8.5)

## 2015-06-17 LAB — LIPID PANEL
Chol/HDL Ratio: 2.8 ratio units (ref 0.0–4.4)
Cholesterol, Total: 135 mg/dL (ref 100–199)
HDL: 49 mg/dL (ref >39)
LDL Calculated: 60 mg/dL (ref 0–99)
Triglycerides: 128 mg/dL (ref 0–149)
VLDL Cholesterol Cal: 26 mg/dL (ref 5–40)

## 2015-06-17 LAB — THYROID PANEL WITH TSH
Free Thyroxine Index: 2.6 (ref 1.2–4.9)
T3 Uptake Ratio: 31 % (ref 24–39)
T4, Total: 8.5 ug/dL (ref 4.5–12.0)
TSH: 0.713 u[IU]/mL (ref 0.450–4.500)

## 2015-07-04 ENCOUNTER — Encounter: Payer: Self-pay | Admitting: Family

## 2015-07-04 DIAGNOSIS — J449 Chronic obstructive pulmonary disease, unspecified: Secondary | ICD-10-CM | POA: Diagnosis not present

## 2015-07-08 ENCOUNTER — Ambulatory Visit (INDEPENDENT_AMBULATORY_CARE_PROVIDER_SITE_OTHER): Payer: Medicare Other | Admitting: Family

## 2015-07-08 ENCOUNTER — Other Ambulatory Visit: Payer: Self-pay | Admitting: Vascular Surgery

## 2015-07-08 ENCOUNTER — Encounter (HOSPITAL_COMMUNITY): Payer: Medicare Other

## 2015-07-08 ENCOUNTER — Encounter: Payer: Self-pay | Admitting: Family

## 2015-07-08 ENCOUNTER — Ambulatory Visit: Payer: Medicare Other | Admitting: Family

## 2015-07-08 ENCOUNTER — Ambulatory Visit (HOSPITAL_COMMUNITY)
Admission: RE | Admit: 2015-07-08 | Discharge: 2015-07-08 | Disposition: A | Discharge status: Home / Self Care | Payer: Medicare Other | Source: Ambulatory Visit | Attending: Family | Admitting: Family

## 2015-07-08 VITALS — BP 148/91 | HR 82 | Resp 16 | Ht 60.0 in | Wt 158.0 lb

## 2015-07-08 DIAGNOSIS — Z720 Tobacco use: Secondary | ICD-10-CM

## 2015-07-08 DIAGNOSIS — I6522 Occlusion and stenosis of left carotid artery: Secondary | ICD-10-CM | POA: Diagnosis not present

## 2015-07-08 DIAGNOSIS — I6523 Occlusion and stenosis of bilateral carotid arteries: Secondary | ICD-10-CM | POA: Diagnosis not present

## 2015-07-08 DIAGNOSIS — F17200 Nicotine dependence, unspecified, uncomplicated: Secondary | ICD-10-CM

## 2015-07-08 DIAGNOSIS — I6521 Occlusion and stenosis of right carotid artery: Secondary | ICD-10-CM

## 2015-07-08 NOTE — Progress Notes (Signed)
Established Carotid Patient   History of Present Illness  [name] is a 81 y.o. female patient of Dr. Hart Rochester who returns for continued followup regarding her carotid occlusive disease. She has known left ICA occlusion. She has no history of stroke or lateralizing weakness or aphasia or amaurosis fugax or diplopia or blurred vision or syncope. She has a known mild right ICA stenosis which remains asymptomatic. She does not take daily aspirin.  Patient has not had previous carotid artery intervention.  Pt saw her PCP June 2016 and her blood pressure was 142/87 at that visit (review of records).  The patient denies New Medical or Surgical History.  Her activity is limited by low back pain, pt states she was told this is arthritis.   Pt Diabetic: no Pt smoker: smoker  (1 ppd, started smoking at age 84 yrs)  Pt meds include: Statin : yes ASA: no Other anticoagulants/antiplatelets: coumadin, hx of atrial fib   Past Medical History  Diagnosis Date  . Hypertension   . CAD (coronary artery disease) 5/96  . Edema   . Hyperlipidemia   . Chronic atrial fibrillation     Coumadin therapy   . Osteopenia   . Renal insufficiency   . Closed right ankle fracture   . CHF (congestive heart failure)   . LBP (low back pain)   . Arthritis   . Cataract   . COPD (chronic obstructive pulmonary disease)     wears O2 at home as needed  . Esophageal stricture   . Esophageal dysmotility 09/2011    seen on barium esophagram.    Social History History  Substance Use Topics  . Smoking status: Current Every Day Smoker -- 0.50 packs/day    Types: Cigarettes  . Smokeless tobacco: Never Used  . Alcohol Use: No     Comment: previous    Family History Family History  Problem Relation Age of Onset  . Colon cancer Neg Hx   . Esophageal cancer Neg Hx   . Stomach cancer Neg Hx   . Rectal cancer Neg Hx   . Hyperlipidemia Mother   . Hypertension Mother   . Hyperlipidemia Father   . Cancer  Sister   . Hyperlipidemia Sister   . Hypertension Sister     Surgical History Past Surgical History  Procedure Laterality Date  . Heart stent      X2  . Mole removed  2013    umbilicus  . Esophagogastroduodenoscopy  2012, 05/2013,09/2013    dilation of esophagus and stricture in 09/2011, 05/2013.  meat disimpaction 09/2013  . Esophagogastroduodenoscopy N/A 10/18/2013    Procedure: ESOPHAGOGASTRODUODENOSCOPY (EGD);  Surgeon: Meryl Dare, MD;  Location: [facility] ENDOSCOPY;  Service: Endoscopy;  Laterality: N/A;  possible food impaction    No Known Allergies  Current Outpatient Prescriptions  Medication Sig Dispense Refill  . alendronate (FOSAMAX) 70 MG tablet Take 1 tablet (70 mg total) by mouth once a week. Take with a full glass of water on an empty stomach. 12 tablet 1  . atorvastatin (LIPITOR) 40 MG tablet TAKE 1 TABLET (40 MG TOTAL) BY MOUTH DAILY. 90 tablet 1  . busPIRone (BUSPAR) 15 MG tablet TAKE 1 TABLET (15 MG TOTAL) BY MOUTH EVERY 8 (EIGHT) HOURS AS NEEDED. 90 tablet 1  . Cholecalciferol (VITAMIN D) 2000 UNITS tablet Take 2,000 Units by mouth daily.      Marland Kitchen dexlansoprazole (DEXILANT) 60 MG capsule TAKE 1 CAPSULE (60 MG TOTAL) BY MOUTH DAILY. 90 capsule 1  .  diltiazem (CARDIZEM CD) 180 MG 24 hr capsule TAKE 1 CAPSULE (180 MG TOTAL) BY MOUTH DAILY. 90 capsule 1  . Fluticasone-Salmeterol (ADVAIR DISKUS) 250-50 MCG/DOSE AEPB Inhale 1 puff into the lungs 2 (two) times daily. 60 each 5  . HYDROcodone-acetaminophen (NORCO/VICODIN) 5-325 MG per tablet Take 1 tablet by mouth 2 (two) times daily as needed for moderate pain. 60 tablet 0  . metoprolol (LOPRESSOR) 50 MG tablet Take 1 tablet (50 mg total) by mouth 2 (two) times daily. 180 tablet 1  . nitroGLYCERIN (NITROSTAT) 0.4 MG SL tablet Place 1 tablet (0.4 mg total) under the tongue every 5 (five) minutes as needed for chest pain. 25 tablet 6  . potassium chloride SA (KLOR-CON M20) 20 MEQ tablet Take 1 tablet (20 mEq total) by mouth  daily. 90 tablet 1  . tiotropium (SPIRIVA HANDIHALER) 18 MCG inhalation capsule PLACE 1 CAPSULE (18 MCG TOTAL) INTO INHALER AND INHALE DAILY. 90 capsule 1  . torsemide (DEMADEX) 20 MG tablet 2 po BID 360 tablet 0  . warfarin (COUMADIN) 5 MG tablet TAKE 1 TABLET (5 MG TOTAL) BY MOUTH DAILY. 90 tablet 1  . [DISCONTINUED] diltiazem (TIAZAC) 180 MG 24 hr capsule Take 1 capsule (180 mg total) by mouth daily. 90 capsule 1   No current facility-administered medications for this visit.    Review of Systems : See HPI for pertinent positives and negatives.  Physical Examination  Filed Vitals:   07/08/15 1549 07/08/15 1552 07/08/15 1559  BP: 152/96 143/86 148/91  Pulse: 83 88 82  Resp:  16   Height:  5' (1.524 m)   Weight:  158 lb (71.668 kg)   SpO2:  90%    Body mass index is 30.86 kg/(m^2).  General: WDWN obese female in NAD GAIT: slow, using cane Eyes: PERRLA Pulmonary:  Non-labored, limited air movement in all fields, Negative  Rales, Negative rhonchi, & Negative wheezing.  Cardiac: Irregular rhythm,  Negative detected murmur.  VASCULAR EXAM Carotid Bruits Right Left   Negative Negative    Aorta is not palpable. Radial pulses are 2+ palpable and equal.                                                                                                                            LE Pulses Right Left       POPLITEAL  not palpable   not palpable       POSTERIOR TIBIAL  not palpable   not palpable        DORSALIS PEDIS      ANTERIOR TIBIAL faintly palpable  faintly palpable     Gastrointestinal: soft, nontender, BS WNL, no r/g,  negative palpable masses.  Musculoskeletal: Age appropriate muscle atrophy/wasting. M/S 4/5 throughout, extremities without ischemic changes except bilateral toes are moderately dusky in dependent position.  Neurologic: A&O X 3; Appropriate Affect, Speech is normal CN 2-12 intact, pain and light touch intact in extremities, motor exam as listed  above.  Non-Invasive Vascular Imaging CAROTID DUPLEX 07/08/2015   CEREBROVASCULAR DUPLEX EVALUATION    INDICATION: Carotid artery stenosis    PREVIOUS INTERVENTION(S): N/A    DUPLEX EXAM:     RIGHT  LEFT  Peak Systolic Velocities (cm/s) End Diastolic Velocities (cm/s) Plaque LOCATION Peak Systolic Velocities (cm/s) End Diastolic Velocities (cm/s) Plaque  41 8 HT CCA PROXIMAL 35 0 HT  51 15 HT CCA MID 28 2 HT  48 16 HT CCA DISTAL 38 0 HT  80 8 HT ECA 105 11 HT  156 60 HT ICA PROXIMAL 0 0 occluded  70 27  ICA MID 0 0 occluded  47 18  ICA DISTAL 0 0 occluded    1.95 ICA / CCA Ratio (PSV) ICA occluded  Antegrade Vertebral Flow Antegrade  140 Brachial Systolic Pressure (mmHg) 136  Triphasic Brachial Artery Waveforms Triphasic    Plaque Morphology:  HM = Homogeneous, HT = Heterogeneous, CP = Calcific Plaque, SP = Smooth Plaque, IP = Irregular Plaque  ADDITIONAL FINDINGS:     IMPRESSION: Right internal carotid artery PSV suggest a 40-59% diameter reduction, upper end of range. Left internal carotid artery presents with known occlusion.    Compared to the previous exam:  PSV demonstrate a mild decrease form 180 cm/s t0 156 cm/s since previous exam of 07/02/2014, leading to a decrease in the category of stenosis.        Assessment: [name] is a 81 y.o. female who presents for routine follow up of her carotid artery stenosis. She has known left ICA occlusion.  She has no known history of stroke or TIA. Today's carotid Duplex suggests  40-59% right ICA diameter reduction, upper end of range. Left internal carotid artery presents with known occlusion. Minimal improvement in right ICA stenosis. Fortunately she does not have DM but unfortunately she continues to smoke, seems somewhat receptive to quitting, see Plan.  Plan:  The patient was counseled re smoking cessation and given several free resources re smoking cessation.  Follow-up in 1 year with Carotid Duplex.   I  discussed in depth with the patient the nature of atherosclerosis, and emphasized the importance of maximal medical management including strict control of blood pressure, blood glucose, and lipid levels, obtaining regular exercise, and cessation of smoking.  The patient is aware that without maximal medical management the underlying atherosclerotic disease process will progress, limiting the benefit of any interventions. The patient was given information about stroke prevention and what symptoms should prompt the patient to seek immediate medical care. Thank you for allowing [place] to participate in this patient's care.  Charisse March, RN, MSN, [name]-C [facility] Office: [phone]  Clinic Physician: Hart Rochester  07/08/2015 3:49 PM

## 2015-07-08 NOTE — Patient Instructions (Addendum)
Stroke Prevention Some medical conditions and behaviors are associated with an increased chance of having a stroke. You may prevent a stroke by making healthy choices and managing medical conditions. HOW CAN I REDUCE MY RISK OF HAVING A STROKE?   Stay physically active. Get at least 30 minutes of activity on most or all days.  Do not smoke. It may also be helpful to avoid exposure to secondhand smoke.  Limit alcohol use. Moderate alcohol use is considered to be:  No more than 2 drinks per day for men.  No more than 1 drink per day for nonpregnant women.  Eat healthy foods. This involves:  Eating 5 or more servings of fruits and vegetables a day.  Making dietary changes that address high blood pressure (hypertension), high cholesterol, diabetes, or obesity.  Manage your cholesterol levels.  Making food choices that are high in fiber and low in saturated fat, trans fat, and cholesterol may control cholesterol levels.  Take any prescribed medicines to control cholesterol as directed by your health care provider.  Manage your diabetes.  Controlling your carbohydrate and sugar intake is recommended to manage diabetes.  Take any prescribed medicines to control diabetes as directed by your health care provider.  Control your hypertension.  Making food choices that are low in salt (sodium), saturated fat, trans fat, and cholesterol is recommended to manage hypertension.  Take any prescribed medicines to control hypertension as directed by your health care provider.  Maintain a healthy weight.  Reducing calorie intake and making food choices that are low in sodium, saturated fat, trans fat, and cholesterol are recommended to manage weight.  Stop drug abuse.  Avoid taking birth control pills.  Talk to your health care provider about the risks of taking birth control pills if you are over 35 years old, smoke, get migraines, or have ever had a blood clot.  Get evaluated for sleep  disorders (sleep apnea).  Talk to your health care provider about getting a sleep evaluation if you snore a lot or have excessive sleepiness.  Take medicines only as directed by your health care provider.  For some people, aspirin or blood thinners (anticoagulants) are helpful in reducing the risk of forming abnormal blood clots that can lead to stroke. If you have the irregular heart rhythm of atrial fibrillation, you should be on a blood thinner unless there is a good reason you cannot take them.  Understand all your medicine instructions.  Make sure that other conditions (such as anemia or atherosclerosis) are addressed. SEEK IMMEDIATE MEDICAL CARE IF:   You have sudden weakness or numbness of the face, arm, or leg, especially on one side of the body.  Your face or eyelid droops to one side.  You have sudden confusion.  You have trouble speaking (aphasia) or understanding.  You have sudden trouble seeing in one or both eyes.  You have sudden trouble walking.  You have dizziness.  You have a loss of balance or coordination.  You have a sudden, severe headache with no known cause.  You have new chest pain or an irregular heartbeat. Any of these symptoms may represent a serious problem that is an emergency. Do not wait to see if the symptoms will go away. Get medical help at once. Call your local emergency services (911 in [place] Do not drive yourself to the hospital. Document Released: 01/20/2005 Document Revised: 04/29/2014 Document Reviewed: 06/15/2013 ExitCare Patient Information 2015 [employer]. This information is not intended to replace advice given   to you by your health care provider. Make sure you discuss any questions you have with your health care provider.    Smoking Cessation Quitting smoking is important to your health and has many advantages. However, it is not always easy to quit since nicotine is a very addictive drug. Oftentimes, people try 3 times or  more before being able to quit. This document explains the best ways for you to prepare to quit smoking. Quitting takes hard work and a lot of effort, but you can do it. ADVANTAGES OF QUITTING SMOKING  You will live longer, feel better, and live better.  Your body will feel the impact of quitting smoking almost immediately.  Within 20 minutes, blood pressure decreases. Your pulse returns to its normal level.  After 8 hours, carbon monoxide levels in the blood return to normal. Your oxygen level increases.  After 24 hours, the chance of having a heart attack starts to decrease. Your breath, hair, and body stop smelling like smoke.  After 48 hours, damaged nerve endings begin to recover. Your sense of taste and smell improve.  After 72 hours, the body is virtually free of nicotine. Your bronchial tubes relax and breathing becomes easier.  After 2 to 12 weeks, lungs can hold more air. Exercise becomes easier and circulation improves.  The risk of having a heart attack, stroke, cancer, or lung disease is greatly reduced.  After 1 year, the risk of coronary heart disease is cut in half.  After 5 years, the risk of stroke falls to the same as a nonsmoker.  After 10 years, the risk of lung cancer is cut in half and the risk of other cancers decreases significantly.  After 15 years, the risk of coronary heart disease drops, usually to the level of a nonsmoker.  If you are pregnant, quitting smoking will improve your chances of having a healthy baby.  The people you live with, especially any children, will be healthier.  You will have extra money to spend on things other than cigarettes. QUESTIONS TO THINK ABOUT BEFORE ATTEMPTING TO QUIT You may want to talk about your answers with your health care provider.  Why do you want to quit?  If you tried to quit in the past, what helped and what did not?  What will be the most difficult situations for you after you quit? How will you plan to  handle them?  Who can help you through the tough times? Your family? Friends? A health care provider?  What pleasures do you get from smoking? What ways can you still get pleasure if you quit? Here are some questions to ask your health care provider:  How can you help me to be successful at quitting?  What medicine do you think would be best for me and how should I take it?  What should I do if I need more help?  What is smoking withdrawal like? How can I get information on withdrawal? GET READY  Set a quit date.  Change your environment by getting rid of all cigarettes, ashtrays, matches, and lighters in your home, car, or work. Do not let people smoke in your home.  Review your past attempts to quit. Think about what worked and what did not. GET SUPPORT AND ENCOURAGEMENT You have a better chance of being successful if you have help. You can get support in many ways.  Tell your family, friends, and coworkers that you are going to quit and need their support. Ask them   not to smoke around you.  Get individual, group, or telephone counseling and support. Programs are available at local hospitals and health centers. Call your local health department for information about programs in your area.  Spiritual beliefs and practices may help some smokers quit.  Download a "quit meter" on your computer to keep track of quit statistics, such as how long you have gone without smoking, cigarettes not smoked, and money saved.  Get a self-help book about quitting smoking and staying off tobacco. LEARN NEW SKILLS AND BEHAVIORS  Distract yourself from urges to smoke. Talk to someone, go for a walk, or occupy your time with a task.  Change your normal routine. Take a different route to work. Drink tea instead of coffee. Eat breakfast in a different place.  Reduce your stress. Take a hot bath, exercise, or read a book.  Plan something enjoyable to do every day. Reward yourself for not  smoking.  Explore interactive web-based programs that specialize in helping you quit. GET MEDICINE AND USE IT CORRECTLY Medicines can help you stop smoking and decrease the urge to smoke. Combining medicine with the above behavioral methods and support can greatly increase your chances of successfully quitting smoking.  Nicotine replacement therapy helps deliver nicotine to your body without the negative effects and risks of smoking. Nicotine replacement therapy includes nicotine gum, lozenges, inhalers, nasal sprays, and skin patches. Some may be available over-the-counter and others require a prescription.  Antidepressant medicine helps people abstain from smoking, but how this works is unknown. This medicine is available by prescription.  Nicotinic receptor partial agonist medicine simulates the effect of nicotine in your brain. This medicine is available by prescription. Ask your health care provider for advice about which medicines to use and how to use them based on your health history. Your health care provider will tell you what side effects to look out for if you choose to be on a medicine or therapy. Carefully read the information on the package. Do not use any other product containing nicotine while using a nicotine replacement product.  RELAPSE OR DIFFICULT SITUATIONS Most relapses occur within the first 3 months after quitting. Do not be discouraged if you start smoking again. Remember, most people try several times before finally quitting. You may have symptoms of withdrawal because your body is used to nicotine. You may crave cigarettes, be irritable, feel very hungry, cough often, get headaches, or have difficulty concentrating. The withdrawal symptoms are only temporary. They are strongest when you first quit, but they will go away within 10-14 days. To reduce the chances of relapse, try to:  Avoid drinking alcohol. Drinking lowers your chances of successfully quitting.  Reduce the  amount of caffeine you consume. Once you quit smoking, the amount of caffeine in your body increases and can give you symptoms, such as a rapid heartbeat, sweating, and anxiety.  Avoid smokers because they can make you want to smoke.  Do not let weight gain distract you. Many smokers will gain weight when they quit, usually less than 10 pounds. Eat a healthy diet and stay active. You can always lose the weight gained after you quit.  Find ways to improve your mood other than smoking. FOR MORE INFORMATION  www.smokefree.gov  Document Released: 12/07/2001 Document Revised: 04/29/2014 Document Reviewed: 03/23/2012 ExitCare Patient Information 2015 [employer]. This information is not intended to replace advice given to you by your health care provider. Make sure you discuss any questions you have with your health   care provider.    Smoking Cessation, Tips for Success If you are ready to quit smoking, congratulations! You have chosen to help yourself be healthier. Cigarettes bring nicotine, tar, carbon monoxide, and other irritants into your body. Your lungs, heart, and blood vessels will be able to work better without these poisons. There are many different ways to quit smoking. Nicotine gum, nicotine patches, a nicotine inhaler, or nicotine nasal spray can help with physical craving. Hypnosis, support groups, and medicines help break the habit of smoking. WHAT THINGS CAN I DO TO MAKE QUITTING EASIER?  Here are some tips to help you quit for good:  Pick a date when you will quit smoking completely. Tell all of your friends and family about your plan to quit on that date.  Do not try to slowly cut down on the number of cigarettes you are smoking. Pick a quit date and quit smoking completely starting on that day.  Throw away all cigarettes.   Clean and remove all ashtrays from your home, work, and car.  On a card, write down your reasons for quitting. Carry the card with you and read it  when you get the urge to smoke.  Cleanse your body of nicotine. Drink enough water and fluids to keep your urine clear or pale yellow. Do this after quitting to flush the nicotine from your body.  Learn to predict your moods. Do not let a bad situation be your excuse to have a cigarette. Some situations in your life might tempt you into wanting a cigarette.  Never have "just one" cigarette. It leads to wanting another and another. Remind yourself of your decision to quit.  Change habits associated with smoking. If you smoked while driving or when feeling stressed, try other activities to replace smoking. Stand up when drinking your coffee. Brush your teeth after eating. Sit in a different chair when you read the paper. Avoid alcohol while trying to quit, and try to drink fewer caffeinated beverages. Alcohol and caffeine may urge you to smoke.  Avoid foods and drinks that can trigger a desire to smoke, such as sugary or spicy foods and alcohol.  Ask people who smoke not to smoke around you.  Have something planned to do right after eating or having a cup of coffee. For example, plan to take a walk or exercise.  Try a relaxation exercise to calm you down and decrease your stress. Remember, you may be tense and nervous for the first 2 weeks after you quit, but this will pass.  Find new activities to keep your hands busy. Play with a pen, coin, or rubber band. Doodle or draw things on paper.  Brush your teeth right after eating. This will help cut down on the craving for the taste of tobacco after meals. You can also try mouthwash.   Use oral substitutes in place of cigarettes. Try using lemon drops, carrots, cinnamon sticks, or chewing gum. Keep them handy so they are available when you have the urge to smoke.  When you have the urge to smoke, try deep breathing.  Designate your home as a nonsmoking area.  If you are a heavy smoker, ask your health care provider about a prescription for  nicotine chewing gum. It can ease your withdrawal from nicotine.  Reward yourself. Set aside the cigarette money you save and buy yourself something nice.  Look for support from others. Join a support group or smoking cessation program. Ask someone at home or at work   to help you with your plan to quit smoking.  Always ask yourself, "Do I need this cigarette or is this just a reflex?" Tell yourself, "Today, I choose not to smoke," or "I do not want to smoke." You are reminding yourself of your decision to quit.  Do not replace cigarette smoking with electronic cigarettes (commonly called e-cigarettes). The safety of e-cigarettes is unknown, and some may contain harmful chemicals.  If you relapse, do not give up! Plan ahead and think about what you will do the next time you get the urge to smoke. HOW WILL I FEEL WHEN I QUIT SMOKING? You may have symptoms of withdrawal because your body is used to nicotine (the addictive substance in cigarettes). You may crave cigarettes, be irritable, feel very hungry, cough often, get headaches, or have difficulty concentrating. The withdrawal symptoms are only temporary. They are strongest when you first quit but will go away within 10-14 days. When withdrawal symptoms occur, stay in control. Think about your reasons for quitting. Remind yourself that these are signs that your body is healing and getting used to being without cigarettes. Remember that withdrawal symptoms are easier to treat than the major diseases that smoking can cause.  Even after the withdrawal is over, expect periodic urges to smoke. However, these cravings are generally short lived and will go away whether you smoke or not. Do not smoke! WHAT RESOURCES ARE AVAILABLE TO HELP ME QUIT SMOKING? Your health care provider can direct you to community resources or hospitals for support, which may include:  Group support.  Education.  Hypnosis.  Therapy. Document Released: 09/10/2004 Document  Revised: 04/29/2014 Document Reviewed: 05/31/2013 ExitCare Patient Information 2015 [employer]. This information is not intended to replace advice given to you by your health care provider. Make sure you discuss any questions you have with your health care provider.  

## 2015-07-09 NOTE — Addendum Note (Signed)
Addended by: Adria DillELDRIDGE-LEWIS, Jamarquis Crull L on: 07/09/2015 11:09 AM   Modules accepted: Orders

## 2015-07-09 NOTE — Addendum Note (Signed)
Addended by: Melodye PedMANESS-HARRISON, CHANDA C on: 07/09/2015 12:00 PM   Modules accepted: Orders

## 2015-07-15 ENCOUNTER — Ambulatory Visit (INDEPENDENT_AMBULATORY_CARE_PROVIDER_SITE_OTHER): Payer: Medicare Other | Admitting: Pharmacist Clinician (PhC)/ Clinical Pharmacy Specialist

## 2015-07-15 DIAGNOSIS — I482 Chronic atrial fibrillation, unspecified: Secondary | ICD-10-CM

## 2015-07-15 LAB — POCT INR: INR: 1.3

## 2015-07-15 NOTE — Patient Instructions (Signed)
Anticoagulation Dose Instructions as of 07/15/2015      Katie SmilesSun Mon Tue Wed Thu Fri Sat   New Dose 2.5 mg 2.5 mg 5 mg 2.5 mg 2.5 mg 5 mg 2.5 mg    Description        Change back to old schedule.

## 2015-07-21 ENCOUNTER — Telehealth: Payer: Self-pay | Admitting: Nurse Practitioner

## 2015-07-21 ENCOUNTER — Ambulatory Visit (INDEPENDENT_AMBULATORY_CARE_PROVIDER_SITE_OTHER): Payer: Medicare Other | Admitting: Pharmacist

## 2015-07-21 DIAGNOSIS — I482 Chronic atrial fibrillation, unspecified: Secondary | ICD-10-CM

## 2015-07-21 DIAGNOSIS — M545 Low back pain, unspecified: Secondary | ICD-10-CM

## 2015-07-21 DIAGNOSIS — G8929 Other chronic pain: Secondary | ICD-10-CM

## 2015-07-21 LAB — POCT INR: INR: 1.6

## 2015-07-21 MED ORDER — HYDROCODONE-ACETAMINOPHEN 5-325 MG PO TABS: 1.0000 | ORAL_TABLET | Freq: Two times a day (BID) | ORAL | Status: DC | PRN

## 2015-07-21 NOTE — Patient Instructions (Signed)
Anticoagulation Dose Instructions as of 07/21/2015      [name] Tue Wed Thu Fri Sat   New Dose 2.5 mg 2.5 mg 5 mg 2.5 mg 2.5 mg 5 mg 2.5 mg    Description        Take 1 tablet today - Monday, July 25th and continue dose which was recently increased - warfarin  - take 1 tablet on tuesdays and fridays and 1/2 tablet all other days.     INR was 1.6 today (up from 1.3 last week)

## 2015-07-21 NOTE — Telephone Encounter (Signed)
Hydrocodone rx ready for pick up 

## 2015-07-21 NOTE — Telephone Encounter (Signed)
Written Rx at front desk ready for pickup

## 2015-07-29 ENCOUNTER — Observation Stay (HOSPITAL_COMMUNITY)
Admission: EM | Admit: 2015-07-29 | Discharge: 2015-07-30 | Disposition: A | Discharge status: Home / Self Care | Payer: Medicare Other | Attending: Internal Medicine | Admitting: Internal Medicine

## 2015-07-29 ENCOUNTER — Ambulatory Visit (INDEPENDENT_AMBULATORY_CARE_PROVIDER_SITE_OTHER): Payer: Medicare Other | Admitting: Nurse Practitioner

## 2015-07-29 ENCOUNTER — Emergency Department (HOSPITAL_COMMUNITY): Payer: Medicare Other

## 2015-07-29 ENCOUNTER — Encounter (HOSPITAL_COMMUNITY): Payer: Self-pay | Admitting: Emergency Medicine

## 2015-07-29 VITALS — BP 154/100 | HR 102 | Ht 60.0 in

## 2015-07-29 DIAGNOSIS — J449 Chronic obstructive pulmonary disease, unspecified: Secondary | ICD-10-CM | POA: Insufficient documentation

## 2015-07-29 DIAGNOSIS — I2510 Atherosclerotic heart disease of native coronary artery without angina pectoris: Secondary | ICD-10-CM | POA: Diagnosis not present

## 2015-07-29 DIAGNOSIS — I6529 Occlusion and stenosis of unspecified carotid artery: Secondary | ICD-10-CM | POA: Insufficient documentation

## 2015-07-29 DIAGNOSIS — N289 Disorder of kidney and ureter, unspecified: Secondary | ICD-10-CM | POA: Diagnosis not present

## 2015-07-29 DIAGNOSIS — J4489 Other specified chronic obstructive pulmonary disease: Secondary | ICD-10-CM | POA: Diagnosis present

## 2015-07-29 DIAGNOSIS — R0789 Other chest pain: Secondary | ICD-10-CM | POA: Diagnosis not present

## 2015-07-29 DIAGNOSIS — I10 Essential (primary) hypertension: Secondary | ICD-10-CM | POA: Diagnosis present

## 2015-07-29 DIAGNOSIS — Z79899 Other long term (current) drug therapy: Secondary | ICD-10-CM | POA: Insufficient documentation

## 2015-07-29 DIAGNOSIS — I4891 Unspecified atrial fibrillation: Secondary | ICD-10-CM | POA: Diagnosis present

## 2015-07-29 DIAGNOSIS — Z7901 Long term (current) use of anticoagulants: Secondary | ICD-10-CM | POA: Insufficient documentation

## 2015-07-29 DIAGNOSIS — H269 Unspecified cataract: Secondary | ICD-10-CM | POA: Insufficient documentation

## 2015-07-29 DIAGNOSIS — M1990 Unspecified osteoarthritis, unspecified site: Secondary | ICD-10-CM | POA: Diagnosis not present

## 2015-07-29 DIAGNOSIS — I208 Other forms of angina pectoris: Secondary | ICD-10-CM

## 2015-07-29 DIAGNOSIS — R079 Chest pain, unspecified: Secondary | ICD-10-CM | POA: Diagnosis not present

## 2015-07-29 DIAGNOSIS — E785 Hyperlipidemia, unspecified: Secondary | ICD-10-CM | POA: Insufficient documentation

## 2015-07-29 DIAGNOSIS — Z720 Tobacco use: Secondary | ICD-10-CM | POA: Insufficient documentation

## 2015-07-29 DIAGNOSIS — I509 Heart failure, unspecified: Secondary | ICD-10-CM | POA: Insufficient documentation

## 2015-07-29 DIAGNOSIS — K222 Esophageal obstruction: Secondary | ICD-10-CM | POA: Diagnosis not present

## 2015-07-29 DIAGNOSIS — I482 Chronic atrial fibrillation: Secondary | ICD-10-CM | POA: Insufficient documentation

## 2015-07-29 DIAGNOSIS — Z7951 Long term (current) use of inhaled steroids: Secondary | ICD-10-CM | POA: Insufficient documentation

## 2015-07-29 DIAGNOSIS — N179 Acute kidney failure, unspecified: Secondary | ICD-10-CM | POA: Insufficient documentation

## 2015-07-29 DIAGNOSIS — M8580 Other specified disorders of bone density and structure, unspecified site: Secondary | ICD-10-CM | POA: Insufficient documentation

## 2015-07-29 DIAGNOSIS — I2089 Other forms of angina pectoris: Secondary | ICD-10-CM

## 2015-07-29 LAB — CBC
HCT: 47.2 % — ABNORMAL HIGH (ref 36.0–46.0)
Hemoglobin: 15.6 g/dL — ABNORMAL HIGH (ref 12.0–15.0)
MCH: 31.5 pg (ref 26.0–34.0)
MCHC: 33.1 g/dL (ref 30.0–36.0)
MCV: 95.4 fL (ref 78.0–100.0)
Platelets: 187 10*3/uL (ref 150–400)
RBC: 4.95 MIL/uL (ref 3.87–5.11)
RDW: 12.7 % (ref 11.5–15.5)
WBC: 8.5 10*3/uL (ref 4.0–10.5)

## 2015-07-29 LAB — HEPATIC FUNCTION PANEL
ALT: 8 U/L — ABNORMAL LOW (ref 14–54)
AST: 15 U/L (ref 15–41)
Albumin: 3.2 g/dL — ABNORMAL LOW (ref 3.5–5.0)
Alkaline Phosphatase: 46 U/L (ref 38–126)
Bilirubin, Direct: 0.2 mg/dL (ref 0.1–0.5)
Indirect Bilirubin: 0.5 mg/dL (ref 0.3–0.9)
Total Bilirubin: 0.7 mg/dL (ref 0.3–1.2)
Total Protein: 5.8 g/dL — ABNORMAL LOW (ref 6.5–8.1)

## 2015-07-29 LAB — BASIC METABOLIC PANEL
Anion gap: 9 (ref 5–15)
BUN: 21 mg/dL — ABNORMAL HIGH (ref 6–20)
CO2: 31 mmol/L (ref 22–32)
Calcium: 8.6 mg/dL — ABNORMAL LOW (ref 8.9–10.3)
Chloride: 100 mmol/L — ABNORMAL LOW (ref 101–111)
Creatinine, Ser: 1.87 mg/dL — ABNORMAL HIGH (ref 0.44–1.00)
GFR calc Af Amer: 28 mL/min — ABNORMAL LOW (ref >60)
GFR calc non Af Amer: 24 mL/min — ABNORMAL LOW (ref >60)
Glucose, Bld: 111 mg/dL — ABNORMAL HIGH (ref 65–99)
Potassium: 3.8 mmol/L (ref 3.5–5.1)
Sodium: 140 mmol/L (ref 135–145)

## 2015-07-29 LAB — PROTIME-INR
INR: 1.74 — ABNORMAL HIGH (ref 0.00–1.49)
Prothrombin Time: 20.3 seconds — ABNORMAL HIGH (ref 11.6–15.2)

## 2015-07-29 LAB — TROPONIN I
Troponin I: <0.03 ng/mL (ref <0.031)
Troponin I: <0.03 ng/mL (ref <0.031)

## 2015-07-29 LAB — BRAIN NATRIURETIC PEPTIDE: B Natriuretic Peptide: 203.6 pg/mL — ABNORMAL HIGH (ref 0.0–100.0)

## 2015-07-29 MED ORDER — TORSEMIDE 20 MG PO TABS
40.0000 mg | ORAL_TABLET | Freq: Two times a day (BID) | ORAL | Status: DC
  Administered 2015-07-30: 40 mg via ORAL
  Filled 2015-07-29: qty 2

## 2015-07-29 MED ORDER — VITAMIN D 1000 UNITS PO TABS
2000.0000 [IU] | ORAL_TABLET | Freq: Every day | ORAL | Status: DC
  Administered 2015-07-30: 2000 [IU] via ORAL
  Filled 2015-07-29 (×3): qty 2

## 2015-07-29 MED ORDER — POTASSIUM CHLORIDE CRYS ER 20 MEQ PO TBCR
20.0000 meq | EXTENDED_RELEASE_TABLET | Freq: Every day | ORAL | Status: DC
  Administered 2015-07-30: 20 meq via ORAL
  Filled 2015-07-29: qty 1

## 2015-07-29 MED ORDER — SODIUM CHLORIDE 0.9 % IV SOLN
INTRAVENOUS | Status: DC
  Administered 2015-07-29: 23:00:00 via INTRAVENOUS

## 2015-07-29 MED ORDER — ALPRAZOLAM 0.25 MG PO TABS: 0.2500 mg | ORAL_TABLET | Freq: Two times a day (BID) | ORAL | Status: DC | PRN

## 2015-07-29 MED ORDER — DILTIAZEM HCL ER COATED BEADS 180 MG PO CP24
180.0000 mg | ORAL_CAPSULE | Freq: Every day | ORAL | Status: DC
  Administered 2015-07-30: 180 mg via ORAL
  Filled 2015-07-29: qty 1

## 2015-07-29 MED ORDER — PANTOPRAZOLE SODIUM 40 MG PO TBEC
40.0000 mg | DELAYED_RELEASE_TABLET | Freq: Every day | ORAL | Status: DC
  Administered 2015-07-30: 40 mg via ORAL
  Filled 2015-07-29: qty 1

## 2015-07-29 MED ORDER — BUSPIRONE HCL 5 MG PO TABS
7.5000 mg | ORAL_TABLET | Freq: Every morning | ORAL | Status: DC
  Administered 2015-07-30: 7.5 mg via ORAL
  Filled 2015-07-29: qty 2

## 2015-07-29 MED ORDER — TIOTROPIUM BROMIDE MONOHYDRATE 18 MCG IN CAPS
18.0000 ug | ORAL_CAPSULE | Freq: Every day | RESPIRATORY_TRACT | Status: DC
  Administered 2015-07-30: 18 ug via RESPIRATORY_TRACT
  Filled 2015-07-29: qty 5

## 2015-07-29 MED ORDER — WARFARIN SODIUM 5 MG PO TABS: 5.0000 mg | ORAL_TABLET | Freq: Once | ORAL | Status: DC

## 2015-07-29 MED ORDER — HYDROCODONE-ACETAMINOPHEN 5-325 MG PO TABS: 1.0000 | ORAL_TABLET | Freq: Two times a day (BID) | ORAL | Status: DC | PRN

## 2015-07-29 MED ORDER — SODIUM CHLORIDE 0.9 % IV SOLN: INTRAVENOUS | Status: DC

## 2015-07-29 MED ORDER — MOMETASONE FURO-FORMOTEROL FUM 100-5 MCG/ACT IN AERO
2.0000 | INHALATION_SPRAY | Freq: Two times a day (BID) | RESPIRATORY_TRACT | Status: DC
  Administered 2015-07-30: 2 via RESPIRATORY_TRACT
  Filled 2015-07-29: qty 8.8

## 2015-07-29 MED ORDER — ENOXAPARIN SODIUM 30 MG/0.3ML ~~LOC~~ SOLN
30.0000 mg | SUBCUTANEOUS | Status: DC
  Administered 2015-07-29: 30 mg via SUBCUTANEOUS
  Filled 2015-07-29: qty 0.3

## 2015-07-29 MED ORDER — CETYLPYRIDINIUM CHLORIDE 0.05 % MT LIQD: 7.0000 mL | Freq: Two times a day (BID) | OROMUCOSAL | Status: DC

## 2015-07-29 MED ORDER — GI COCKTAIL ~~LOC~~: 30.0000 mL | Freq: Four times a day (QID) | ORAL | Status: DC | PRN

## 2015-07-29 MED ORDER — MORPHINE SULFATE 2 MG/ML IJ SOLN: 2.0000 mg | INTRAMUSCULAR | Status: DC | PRN

## 2015-07-29 MED ORDER — METOPROLOL TARTRATE 50 MG PO TABS
50.0000 mg | ORAL_TABLET | Freq: Two times a day (BID) | ORAL | Status: DC
  Administered 2015-07-29 – 2015-07-30 (×2): 50 mg via ORAL
  Filled 2015-07-29 (×2): qty 1

## 2015-07-29 MED ORDER — ATORVASTATIN CALCIUM 40 MG PO TABS: 40.0000 mg | ORAL_TABLET | Freq: Every day | ORAL | Status: DC

## 2015-07-29 MED ORDER — WARFARIN - PHYSICIAN DOSING INPATIENT: Freq: Every day | Status: DC

## 2015-07-29 MED ORDER — ONDANSETRON HCL 4 MG/2ML IJ SOLN: 4.0000 mg | Freq: Four times a day (QID) | INTRAMUSCULAR | Status: DC | PRN

## 2015-07-29 MED ORDER — ACETAMINOPHEN 325 MG PO TABS
650.0000 mg | ORAL_TABLET | ORAL | Status: DC | PRN
  Administered 2015-07-30: 650 mg via ORAL
  Filled 2015-07-29: qty 2

## 2015-07-29 NOTE — H&P (Signed)
[facility] History and Physical  [name]:[id number] DOB: Apr 14, 1933 DOA: 07/29/2015  Referring physician: Dr Doy Mince PCP: Chevis Pretty, [name]  Chief Complaint: chest pain  HPI: [name] is a 81 y.o. female with hx of HTN, HL, CAF on coumadin, CKD stage IV, DJD, CHF and COPD on home O2.  She presents with hx of chest pain episode today.  Pateint provides the history, her son is here as well.  According to the patient she developed anterior chest pain/ squeezing/ choking in her mid chest this morning around breakfast time.  She took a NTG around 2 pm with some relief. She went to her PCP's office in the afternoon and they saw some EKG changes reportedly (ST depression V2, V3). She was brought to ED by EMS.  She denies associated n/v, SOB or L arm pain.   Patient had MI 20 years ago and has had stents placed, 2 per pt. She is f/b Dr Percival Spanish. She does not have a lot of chest pain.   Denies any recent SOB, prod cough, fevers, dysphagia, odynophagia.  No heartburn.  No abd pain, n/v/d, no joint pains or skin rash   Chart review: Mar 2006 > afib, new onset.  Started on coumadin bridged with Lovenox.      Where does patient live at home Can patient participate in ADLs? yes  Past Medical History  Past Medical History  Diagnosis Date  . Hypertension   . CAD (coronary artery disease) 5/96  . Edema   . Hyperlipidemia   . Chronic atrial fibrillation     Coumadin therapy   . Osteopenia   . Renal insufficiency   . Closed right ankle fracture   . CHF (congestive heart failure)   . LBP (low back pain)   . Arthritis   . Cataract   . COPD (chronic obstructive pulmonary disease)     wears O2 at home as needed  . Esophageal stricture   . Esophageal dysmotility 09/2011    seen on barium esophagram.  . Carotid artery occlusion    Past Surgical History  Past Surgical History  Procedure Laterality Date  . Heart stent      X2  . Mole removed  6578    umbilicus   . Esophagogastroduodenoscopy  2012, 05/2013,09/2013    dilation of esophagus and stricture in 09/2011, 05/2013.  meat disimpaction 09/2013  . Esophagogastroduodenoscopy N/A 10/18/2013    Procedure: ESOPHAGOGASTRODUODENOSCOPY (EGD);  Surgeon: Ladene Artist, MD;  Location: [facility]COPY;  Service: Endoscopy;  Laterality: N/A;  possible food impaction   Family History  Family History  Problem Relation Age of Onset  . Colon cancer Neg Hx   . Esophageal cancer Neg Hx   . Stomach cancer Neg Hx   . Rectal cancer Neg Hx   . Hyperlipidemia Mother   . Hypertension Mother   . Hyperlipidemia Father   . Heart disease Father     After age 62  . Cancer Sister     Lung  . Hyperlipidemia Sister   . Hypertension Sister     Social History  reports that she has been smoking Cigarettes.  She has been smoking about 0.50 packs per day. She has never used smokeless tobacco. She reports that she does not drink alcohol or use illicit drugs. Allergies No Known Allergies Home medications Prior to Admission medications   Medication Sig Start Date End Date Taking? Authorizing Provider  alendronate (FOSAMAX) 70 MG tablet Take 1 tablet (70  mg total) by mouth once a week. Take with a full glass of water on an empty stomach. 04/18/15  Yes Mary-Margaret Hassell Done, [name]  atorvastatin (LIPITOR) 40 MG tablet TAKE 1 TABLET (40 MG TOTAL) BY MOUTH DAILY. 04/18/15  Yes Mary-Margaret Hassell Done, [name]  busPIRone (BUSPAR) 15 MG tablet TAKE 1 TABLET (15 MG TOTAL) BY MOUTH EVERY 8 (EIGHT) HOURS AS NEEDED. 04/18/15  Yes Mary-Margaret Hassell Done, [name]  Cholecalciferol (VITAMIN D) 2000 UNITS tablet Take 2,000 Units by mouth daily.     Yes Historical Provider, MD  dexlansoprazole (DEXILANT) 60 MG capsule TAKE 1 CAPSULE (60 MG TOTAL) BY MOUTH DAILY. 04/18/15  Yes Mary-Margaret Hassell Done, [name]  diltiazem (CARDIZEM CD) 180 MG 24 hr capsule TAKE 1 CAPSULE (180 MG TOTAL) BY MOUTH DAILY. 04/18/15  Yes Mary-Margaret Hassell Done, [name]  Fluticasone-Salmeterol (ADVAIR  DISKUS) 250-50 MCG/DOSE AEPB Inhale 1 puff into the lungs 2 (two) times daily. 06/16/15  Yes Mary-Margaret Hassell Done, [name]  HYDROcodone-acetaminophen (NORCO/VICODIN) 5-325 MG per tablet Take 1 tablet by mouth 2 (two) times daily as needed for moderate pain. 07/21/15  Yes Mary-Margaret Hassell Done, [name]  metoprolol (LOPRESSOR) 50 MG tablet Take 1 tablet (50 mg total) by mouth 2 (two) times daily. 04/18/15  Yes Mary-Margaret Hassell Done, [name]  nitroGLYCERIN (NITROSTAT) 0.4 MG SL tablet Place 1 tablet (0.4 mg total) under the tongue every 5 (five) minutes as needed for chest pain. 04/10/14  Yes Minus Breeding, MD  potassium chloride SA (KLOR-CON M20) 20 MEQ tablet Take 1 tablet (20 mEq total) by mouth daily. 04/18/15  Yes Mary-Margaret Hassell Done, [name]  tiotropium (SPIRIVA HANDIHALER) 18 MCG inhalation capsule PLACE 1 CAPSULE (18 MCG TOTAL) INTO INHALER AND INHALE DAILY. 06/16/15  Yes Mary-Margaret Hassell Done, [name]  torsemide (DEMADEX) 20 MG tablet Take 40 mg by mouth 2 (two) times daily.   Yes Historical Provider, MD  warfarin (COUMADIN) 5 MG tablet TAKE 1 TABLET (5 MG TOTAL) BY MOUTH DAILY. 06/16/15  Yes Mary-Margaret Hassell Done, [name]  torsemide (DEMADEX) 20 MG tablet 2 po BID Patient not taking: Reported on 07/29/2015 06/16/15   Mary-Margaret Hassell Done, [name]   Liver Function Tests  Recent Labs Lab 07/29/15 1826  AST 15  ALT 8*  ALKPHOS 46  BILITOT 0.7  PROT 5.8*  ALBUMIN 3.2*   No results for input(s): LIPASE, AMYLASE in the last 168 hours. CBC  Recent Labs Lab 07/29/15 1826  WBC 8.5  HGB 15.6*  HCT 47.2*  MCV 95.4  PLT 147   Basic Metabolic Panel  Recent Labs Lab 07/29/15 1826  NA 140  K 3.8  CL 100*  CO2 31  GLUCOSE 111*  BUN 21*  CREATININE 1.87*  CALCIUM 8.6*    EKG: Independently reviewed > afib, nonspecific T wave changes, no acute  CXR: independently reviewed > stable CM, no acute disease  Filed Vitals:   07/29/15 1922 07/29/15 1930 07/29/15 1945 07/29/15 2000  BP: 164/105 164/79 149/100 166/90   Pulse: 95 90 89 95  Temp:      TempSrc:      Resp: _0 SpO2: 98% 99% 98% 94%   Exam: Elderly pleasant female, no distress, [place] O2 No rash, cyanosis or gangrene Sclera anicteric, throat clear No jvd Chest bibasilar crackles 1/3 up, no wheezing Irreg rhythm, no MRG Abd soft ntnd no mass or ascites GU deferred Ext no LE edema, wounds, ulcers Neuro is alert, O x3   Na 140 K 3.8  Creat 1.87  BUN 21   eGFR 24  Glu 111  Alb 3.2 LFT's wnl  BNP 203  Trop <0.03 WBC 8k  Hb 15  plt 187  INR 1.74  Assessment: 1. Chest pain episode / hx CAD w stents - trop negative at this time and EKG afib w/o acute changes. Pain free now , plan admit overnight, serial troponins, telemetry, r/o MI.   2. CKD stage IV 3. COPD home O2 4. Chronic afib on coumadin, INR 1.7 5. HTN 6. HL 7. DNR - patient request with son in room at time of discussion  Plan - as above   DVT Prophylaxis lovenox  Code Status: DNR  Family Communication: son at bedside  Disposition Plan: hoem when stable    Bowman D [facility] Pager [phone]  If 7PM-7AM, please contact night-coverage [email or web address] Password [facility] 07/29/2015, 8:48 PM

## 2015-07-29 NOTE — Progress Notes (Signed)
   Subjective:    Patient ID: [name], female    DOB: [birth year], 81 y.o.   MRN: [id number]  HPI Patient brought in by daugther stating that she was c/o chest pain that started this morning- she took a  nitrglycerin around 2 this afternoon because pain would not ease by itself. The nitroglycerin helped- rated pain 03/31/09 and 1/10 a few minutes after taking nitro. SH eis denying SOB currenly.    Review of Systems  Constitutional: Negative for diaphoresis.  HENT: Negative.   Respiratory: Positive for chest tightness. Negative for shortness of breath.   Cardiovascular: Positive for chest pain.  Gastrointestinal: Negative.   Endocrine: Negative.   Genitourinary: Negative.   Neurological: Negative.   Psychiatric/Behavioral: Negative.   All other systems reviewed and are negative.      Objective:   Physical Exam  Constitutional: She is oriented to person, place, and time. She appears well-developed and well-nourished.  Cardiovascular: Normal rate and normal heart sounds.   Irregular rhythym  Pulmonary/Chest: She has rales (left lower lobe).  Neurological: She is alert and oriented to person, place, and time.  Skin: Skin is warm.  Psychiatric: She has a normal mood and affect. Her behavior is normal. Judgment and thought content normal.    BP 154/100 mmHg  Pulse 102  Ht 5' (1.524 m)  SpO2 94%  LMP 03/27/1983   EKG- St depression in V4-V5- irregular atrial fib O2 via nasal cannula at 2L IV NS KVO 20g left 18g left wrist EMS arrived at 4:50       Assessment & Plan:   1. Chest pain, unspecified chest pain type    To [facility] via EMS RTO prn  Mary-Margaret Daphine Deutscher, [name]

## 2015-07-29 NOTE — ED Provider Notes (Signed)
CSN: [id number]     Arrival date & time 07/29/15  1807 History   First MD Initiated Contact with Patient 07/29/15 1808     Chief Complaint  Patient presents with  . Chest Pain     (Consider location/radiation/quality/duration/timing/severity/associated sxs/prior Treatment) Patient is a 81 y.o. female presenting with chest pain. The history is provided by the patient and medical records.  Chest Pain   This is a 81 year old female with history of hypertension, hyperlipidemia, chronic A. fib on Coumadin, CAD status post stenting 2, COPD on chronic oxygen, congestive heart failure, presenting to the ED for chest pain. Patient states pain began earlier this morning, localized throughout her entire chest. She describes pain as a squeezing sensation. She has chronic shortness of breath, denies any worsening from baseline. No diaphoresis, nausea, vomiting, numbness, weakness, or feelings of syncope. Patient took one sublingual nitroglycerin and pain resolved. She was seen by her PCP, had EKG performed which showed some new changes and thus was sent to the ED for further evaluation. Patient remains asymptomatic on her arrival. She is followed by cardiology, Dr. Antoine Poche.  She cannot take aspirin due to her Coumadin use. Past Medical History  Diagnosis Date  . Hypertension   . CAD (coronary artery disease) 5/96  . Edema   . Hyperlipidemia   . Chronic atrial fibrillation     Coumadin therapy   . Osteopenia   . Renal insufficiency   . Closed right ankle fracture   . CHF (congestive heart failure)   . LBP (low back pain)   . Arthritis   . Cataract   . COPD (chronic obstructive pulmonary disease)     wears O2 at home as needed  . Esophageal stricture   . Esophageal dysmotility 09/2011    seen on barium esophagram.  . Carotid artery occlusion    Past Surgical History  Procedure Laterality Date  . Heart stent      X2  . Mole removed  2013    umbilicus  . Esophagogastroduodenoscopy  2012,  05/2013,09/2013    dilation of esophagus and stricture in 09/2011, 05/2013.  meat disimpaction 09/2013  . Esophagogastroduodenoscopy N/A 10/18/2013    Procedure: ESOPHAGOGASTRODUODENOSCOPY (EGD);  Surgeon: Meryl Dare, MD;  Location: [facility] ENDOSCOPY;  Service: Endoscopy;  Laterality: N/A;  possible food impaction   Family History  Problem Relation Age of Onset  . Colon cancer Neg Hx   . Esophageal cancer Neg Hx   . Stomach cancer Neg Hx   . Rectal cancer Neg Hx   . Hyperlipidemia Mother   . Hypertension Mother   . Hyperlipidemia Father   . Heart disease Father     After age 60  . Cancer Sister     Lung  . Hyperlipidemia Sister   . Hypertension Sister    History  Substance Use Topics  . Smoking status: Current Every Day Smoker -- 0.50 packs/day    Types: Cigarettes  . Smokeless tobacco: Never Used  . Alcohol Use: No     Comment: previous   OB History    No data available     Review of Systems  Cardiovascular: Positive for chest pain.  All other systems reviewed and are negative.     Allergies  Review of patient's allergies indicates no known allergies.  Home Medications   Prior to Admission medications   Medication Sig Start Date End Date Taking? Authorizing Provider  alendronate (FOSAMAX) 70 MG tablet Take 1 tablet (70 mg total) by mouth  once a week. Take with a full glass of water on an empty stomach. 04/18/15   Mary-Margaret Daphine Deutscher, [name]  atorvastatin (LIPITOR) 40 MG tablet TAKE 1 TABLET (40 MG TOTAL) BY MOUTH DAILY. 04/18/15   Mary-Margaret Daphine Deutscher, [name]  busPIRone (BUSPAR) 15 MG tablet TAKE 1 TABLET (15 MG TOTAL) BY MOUTH EVERY 8 (EIGHT) HOURS AS NEEDED. 04/18/15   Mary-Margaret Daphine Deutscher, [name]  Cholecalciferol (VITAMIN D) 2000 UNITS tablet Take 2,000 Units by mouth daily.      Historical Provider, MD  dexlansoprazole (DEXILANT) 60 MG capsule TAKE 1 CAPSULE (60 MG TOTAL) BY MOUTH DAILY. 04/18/15   Mary-Margaret Daphine Deutscher, [name]  diltiazem (CARDIZEM CD) 180 MG 24 hr capsule TAKE  1 CAPSULE (180 MG TOTAL) BY MOUTH DAILY. 04/18/15   Mary-Margaret Daphine Deutscher, [name]  Fluticasone-Salmeterol (ADVAIR DISKUS) 250-50 MCG/DOSE AEPB Inhale 1 puff into the lungs 2 (two) times daily. 06/16/15   Mary-Margaret Daphine Deutscher, [name]  HYDROcodone-acetaminophen (NORCO/VICODIN) 5-325 MG per tablet Take 1 tablet by mouth 2 (two) times daily as needed for moderate pain. 07/21/15   Mary-Margaret Daphine Deutscher, [name]  metoprolol (LOPRESSOR) 50 MG tablet Take 1 tablet (50 mg total) by mouth 2 (two) times daily. 04/18/15   Mary-Margaret Daphine Deutscher, [name]  nitroGLYCERIN (NITROSTAT) 0.4 MG SL tablet Place 1 tablet (0.4 mg total) under the tongue every 5 (five) minutes as needed for chest pain. 04/10/14   Rollene Rotunda, MD  potassium chloride SA (KLOR-CON M20) 20 MEQ tablet Take 1 tablet (20 mEq total) by mouth daily. 04/18/15   Mary-Margaret Daphine Deutscher, [name]  tiotropium (SPIRIVA HANDIHALER) 18 MCG inhalation capsule PLACE 1 CAPSULE (18 MCG TOTAL) INTO INHALER AND INHALE DAILY. 06/16/15   Mary-Margaret Daphine Deutscher, [name]  torsemide (DEMADEX) 20 MG tablet 2 po BID 06/16/15   Mary-Margaret Daphine Deutscher, [name]  warfarin (COUMADIN) 5 MG tablet TAKE 1 TABLET (5 MG TOTAL) BY MOUTH DAILY. 06/16/15   Mary-Margaret Daphine Deutscher, [name]   BP 169/100 mmHg  Pulse 97  Temp(Src) 97.9 F (36.6 C) (Oral)  Resp 16  SpO2 100%  LMP 03/27/1983   Physical Exam  Constitutional: She is oriented to person, place, and time. She appears well-developed and well-nourished. No distress.  HENT:  Head: Normocephalic and atraumatic.  Mouth/Throat: Oropharynx is clear and moist.  Eyes: Conjunctivae and EOM are normal. Pupils are equal, round, and reactive to light.  Neck: Normal range of motion. Neck supple.  Cardiovascular: Normal rate, regular rhythm and normal heart sounds.   Pulmonary/Chest: Effort normal and breath sounds normal. No respiratory distress. She has no wheezes.  Coarse breath sounds bilaterally, 2l O2 via [place] in use (baseline), no distress  Abdominal: Soft. Bowel sounds are  normal.  Musculoskeletal: Normal range of motion. She exhibits no edema.  Neurological: She is alert and oriented to person, place, and time.  Skin: Skin is warm and dry. She is not diaphoretic.  Psychiatric: She has a normal mood and affect.  Nursing note and vitals reviewed.   ED Course  Procedures (including critical care time) Labs Review Labs Reviewed  BASIC METABOLIC PANEL - Abnormal; Notable for the following:    Chloride 100 (*)    Glucose, Bld 111 (*)    BUN 21 (*)    Creatinine, Ser 1.87 (*)    Calcium 8.6 (*)    GFR calc non Af Amer 24 (*)    GFR calc Af Amer 28 (*)    All other components within normal limits  CBC - Abnormal; Notable for the following:    Hemoglobin 15.6 (*)  HCT 47.2 (*)    All other components within normal limits  PROTIME-INR - Abnormal; Notable for the following:    Prothrombin Time 20.3 (*)    INR 1.74 (*)    All other components within normal limits  HEPATIC FUNCTION PANEL - Abnormal; Notable for the following:    Total Protein 5.8 (*)    Albumin 3.2 (*)    ALT 8 (*)    All other components within normal limits  BRAIN NATRIURETIC PEPTIDE - Abnormal; Notable for the following:    B Natriuretic Peptide 203.6 (*)    All other components within normal limits  TROPONIN I    Imaging Review Dg Chest 2 View  07/29/2015   CLINICAL DATA:  One day history of chest pain.  EXAM: CHEST  2 VIEW  COMPARISON:  02/24/2015  FINDINGS: The heart is enlarged but stable. There is tortuosity, ectasia and calcification of the thoracic aorta. The lungs are clear. No pleural effusion. The bony thorax is intact.  IMPRESSION: No acute cardiopulmonary findings.  Stable cardiac enlargement.   Electronically Signed   By: Rudie Meyer M.D.   On: 07/29/2015 19:21     EKG Interpretation   Date/Time:  Tuesday July 29 2015 18:10:54 EDT Ventricular Rate:  90 PR Interval:    QRS Duration: 102 QT Interval:  341 QTC Calculation: 417 R Axis:   64 Text  Interpretation:  Atrial fibrillation Nonspecific T abnormalities,  lateral leads Baseline wander in lead(s) III V1 compared to prior, now has  a fib with nonspecific t wave abnormalities Confirmed by [facility]  MD, TREY  ([id number]) on 07/29/2015 7:18:34 PM      MDM   Final diagnoses:  Chest pain, unspecified chest pain type  AKI (acute kidney injury)   81 year old female here with episode of chest pain this morning. She has no cardiac history, followed by Dr. Antoine Poche.  Currently pain free.  EKG does reveal lateral t-wave changes when compared with prior.  Lab work overall reassuring-- slight increased in SrCr compared with previous, possibly due to dehydration.  IVF given.  Trop negative.  INR slightly sub-therapeutic.  Chest x-ray is clear.  Patient remains pain-free here in the emergency department. I discussed case with on call cardiology, Dr. Mayford Knife, feels that since troponin is negative and patient remains pain-free she may be admitted to medicine service for rule out. However if troponin bumps, she would like to be notified.  Garlon Hatchet, PA-C 07/29/15 2033  Blake Divine, MD 07/31/15 [phone]

## 2015-07-29 NOTE — ED Notes (Signed)
Per EMS: started having cp this am, lasted all day, took a ntg, pain relieved, went to physician's office and they saw some changes in lead V2 and V3 (depression), denies pain right now.  VSS,.  169/108, 98%, HR 88, on 2 liters [place] at home as needed.  IV started at physicians office.

## 2015-07-30 ENCOUNTER — Observation Stay (HOSPITAL_BASED_OUTPATIENT_CLINIC_OR_DEPARTMENT_OTHER): Payer: Medicare Other

## 2015-07-30 DIAGNOSIS — I2510 Atherosclerotic heart disease of native coronary artery without angina pectoris: Secondary | ICD-10-CM | POA: Diagnosis not present

## 2015-07-30 DIAGNOSIS — J449 Chronic obstructive pulmonary disease, unspecified: Secondary | ICD-10-CM | POA: Diagnosis not present

## 2015-07-30 DIAGNOSIS — R079 Chest pain, unspecified: Secondary | ICD-10-CM | POA: Diagnosis not present

## 2015-07-30 DIAGNOSIS — I10 Essential (primary) hypertension: Secondary | ICD-10-CM | POA: Diagnosis not present

## 2015-07-30 DIAGNOSIS — I482 Chronic atrial fibrillation: Secondary | ICD-10-CM

## 2015-07-30 DIAGNOSIS — N179 Acute kidney failure, unspecified: Secondary | ICD-10-CM | POA: Diagnosis not present

## 2015-07-30 LAB — TROPONIN I: Troponin I: <0.03 ng/mL (ref <0.031)

## 2015-07-30 LAB — PROTIME-INR
INR: 1.81 — ABNORMAL HIGH (ref 0.00–1.49)
Prothrombin Time: 20.9 seconds — ABNORMAL HIGH (ref 11.6–15.2)

## 2015-07-30 MED ORDER — WARFARIN SODIUM 5 MG PO TABS
5.0000 mg | ORAL_TABLET | ORAL | Status: AC
  Administered 2015-07-30: 5 mg via ORAL
  Filled 2015-07-30: qty 1

## 2015-07-30 MED ORDER — ISOSORBIDE MONONITRATE ER 30 MG PO TB24: 30.0000 mg | ORAL_TABLET | Freq: Every day | ORAL | Status: DC

## 2015-07-30 MED ORDER — TORSEMIDE 20 MG PO TABS: 20.0000 mg | ORAL_TABLET | Freq: Two times a day (BID) | ORAL | Status: DC

## 2015-07-30 MED ORDER — ASPIRIN EC 81 MG PO TBEC
81.0000 mg | DELAYED_RELEASE_TABLET | Freq: Every day | ORAL | Status: DC
  Administered 2015-07-30: 81 mg via ORAL
  Filled 2015-07-30: qty 1

## 2015-07-30 MED ORDER — ASPIRIN 81 MG PO TBEC: 81.0000 mg | DELAYED_RELEASE_TABLET | Freq: Every day | ORAL | Status: DC

## 2015-07-30 MED ORDER — ALBUTEROL SULFATE (2.5 MG/3ML) 0.083% IN NEBU
INHALATION_SOLUTION | RESPIRATORY_TRACT | Status: AC
  Administered 2015-07-30: 2.5 mg
  Filled 2015-07-30: qty 3

## 2015-07-30 MED ORDER — WARFARIN SODIUM 7.5 MG PO TABS: 7.5000 mg | ORAL_TABLET | ORAL | Status: DC

## 2015-07-30 MED ORDER — WARFARIN - PHARMACIST DOSING INPATIENT
Freq: Every day | Status: DC
Start: 2015-07-30 — End: 2015-07-30

## 2015-07-30 MED ORDER — ISOSORBIDE MONONITRATE ER 30 MG PO TB24
30.0000 mg | ORAL_TABLET | Freq: Every day | ORAL | Status: DC
  Administered 2015-07-30: 30 mg via ORAL
  Filled 2015-07-30: qty 1

## 2015-07-30 NOTE — Discharge Summary (Signed)
Physician Discharge Summary  [name] MRN: [id number] DOB/AGE: 1933-01-16 81 y.o.  PCP: Chevis Pretty, [name]   Admit date: 07/29/2015 Discharge date: 07/30/2015  Discharge Diagnoses:     Active Problems:   Essential hypertension, benign   Atrial fibrillation   COPD (chronic obstructive pulmonary disease) with chronic bronchitis   Acute chest pain   Angina at rest    Follow-up recommendations Follow-up with PCP in 3-5 days , including although additional recommended appointments as below Follow-up CBC, CMP, INR in 3-5 days Dose of Demadex reduced due to increasing creatinine patient advised to resume this on 8/8 only if renal function is stable Patient needs a nephrology follow-up for stage IV chronic kidney disease    Medication List    TAKE these medications        alendronate 70 MG tablet  Commonly known as:  FOSAMAX  Take 1 tablet (70 mg total) by mouth once a week. Take with a full glass of water on an empty stomach.     aspirin 81 MG EC tablet  Take 1 tablet (81 mg total) by mouth daily.     atorvastatin 40 MG tablet  Commonly known as:  LIPITOR  TAKE 1 TABLET (40 MG TOTAL) BY MOUTH DAILY.     busPIRone 15 MG tablet  Commonly known as:  BUSPAR  TAKE 1 TABLET (15 MG TOTAL) BY MOUTH EVERY 8 (EIGHT) HOURS AS NEEDED.     dexlansoprazole 60 MG capsule  Commonly known as:  DEXILANT  TAKE 1 CAPSULE (60 MG TOTAL) BY MOUTH DAILY.     diltiazem 180 MG 24 hr capsule  Commonly known as:  CARDIZEM CD  TAKE 1 CAPSULE (180 MG TOTAL) BY MOUTH DAILY.     Fluticasone-Salmeterol 250-50 MCG/DOSE Aepb  Commonly known as:  ADVAIR DISKUS  Inhale 1 puff into the lungs 2 (two) times daily.     HYDROcodone-acetaminophen 5-325 MG per tablet  Commonly known as:  NORCO/VICODIN  Take 1 tablet by mouth 2 (two) times daily as needed for moderate pain.     isosorbide mononitrate 30 MG 24 hr tablet  Commonly known as:  IMDUR  Take 1 tablet (30 mg total) by mouth daily.      metoprolol 50 MG tablet  Commonly known as:  LOPRESSOR  Take 1 tablet (50 mg total) by mouth 2 (two) times daily.     nitroGLYCERIN 0.4 MG SL tablet  Commonly known as:  NITROSTAT  Place 1 tablet (0.4 mg total) under the tongue every 5 (five) minutes as needed for chest pain.     potassium chloride SA 20 MEQ tablet  Commonly known as:  KLOR-CON M20  Take 1 tablet (20 mEq total) by mouth daily.     tiotropium 18 MCG inhalation capsule  Commonly known as:  SPIRIVA HANDIHALER  PLACE 1 CAPSULE (18 MCG TOTAL) INTO INHALER AND INHALE DAILY.     torsemide 20 MG tablet  Commonly known as:  DEMADEX  Take 40 mg by mouth 2 (two) times daily.     Vitamin D 2000 UNITS tablet  Take 2,000 Units by mouth daily.     warfarin 5 MG tablet  Commonly known as:  COUMADIN  TAKE 1 TABLET (5 MG TOTAL) BY MOUTH DAILY.         Discharge Condition: Stable  Disposition: 81-Discharged to home/self-care with a planned acute care hospital inpt readmission   Consults: * Cardiology    Significant Diagnostic Studies:  Dg Chest 2 View  07/29/2015   CLINICAL DATA:  One day history of chest pain.  EXAM: CHEST  2 VIEW  COMPARISON:  02/24/2015  FINDINGS: The heart is enlarged but stable. There is tortuosity, ectasia and calcification of the thoracic aorta. The lungs are clear. No pleural effusion. The bony thorax is intact.  IMPRESSION: No acute cardiopulmonary findings.  Stable cardiac enlargement.   Electronically Signed   By: Marijo Sanes M.D.   On: 07/29/2015 19:21    2-D echo pending at this time   [facility] Weights   07/29/15 2151 07/30/15 0423  Weight: 71.623 kg (157 lb 14.4 oz) 71.169 kg (156 lb 14.4 oz)     Microbiology: No results found for this or any previous visit (from the past 240 hour(s)).     Blood Culture No results found for: SDES, SPECREQUEST, CULT, REPTSTATUS    Labs: Results for orders placed or performed during the hospital encounter of 07/29/15 (from the past 48  hour(s))  Basic metabolic panel     Status: Abnormal   Collection Time: 07/29/15  6:26 PM  Result Value Ref Range   Sodium 140 135 - 145 mmol/L   Potassium 3.8 3.5 - 5.1 mmol/L   Chloride 100 (L) 101 - 111 mmol/L   CO2 31 22 - 32 mmol/L   Glucose, Bld 111 (H) 65 - 99 mg/dL   BUN 21 (H) 6 - 20 mg/dL   Creatinine, Ser 1.87 (H) 0.44 - 1.00 mg/dL   Calcium 8.6 (L) 8.9 - 10.3 mg/dL   GFR calc non Af Amer 24 (L) >60 mL/min   GFR calc Af Amer 28 (L) >60 mL/min    Comment: (NOTE) The eGFR has been calculated using the CKD EPI equation. This calculation has not been validated in all clinical situations. eGFR's persistently <60 mL/min signify possible Chronic Kidney Disease.    Anion gap 9 5 - 15  CBC     Status: Abnormal   Collection Time: 07/29/15  6:26 PM  Result Value Ref Range   WBC 8.5 4.0 - 10.5 K/uL   RBC 4.95 3.87 - 5.11 MIL/uL   Hemoglobin 15.6 (H) 12.0 - 15.0 g/dL   HCT 47.2 (H) 36.0 - 46.0 %   MCV 95.4 78.0 - 100.0 fL   MCH 31.5 26.0 - 34.0 pg   MCHC 33.1 30.0 - 36.0 g/dL   RDW 12.7 11.5 - 15.5 %   Platelets 187 150 - 400 K/uL  Protime-INR - (order if Patient is taking Coumadin / Warfarin)     Status: Abnormal   Collection Time: 07/29/15  6:26 PM  Result Value Ref Range   Prothrombin Time 20.3 (H) 11.6 - 15.2 seconds   INR 1.74 (H) 0.00 - 1.49  Troponin I     Status: None   Collection Time: 07/29/15  6:26 PM  Result Value Ref Range   Troponin I <0.03 <0.031 ng/mL    Comment:        NO INDICATION OF MYOCARDIAL INJURY.   Hepatic function panel     Status: Abnormal   Collection Time: 07/29/15  6:26 PM  Result Value Ref Range   Total Protein 5.8 (L) 6.5 - 8.1 g/dL   Albumin 3.2 (L) 3.5 - 5.0 g/dL   AST 15 15 - 41 U/L   ALT 8 (L) 14 - 54 U/L   Alkaline Phosphatase 46 38 - 126 U/L   Total Bilirubin 0.7 0.3 - 1.2 mg/dL   Bilirubin, Direct 0.2 0.1 - 0.5 mg/dL  Indirect Bilirubin 0.5 0.3 - 0.9 mg/dL  Brain natriuretic peptide     Status: Abnormal   Collection Time:  07/29/15  6:28 PM  Result Value Ref Range   B Natriuretic Peptide 203.6 (H) 0.0 - 100.0 pg/mL  Troponin I-serum (0, 3, 6 hours)     Status: None   Collection Time: 07/29/15 10:53 PM  Result Value Ref Range   Troponin I <0.03 <0.031 ng/mL    Comment:        NO INDICATION OF MYOCARDIAL INJURY.   Troponin I-serum (0, 3, 6 hours)     Status: None   Collection Time: 07/30/15 12:31 AM  Result Value Ref Range   Troponin I <0.03 <0.031 ng/mL    Comment:        NO INDICATION OF MYOCARDIAL INJURY.   Protime-INR     Status: Abnormal   Collection Time: 07/30/15 12:31 AM  Result Value Ref Range   Prothrombin Time 20.9 (H) 11.6 - 15.2 seconds   INR 1.81 (H) 0.00 - 1.49     Lipid Panel     Component Value Date/Time   CHOL 135 06/16/2015 1211   CHOL 141 02/19/2015 1551   CHOL 158 06/25/2013 1122   TRIG 128 06/16/2015 1211   TRIG 105 02/19/2015 1551   TRIG 155* 06/25/2013 1122   HDL 49 06/16/2015 1211   HDL 53 02/19/2015 1551   HDL 47 06/25/2013 1122   HDL 43 12/12/2012   CHOLHDL 2.8 06/16/2015 1211   LDLCALC 60 06/16/2015 1211   LDLCALC 78 07/26/2014 1233   LDLCALC 80 06/25/2013 1122   LDLCALC 70 12/12/2012     Lab Results  Component Value Date   HGBA1C 6.2 07/05/2013     Lab Results  Component Value Date   LDLCALC 60 06/16/2015   CREATININE 1.87* 07/29/2015     HPI :[name] is a 62F with CAD s/p remote PCI x2, chronic atrial fibrillation, HTN, HL, CKD IV, COPD and ongoing tobacco abuse who presented to the ED for CP. Yesterday AM she developed 6-7/10 substernal chest pressure at rest. It did not radiate and was not associated with SOB, n/v or diaphoresis. The chest pressure lasted for several hours and was worse with ambulation. Her pain improved nitroglycerin. She walks with a cane at baseline and is typically able to ambulate through her home without difficulty. She had SOB at baseline with more vigorous activity. She is unsure whether this was like her  symptoms that required her to get PCI 20 years ago. Since that time she has not had any episodes like this.   [name] presented to clinic yesterday afternoon to have her CP evaluated. They referred her to the ED. In the ED she had 3 sets of negative cardiac enzymes and she was hemodynamically stable. She was admitted to the medicine service for care.  [name] continues to smoke 0.5 ppd. She has cut back but is unable to quit. She is not interested in assistance at this time.   HOSPITAL COURSE:  Chest pain  Cardiac enzymes within normal limits and no new ischemic changes on EKG Patient reluctant to undergo stress testing, would not like to pursue any invasive cardiac workup even if the stress test was positive She would like to go home, cardiology saw the patient and discussed this extensively with her Medical management recommended 2-D echo ordered and pending at this time Cardiology advice to start the patient on aspirin and Imdur 86m daily and follow-up with  Dr. Percival Spanish in 2 weeks   Other medical problems 1. CKD stage IV-creatinine slowly trending up. Patient advised to hold Demadex until seen by PCP. Patient is to get her labs rechecked and then resume Demadex on 8/8 at 20 mg twice a day 2. COPD home O2 stable 3. Chronic afib on coumadin, INR 1.7, subtherapeutic this admission, continue Coumadin and recheck INR in 3-5 days 4. HTN -stable 5. Hyperlipidemia-continue statin    Discharge Exam:    Blood pressure 156/87, pulse 85, temperature 97.9 F (36.6 C), temperature source Oral, resp. rate 22, height 5' (1.524 m), weight 71.169 kg (156 lb 14.4 oz), last menstrual period 03/27/1983, SpO2 97 %. Elderly pleasant female, no distress, [place] O2 No rash, cyanosis or gangrene Sclera anicteric, throat clear No jvd Chest bibasilar crackles 1/3 up, no wheezing Irreg rhythm, no MRG Abd soft ntnd no mass or ascites GU deferred Ext no LE edema, wounds, ulcers Neuro is alert, O x3           Discharge Instructions    Diet - low sodium heart healthy    Complete by:  As directed      Increase activity slowly    Complete by:  As directed            Follow-up Information    Follow up with Chevis Pretty, [name]. Schedule an appointment as soon as possible for a visit in 3 days.   Specialty:  Nurse Practitioner   Contact information:   [place] [address] [phone]       Follow up with Minus Breeding, MD. Schedule an appointment as soon as possible for a visit in 2 weeks.   Specialty:  Cardiology   Contact information:   [address] [place] [address] [phone]       Signed: Reyne Dumas 07/30/2015, 11:08 AM        Time spent >45 mins

## 2015-07-30 NOTE — Progress Notes (Signed)
*  PRELIMINARY RESULTS* Echocardiogram 2D Echocardiogram has been performed.  [name] 07/30/2015, 1:42 PM

## 2015-07-30 NOTE — Discharge Instructions (Signed)
Information on my medicine - Coumadin®   (Warfarin) ° °This medication education was reviewed with me or my healthcare representative as part of my discharge preparation.  The pharmacist that spoke with me during my hospital stay was:  Faraaz Wolin Prescott, RPH ° °Why was Coumadin prescribed for you? °Coumadin was prescribed for you because you have a blood clot or a medical condition that can cause an increased risk of forming blood clots. Blood clots can cause serious health problems by blocking the flow of blood to the heart, lung, or brain. Coumadin can prevent harmful blood clots from forming. °As a reminder your indication for Coumadin is:   Select from menu ° °What test will check on my response to Coumadin? °While on Coumadin (warfarin) you will need to have an INR test regularly to ensure that your dose is keeping you in the desired range. The INR (international normalized ratio) number is calculated from the result of the laboratory test called prothrombin time (PT). ° °If an INR APPOINTMENT HAS NOT ALREADY BEEN MADE FOR YOU please schedule an appointment to have this lab work done by your health care provider within 7 days. °Your INR goal is usually a number between:  2 to 3 or your provider may give you a more narrow range like 2-2.5.  Ask your health care provider during an office visit what your goal INR is. ° °What  do you need to  know  About  COUMADIN? °Take Coumadin (warfarin) exactly as prescribed by your healthcare provider about the same time each day.  DO NOT stop taking without talking to the doctor who prescribed the medication.  Stopping without other blood clot prevention medication to take the place of Coumadin may increase your risk of developing a new clot or stroke.  Get refills before you run out. ° °What do you do if you miss a dose? °If you miss a dose, take it as soon as you remember on the same day then continue your regularly scheduled regimen the next day.  Do not take two doses  of Coumadin at the same time. ° °Important Safety Information °A possible side effect of Coumadin (Warfarin) is an increased risk of bleeding. You should call your healthcare provider right away if you experience any of the following: °  Bleeding from an injury or your nose that does not stop. °  Unusual colored urine (red or dark brown) or unusual colored stools (red or black). °  Unusual bruising for unknown reasons. °  A serious fall or if you hit your head (even if there is no bleeding). ° °Some foods or medicines interact with Coumadin® (warfarin) and might alter your response to warfarin. To help avoid this: °  Eat a balanced diet, maintaining a consistent amount of Vitamin K. °  Notify your provider about major diet changes you plan to make. °  Avoid alcohol or limit your intake to 1 drink for women and 2 drinks for men per day. °(1 drink is 5 oz. wine, 12 oz. beer, or 1.5 oz. liquor.) ° °Make sure that ANY health care provider who prescribes medication for you knows that you are taking Coumadin (warfarin).  Also make sure the healthcare provider who is monitoring your Coumadin knows when you have started a new medication including herbals and non-prescription products. ° °Coumadin® (Warfarin)  Major Drug Interactions  °Increased Warfarin Effect Decreased Warfarin Effect  °Alcohol (large quantities) °Antibiotics (esp. Septra/Bactrim, Flagyl, Cipro) °Amiodarone (Cordarone) °Aspirin (ASA) °Cimetidine (Tagamet) °  Megestrol (Megace) °NSAIDs (ibuprofen, naproxen, etc.) °Piroxicam (Feldene) °Propafenone (Rythmol SR) °Propranolol (Inderal) °Isoniazid (INH) °Posaconazole (Noxafil) Barbiturates (Phenobarbital) °Carbamazepine (Tegretol) °Chlordiazepoxide (Librium) °Cholestyramine (Questran) °Griseofulvin °Oral Contraceptives °Rifampin °Sucralfate (Carafate) °Vitamin K  ° °Coumadin® (Warfarin) Major Herbal Interactions  °Increased Warfarin Effect Decreased Warfarin Effect  °Garlic °Ginseng °Ginkgo biloba Coenzyme  Q10 °Green tea °St. John’s wort   ° °Coumadin® (Warfarin) FOOD Interactions  °Eat a consistent number of servings per week of foods HIGH in Vitamin K °(1 serving = ½ cup)  °Collards (cooked, or boiled & drained) °Kale (cooked, or boiled & drained) °Mustard greens (cooked, or boiled & drained) °Parsley *serving size only = ¼ cup °Spinach (cooked, or boiled & drained) °Swiss chard (cooked, or boiled & drained) °Turnip greens (cooked, or boiled & drained)  °Eat a consistent number of servings per week of foods MEDIUM-HIGH in Vitamin K °(1 serving = 1 cup)  °Asparagus (cooked, or boiled & drained) °Broccoli (cooked, boiled & drained, or raw & chopped) °Brussel sprouts (cooked, or boiled & drained) *serving size only = ½ cup °Lettuce, raw (green leaf, endive, romaine) °Spinach, raw °Turnip greens, raw & chopped  ° °These websites have more information on Coumadin (warfarin):  www.coumadin.com; °www.ahrq.gov/consumer/coumadin.htm; ° ° °

## 2015-07-30 NOTE — Progress Notes (Addendum)
ANTICOAGULATION CONSULT NOTE - Initial Consult  Pharmacy Consult for Coumadin Indication:  H/o CAF No Known Allergies  Patient Measurements: Height: 5' (152.4 cm) Weight: 156 lb 14.4 oz (71.169 kg) IBW/kg (Calculated) : 45.5   Vital Signs: Temp: 97.9 F (36.6 C) (08/03 0423) Temp Source: Oral (08/03 0423) BP: 156/87 mmHg (08/03 0423) Pulse Rate: 85 (08/03 0423)  Labs:  Recent Labs  07/29/15 1826 07/29/15 2253 07/30/15 0031  HGB 15.6*  --   --   HCT 47.2*  --   --   PLT 187  --   --   LABPROT 20.3*  --  20.9*  INR 1.74*  --  1.81*  CREATININE 1.87*  --   --   TROPONINI <0.03 <0.03 <0.03    Estimated Creatinine Clearance: 21.5 mL/min (by C-G formula based on Cr of 1.87).   Medical History: Past Medical History  Diagnosis Date  . Hypertension   . CAD (coronary artery disease) 5/96  . Edema   . Hyperlipidemia   . Chronic atrial fibrillation     Coumadin therapy   . Osteopenia   . Renal insufficiency   . Closed right ankle fracture   . CHF (congestive heart failure)   . LBP (low back pain)   . Arthritis   . Cataract   . COPD (chronic obstructive pulmonary disease)     wears O2 at home as needed  . Esophageal stricture   . Esophageal dysmotility 09/2011    seen on barium esophagram.  . Carotid artery occlusion     Medications:  Prescriptions prior to admission  Medication Sig Dispense Refill Last Dose  . alendronate (FOSAMAX) 70 MG tablet Take 1 tablet (70 mg total) by mouth once a week. Take with a full glass of water on an empty stomach. 12 tablet 1 07/28/2015 at Unknown time  . atorvastatin (LIPITOR) 40 MG tablet TAKE 1 TABLET (40 MG TOTAL) BY MOUTH DAILY. 90 tablet 1 07/29/2015 at Unknown time  . busPIRone (BUSPAR) 15 MG tablet TAKE 1 TABLET (15 MG TOTAL) BY MOUTH EVERY 8 (EIGHT) HOURS AS NEEDED. 90 tablet 1 07/29/2015 at Unknown time  . Cholecalciferol (VITAMIN D) 2000 UNITS tablet Take 2,000 Units by mouth daily.     07/29/2015 at Unknown time  .  dexlansoprazole (DEXILANT) 60 MG capsule TAKE 1 CAPSULE (60 MG TOTAL) BY MOUTH DAILY. 90 capsule 1 07/29/2015 at Unknown time  . diltiazem (CARDIZEM CD) 180 MG 24 hr capsule TAKE 1 CAPSULE (180 MG TOTAL) BY MOUTH DAILY. 90 capsule 1 07/29/2015 at Unknown time  . Fluticasone-Salmeterol (ADVAIR DISKUS) 250-50 MCG/DOSE AEPB Inhale 1 puff into the lungs 2 (two) times daily. 60 each 5 07/29/2015 at Unknown time  . HYDROcodone-acetaminophen (NORCO/VICODIN) 5-325 MG per tablet Take 1 tablet by mouth 2 (two) times daily as needed for moderate pain. 60 tablet 0 07/29/2015 at Unknown time  . metoprolol (LOPRESSOR) 50 MG tablet Take 1 tablet (50 mg total) by mouth 2 (two) times daily. 180 tablet 1 07/29/2015 at 0800  . nitroGLYCERIN (NITROSTAT) 0.4 MG SL tablet Place 1 tablet (0.4 mg total) under the tongue every 5 (five) minutes as needed for chest pain. 25 tablet 6 07/29/2015 at Unknown time  . potassium chloride SA (KLOR-CON M20) 20 MEQ tablet Take 1 tablet (20 mEq total) by mouth daily. 90 tablet 1 07/29/2015 at Unknown time  . tiotropium (SPIRIVA HANDIHALER) 18 MCG inhalation capsule PLACE 1 CAPSULE (18 MCG TOTAL) INTO INHALER AND INHALE DAILY. 90 capsule 1  07/29/2015 at Unknown time  . torsemide (DEMADEX) 20 MG tablet Take 40 mg by mouth 2 (two) times daily.   07/29/2015 at Unknown time  . warfarin (COUMADIN) 5 MG tablet TAKE 1 TABLET (5 MG TOTAL) BY MOUTH DAILY. 90 tablet 1 07/28/2015 at Unknown time  . torsemide (DEMADEX) 20 MG tablet 2 po BID (Patient not taking: Reported on 07/29/2015) 360 tablet 0 Not Taking at Unknown time   Scheduled:  . albuterol      . antiseptic oral rinse  7 mL Mouth Rinse BID  . aspirin EC  81 mg Oral Daily  . atorvastatin  40 mg Oral q1800  . busPIRone  7.5 mg Oral q morning - 10a  . cholecalciferol  2,000 Units Oral Daily  . diltiazem  180 mg Oral Daily  . enoxaparin (LOVENOX) injection  30 mg Subcutaneous Q24H  . isosorbide mononitrate  30 mg Oral Daily  . metoprolol  50 mg Oral BID  .  mometasone-formoterol  2 puff Inhalation BID  . pantoprazole  40 mg Oral Daily  . potassium chloride SA  20 mEq Oral Daily  . tiotropium  18 mcg Inhalation Daily  . torsemide  40 mg Oral BID  . warfarin  5 mg Oral ONCE-1800  . Warfarin - Pharmacist Dosing Inpatient   Does not apply q1800    Assessment: 81 y.o female presented to [facility] on 8/2 PM with complaint of chest pain. She is taking coumadin PTA for CAF.  PTA dose of coumadin was  qTue/Fri and 2.5mg  qMWThSS (per anticoag office visit note on 07/21/15 and confirmed with patient). Last taken PTA on 07/28/15.  No coumadin given yesterday 07/29/15.  INR on admit 8/2 was subtherapeutic at 1.74.  Today the INR is 1.81, subtherapeutic.   Lovenox  SQ q24h started on admission. H/H 15.6/47.2 and pltc 187K on admission last PM. No bleeding noted.   Goal of Therapy:  INR 2-3 Monitor platelets by anticoagulation protocol: Yes   Plan:  Coumadin 5 mg po x1 - give dose this AM secondary missed dose on 07/29/15.  (No dose tonight at 18:00) Daily INR Coumadin education reviewed with the patient this AM.   Thank you for allowing pharmacy to be part of this patients care team. Noah Delaine, RPh Clinical Pharmacist Pager: [phone] 07/30/2015,9:30 AM

## 2015-07-30 NOTE — Consult Note (Addendum)
Patient ID: [name] MRN: [id number] DOB/AGE: 08-28-1933 81 y.o.  Admit date: 07/29/2015 Primary Physician Mary-Margaret Daphine Deutscher, NP Primary Cardiologist Rollene Rotunda, MD   Chief Complaint  Chest pain  HPI: [name] is a 63F with CAD s/p remote PCI x2, chronic atrial fibrillation, HTN, HL, CKD IV, COPD and ongoing tobacco abuse who presented to the ED for CP.  Yesterday AM she developed 6-7/10 substernal chest pressure at rest.  It did not radiate and was not associated with SOB, n/v or diaphoresis.  The chest pressure lasted for several hours and was worse with ambulation.   Her pain improved nitroglycerin.   She walks with a cane at baseline and is typically able to ambulate through her home without difficulty.  She had SOB at baseline with more vigorous activity.  She is unsure whether this was like her symptoms that required her to get PCI 20 years ago.  Since that time she has not had any episodes like this.    Ms. [name] presented to clinic yesterday afternoon to have her CP evaluated.  They referred her to the ED.  In the ED she had 3 sets of negative cardiac enzymes and she was hemodynamically stable.  She was admitted to the medicine service for care.  Ms. [name] continues to smoke 0.5 ppd.  She has cut back but is unable to quit.  She is not interested in assistance at this time.   Review of Systems:     Cardiac Review of Systems: {Y] = yes [ ]  = no  Chest Pain [ x   ]  Resting SOB [   ] Exertional SOB  [x  ]  Orthopnea [  ]   Pedal Edema [   ]    Palpitations [  ] Syncope  [  ]   Presyncope [   ]  General Review of Systems: [Y] = yes [  ]=no Constitional: recent weight change [  ]; anorexia [  ]; fatigue [  ]; nausea [  ]; night sweats [  ]; fever [  ]; or chills [  ];                                                                     Eyes : blurred vision [  ]; diplopia [   ]; vision changes [  ];  Amaurosis fugax[  ]; Resp: cough [  ];  wheezing[  ];  hemoptysis[  ];   PND [  ];  GI:  gallstones[  ], vomiting[  ];  dysphagia[  ]; melena[  ];  hematochezia [  ]; heartburn[  ];   GU: kidney stones [  ]; hematuria[  ];   dysuria [  ];  nocturia[  ]; incontinence [  ];             Skin: rash, swelling[  ];, hair loss[  ];  peripheral edema[  ];  or itching[  ]; Musculosketetal: myalgias[  ];  joint swelling[  ];  joint erythema[  ];  joint pain[  ];  back pain[  ];  Heme/Lymph: bruising[  ];  bleeding[  ];  anemia[  ];  Neuro: TIA[  ];  headaches[  ];  stroke[  ];  vertigo[  ];  seizures[  ];   paresthesias[  ];  difficulty walking[  ];  Psych:depression[  ]; anxiety[  ];  Endocrine: diabetes[  ];  thyroid dysfunction[  ];  Other:  Past Medical History  Diagnosis Date  . Hypertension   . CAD (coronary artery disease) 5/96  . Edema   . Hyperlipidemia   . Chronic atrial fibrillation     Coumadin therapy   . Osteopenia   . Renal insufficiency   . Closed right ankle fracture   . CHF (congestive heart failure)   . LBP (low back pain)   . Arthritis   . Cataract   . COPD (chronic obstructive pulmonary disease)     wears O2 at home as needed  . Esophageal stricture   . Esophageal dysmotility 09/2011    seen on barium esophagram.  . Carotid artery occlusion     Medications Prior to Admission  Medication Sig Dispense Refill  . alendronate (FOSAMAX) 70 MG tablet Take 1 tablet (70 mg total) by mouth once a week. Take with a full glass of water on an empty stomach. 12 tablet 1  . atorvastatin (LIPITOR) 40 MG tablet TAKE 1 TABLET (40 MG TOTAL) BY MOUTH DAILY. 90 tablet 1  . busPIRone (BUSPAR) 15 MG tablet TAKE 1 TABLET (15 MG TOTAL) BY MOUTH EVERY 8 (EIGHT) HOURS AS NEEDED. 90 tablet 1  . Cholecalciferol (VITAMIN D) 2000 UNITS tablet Take 2,000 Units by mouth daily.      Marland Kitchen dexlansoprazole (DEXILANT) 60 MG capsule TAKE 1 CAPSULE (60 MG TOTAL) BY MOUTH DAILY. 90 capsule 1  . diltiazem (CARDIZEM CD) 180 MG 24 hr capsule TAKE 1 CAPSULE (180 MG TOTAL) BY MOUTH  DAILY. 90 capsule 1  . Fluticasone-Salmeterol (ADVAIR DISKUS) 250-50 MCG/DOSE AEPB Inhale 1 puff into the lungs 2 (two) times daily. 60 each 5  . HYDROcodone-acetaminophen (NORCO/VICODIN) 5-325 MG per tablet Take 1 tablet by mouth 2 (two) times daily as needed for moderate pain. 60 tablet 0  . metoprolol (LOPRESSOR) 50 MG tablet Take 1 tablet (50 mg total) by mouth 2 (two) times daily. 180 tablet 1  . nitroGLYCERIN (NITROSTAT) 0.4 MG SL tablet Place 1 tablet (0.4 mg total) under the tongue every 5 (five) minutes as needed for chest pain. 25 tablet 6  . potassium chloride SA (KLOR-CON M20) 20 MEQ tablet Take 1 tablet (20 mEq total) by mouth daily. 90 tablet 1  . tiotropium (SPIRIVA HANDIHALER) 18 MCG inhalation capsule PLACE 1 CAPSULE (18 MCG TOTAL) INTO INHALER AND INHALE DAILY. 90 capsule 1  . torsemide (DEMADEX) 20 MG tablet Take 40 mg by mouth 2 (two) times daily.    Marland Kitchen warfarin (COUMADIN) 5 MG tablet TAKE 1 TABLET (5 MG TOTAL) BY MOUTH DAILY. 90 tablet 1  . torsemide (DEMADEX) 20 MG tablet 2 po BID (Patient not taking: Reported on 07/29/2015) 360 tablet 0     . antiseptic oral rinse  7 mL Mouth Rinse BID  . atorvastatin  40 mg Oral q1800  . busPIRone  7.5 mg Oral q morning - 10a  . cholecalciferol  2,000 Units Oral Daily  . diltiazem  180 mg Oral Daily  . enoxaparin (LOVENOX) injection  30 mg Subcutaneous Q24H  . metoprolol  50 mg Oral BID  . mometasone-formoterol  2 puff Inhalation BID  . pantoprazole  40 mg Oral Daily  . potassium chloride SA  20 mEq Oral Daily  . tiotropium  18 mcg  Inhalation Daily  . torsemide  40 mg Oral BID  . warfarin  5 mg Oral ONCE-1800  . Warfarin - Physician Dosing Inpatient   Does not apply q1800    Infusions: . sodium chloride 50 mL/hr at 07/29/15 2240    No Known Allergies  History   Social History  . Marital Status: Widowed    Spouse Name: N/A  . Number of Children: N/A  . Years of Education: N/A   Occupational History  . retired     Social History Main Topics  . Smoking status: Current Every Day Smoker -- 0.50 packs/day    Types: Cigarettes  . Smokeless tobacco: Never Used  . Alcohol Use: No     Comment: previous  . Drug Use: No  . Sexual Activity: Not on file   Other Topics Concern  . Not on file   Social History Narrative    Family History  Problem Relation Age of Onset  . Colon cancer Neg Hx   . Esophageal cancer Neg Hx   . Stomach cancer Neg Hx   . Rectal cancer Neg Hx   . Hyperlipidemia Mother   . Hypertension Mother   . Hyperlipidemia Father   . Heart disease Father     After age 21  . Cancer Sister     Lung  . Hyperlipidemia Sister   . Hypertension Sister     PHYSICAL EXAM: Filed Vitals:   07/30/15 0423  BP: 156/87  Pulse: 85  Temp: 97.9 F (36.6 C)  Resp: 22     Intake/Output Summary (Last 24 hours) at 07/30/15 0838 Last data filed at 07/29/15 2200  Gross per 24 hour  Intake    240 ml  Output      0 ml  Net    240 ml    General:  Chronically ill-appearing. No respiratory difficulty HEENT: normal Neck: supple. no JVD. Carotids 2+ bilat; no bruits. No lymphadenopathy or thryomegaly appreciated. Cor: PMI nondisplaced. Irregularly irregular. No rubs, gallops or murmurs. Lungs: clear Abdomen: soft, nontender, nondistended. No hepatosplenomegaly. No bruits or masses. Good bowel sounds. Extremities: no cyanosis, clubbing, rash, edema Neuro: alert & oriented x 3, cranial nerves grossly intact. moves all 4 extremities w/o difficulty. Affect pleasant.  Results for orders placed or performed during the hospital encounter of 07/29/15 (from the past 24 hour(s))  Basic metabolic panel     Status: Abnormal   Collection Time: 07/29/15  6:26 PM  Result Value Ref Range   Sodium 140 135 - 145 mmol/L   Potassium 3.8 3.5 - 5.1 mmol/L   Chloride 100 (L) 101 - 111 mmol/L   CO2 31 22 - 32 mmol/L   Glucose, Bld 111 (H) 65 - 99 mg/dL   BUN 21 (H) 6 - 20 mg/dL   Creatinine, Ser 1.61 (H)  0.44 - 1.00 mg/dL   Calcium 8.6 (L) 8.9 - 10.3 mg/dL   GFR calc non Af Amer 24 (L) >60 mL/min   GFR calc Af Amer 28 (L) >60 mL/min   Anion gap 9 5 - 15  CBC     Status: Abnormal   Collection Time: 07/29/15  6:26 PM  Result Value Ref Range   WBC 8.5 4.0 - 10.5 K/uL   RBC 4.95 3.87 - 5.11 MIL/uL   Hemoglobin 15.6 (H) 12.0 - 15.0 g/dL   HCT 09.6 (H) 04.5 - 40.9 %   MCV 95.4 78.0 - 100.0 fL   MCH 31.5 26.0 - 34.0 pg  MCHC 33.1 30.0 - 36.0 g/dL   RDW 16.1 09.6 - 04.5 %   Platelets 187 150 - 400 K/uL  Protime-INR - (order if Patient is taking Coumadin / Warfarin)     Status: Abnormal   Collection Time: 07/29/15  6:26 PM  Result Value Ref Range   Prothrombin Time 20.3 (H) 11.6 - 15.2 seconds   INR 1.74 (H) 0.00 - 1.49  Troponin I     Status: None   Collection Time: 07/29/15  6:26 PM  Result Value Ref Range   Troponin I <0.03 <0.031 ng/mL  Hepatic function panel     Status: Abnormal   Collection Time: 07/29/15  6:26 PM  Result Value Ref Range   Total Protein 5.8 (L) 6.5 - 8.1 g/dL   Albumin 3.2 (L) 3.5 - 5.0 g/dL   AST 15 15 - 41 U/L   ALT 8 (L) 14 - 54 U/L   Alkaline Phosphatase 46 38 - 126 U/L   Total Bilirubin 0.7 0.3 - 1.2 mg/dL   Bilirubin, Direct 0.2 0.1 - 0.5 mg/dL   Indirect Bilirubin 0.5 0.3 - 0.9 mg/dL  Brain natriuretic peptide     Status: Abnormal   Collection Time: 07/29/15  6:28 PM  Result Value Ref Range   B Natriuretic Peptide 203.6 (H) 0.0 - 100.0 pg/mL  Troponin I-serum (0, 3, 6 hours)     Status: None   Collection Time: 07/29/15 10:53 PM  Result Value Ref Range   Troponin I <0.03 <0.031 ng/mL  Troponin I-serum (0, 3, 6 hours)     Status: None   Collection Time: 07/30/15 12:31 AM  Result Value Ref Range   Troponin I <0.03 <0.031 ng/mL  Protime-INR     Status: Abnormal   Collection Time: 07/30/15 12:31 AM  Result Value Ref Range   Prothrombin Time 20.9 (H) 11.6 - 15.2 seconds   INR 1.81 (H) 0.00 - 1.49   Dg Chest 2 View  07/29/2015   CLINICAL DATA:   One day history of chest pain.  EXAM: CHEST  2 VIEW  COMPARISON:  02/24/2015  FINDINGS: The heart is enlarged but stable. There is tortuosity, ectasia and calcification of the thoracic aorta. The lungs are clear. No pleural effusion. The bony thorax is intact.  IMPRESSION: No acute cardiopulmonary findings.  Stable cardiac enlargement.   Electronically Signed   By: Rudie Meyer M.D.   On: 07/29/2015 19:21     ECG: Atrial fibrillation at 86 bpm.  CXR: No acute cardiopulmonary disease  ASSESSMENT: Ms. Ketterman is a 52F with CAD s/p remote PCI x2, chronic atrial fibrillation, HTN, HL, CKD IV, COPD and ongoing tobacco abuse who presented to the ED for CP.  Her symptoms are concerning for angina given her history and multiple risk factors.  Cardiac enzymes are wnl x3 and there are no new ischemic changes on ECG.  Ms. Michalski is reluctant to undergo stress testing and is fairly certain that she would not want to pursue cardiac catheterization even if inducible ischemia were found on stress.  She feels well now and wants to go home.  We discussed the fact that this is likely angina, and could recur.  She elects conservative management.  Ms. Helman hypertension and hyperlipidemia are well-managed.  No changes at this time. INR has been consistently sub-therapeutic.  Recommend pharmacy consult for warfarin management.  PLAN/DISCUSSION: - Agree with obtaining echo while inpatient - Start ASA 81mg  daily - Start Imdur 30mg  daily - Follow up with  Dr. Antoine Poche in 2 weeks - Pharmacy c/s for warfarin management  Signed: Madilyn Hook 07/30/2015, 8:38 AM

## 2015-07-31 ENCOUNTER — Telehealth: Payer: Self-pay | Admitting: Cardiology

## 2015-07-31 NOTE — Telephone Encounter (Signed)
Spoke with Leotis Shames, RN with [facility] and provided information requested for patient to enroll in HR program to receive scale, etc

## 2015-07-31 NOTE — Telephone Encounter (Signed)
New message     Need to confirm diagnosis of CHF, get most recent ejection fraction, BP and HR

## 2015-08-04 ENCOUNTER — Ambulatory Visit (INDEPENDENT_AMBULATORY_CARE_PROVIDER_SITE_OTHER): Payer: Medicare Other | Admitting: Nurse Practitioner

## 2015-08-04 ENCOUNTER — Encounter: Payer: Self-pay | Admitting: Nurse Practitioner

## 2015-08-04 VITALS — BP 138/85 | HR 89 | Temp 97.0°F | Ht 60.0 in | Wt 158.0 lb

## 2015-08-04 DIAGNOSIS — I5032 Chronic diastolic (congestive) heart failure: Secondary | ICD-10-CM

## 2015-08-04 DIAGNOSIS — I482 Chronic atrial fibrillation, unspecified: Secondary | ICD-10-CM

## 2015-08-04 DIAGNOSIS — J449 Chronic obstructive pulmonary disease, unspecified: Secondary | ICD-10-CM | POA: Diagnosis not present

## 2015-08-04 DIAGNOSIS — Z09 Encounter for follow-up examination after completed treatment for conditions other than malignant neoplasm: Secondary | ICD-10-CM | POA: Diagnosis not present

## 2015-08-04 LAB — POCT INR: INR: 1.4

## 2015-08-04 NOTE — Progress Notes (Signed)
   Subjective:    Patient ID: [name], female    DOB: April 24, 1933, 81 y.o.   MRN: [id number]  HPI  [name] was seen in office on 07/29/15 with chest pain and some minor EKG changes- was transported to ER- Was kept over night for evaluation. Was dx with chest pain at rest/angina. SHe was discharged on 07/30/15 home with medication change of daily daily ASA and imdur and held her demadex until today. Patient says that she is doing much better    Review of Systems  Constitutional: Negative.   HENT: Negative.   Respiratory: Negative.   Cardiovascular: Negative.   Genitourinary: Negative.   Neurological: Negative.   All other systems reviewed and are negative.      Objective:   Physical Exam  Constitutional: She is oriented to person, place, and time. She appears well-developed and well-nourished.  Cardiovascular: Normal rate, regular rhythm and normal heart sounds.   Pulmonary/Chest: Effort normal and breath sounds normal.  Musculoskeletal: She exhibits no edema.  Neurological: She is alert and oriented to person, place, and time.  Skin: Skin is warm and dry.  Psychiatric: She has a normal mood and affect. Her behavior is normal. Judgment and thought content normal.   BP 138/85 mmHg  Pulse 89  Temp(Src) 97 F (36.1 C) (Oral)  Ht 5' (1.524 m)  Wt 158 lb (71.668 kg)  BMI 30.86 kg/m2  LMP 03/27/1983        Assessment & Plan:   1. Hospital discharge follow-up   2. Chronic diastolic congestive heart failure   hospital records reviewed Continue current meds Follow up in 3 months and prn  ,Bennie Pierini, [name]

## 2015-08-04 NOTE — Patient Instructions (Addendum)
Anticoagulation Dose Instructions as of 08/04/2015      Glynis Smiles Tue Wed Thu Fri Sat   New Dose 2.5 mg 2.5 mg 5 mg 2.5 mg 2.5 mg 5 mg 2.5 mg    Description        1 tue and friablet daily for the next 3 days then back to normal dose of 0.5 tablet daily except whole tablet on Tuesday and friday     .follw up Tuesday         Heart Failure Heart failure is a condition in which the heart has trouble pumping blood. This means your heart does not pump blood efficiently for your body to work well. In some cases of heart failure, fluid may back up into your lungs or you may have swelling (edema) in your lower legs. Heart failure is usually a long-term (chronic) condition. It is important for you to take good care of yourself and follow your health care provider's treatment plan. CAUSES  Some health conditions can cause heart failure. Those health conditions include:  High blood pressure (hypertension). Hypertension causes the heart muscle to work harder than normal. When pressure in the blood vessels is high, the heart needs to pump (contract) with more force in order to circulate blood throughout the body. High blood pressure eventually causes the heart to become stiff and weak.  Coronary artery disease (CAD). CAD is the buildup of cholesterol and fat (plaque) in the arteries of the heart. The blockage in the arteries deprives the heart muscle of oxygen and blood. This can cause chest pain and may lead to a heart attack. High blood pressure can also contribute to CAD.  Heart attack (myocardial infarction). A heart attack occurs when one or more arteries in the heart become blocked. The loss of oxygen damages the muscle tissue of the heart. When this happens, part of the heart muscle dies. The injured tissue does not contract as well and weakens the heart's ability to pump blood.  Abnormal heart valves. When the heart valves do not open and close properly, it can cause heart failure. This makes the  heart muscle pump harder to keep the blood flowing.  Heart muscle disease (cardiomyopathy or myocarditis). Heart muscle disease is damage to the heart muscle from a variety of causes. These can include drug or alcohol abuse, infections, or unknown reasons. These can increase the risk of heart failure.  Lung disease. Lung disease makes the heart work harder because the lungs do not work properly. This can cause a strain on the heart, leading it to fail.  Diabetes. Diabetes increases the risk of heart failure. High blood sugar contributes to high fat (lipid) levels in the blood. Diabetes can also cause slow damage to tiny blood vessels that carry important nutrients to the heart muscle. When the heart does not get enough oxygen and food, it can cause the heart to become weak and stiff. This leads to a heart that does not contract efficiently.  Other conditions can contribute to heart failure. These include abnormal heart rhythms, thyroid problems, and low blood counts (anemia). Certain unhealthy behaviors can increase the risk of heart failure, including:  Being overweight.  Smoking or chewing tobacco.  Eating foods high in fat and cholesterol.  Abusing illicit drugs or alcohol.  Lacking physical activity. SYMPTOMS  Heart failure symptoms may vary and can be hard to detect. Symptoms may include:  Shortness of breath with activity, such as climbing stairs.  Persistent cough.  Swelling of  the feet, ankles, legs, or abdomen.  Unexplained weight gain.  Difficulty breathing when lying flat (orthopnea).  Waking from sleep because of the need to sit up and get more air.  Rapid heartbeat.  Fatigue and loss of energy.  Feeling light-headed, dizzy, or close to fainting.  Loss of appetite.  Nausea.  Increased urination during the night (nocturia). DIAGNOSIS  A diagnosis of heart failure is based on your history, symptoms, physical examination, and diagnostic tests. Diagnostic tests  for heart failure may include:  Echocardiography.  Electrocardiography.  Chest X-ray.  Blood tests.  Exercise stress test.  Cardiac angiography.  Radionuclide scans. TREATMENT  Treatment is aimed at managing the symptoms of heart failure. Medicines, behavioral changes, or surgical intervention may be necessary to treat heart failure.  Medicines to help treat heart failure may include:  Angiotensin-converting enzyme (ACE) inhibitors. This type of medicine blocks the effects of a blood protein called angiotensin-converting enzyme. ACE inhibitors relax (dilate) the blood vessels and help lower blood pressure.  Angiotensin receptor blockers (ARBs). This type of medicine blocks the actions of a blood protein called angiotensin. Angiotensin receptor blockers dilate the blood vessels and help lower blood pressure.  Water pills (diuretics). Diuretics cause the kidneys to remove salt and water from the blood. The extra fluid is removed through urination. This loss of extra fluid lowers the volume of blood the heart pumps.  Beta blockers. These prevent the heart from beating too fast and improve heart muscle strength.  Digitalis. This increases the force of the heartbeat.  Healthy behavior changes include:  Obtaining and maintaining a healthy weight.  Stopping smoking or chewing tobacco.  Eating heart-healthy foods.  Limiting or avoiding alcohol.  Stopping illicit drug use.  Physical activity as directed by your health care provider.  Surgical treatment for heart failure may include:  A procedure to open blocked arteries, repair damaged heart valves, or remove damaged heart muscle tissue.  A pacemaker to improve heart muscle function and control certain abnormal heart rhythms.  An internal cardioverter defibrillator to treat certain serious abnormal heart rhythms.  A left ventricular assist device (LVAD) to assist the pumping ability of the heart. HOME CARE INSTRUCTIONS    Take medicines only as directed by your health care provider. Medicines are important in reducing the workload of your heart, slowing the progression of heart failure, and improving your symptoms.  Do not stop taking your medicine unless directed by your health care provider.  Do not skip any dose of medicine.  Refill your prescriptions before you run out of medicine. Your medicines are needed every day.  Engage in moderate physical activity if directed by your health care provider. Moderate physical activity can benefit some people. The elderly and people with severe heart failure should consult with a health care provider for physical activity recommendations.  Eat heart-healthy foods. Food choices should be free of trans fat and low in saturated fat, cholesterol, and salt (sodium). Healthy choices include fresh or frozen fruits and vegetables, fish, lean meats, legumes, fat-free or low-fat dairy products, and whole grain or high fiber foods. Talk to a dietitian to learn more about heart-healthy foods.  Limit sodium if directed by your health care provider. Sodium restriction may reduce symptoms of heart failure in some people. Talk to a dietitian to learn more about heart-healthy seasonings.  Use healthy cooking methods. Healthy cooking methods include roasting, grilling, broiling, baking, poaching, steaming, or stir-frying. Talk to a dietitian to learn more about healthy cooking methods.  Limit fluids if directed by your health care provider. Fluid restriction may reduce symptoms of heart failure in some people.  Weigh yourself every day. Daily weights are important in the early recognition of excess fluid. You should weigh yourself every morning after you urinate and before you eat breakfast. Wear the same amount of clothing each time you weigh yourself. Record your daily weight. Provide your health care provider with your weight record.  Monitor and record your blood pressure if  directed by your health care provider.  Check your pulse if directed by your health care provider.  Lose weight if directed by your health care provider. Weight loss may reduce symptoms of heart failure in some people.  Stop smoking or chewing tobacco. Nicotine makes your heart work harder by causing your blood vessels to constrict. Do not use nicotine gum or patches before talking to your health care provider.  Keep all follow-up visits as directed by your health care provider. This is important.  Limit alcohol intake to no more than 1 drink per day for nonpregnant women and 2 drinks per day for men. One drink equals 12 ounces of beer, 5 ounces of wine, or 1 ounces of hard liquor. Drinking more than that is harmful to your heart. Tell your health care provider if you drink alcohol several times a week. Talk with your health care provider about whether alcohol is safe for you. If your heart has already been damaged by alcohol or you have severe heart failure, drinking alcohol should be stopped completely.  Stop illicit drug use.  Stay up-to-date with immunizations. It is especially important to prevent respiratory infections through current pneumococcal and influenza immunizations.  Manage other health conditions such as hypertension, diabetes, thyroid disease, or abnormal heart rhythms as directed by your health care provider.  Learn to manage stress.  Plan rest periods when fatigued.  Learn strategies to manage high temperatures. If the weather is extremely hot:  Avoid vigorous physical activity.  Use air conditioning or fans or seek a cooler location.  Avoid caffeine and alcohol.  Wear loose-fitting, lightweight, and light-colored clothing.  Learn strategies to manage cold temperatures. If the weather is extremely cold:  Avoid vigorous physical activity.  Layer clothes.  Wear mittens or gloves, a hat, and a scarf when going outside.  Avoid alcohol.  Obtain ongoing  education and support as needed.  Participate in or seek rehabilitation as needed to maintain or improve independence and quality of life. SEEK MEDICAL CARE IF:   Your weight increases by 03 lb/1.4 kg in 1 day or 05 lb/2.3 kg in a week.  You have increasing shortness of breath that is unusual for you.  You are unable to participate in your usual physical activities.  You tire easily.  You cough more than normal, especially with physical activity.  You have any or more swelling in areas such as your hands, feet, ankles, or abdomen.  You are unable to sleep because it is hard to breathe.  You feel like your heart is beating fast (palpitations).  You become dizzy or light-headed upon standing up. SEEK IMMEDIATE MEDICAL CARE IF:   You have difficulty breathing.  There is a change in mental status such as decreased alertness or difficulty with concentration.  You have a pain or discomfort in your chest.  You have an episode of fainting (syncope). MAKE SURE YOU:   Understand these instructions.  Will watch your condition.  Will get help right away if you are  not doing well or get worse. Document Released: 12/13/2005 Document Revised: 04/29/2014 Document Reviewed: 01/12/2013 [facility] Patient Information 2015 [place], [place]. This information is not intended to replace advice given to you by your health care provider. Make sure you discuss any questions you have with your health care provider.

## 2015-08-06 ENCOUNTER — Encounter: Payer: Self-pay | Admitting: Cardiology

## 2015-08-06 ENCOUNTER — Other Ambulatory Visit (INDEPENDENT_AMBULATORY_CARE_PROVIDER_SITE_OTHER): Payer: Medicare Other

## 2015-08-06 ENCOUNTER — Ambulatory Visit (INDEPENDENT_AMBULATORY_CARE_PROVIDER_SITE_OTHER): Payer: Medicare Other | Admitting: Cardiology

## 2015-08-06 VITALS — BP 122/80 | HR 92 | Ht 60.0 in | Wt 158.0 lb

## 2015-08-06 DIAGNOSIS — I481 Persistent atrial fibrillation: Secondary | ICD-10-CM | POA: Diagnosis not present

## 2015-08-06 DIAGNOSIS — I2510 Atherosclerotic heart disease of native coronary artery without angina pectoris: Secondary | ICD-10-CM | POA: Diagnosis not present

## 2015-08-06 DIAGNOSIS — I4819 Other persistent atrial fibrillation: Secondary | ICD-10-CM

## 2015-08-06 NOTE — Progress Notes (Signed)
HPI The patient returns for followup of atrial fibrillation and coronary disease.   Since I last saw her she was hospitalized and I reviewed these records. She had chest discomfort ruled out for myocardial infarction. It was suggested that she had stress testing but she declined this. She was started on Imdur and referred for follow-up.  She reports that she only had one episode of chest discomfort at that time and that this went away with one nitroglycerin. She said that since going home from the hospital she's had no further chest discomfort. She gets around slowly with chronic dyspnea. She wears oxygen when necessary. With her decreased level of activity she denies any new cardiovascular symptoms. The patient denies any new symptoms such as chest discomfort, neck or arm discomfort. There has been no new shortness of breath, PND or orthopnea. There have been no reported palpitations, presyncope or syncope.   No Known Allergies  Current Outpatient Prescriptions  Medication Sig Dispense Refill  . alendronate (FOSAMAX) 70 MG tablet Take 1 tablet (70 mg total) by mouth once a week. Take with a full glass of water on an empty stomach. 12 tablet 1  . aspirin EC 81 MG EC tablet Take 1 tablet (81 mg total) by mouth daily. 60 tablet 0  . atorvastatin (LIPITOR) 40 MG tablet TAKE 1 TABLET (40 MG TOTAL) BY MOUTH DAILY. 90 tablet 1  . busPIRone (BUSPAR) 15 MG tablet TAKE 1 TABLET (15 MG TOTAL) BY MOUTH EVERY 8 (EIGHT) HOURS AS NEEDED. 90 tablet 1  . Cholecalciferol (VITAMIN D) 2000 UNITS tablet Take 2,000 Units by mouth daily.      Marland Kitchen dexlansoprazole (DEXILANT) 60 MG capsule TAKE 1 CAPSULE (60 MG TOTAL) BY MOUTH DAILY. 90 capsule 1  . diltiazem (CARDIZEM CD) 180 MG 24 hr capsule TAKE 1 CAPSULE (180 MG TOTAL) BY MOUTH DAILY. 90 capsule 1  . Fluticasone-Salmeterol (ADVAIR DISKUS) 250-50 MCG/DOSE AEPB Inhale 1 puff into the lungs 2 (two) times daily. 60 each 5  . HYDROcodone-acetaminophen (NORCO/VICODIN) 5-325  MG per tablet Take 1 tablet by mouth 2 (two) times daily as needed for moderate pain. 60 tablet 0  . isosorbide mononitrate (IMDUR) 30 MG 24 hr tablet Take 1 tablet (30 mg total) by mouth daily. 30 tablet 1  . metoprolol (LOPRESSOR) 50 MG tablet Take 1 tablet (50 mg total) by mouth 2 (two) times daily. 180 tablet 1  . nitroGLYCERIN (NITROSTAT) 0.4 MG SL tablet Place 1 tablet (0.4 mg total) under the tongue every 5 (five) minutes as needed for chest pain. 25 tablet 6  . potassium chloride SA (KLOR-CON M20) 20 MEQ tablet Take 1 tablet (20 mEq total) by mouth daily. 90 tablet 1  . tiotropium (SPIRIVA HANDIHALER) 18 MCG inhalation capsule PLACE 1 CAPSULE (18 MCG TOTAL) INTO INHALER AND INHALE DAILY. 90 capsule 1  . torsemide (DEMADEX) 20 MG tablet Take 20 mg by mouth. Take one tablet by mouth if weight is 160 pounds or above    . warfarin (COUMADIN) 5 MG tablet Take 2.5-5 mg by mouth See admin instructions. Takes 1 tablet (5mg ) every Tuesdays and Fridays and 1/2 tablet (2.5 mg) all other days.    . [DISCONTINUED] diltiazem (TIAZAC) 180 MG 24 hr capsule Take 1 capsule (180 mg total) by mouth daily. 90 capsule 1   No current facility-administered medications for this visit.    Past Medical History  Diagnosis Date  . Hypertension   . CAD (coronary artery disease) 5/96  . Edema   .  Hyperlipidemia   . Chronic atrial fibrillation     Coumadin therapy   . Osteopenia   . Renal insufficiency   . Closed right ankle fracture   . CHF (congestive heart failure)   . LBP (low back pain)   . Arthritis   . Cataract   . COPD (chronic obstructive pulmonary disease)     wears O2 at home as needed  . Esophageal stricture   . Esophageal dysmotility 09/2011    seen on barium esophagram.  . Carotid artery occlusion     Past Surgical History  Procedure Laterality Date  . Heart stent      X2  . Mole removed  2013    umbilicus  . Esophagogastroduodenoscopy  2012, 05/2013,09/2013    dilation of esophagus  and stricture in 09/2011, 05/2013.  meat disimpaction 09/2013  . Esophagogastroduodenoscopy N/A 10/18/2013    Procedure: ESOPHAGOGASTRODUODENOSCOPY (EGD);  Surgeon: Meryl Dare, MD;  Location: [facility] ENDOSCOPY;  Service: Endoscopy;  Laterality: N/A;  possible food impaction     ROS:  As stated in the HPI and negative for all other systems.   PHYSICAL EXAM BP 122/80 mmHg  Pulse 92  Ht 5' (1.524 m)  Wt 158 lb (71.668 kg)  BMI 30.86 kg/m2  LMP 03/27/1983 GENERAL:  Well appearing HEENT:  Pupils equal round and reactive, fundi not visualized, oral mucosa unremarkable NECK:  No jugular venous distention, waveform within normal limits, carotid upstroke brisk and symmetric, no bruits, no thyromegaly LYMPHATICS:  No cervical, inguinal adenopathy LUNGS:  Clear to auscultation bilaterally BACK:  No CVA tenderness, lordosis CHEST:  Unremarkable HEART:  PMI not displaced or sustained,S1 and S2 within normal limits, no S3, no clicks, no rubs, no murmurs, irregular ABD:  Flat, positive bowel sounds normal in frequency in pitch, no bruits, no rebound, no guarding, no midline pulsatile mass, no hepatomegaly, no splenomegaly EXT:  2 plus pulses throughout, no edema, no cyanosis no clubbing SKIN:  No rashes no nodules NEURO:  Cranial nerves II through XII grossly intact, motor grossly intact throughout PSYCH:  Cognitively intact, oriented to person place and time   ASSESSMENT AND PLAN  ATRIAL FIB:  The patient  tolerates this rhythm and rate control and anticoagulation. We will continue with the meds as listed.  [name] has a CHA2DS2 - VASc score of 5 with a risk of stroke of 6.7%   CAD:  She has no ongoing chest pain.  No further testing is indicated.  She wanted conservative management after this last hospitalization I think that's reasonable.  CKD;  Her creatinine was recently elevated. He was going up the time of discharge so she needs a repeat basic metabolic profile.  TOBACCO:  We  talked about this again today but she says since his almost daily she probably won't quit. In fact she asked me for a cigarette.    DYSLIPIDEMIA:  I will defer to Bennie Pierini, [name]  HTN:  The blood pressure is at target. No change in medications is indicated. We will continue with therapeutic lifestyle changes (TLC).  DYSPNEA:  She will continue with PRN O2.

## 2015-08-06 NOTE — Patient Instructions (Addendum)
Medication Instructions:  Your physician recommends that you continue on your current medications as directed. Please refer to the Current Medication list given to you today.  Lab: Please have BMP today.  Follow-Up: Follow up in 4 months with Dr Antoine Poche in [place].  Thank you for choosing [facility]!!

## 2015-08-07 LAB — BMP8+EGFR
BUN/Creatinine Ratio: 13 (ref 11–26)
BUN: 21 mg/dL (ref 8–27)
CO2: 31 mmol/L — ABNORMAL HIGH (ref 18–29)
Calcium: 9.8 mg/dL (ref 8.7–10.3)
Chloride: 98 mmol/L (ref 97–108)
Creatinine, Ser: 1.56 mg/dL — ABNORMAL HIGH (ref 0.57–1.00)
GFR calc Af Amer: 36 mL/min/{1.73_m2} — ABNORMAL LOW (ref >59)
GFR calc non Af Amer: 31 mL/min/{1.73_m2} — ABNORMAL LOW (ref >59)
Glucose: 113 mg/dL — ABNORMAL HIGH (ref 65–99)
Potassium: 4.9 mmol/L (ref 3.5–5.2)
Sodium: 140 mmol/L (ref 134–144)

## 2015-08-11 ENCOUNTER — Ambulatory Visit (INDEPENDENT_AMBULATORY_CARE_PROVIDER_SITE_OTHER): Payer: Medicare Other | Admitting: Family

## 2015-08-11 ENCOUNTER — Encounter: Payer: Self-pay | Admitting: Family

## 2015-08-11 VITALS — BP 153/91 | HR 64 | Temp 97.9°F | Ht 60.0 in | Wt 162.0 lb

## 2015-08-11 DIAGNOSIS — I482 Chronic atrial fibrillation, unspecified: Secondary | ICD-10-CM

## 2015-08-11 DIAGNOSIS — I2510 Atherosclerotic heart disease of native coronary artery without angina pectoris: Secondary | ICD-10-CM

## 2015-08-11 DIAGNOSIS — I10 Essential (primary) hypertension: Secondary | ICD-10-CM | POA: Diagnosis not present

## 2015-08-11 DIAGNOSIS — F411 Generalized anxiety disorder: Secondary | ICD-10-CM

## 2015-08-11 NOTE — Patient Instructions (Signed)
Chronic Obstructive Pulmonary Disease Chronic obstructive pulmonary disease (COPD) is a common lung condition in which airflow from the lungs is limited. COPD is a general term that can be used to describe many different lung problems that limit airflow, including both chronic bronchitis and emphysema. If you have COPD, your lung function will probably never return to normal, but there are measures you can take to improve lung function and make yourself feel better.  CAUSES   Smoking (common).   Exposure to secondhand smoke.   Genetic problems.  Chronic inflammatory lung diseases or recurrent infections. SYMPTOMS   Shortness of breath, especially with physical activity.   Deep, persistent (chronic) cough with a large amount of thick mucus.   Wheezing.   Rapid breaths (tachypnea).   Gray or bluish discoloration (cyanosis) of the skin, especially in fingers, toes, or lips.   Fatigue.   Weight loss.   Frequent infections or episodes when breathing symptoms become much worse (exacerbations).   Chest tightness. DIAGNOSIS  Your health care provider will take a medical history and perform a physical examination to make the initial diagnosis. Additional tests for COPD may include:   Lung (pulmonary) function tests.  Chest X-ray.  CT scan.  Blood tests. TREATMENT  Treatment available to help you feel better when you have COPD includes:   Inhaler and nebulizer medicines. These help manage the symptoms of COPD and make your breathing more comfortable.  Supplemental oxygen. Supplemental oxygen is only helpful if you have a low oxygen level in your blood.   Exercise and physical activity. These are beneficial for nearly all people with COPD. Some people may also benefit from a pulmonary rehabilitation program. HOME CARE INSTRUCTIONS   Take all medicines (inhaled or pills) as directed by your health care provider.  Avoid over-the-counter medicines or cough syrups  that dry up your airway (such as antihistamines) and slow down the elimination of secretions unless instructed otherwise by your health care provider.   If you are a smoker, the most important thing that you can do is stop smoking. Continuing to smoke will cause further lung damage and breathing trouble. Ask your health care provider for help with quitting smoking. He or she can direct you to community resources or hospitals that provide support.  Avoid exposure to irritants such as smoke, chemicals, and fumes that aggravate your breathing.  Use oxygen therapy and pulmonary rehabilitation if directed by your health care provider. If you require home oxygen therapy, ask your health care provider whether you should purchase a pulse oximeter to measure your oxygen level at home.   Avoid contact with individuals who have a contagious illness.  Avoid extreme temperature and humidity changes.  Eat healthy foods. Eating smaller, more frequent meals and resting before meals may help you maintain your strength.  Stay active, but balance activity with periods of rest. Exercise and physical activity will help you maintain your ability to do things you want to do.  Preventing infection and hospitalization is very important when you have COPD. Make sure to receive all the vaccines your health care provider recommends, especially the pneumococcal and influenza vaccines. Ask your health care provider whether you need a pneumonia vaccine.  Learn and use relaxation techniques to manage stress.  Learn and use controlled breathing techniques as directed by your health care provider. Controlled breathing techniques include:   Pursed lip breathing. Start by breathing in (inhaling) through your nose for 1 second. Then, purse your lips as if you were   going to whistle and breathe out (exhale) through the pursed lips for 2 seconds.   Diaphragmatic breathing. Start by putting one hand on your abdomen just above  your waist. Inhale slowly through your nose. The hand on your abdomen should move out. Then purse your lips and exhale slowly. You should be able to feel the hand on your abdomen moving in as you exhale.   Learn and use controlled coughing to clear mucus from your lungs. Controlled coughing is a series of short, progressive coughs. The steps of controlled coughing are:  1. Lean your head slightly forward.  2. Breathe in deeply using diaphragmatic breathing.  3. Try to hold your breath for 3 seconds.  4. Keep your mouth slightly open while coughing twice.  5. Spit any mucus out into a tissue.  6. Rest and repeat the steps once or twice as needed. SEEK MEDICAL CARE IF:   You are coughing up more mucus than usual.   There is a change in the color or thickness of your mucus.   Your breathing is more labored than usual.   Your breathing is faster than usual.  SEEK IMMEDIATE MEDICAL CARE IF:   You have shortness of breath while you are resting.   You have shortness of breath that prevents you from:  Being able to talk.   Performing your usual physical activities.   You have chest pain lasting longer than 5 minutes.   Your skin color is more cyanotic than usual.  You measure low oxygen saturations for longer than 5 minutes with a pulse oximeter. MAKE SURE YOU:   Understand these instructions.  Will watch your condition.  Will get help right away if you are not doing well or get worse. Document Released: 09/22/2005 Document Revised: 04/29/2014 Document Reviewed: 08/09/2013 [facility] Patient Information 2015 [place], [place]. This information is not intended to replace advice given to you by your health care provider. Make sure you discuss any questions you have with your health care provider. Hypertension Hypertension, commonly called high blood pressure, is when the force of blood pumping through your arteries is too strong. Your arteries are the blood vessels that  carry blood from your heart throughout your body. A blood pressure reading consists of a higher number over a lower number, such as 110/72. The higher number (systolic) is the pressure inside your arteries when your heart pumps. The lower number (diastolic) is the pressure inside your arteries when your heart relaxes. Ideally you want your blood pressure below 120/80. Hypertension forces your heart to work harder to pump blood. Your arteries may become narrow or stiff. Having hypertension puts you at risk for heart disease, stroke, and other problems.  RISK FACTORS Some risk factors for high blood pressure are controllable. Others are not.  Risk factors you cannot control include:   Race. You may be at higher risk if you are African American.  Age. Risk increases with age.  Gender. Men are at higher risk than women before age 49 years. After age 9, women are at higher risk than men. Risk factors you can control include:  Not getting enough exercise or physical activity.  Being overweight.  Getting too much fat, sugar, calories, or salt in your diet.  Drinking too much alcohol. SIGNS AND SYMPTOMS Hypertension does not usually cause signs or symptoms. Extremely high blood pressure (hypertensive crisis) may cause headache, anxiety, shortness of breath, and nosebleed. DIAGNOSIS  To check if you have hypertension, your health care provider will  measure your blood pressure while you are seated, with your arm held at the level of your heart. It should be measured at least twice using the same arm. Certain conditions can cause a difference in blood pressure between your right and left arms. A blood pressure reading that is higher than normal on one occasion does not mean that you need treatment. If one blood pressure reading is high, ask your health care provider about having it checked again. TREATMENT  Treating high blood pressure includes making lifestyle changes and possibly taking medicine.  Living a healthy lifestyle can help lower high blood pressure. You may need to change some of your habits. Lifestyle changes may include:  Following the DASH diet. This diet is high in fruits, vegetables, and whole grains. It is low in salt, red meat, and added sugars.  Getting at least 2 hours of brisk physical activity every week.  Losing weight if necessary.  Not smoking.  Limiting alcoholic beverages.  Learning ways to reduce stress. If lifestyle changes are not enough to get your blood pressure under control, your health care provider may prescribe medicine. You may need to take more than one. Work closely with your health care provider to understand the risks and benefits. HOME CARE INSTRUCTIONS  Have your blood pressure rechecked as directed by your health care provider.   Take medicines only as directed by your health care provider. Follow the directions carefully. Blood pressure medicines must be taken as prescribed. The medicine does not work as well when you skip doses. Skipping doses also puts you at risk for problems.   Do not smoke.   Monitor your blood pressure at home as directed by your health care provider. SEEK MEDICAL CARE IF:   You think you are having a reaction to medicines taken.  You have recurrent headaches or feel dizzy.  You have swelling in your ankles.  You have trouble with your vision. SEEK IMMEDIATE MEDICAL CARE IF: 7. You develop a severe headache or confusion. 8. You have unusual weakness, numbness, or feel faint. 9. You have severe chest or abdominal pain. 10. You vomit repeatedly. 11. You have trouble breathing. MAKE SURE YOU:   Understand these instructions.  Will watch your condition.  Will get help right away if you are not doing well or get worse. Document Released: 12/13/2005 Document Revised: 04/29/2014 Document Reviewed: 10/05/2013 [facility] Patient Information 2015 [place], [place]. This information is not intended to  replace advice given to you by your health care provider. Make sure you discuss any questions you have with your health care provider.

## 2015-08-11 NOTE — Progress Notes (Signed)
   Subjective:    Patient ID: [name], female    DOB: 06-28-1933, 81 y.o.   MRN: [id number]  Pt presents to the office today for elevated BP. Pt's daughter states she took her mom's BP is morning and it was 202/139 with her home wist monitor. Pt states she believes her monitor is "broken". Pt saw Dr. Percival Spanish, her cardiologist, five days ago and pt was given a "good report".  Hypertension This is a chronic problem. The current episode started more than 1 year ago. The problem has been waxing and waning since onset. The problem is uncontrolled. Associated symptoms include anxiety and shortness of breath. Pertinent negatives include no headaches, palpitations or peripheral edema. Risk factors for coronary artery disease include family history, obesity, post-menopausal state, smoking/tobacco exposure, sedentary lifestyle, stress and dyslipidemia. Past treatments include beta blockers and direct vasodilators. The current treatment provides mild improvement. Hypertensive end-organ damage includes CAD/MI and heart failure. There is no history of kidney disease, CVA or a thyroid problem. There is no history of sleep apnea.      Review of Systems  Constitutional: Negative.   HENT: Negative.   Eyes: Negative.   Respiratory: Positive for shortness of breath.   Cardiovascular: Negative.  Negative for palpitations.  Gastrointestinal: Negative.   Endocrine: Negative.   Genitourinary: Negative.   Musculoskeletal: Negative.   Neurological: Negative.  Negative for headaches.  Hematological: Negative.   Psychiatric/Behavioral: Negative.   All other systems reviewed and are negative.      Objective:   Physical Exam  Constitutional: She is oriented to person, place, and time. She appears well-developed and well-nourished. No distress.  HENT:  Head: Normocephalic and atraumatic.  Right Ear: External ear normal.  Left Ear: External ear normal.  Nose: Nose normal.  Mouth/Throat: Oropharynx is  clear and moist.  Eyes: Pupils are equal, round, and reactive to light.  Neck: Normal range of motion. Neck supple. No thyromegaly present.  Cardiovascular: Normal rate, regular rhythm, normal heart sounds and intact distal pulses.   No murmur heard. Pulmonary/Chest: Effort normal. No respiratory distress. She has wheezes.  Diminished breath sounds bilaterally in bases   Abdominal: Soft. Bowel sounds are normal. She exhibits no distension. There is no tenderness.  Musculoskeletal: Normal range of motion. She exhibits no edema or tenderness.  Discoloration of bilateral legs r/t PVD   Neurological: She is alert and oriented to person, place, and time. She has normal reflexes. No cranial nerve deficit.  Skin: Skin is warm and dry.  Psychiatric: She has a normal mood and affect. Her behavior is normal. Judgment and thought content normal.  Vitals reviewed.   BP 156/94 mmHg  Pulse 64  Temp(Src) 97.9 F (36.6 C) (Oral)  Ht 5' (1.524 m)  Wt 162 lb (73.483 kg)  BMI 31.64 kg/m2  LMP 03/27/1983  Pt's home BP monitor : 170/102 & 163/109     Assessment & Plan:  1. Essential hypertension, benign - BMP8+EGFR  2. Coronary artery disease involving native coronary artery of native heart without angina pectoris  3. GAD (generalized anxiety disorder)  Pt to keep all follow up appts with Shelah Lewandowsky [name] and Dr Percival Spanish (Cardiologists)  Smoking cessation discussed Pt to get new BP monitor at home -Dash diet information given -Exercise encouraged - Stress Management  -Continue current meds  Evelina Dun, [name]

## 2015-08-11 NOTE — Addendum Note (Signed)
Addended by: Prescott Gum on: 08/11/2015 03:34 PM   Modules accepted: Kipp Brood

## 2015-08-12 ENCOUNTER — Telehealth: Payer: Self-pay | Admitting: Nurse Practitioner

## 2015-08-12 LAB — BMP8+EGFR
BUN/Creatinine Ratio: 11 (ref 11–26)
BUN: 15 mg/dL (ref 8–27)
CO2: 25 mmol/L (ref 18–29)
Calcium: 9.5 mg/dL (ref 8.7–10.3)
Chloride: 102 mmol/L (ref 97–108)
Creatinine, Ser: 1.37 mg/dL — ABNORMAL HIGH (ref 0.57–1.00)
GFR calc Af Amer: 42 mL/min/{1.73_m2} — ABNORMAL LOW (ref >59)
GFR calc non Af Amer: 36 mL/min/{1.73_m2} — ABNORMAL LOW (ref >59)
Glucose: 99 mg/dL (ref 65–99)
Potassium: 5.4 mmol/L — ABNORMAL HIGH (ref 3.5–5.2)
Sodium: 143 mmol/L (ref 134–144)

## 2015-08-12 LAB — POCT INR: INR: 2.1

## 2015-08-12 NOTE — Telephone Encounter (Signed)
Patient states that she had protime yesterday with Neysa Bonito and she states that it was either 1.3 or 3.1she can not remember  and wants to know when she needs to come back.

## 2015-08-12 NOTE — Telephone Encounter (Signed)
Patient aware and appointment scheduled for 9/14 :15 with Paulene Floor, [name].

## 2015-08-12 NOTE — Telephone Encounter (Signed)
In 4 weeks

## 2015-08-12 NOTE — Addendum Note (Signed)
Addended by: Tommas Olp on: 08/12/2015 03:07 PM   Modules accepted: Orders

## 2015-08-12 NOTE — Telephone Encounter (Signed)
Pt's INR was 2.1. It was within normal limits.

## 2015-08-14 ENCOUNTER — Encounter (HOSPITAL_COMMUNITY): Payer: Self-pay

## 2015-08-14 ENCOUNTER — Ambulatory Visit (INDEPENDENT_AMBULATORY_CARE_PROVIDER_SITE_OTHER): Payer: Medicare Other | Admitting: Family Medicine

## 2015-08-14 ENCOUNTER — Emergency Department (HOSPITAL_COMMUNITY)
Admission: EM | Admit: 2015-08-14 | Discharge: 2015-08-14 | Disposition: A | Discharge status: Home / Self Care | Payer: Medicare Other | Attending: Emergency Medicine | Admitting: Emergency Medicine

## 2015-08-14 ENCOUNTER — Emergency Department (HOSPITAL_COMMUNITY): Payer: Medicare Other

## 2015-08-14 VITALS — BP 151/104 | HR 124 | Temp 97.2°F | Wt 160.4 lb

## 2015-08-14 DIAGNOSIS — Z8719 Personal history of other diseases of the digestive system: Secondary | ICD-10-CM | POA: Insufficient documentation

## 2015-08-14 DIAGNOSIS — Z8781 Personal history of (healed) traumatic fracture: Secondary | ICD-10-CM | POA: Diagnosis not present

## 2015-08-14 DIAGNOSIS — I4891 Unspecified atrial fibrillation: Secondary | ICD-10-CM | POA: Insufficient documentation

## 2015-08-14 DIAGNOSIS — I481 Persistent atrial fibrillation: Secondary | ICD-10-CM

## 2015-08-14 DIAGNOSIS — I10 Essential (primary) hypertension: Secondary | ICD-10-CM | POA: Insufficient documentation

## 2015-08-14 DIAGNOSIS — H269 Unspecified cataract: Secondary | ICD-10-CM | POA: Diagnosis not present

## 2015-08-14 DIAGNOSIS — M1990 Unspecified osteoarthritis, unspecified site: Secondary | ICD-10-CM | POA: Diagnosis not present

## 2015-08-14 DIAGNOSIS — Z720 Tobacco use: Secondary | ICD-10-CM | POA: Insufficient documentation

## 2015-08-14 DIAGNOSIS — E785 Hyperlipidemia, unspecified: Secondary | ICD-10-CM | POA: Diagnosis not present

## 2015-08-14 DIAGNOSIS — R0789 Other chest pain: Secondary | ICD-10-CM

## 2015-08-14 DIAGNOSIS — R079 Chest pain, unspecified: Secondary | ICD-10-CM | POA: Diagnosis not present

## 2015-08-14 DIAGNOSIS — I2510 Atherosclerotic heart disease of native coronary artery without angina pectoris: Secondary | ICD-10-CM

## 2015-08-14 DIAGNOSIS — J449 Chronic obstructive pulmonary disease, unspecified: Secondary | ICD-10-CM | POA: Diagnosis not present

## 2015-08-14 DIAGNOSIS — I509 Heart failure, unspecified: Secondary | ICD-10-CM | POA: Diagnosis not present

## 2015-08-14 DIAGNOSIS — I4819 Other persistent atrial fibrillation: Secondary | ICD-10-CM

## 2015-08-14 DIAGNOSIS — Z7982 Long term (current) use of aspirin: Secondary | ICD-10-CM | POA: Diagnosis not present

## 2015-08-14 DIAGNOSIS — Z7901 Long term (current) use of anticoagulants: Secondary | ICD-10-CM | POA: Diagnosis not present

## 2015-08-14 DIAGNOSIS — Z87448 Personal history of other diseases of urinary system: Secondary | ICD-10-CM | POA: Diagnosis not present

## 2015-08-14 DIAGNOSIS — Z79899 Other long term (current) drug therapy: Secondary | ICD-10-CM | POA: Diagnosis not present

## 2015-08-14 LAB — BASIC METABOLIC PANEL
Anion gap: 9 (ref 5–15)
BUN: 15 mg/dL (ref 6–20)
CO2: 25 mmol/L (ref 22–32)
Calcium: 9.2 mg/dL (ref 8.9–10.3)
Chloride: 108 mmol/L (ref 101–111)
Creatinine, Ser: 1.37 mg/dL — ABNORMAL HIGH (ref 0.44–1.00)
GFR calc Af Amer: 41 mL/min — ABNORMAL LOW (ref >60)
GFR calc non Af Amer: 35 mL/min — ABNORMAL LOW (ref >60)
Glucose, Bld: 95 mg/dL (ref 65–99)
Potassium: 4.8 mmol/L (ref 3.5–5.1)
Sodium: 142 mmol/L (ref 135–145)

## 2015-08-14 LAB — CBC WITH DIFFERENTIAL/PLATELET
Basophils Absolute: 0 10*3/uL (ref 0.0–0.1)
Basophils Relative: 0 % (ref 0–1)
Eosinophils Absolute: 0.1 10*3/uL (ref 0.0–0.7)
Eosinophils Relative: 1 % (ref 0–5)
HCT: 45.6 % (ref 36.0–46.0)
Hemoglobin: 14.8 g/dL (ref 12.0–15.0)
Lymphocytes Relative: 8 % — ABNORMAL LOW (ref 12–46)
Lymphs Abs: 0.8 10*3/uL (ref 0.7–4.0)
MCH: 31.1 pg (ref 26.0–34.0)
MCHC: 32.5 g/dL (ref 30.0–36.0)
MCV: 95.8 fL (ref 78.0–100.0)
Monocytes Absolute: 0.5 10*3/uL (ref 0.1–1.0)
Monocytes Relative: 6 % (ref 3–12)
Neutro Abs: 8.5 10*3/uL — ABNORMAL HIGH (ref 1.7–7.7)
Neutrophils Relative %: 85 % — ABNORMAL HIGH (ref 43–77)
Platelets: 211 10*3/uL (ref 150–400)
RBC: 4.76 MIL/uL (ref 3.87–5.11)
RDW: 12.8 % (ref 11.5–15.5)
WBC: 9.9 10*3/uL (ref 4.0–10.5)

## 2015-08-14 LAB — PROTIME-INR
INR: 2.37 — ABNORMAL HIGH (ref 0.00–1.49)
Prothrombin Time: 25.6 seconds — ABNORMAL HIGH (ref 11.6–15.2)

## 2015-08-14 LAB — MAGNESIUM: Magnesium: 1.9 mg/dL (ref 1.7–2.4)

## 2015-08-14 LAB — TROPONIN I
Troponin I: <0.03 ng/mL (ref <0.031)
Troponin I: <0.03 ng/mL (ref <0.031)

## 2015-08-14 MED ORDER — DILTIAZEM HCL 25 MG/5ML IV SOLN
15.0000 mg | Freq: Once | INTRAVENOUS | Status: AC
  Administered 2015-08-14: 15 mg via INTRAVENOUS
  Filled 2015-08-14: qty 5

## 2015-08-14 NOTE — ED Notes (Signed)
Per EMS - pt from [facility]. Hx afib - takes coumadin, cardizem, lopressor. Afib RVR on monitor. C/o chest pain this morning - given 2 nitro with relief. Pain 0/10 at this time. 20G Left Hand. BP 161/95, 134bpm (irreg), 96% 2L. Hx COPD, HTN, CAD.

## 2015-08-14 NOTE — ED Provider Notes (Signed)
CSN: [id number]     Arrival date & time 08/14/15  1050 History   First MD Initiated Contact with Patient 08/14/15 1051     Chief Complaint  Patient presents with  . Atrial Fibrillation     (Consider location/radiation/quality/duration/timing/severity/associated sxs/prior Treatment) HPI  81 year old female with chest pain. Onset shortly before 9:00 this morning while at rest. Describes a tightness across the front of her chest. Onset while walking from her bathroom in her home. Lasted approximately 15-20 minutes. She took 2 nitroglycerin and aspirin.  Associated with palpitations. Mild dyspnea. No nausea or diaphoresis. She went to her family physician and her pain resolved by the time she got there. She has been a symptomatic since then. Of note, patient was recently admitted for chest pain and ruled out enzymatically. At that time, she is not interested in pursuing stress testing and would not want to pursue any invasive procedures even if the stress test was positive. Readdressed this and her position has not changed.    Past Medical History  Diagnosis Date  . Hypertension   . CAD (coronary artery disease) 5/96  . Edema   . Hyperlipidemia   . Chronic atrial fibrillation     Coumadin therapy   . Osteopenia   . Renal insufficiency   . Closed right ankle fracture   . CHF (congestive heart failure)   . LBP (low back pain)   . Arthritis   . Cataract   . COPD (chronic obstructive pulmonary disease)     wears O2 at home as needed  . Esophageal stricture   . Esophageal dysmotility 09/2011    seen on barium esophagram.  . Carotid artery occlusion    Past Surgical History  Procedure Laterality Date  . Heart stent      X2  . Mole removed  2013    umbilicus  . Esophagogastroduodenoscopy  2012, 05/2013,09/2013    dilation of esophagus and stricture in 09/2011, 05/2013.  meat disimpaction 09/2013  . Esophagogastroduodenoscopy N/A 10/18/2013    Procedure: ESOPHAGOGASTRODUODENOSCOPY  (EGD);  Surgeon: Meryl Dare, MD;  Location[facility] ENDOSCOPY;  Service: Endoscopy;  Laterality: N/A;  possible food impaction   Family History  Problem Relation Age of Onset  . Colon cancer Neg Hx   . Esophageal cancer Neg Hx   . Stomach cancer Neg Hx   . Rectal cancer Neg Hx   . Hyperlipidemia Mother   . Hypertension Mother   . Hyperlipidemia Father   . Heart disease Father     After age 56  . Cancer Sister     Lung  . Hyperlipidemia Sister   . Hypertension Sister    Social History  Substance Use Topics  . Smoking status: Current Every Day Smoker -- 0.50 packs/day    Types: Cigarettes  . Smokeless tobacco: Never Used  . Alcohol Use: No     Comment: previous   OB History    No data available     Review of Systems  All systems reviewed and negative, other than as noted in HPI.   Allergies  Review of patient's allergies indicates no known allergies.  Home Medications   Prior to Admission medications   Medication Sig Start Date End Date Taking? Authorizing Provider  alendronate (FOSAMAX) 70 MG tablet Take 1 tablet (70 mg total) by mouth once a week. Take with a full glass of water on an empty stomach. 04/18/15  Yes Mary-Margaret Daphine Deutscher, [name]  aspirin EC 81 MG EC tablet Take 1  tablet (81 mg total) by mouth daily. 07/30/15  Yes Richarda Overlie, MD  atorvastatin (LIPITOR) 40 MG tablet TAKE 1 TABLET (40 MG TOTAL) BY MOUTH DAILY. 04/18/15  Yes Mary-Margaret Daphine Deutscher, [name]  busPIRone (BUSPAR) 15 MG tablet TAKE 1 TABLET (15 MG TOTAL) BY MOUTH EVERY 8 (EIGHT) HOURS AS NEEDED. 04/18/15  Yes Mary-Margaret Daphine Deutscher, [name]  Cholecalciferol (VITAMIN D) 2000 UNITS tablet Take 2,000 Units by mouth daily.     Yes Historical Provider, MD  dexlansoprazole (DEXILANT) 60 MG capsule TAKE 1 CAPSULE (60 MG TOTAL) BY MOUTH DAILY. 04/18/15  Yes Mary-Margaret Daphine Deutscher, [name]  diltiazem (CARDIZEM CD) 180 MG 24 hr capsule TAKE 1 CAPSULE (180 MG TOTAL) BY MOUTH DAILY. 04/18/15  Yes Mary-Margaret Daphine Deutscher, [name]   Fluticasone-Salmeterol (ADVAIR DISKUS) 250-50 MCG/DOSE AEPB Inhale 1 puff into the lungs 2 (two) times daily. 06/16/15  Yes Mary-Margaret Daphine Deutscher, [name]  isosorbide mononitrate (IMDUR) 30 MG 24 hr tablet Take 1 tablet (30 mg total) by mouth daily. 07/30/15  Yes Richarda Overlie, MD  metoprolol (LOPRESSOR) 50 MG tablet Take 1 tablet (50 mg total) by mouth 2 (two) times daily. 04/18/15  Yes Mary-Margaret Daphine Deutscher, [name]  potassium chloride SA (KLOR-CON M20) 20 MEQ tablet Take 1 tablet (20 mEq total) by mouth daily. 04/18/15  Yes Mary-Margaret Daphine Deutscher, [name]  tiotropium (SPIRIVA HANDIHALER) 18 MCG inhalation capsule PLACE 1 CAPSULE (18 MCG TOTAL) INTO INHALER AND INHALE DAILY. 06/16/15  Yes Mary-Margaret Daphine Deutscher, [name]  torsemide (DEMADEX) 20 MG tablet Take 20 mg by mouth. Take one tablet by mouth if weight is 160 pounds or above   Yes Historical Provider, MD  warfarin (COUMADIN) 5 MG tablet Take 2.5-5 mg by mouth See admin instructions. Takes 1 tablet (5mg ) every Tuesdays and Fridays and 1/2 tablet (2.5 mg) all other days.   Yes Historical Provider, MD  HYDROcodone-acetaminophen (NORCO/VICODIN) 5-325 MG per tablet Take 1 tablet by mouth 2 (two) times daily as needed for moderate pain. 07/21/15   Mary-Margaret Daphine Deutscher, [name]  nitroGLYCERIN (NITROSTAT) 0.4 MG SL tablet Place 1 tablet (0.4 mg total) under the tongue every 5 (five) minutes as needed for chest pain. 04/10/14   Rollene Rotunda, MD   BP 151/77 mmHg  Pulse 89  Temp(Src) 98 F (36.7 C) (Oral)  Resp 20  SpO2 96%  LMP 03/27/1983 Physical Exam  Constitutional: She appears well-developed and well-nourished. No distress.  HENT:  Head: Normocephalic and atraumatic.  Eyes: Conjunctivae are normal. Right eye exhibits no discharge. Left eye exhibits no discharge.  Neck: Neck supple.  Cardiovascular: Normal heart sounds.  Exam reveals no gallop and no friction rub.   No murmur heard. irreg irreg. mildly tachy.   Pulmonary/Chest: Effort normal and breath sounds normal. No  respiratory distress.  Abdominal: Soft. She exhibits no distension. There is no tenderness.  Musculoskeletal: She exhibits no edema or tenderness.  Lower extremities symmetric as compared to each other. No calf tenderness. Negative Homan's. No palpable cords.   Neurological: She is alert.  Skin: Skin is warm and dry.  Psychiatric: She has a normal mood and affect. Her behavior is normal. Thought content normal.  Nursing note and vitals reviewed.   ED Course  Procedures (including critical care time) Labs Review Labs Reviewed  CBC WITH DIFFERENTIAL/PLATELET - Abnormal; Notable for the following:    Neutrophils Relative % 85 (*)    Neutro Abs 8.5 (*)    Lymphocytes Relative 8 (*)    All other components within normal limits  BASIC METABOLIC PANEL - Abnormal; Notable  for the following:    Creatinine, Ser 1.37 (*)    GFR calc non Af Amer 35 (*)    GFR calc Af Amer 41 (*)    All other components within normal limits  PROTIME-INR - Abnormal; Notable for the following:    Prothrombin Time 25.6 (*)    INR 2.37 (*)    All other components within normal limits  MAGNESIUM  TROPONIN I  TROPONIN I    Imaging Review No results found. I have personally reviewed and evaluated these images and lab results as part of my medical decision-making.   EKG Interpretation   Date/Time:  Thursday August 14 2015 10:57:19 EDT Ventricular Rate:  123 PR Interval:    QRS Duration: 89 QT Interval:  297 QTC Calculation: 425 R Axis:   64 Text Interpretation:  Atrial fibrillation Borderline T wave abnormalities  similar to prior  Although rate has increased Confirmed by Juleen China  MD,  Lety Cullens ([id number]) on 08/14/2015 11:01:11 AM      MDM   Final diagnoses:  Atrial fibrillation with rapid ventricular response  Chest pain, unspecified chest pain type    81 year old female with chest pain. Some typical features. Presented in A. fib with RVR. She does have history of A. fib. Rate controlled with a dose  of Cardizem. She has been pain-free throughout her ER stay. Initial troponin is normal. Patient is agreeable to having repeat troponin. She like to be discharged possible. She has medical decision-making capability. Plan discharge if second troponin is normal. Will readdress if subsequent comes back elevated.    Raeford Razor, MD 08/20/15 [phone]

## 2015-08-14 NOTE — ED Notes (Signed)
Pt placed in gown and in bed. Pt monitored by pulse ox, bp cuff, and 12-lead. 

## 2015-08-14 NOTE — Progress Notes (Signed)
Patient ID: [name]   DOB: 06-15-1933, 81 y.o.   MRN: [id number]   HPI  Patient presents today for an acute visit for evaluation of chest pain.  Patient's explains that she had sudden onset chest pain which lasted several minutes this morning while she was up walking around. She describes it as a left-sided dull achy chest pain which radiated to her right side of her chest. She denies dyspnea but has had some increased heart rate throughout the morning. She's taking all of her medications this morning including aspirin. The chest pain is now resolved after 2 doses of nitroglycerin separated by 30 minutes  I have called cardiology, Dr. Graciela Husbands, and reviewed her EKG in chart with him. We agree that she'll probably benefit from emergent evaluation to rule out MI with a troponin as well as rate control for her A. Fib.  I recommended the patient that she traveled to the emergency room by ambulance given her risk of recurrent chest pain, which could easily be MI considering her history  PMH: Smoking status noted ROS: Per HPI  Objective: BP 151/104 mmHg  Pulse 124  Temp(Src) 97.2 F (36.2 C) (Oral)  Wt 160 lb 6.4 oz (72.757 kg)  SpO2 90%  LMP 03/27/1983 Gen: NAD, alert, cooperative with exam HEENT: NCAT, nasal cannula in place CV: Tachycardic, irregularly irregulart Resp: Nonlabored, on 2 L of oxygen via nasal cannula, crackles in the bases bilaterally Ext: Trace pitting edema bilateral lower extremities Neuro: Alert and oriented, No gross deficits  EKG today with irregularly irregular rhythm with heart rate of 144, nonspecific ST and T-wave changes. pulseox initially 87%, improved to 94% on 2L    Assessment and plan:  1 CAD 2 A. fib 3 diastolic congestive heart failure  81 year old female with CAD, A. fib with RVR, and stage III diastolic dysfunction here with chest pain and desaturation responding to oxygen by nasal cannula.  Considering her risk factors, labile  heart rate, and new oxygen requirement with crackles on exam I think it's most appropriate to send her to the ER to rule out MI and to try to control her rate a little bit better. She's not have systolic function but her crackles in the bases cause me to believe that she may be a little volume overloaded as well.   She took her aspirin this morning. She did not have any chest pain while she was here, this did resolve after 2 nitroglycerin's. We did not do ACS protocol but we discussed low threshold for ACS protocol with EMS should her chest pain return en route to the ED.    Orders Placed This Encounter  Procedures  . EKG 12-Lead    Murtis Sink, MD [facility] 08/14/2015, 10:06 AM

## 2015-08-14 NOTE — Discharge Instructions (Signed)

## 2015-08-14 NOTE — ED Notes (Signed)
Dr. Kohut at bedside with patient and family.   

## 2015-09-04 DIAGNOSIS — J449 Chronic obstructive pulmonary disease, unspecified: Secondary | ICD-10-CM | POA: Diagnosis not present

## 2015-09-10 ENCOUNTER — Encounter: Payer: Self-pay | Admitting: Nurse Practitioner

## 2015-09-10 ENCOUNTER — Ambulatory Visit (INDEPENDENT_AMBULATORY_CARE_PROVIDER_SITE_OTHER): Payer: Medicare Other | Admitting: Nurse Practitioner

## 2015-09-10 DIAGNOSIS — I482 Chronic atrial fibrillation, unspecified: Secondary | ICD-10-CM

## 2015-09-10 DIAGNOSIS — G8929 Other chronic pain: Secondary | ICD-10-CM

## 2015-09-10 DIAGNOSIS — M545 Low back pain, unspecified: Secondary | ICD-10-CM

## 2015-09-10 LAB — POCT INR: INR: 2.3

## 2015-09-10 MED ORDER — HYDROCODONE-ACETAMINOPHEN 5-325 MG PO TABS: 1.0000 | ORAL_TABLET | Freq: Two times a day (BID) | ORAL | Status: DC | PRN

## 2015-09-10 NOTE — Progress Notes (Signed)
Patient in for INR only today- see anticoag note

## 2015-09-10 NOTE — Patient Instructions (Signed)
Anticoagulation Dose Instructions as of 09/10/2015      [name] Tue Wed Thu Fri Sat   New Dose 2.5 mg 2.5 mg 5 mg 2.5 mg 2.5 mg 5 mg 2.5 mg    Description        1 tue and friablet daily for the next 3 days then back to normal dose of 0.5 tablet daily except whole tablet on Tuesday and friday

## 2015-09-10 NOTE — Addendum Note (Signed)
Addended by: Bennie Pierini on: 09/10/2015 11:15 AM   Modules accepted: Orders

## 2015-09-16 ENCOUNTER — Emergency Department (HOSPITAL_COMMUNITY): Payer: Medicare Other

## 2015-09-16 ENCOUNTER — Observation Stay (HOSPITAL_COMMUNITY)
Admission: EM | Admit: 2015-09-16 | Discharge: 2015-09-18 | Disposition: A | Discharge status: Home / Self Care | Payer: Medicare Other | Attending: Internal Medicine | Admitting: Internal Medicine

## 2015-09-16 ENCOUNTER — Ambulatory Visit (INDEPENDENT_AMBULATORY_CARE_PROVIDER_SITE_OTHER): Payer: Medicare Other | Admitting: Nurse Practitioner

## 2015-09-16 ENCOUNTER — Encounter: Payer: Self-pay | Admitting: Nurse Practitioner

## 2015-09-16 ENCOUNTER — Encounter (HOSPITAL_COMMUNITY): Payer: Self-pay | Admitting: Emergency Medicine

## 2015-09-16 ENCOUNTER — Encounter (INDEPENDENT_AMBULATORY_CARE_PROVIDER_SITE_OTHER): Payer: Self-pay

## 2015-09-16 VITALS — BP 144/88 | HR 89 | Temp 96.8°F | Ht 60.0 in | Wt 156.8 lb

## 2015-09-16 DIAGNOSIS — F411 Generalized anxiety disorder: Secondary | ICD-10-CM

## 2015-09-16 DIAGNOSIS — M545 Low back pain, unspecified: Secondary | ICD-10-CM | POA: Diagnosis present

## 2015-09-16 DIAGNOSIS — I481 Persistent atrial fibrillation: Secondary | ICD-10-CM

## 2015-09-16 DIAGNOSIS — E785 Hyperlipidemia, unspecified: Secondary | ICD-10-CM

## 2015-09-16 DIAGNOSIS — I482 Chronic atrial fibrillation: Secondary | ICD-10-CM | POA: Insufficient documentation

## 2015-09-16 DIAGNOSIS — J441 Chronic obstructive pulmonary disease with (acute) exacerbation: Secondary | ICD-10-CM | POA: Diagnosis not present

## 2015-09-16 DIAGNOSIS — R600 Localized edema: Secondary | ICD-10-CM

## 2015-09-16 DIAGNOSIS — E039 Hypothyroidism, unspecified: Secondary | ICD-10-CM

## 2015-09-16 DIAGNOSIS — M810 Age-related osteoporosis without current pathological fracture: Secondary | ICD-10-CM | POA: Diagnosis not present

## 2015-09-16 DIAGNOSIS — N179 Acute kidney failure, unspecified: Secondary | ICD-10-CM | POA: Diagnosis present

## 2015-09-16 DIAGNOSIS — G9340 Encephalopathy, unspecified: Principal | ICD-10-CM | POA: Insufficient documentation

## 2015-09-16 DIAGNOSIS — I2510 Atherosclerotic heart disease of native coronary artery without angina pectoris: Secondary | ICD-10-CM | POA: Diagnosis not present

## 2015-09-16 DIAGNOSIS — N184 Chronic kidney disease, stage 4 (severe): Secondary | ICD-10-CM | POA: Diagnosis not present

## 2015-09-16 DIAGNOSIS — J411 Mucopurulent chronic bronchitis: Secondary | ICD-10-CM

## 2015-09-16 DIAGNOSIS — R609 Edema, unspecified: Secondary | ICD-10-CM

## 2015-09-16 DIAGNOSIS — K219 Gastro-esophageal reflux disease without esophagitis: Secondary | ICD-10-CM | POA: Diagnosis not present

## 2015-09-16 DIAGNOSIS — I5032 Chronic diastolic (congestive) heart failure: Secondary | ICD-10-CM | POA: Diagnosis not present

## 2015-09-16 DIAGNOSIS — Z7982 Long term (current) use of aspirin: Secondary | ICD-10-CM | POA: Insufficient documentation

## 2015-09-16 DIAGNOSIS — G8929 Other chronic pain: Secondary | ICD-10-CM | POA: Diagnosis not present

## 2015-09-16 DIAGNOSIS — R4182 Altered mental status, unspecified: Secondary | ICD-10-CM

## 2015-09-16 DIAGNOSIS — I10 Essential (primary) hypertension: Secondary | ICD-10-CM

## 2015-09-16 DIAGNOSIS — J449 Chronic obstructive pulmonary disease, unspecified: Secondary | ICD-10-CM | POA: Diagnosis present

## 2015-09-16 DIAGNOSIS — I6522 Occlusion and stenosis of left carotid artery: Secondary | ICD-10-CM | POA: Insufficient documentation

## 2015-09-16 DIAGNOSIS — I129 Hypertensive chronic kidney disease with stage 1 through stage 4 chronic kidney disease, or unspecified chronic kidney disease: Secondary | ICD-10-CM | POA: Insufficient documentation

## 2015-09-16 DIAGNOSIS — I25709 Atherosclerosis of coronary artery bypass graft(s), unspecified, with unspecified angina pectoris: Secondary | ICD-10-CM

## 2015-09-16 DIAGNOSIS — I4819 Other persistent atrial fibrillation: Secondary | ICD-10-CM

## 2015-09-16 DIAGNOSIS — J4489 Other specified chronic obstructive pulmonary disease: Secondary | ICD-10-CM

## 2015-09-16 DIAGNOSIS — I4891 Unspecified atrial fibrillation: Secondary | ICD-10-CM | POA: Diagnosis present

## 2015-09-16 DIAGNOSIS — Z6830 Body mass index (BMI) 30.0-30.9, adult: Secondary | ICD-10-CM

## 2015-09-16 DIAGNOSIS — E876 Hypokalemia: Secondary | ICD-10-CM | POA: Diagnosis not present

## 2015-09-16 DIAGNOSIS — Z7901 Long term (current) use of anticoagulants: Secondary | ICD-10-CM | POA: Insufficient documentation

## 2015-09-16 DIAGNOSIS — N189 Chronic kidney disease, unspecified: Secondary | ICD-10-CM

## 2015-09-16 DIAGNOSIS — Z9981 Dependence on supplemental oxygen: Secondary | ICD-10-CM | POA: Insufficient documentation

## 2015-09-16 DIAGNOSIS — Z66 Do not resuscitate: Secondary | ICD-10-CM | POA: Insufficient documentation

## 2015-09-16 DIAGNOSIS — R410 Disorientation, unspecified: Secondary | ICD-10-CM | POA: Diagnosis not present

## 2015-09-16 DIAGNOSIS — J9611 Chronic respiratory failure with hypoxia: Secondary | ICD-10-CM | POA: Insufficient documentation

## 2015-09-16 DIAGNOSIS — R51 Headache: Secondary | ICD-10-CM | POA: Diagnosis not present

## 2015-09-16 DIAGNOSIS — R4781 Slurred speech: Secondary | ICD-10-CM | POA: Insufficient documentation

## 2015-09-16 DIAGNOSIS — F17210 Nicotine dependence, cigarettes, uncomplicated: Secondary | ICD-10-CM | POA: Insufficient documentation

## 2015-09-16 LAB — I-STAT TROPONIN, ED: Troponin i, poc: 0.02 ng/mL (ref 0.00–0.08)

## 2015-09-16 LAB — BASIC METABOLIC PANEL
Anion gap: 9 (ref 5–15)
BUN: 22 mg/dL — ABNORMAL HIGH (ref 6–20)
CO2: 26 mmol/L (ref 22–32)
Calcium: 9.1 mg/dL (ref 8.9–10.3)
Chloride: 104 mmol/L (ref 101–111)
Creatinine, Ser: 1.82 mg/dL — ABNORMAL HIGH (ref 0.44–1.00)
GFR calc Af Amer: 29 mL/min — ABNORMAL LOW (ref >60)
GFR calc non Af Amer: 25 mL/min — ABNORMAL LOW (ref >60)
Glucose, Bld: 124 mg/dL — ABNORMAL HIGH (ref 65–99)
Potassium: 4.7 mmol/L (ref 3.5–5.1)
Sodium: 139 mmol/L (ref 135–145)

## 2015-09-16 LAB — CBG MONITORING, ED: Glucose-Capillary: 120 mg/dL — ABNORMAL HIGH (ref 65–99)

## 2015-09-16 LAB — URINE MICROSCOPIC-ADD ON

## 2015-09-16 LAB — PROTIME-INR
INR: 2.37 — ABNORMAL HIGH (ref 0.00–1.49)
Prothrombin Time: 25.6 seconds — ABNORMAL HIGH (ref 11.6–15.2)

## 2015-09-16 LAB — CBC WITH DIFFERENTIAL/PLATELET
Basophils Absolute: 0 10*3/uL (ref 0.0–0.1)
Basophils Relative: 1 %
Eosinophils Absolute: 0.2 10*3/uL (ref 0.0–0.7)
Eosinophils Relative: 3 %
HCT: 46.2 % — ABNORMAL HIGH (ref 36.0–46.0)
Hemoglobin: 15.1 g/dL — ABNORMAL HIGH (ref 12.0–15.0)
Lymphocytes Relative: 13 %
Lymphs Abs: 1 10*3/uL (ref 0.7–4.0)
MCH: 30.9 pg (ref 26.0–34.0)
MCHC: 32.7 g/dL (ref 30.0–36.0)
MCV: 94.7 fL (ref 78.0–100.0)
Monocytes Absolute: 0.7 10*3/uL (ref 0.1–1.0)
Monocytes Relative: 8 %
Neutro Abs: 6.1 10*3/uL (ref 1.7–7.7)
Neutrophils Relative %: 75 %
Platelets: 216 10*3/uL (ref 150–400)
RBC: 4.88 MIL/uL (ref 3.87–5.11)
RDW: 13.3 % (ref 11.5–15.5)
WBC: 8.1 10*3/uL (ref 4.0–10.5)

## 2015-09-16 LAB — URINALYSIS, ROUTINE W REFLEX MICROSCOPIC
Bilirubin Urine: NEGATIVE
Glucose, UA: NEGATIVE mg/dL
Ketones, ur: NEGATIVE mg/dL
Nitrite: NEGATIVE
Protein, ur: NEGATIVE mg/dL
Specific Gravity, Urine: 1.009 (ref 1.005–1.030)
Urobilinogen, UA: 0.2 mg/dL (ref 0.0–1.0)
pH: 5 (ref 5.0–8.0)

## 2015-09-16 MED ORDER — DILTIAZEM HCL ER COATED BEADS 180 MG PO CP24
180.0000 mg | ORAL_CAPSULE | Freq: Every day | ORAL | Status: DC
  Administered 2015-09-17 – 2015-09-18 (×2): 180 mg via ORAL
  Filled 2015-09-16 (×2): qty 1

## 2015-09-16 MED ORDER — ACETAMINOPHEN 325 MG PO TABS: 650.0000 mg | ORAL_TABLET | ORAL | Status: DC | PRN

## 2015-09-16 MED ORDER — POTASSIUM CHLORIDE CRYS ER 20 MEQ PO TBCR: 20.0000 meq | EXTENDED_RELEASE_TABLET | Freq: Every day | ORAL | Status: DC

## 2015-09-16 MED ORDER — STROKE: EARLY STAGES OF RECOVERY BOOK
Freq: Once | Status: AC
  Administered 2015-09-16: 22:00:00
  Filled 2015-09-16: qty 1

## 2015-09-16 MED ORDER — BUSPIRONE HCL 15 MG PO TABS: ORAL_TABLET | ORAL | Status: DC

## 2015-09-16 MED ORDER — CITALOPRAM HYDROBROMIDE 20 MG PO TABS: 20.0000 mg | ORAL_TABLET | Freq: Every day | ORAL | Status: DC

## 2015-09-16 MED ORDER — IPRATROPIUM-ALBUTEROL 0.5-2.5 (3) MG/3ML IN SOLN
3.0000 mL | Freq: Four times a day (QID) | RESPIRATORY_TRACT | Status: DC
  Administered 2015-09-16 – 2015-09-17 (×2): 3 mL via RESPIRATORY_TRACT
  Filled 2015-09-16 (×2): qty 3

## 2015-09-16 MED ORDER — METOPROLOL TARTRATE 50 MG PO TABS
50.0000 mg | ORAL_TABLET | Freq: Two times a day (BID) | ORAL | Status: DC
  Administered 2015-09-17 – 2015-09-18 (×3): 50 mg via ORAL
  Filled 2015-09-16 (×3): qty 1

## 2015-09-16 MED ORDER — BUSPIRONE HCL 10 MG PO TABS: 15.0000 mg | ORAL_TABLET | Freq: Three times a day (TID) | ORAL | Status: DC | PRN

## 2015-09-16 MED ORDER — ASPIRIN EC 81 MG PO TBEC: 81.0000 mg | DELAYED_RELEASE_TABLET | Freq: Every day | ORAL | Status: DC

## 2015-09-16 MED ORDER — TORSEMIDE 20 MG PO TABS: 20.0000 mg | ORAL_TABLET | Freq: Once | ORAL | Status: DC

## 2015-09-16 MED ORDER — ATORVASTATIN CALCIUM 40 MG PO TABS: ORAL_TABLET | ORAL | Status: AC

## 2015-09-16 MED ORDER — ALENDRONATE SODIUM 70 MG PO TABS: 70.0000 mg | ORAL_TABLET | ORAL | Status: DC

## 2015-09-16 MED ORDER — PANTOPRAZOLE SODIUM 40 MG PO TBEC
40.0000 mg | DELAYED_RELEASE_TABLET | Freq: Every day | ORAL | Status: DC
  Administered 2015-09-17 – 2015-09-18 (×2): 40 mg via ORAL
  Filled 2015-09-16 (×2): qty 1

## 2015-09-16 MED ORDER — ATORVASTATIN CALCIUM 40 MG PO TABS
40.0000 mg | ORAL_TABLET | Freq: Every day | ORAL | Status: DC
  Administered 2015-09-17: 40 mg via ORAL
  Filled 2015-09-16: qty 1

## 2015-09-16 MED ORDER — PREDNISONE 50 MG PO TABS
50.0000 mg | ORAL_TABLET | Freq: Every day | ORAL | Status: DC
  Administered 2015-09-16: 50 mg via ORAL
  Filled 2015-09-16 (×2): qty 1

## 2015-09-16 MED ORDER — HYDROCODONE-ACETAMINOPHEN 5-325 MG PO TABS
1.0000 | ORAL_TABLET | Freq: Two times a day (BID) | ORAL | Status: DC | PRN
  Administered 2015-09-17: 1 via ORAL
  Filled 2015-09-16: qty 1

## 2015-09-16 MED ORDER — TIOTROPIUM BROMIDE MONOHYDRATE 18 MCG IN CAPS: ORAL_CAPSULE | RESPIRATORY_TRACT | Status: AC

## 2015-09-16 MED ORDER — ASPIRIN EC 81 MG PO TBEC
81.0000 mg | DELAYED_RELEASE_TABLET | Freq: Every day | ORAL | Status: DC
  Administered 2015-09-17: 81 mg via ORAL
  Filled 2015-09-16 (×2): qty 1

## 2015-09-16 MED ORDER — METOPROLOL TARTRATE 50 MG PO TABS: 50.0000 mg | ORAL_TABLET | Freq: Two times a day (BID) | ORAL | Status: DC

## 2015-09-16 MED ORDER — ACETAMINOPHEN 650 MG RE SUPP: 650.0000 mg | RECTAL | Status: DC | PRN

## 2015-09-16 MED ORDER — MOMETASONE FURO-FORMOTEROL FUM 100-5 MCG/ACT IN AERO
2.0000 | INHALATION_SPRAY | Freq: Two times a day (BID) | RESPIRATORY_TRACT | Status: DC
  Administered 2015-09-17 – 2015-09-18 (×2): 2 via RESPIRATORY_TRACT
  Filled 2015-09-16: qty 8.8

## 2015-09-16 MED ORDER — ACETAMINOPHEN 325 MG PO TABS
650.0000 mg | ORAL_TABLET | Freq: Once | ORAL | Status: AC
  Administered 2015-09-16: 650 mg via ORAL
  Filled 2015-09-16: qty 2

## 2015-09-16 MED ORDER — ISOSORBIDE MONONITRATE ER 30 MG PO TB24
30.0000 mg | ORAL_TABLET | Freq: Every day | ORAL | Status: DC
  Administered 2015-09-17 – 2015-09-18 (×2): 30 mg via ORAL
  Filled 2015-09-16 (×2): qty 1

## 2015-09-16 MED ORDER — ISOSORBIDE MONONITRATE ER 30 MG PO TB24: 30.0000 mg | ORAL_TABLET | Freq: Every day | ORAL | Status: DC

## 2015-09-16 MED ORDER — WARFARIN SODIUM 5 MG PO TABS: 2.5000 mg | ORAL_TABLET | ORAL | Status: AC

## 2015-09-16 MED ORDER — PANTOPRAZOLE SODIUM 40 MG PO TBEC: 40.0000 mg | DELAYED_RELEASE_TABLET | Freq: Every day | ORAL | Status: DC

## 2015-09-16 MED ORDER — DEXLANSOPRAZOLE 60 MG PO CPDR
DELAYED_RELEASE_CAPSULE | ORAL | Status: AC
Start: 2015-09-16 — End: ?

## 2015-09-16 MED ORDER — DILTIAZEM HCL ER COATED BEADS 180 MG PO CP24: ORAL_CAPSULE | ORAL | Status: DC

## 2015-09-16 NOTE — Addendum Note (Signed)
Addended by: Bennie Pierini on: 09/16/2015 10:36 AM   Modules accepted: Kipp Brood

## 2015-09-16 NOTE — ED Notes (Signed)
Per EMS:  Pt went to her doctor's appointment this morning and all was normal.  Approx 1420 pt began to have slurred speech and confusion.  EMS states some has resolved at this time.  EMS states EKG was unremarkable.  Pt has hx of afib and COPD.  Alert in room at this time,.

## 2015-09-16 NOTE — Patient Instructions (Signed)

## 2015-09-16 NOTE — ED Notes (Signed)
Hospitalist, Dr. Allena Katz, called to say he will come and evaluate pt for admission.

## 2015-09-16 NOTE — ED Provider Notes (Signed)
CSN: [id number]     Arrival date & time 09/16/15  1554 History   First MD Initiated Contact with Patient 09/16/15 1556     Chief Complaint  Patient presents with  . Altered Mental Status    HPI  [name] is an 81 y.o. female with a PMH of HTN, CAD, HLD, CHF, COPD, atrial fibrillation on coumadin who presents to the ED with altered mental status. Per report, the patient saw her PCP this morning, however around 14:20 she began to have slurred speech and confusion. The patient states she feels more confused than usual. She denies fever, chills, headache, lightheadedness, dizziness, loss of consciousness, chest pain, shortness of breath, abdominal pain, N/V/D/C, dysuria, urgency, frequency.   Family now present at bedside. They report the patient was more confused than usual on Saturday morning, but that this "tapered off." They state she called out from her yard today, and was noted to have difficulty speaking, at which time EMS was called. Family members also state she has seemed more short of breath lately.   Past Medical History  Diagnosis Date  . Hypertension   . CAD (coronary artery disease) 5/96  . Edema   . Hyperlipidemia   . Chronic atrial fibrillation     Coumadin therapy   . Osteopenia   . Renal insufficiency   . Closed right ankle fracture   . CHF (congestive heart failure)   . LBP (low back pain)   . Arthritis   . Cataract   . COPD (chronic obstructive pulmonary disease)     wears O2 at home as needed  . Esophageal stricture   . Esophageal dysmotility 09/2011    seen on barium esophagram.  . Carotid artery occlusion    Past Surgical History  Procedure Laterality Date  . Heart stent      X2  . Mole removed  2013    umbilicus  . Esophagogastroduodenoscopy  2012, 05/2013,09/2013    dilation of esophagus and stricture in 09/2011, 05/2013.  meat disimpaction 09/2013  . Esophagogastroduodenoscopy N/A 10/18/2013    Procedure: ESOPHAGOGASTRODUODENOSCOPY (EGD);   Surgeon: Meryl Dare, MD;  Location: [facility] ENDOSCOPY;  Service: Endoscopy;  Laterality: N/A;  possible food impaction   Family History  Problem Relation Age of Onset  . Colon cancer Neg Hx   . Esophageal cancer Neg Hx   . Stomach cancer Neg Hx   . Rectal cancer Neg Hx   . Hyperlipidemia Mother   . Hypertension Mother   . Hyperlipidemia Father   . Heart disease Father     After age 77  . Cancer Sister     Lung  . Hyperlipidemia Sister   . Hypertension Sister    Social History  Substance Use Topics  . Smoking status: Current Every Day Smoker -- 0.50 packs/day    Types: Cigarettes  . Smokeless tobacco: Never Used  . Alcohol Use: No     Comment: previous   OB History    No data available      Review of Systems  Constitutional: Negative for fever, chills, activity change, appetite change and fatigue.  Respiratory: Negative for shortness of breath.   Cardiovascular: Negative for chest pain.  Gastrointestinal: Negative for nausea, vomiting, abdominal pain, diarrhea and constipation.  Genitourinary: Negative for dysuria, urgency and frequency.  Neurological: Negative for dizziness, syncope, weakness, light-headedness, numbness and headaches.  Psychiatric/Behavioral: Positive for confusion.  All other systems reviewed and are negative.     Allergies  Review  of patient's allergies indicates no known allergies.  Home Medications   Prior to Admission medications   Medication Sig Start Date End Date Taking? Authorizing Provider  alendronate (FOSAMAX) 70 MG tablet Take 1 tablet (70 mg total) by mouth once a week. Take with a full glass of water on an empty stomach. 09/16/15   Mary-Margaret Daphine Deutscher, [name]  aspirin EC 81 MG EC tablet Take 1 tablet (81 mg total) by mouth daily. 07/30/15   Richarda Overlie, MD  atorvastatin (LIPITOR) 40 MG tablet TAKE 1 TABLET (40 MG TOTAL) BY MOUTH DAILY. 09/16/15   Mary-Margaret Daphine Deutscher, [name]  busPIRone (BUSPAR) 15 MG tablet TAKE 1 TABLET (15 MG TOTAL) BY  MOUTH EVERY 8 (EIGHT) HOURS AS NEEDED. 09/16/15   Mary-Margaret Daphine Deutscher, [name]  Cholecalciferol (VITAMIN D) 2000 UNITS tablet Take 2,000 Units by mouth daily.      Historical Provider, MD  citalopram (CELEXA) 20 MG tablet Take 1 tablet (20 mg total) by mouth daily. 09/16/15   Mary-Margaret Daphine Deutscher, [name]  dexlansoprazole (DEXILANT) 60 MG capsule TAKE 1 CAPSULE (60 MG TOTAL) BY MOUTH DAILY. 09/16/15   Mary-Margaret Daphine Deutscher, [name]  diltiazem (CARDIZEM CD) 180 MG 24 hr capsule TAKE 1 CAPSULE (180 MG TOTAL) BY MOUTH DAILY. 09/16/15   Mary-Margaret Daphine Deutscher, [name]  Fluticasone-Salmeterol (ADVAIR DISKUS) 250-50 MCG/DOSE AEPB Inhale 1 puff into the lungs 2 (two) times daily. 06/16/15   Mary-Margaret Daphine Deutscher, [name]  HYDROcodone-acetaminophen (NORCO/VICODIN) 5-325 MG per tablet Take 1 tablet by mouth 2 (two) times daily as needed for moderate pain. 09/10/15   Mary-Margaret Daphine Deutscher, [name]  isosorbide mononitrate (IMDUR) 30 MG 24 hr tablet Take 1 tablet (30 mg total) by mouth daily. 09/16/15   Mary-Margaret Daphine Deutscher, [name]  metoprolol (LOPRESSOR) 50 MG tablet Take 1 tablet (50 mg total) by mouth 2 (two) times daily. 09/16/15   Mary-Margaret Daphine Deutscher, [name]  nitroGLYCERIN (NITROSTAT) 0.4 MG SL tablet Place 1 tablet (0.4 mg total) under the tongue every 5 (five) minutes as needed for chest pain. 04/10/14   Rollene Rotunda, MD  potassium chloride SA (KLOR-CON M20) 20 MEQ tablet Take 1 tablet (20 mEq total) by mouth daily. 09/16/15   Mary-Margaret Daphine Deutscher, [name]  tiotropium (SPIRIVA HANDIHALER) 18 MCG inhalation capsule PLACE 1 CAPSULE (18 MCG TOTAL) INTO INHALER AND INHALE DAILY. 09/16/15   Mary-Margaret Daphine Deutscher, [name]  torsemide (DEMADEX) 20 MG tablet Take 1 tablet (20 mg total) by mouth once. Take one tablet by mouth if weight is 160 pounds or above 09/16/15   Mary-Margaret Daphine Deutscher, [name]  warfarin (COUMADIN) 5 MG tablet Take 0.5-1 tablets (2.5-5 mg total) by mouth See admin instructions. Takes 1 tablet ( ) every Tuesdays and Fridays and 1/2 tablet (2.5 mg) all  other days. 09/16/15   Mary-Margaret Daphine Deutscher, [name]    BP 176/90 mmHg  Pulse 93  Temp(Src) 98 F (36.7 C) (Oral)  Resp 14  Ht  (1.651 m)  Wt 160 lb (72.576 kg)  BMI 26.63 kg/m2  SpO2 97%  LMP 03/27/1983 Physical Exam  Constitutional: No distress.  Chronically ill appearing female in no acute distress.  HENT:  Head: Normocephalic and atraumatic.  Right Ear: External ear normal.  Left Ear: External ear normal.  Nose: Nose normal.  Mouth/Throat: Uvula is midline, oropharynx is clear and moist and mucous membranes are normal.  Eyes: Conjunctivae, EOM and lids are normal. Pupils are equal, round, and reactive to light. Right eye exhibits no discharge. Left eye exhibits no discharge. No scleral icterus.  Neck: Normal range of motion. Neck supple.  Cardiovascular: Normal rate, intact distal pulses and normal pulses.  An irregularly irregular rhythm present.  Pulmonary/Chest: Effort normal and breath sounds normal. No respiratory distress. She has no wheezes. She has no rales.  Abdominal: Soft. Normal appearance and bowel sounds are normal. She exhibits no distension and no mass. There is no tenderness. There is no rigidity, no rebound and no guarding.  Musculoskeletal: Normal range of motion. She exhibits no edema or tenderness.  Neurological: She is alert. No cranial nerve deficit or sensory deficit.  Oriented to person. Patient holds right arm up, but does not follow commands on testing grip strength. Strength 5/5 in left upper extremity. Strength 5/5 in lower extremities bilaterally. No facial asymmetry. Patient with fluent speech.  Skin: Skin is warm, dry and intact. No rash noted. She is not diaphoretic. No erythema. No pallor.  Psychiatric: She has a normal mood and affect. Her speech is normal and behavior is normal. Judgment and thought content normal.  Nursing note and vitals reviewed.   ED Course  Procedures (including critical care time)  Labs Review Labs Reviewed  CBC  WITH DIFFERENTIAL/PLATELET - Abnormal; Notable for the following:    Hemoglobin 15.1 (*)    HCT 46.2 (*)    All other components within normal limits  BASIC METABOLIC PANEL - Abnormal; Notable for the following:    Glucose, Bld 124 (*)    BUN 22 (*)    Creatinine, Ser 1.82 (*)    GFR calc non Af Amer 25 (*)    GFR calc Af Amer 29 (*)    All other components within normal limits  URINALYSIS, ROUTINE W REFLEX MICROSCOPIC (NOT AT [facility]) - Abnormal; Notable for the following:    Hgb urine dipstick MODERATE (*)    Leukocytes, UA TRACE (*)    All other components within normal limits  PROTIME-INR - Abnormal; Notable for the following:    Prothrombin Time 25.6 (*)    INR 2.37 (*)    All other components within normal limits  URINE MICROSCOPIC-ADD ON - Abnormal; Notable for the following:    Squamous Epithelial / LPF FEW (*)    Bacteria, UA FEW (*)    All other components within normal limits  CBG MONITORING, ED - Abnormal; Notable for the following:    Glucose-Capillary 120 (*)    All other components within normal limits  I-STAT TROPOININ, ED    Imaging Review Dg Chest 2 View  09/16/2015   CLINICAL DATA:  Confusion, altered mental status  EXAM: CHEST  2 VIEW  COMPARISON:  08/14/2015  FINDINGS: Marked cardiomegaly with mild vascular congestion. No current edema or effusion. Lungs remain clear. No focal pneumonia, collapse or consolidation. Aorta is atherosclerotic and tortuous as before. Degenerative changes of the spine. Bones are osteopenic. No effusion or pneumothorax. No significant interval change.  IMPRESSION: Cardiomegaly with vascular congestion  No superimposed acute process  Aortic atherosclerosis and tortuosity   Electronically Signed   By: Judie Petit.  Shick M.D.   On: 09/16/2015 17:14   Ct Head Wo Contrast  09/16/2015   CLINICAL DATA:  Frontal headache. Slurred speech and confusion started around 2:20 p.m. History of atrial fibrillation and COPD.  EXAM: CT HEAD WITHOUT CONTRAST   TECHNIQUE: Contiguous axial images were obtained from the base of the skull through the vertex without intravenous contrast.  COMPARISON:  None.  FINDINGS: There is mild central and cortical atrophy. Periventricular white matter changes are consistent with small vessel disease. There is encephalomalacia in the  left occipital lobe consistent with old infarct. Focal ischemic changes identified in the right parietal lobe and left frontal lobe. An old lacunar infarct is identified within the right putamen. No associated hemorrhage. No evidence for acute infarction.  Bone windows are unremarkable. Note is made of atherosclerotic calcification of the internal carotid arteries.  IMPRESSION: 1. Significant atrophy and small vessel disease. 2. 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 3.  No evidence for acute intracranial abnormality.    Globe   Electronically Signed   By: Norva Pavlov M.D.   On: 09/16/2015 18:53     I have personally reviewed and evaluated these images and lab results as part of my medical decision-making.   EKG Interpretation   Date/Time:  Tuesday September 16 2015 16:33:42 EDT Ventricular Rate:  79 PR Interval:    QRS Duration: 100 QT Interval:  378 QTC Calculation: 433 R Axis:   58 Text Interpretation:  Atrial fibrillation Nonspecific T abnormalities,  inferior leads no significant change since Aug 2016 Confirmed by Criss Alvine   MD, SCOTT [phone]) on 09/16/2015 4:35:17 PM      MDM   Final diagnoses:  Altered mental status   81 year old female presents with altered mental status, and states she seems more confused than usual. Family adds she was confused on Saturday morning, which resolved. They state she called from her yard earlier today, and was noted to have difficulty with speech. Denies fever, chills, headache, lightheadedness, dizziness, loss of consciousness, chest pain, shortness of breath,  abdominal pain, N/V/D/C, dysuria, urgency, frequency.   Patient is afebrile. Vital signs stable. She is alert and oriented to person. Cranial nerves II-XII grossly intact. Strength and sensation intact. Patient holds right upper extremity in the air spontaneously, but does not comply with testing of grip strength. Strength 5/5 in left upper extremities and bilateral lower extremities. Heart irregularly irregular. Lungs clear to auscultation bilaterally. Abdomen soft, nontender, nondistended. No lower extremity edema.  CBC negative for leukocytosis. BMP with creatinine at 1.82, which appears elevated from baseline. UA with moderate hemoglobin and trace leukocytes. EKG no acute ischemia. Troponin negative x 1. Chest x-ray demonstrates cardiomegaly with vascular congestion, no acute process. CT head obtained, which reveals significant atrophy and small vessel disease, no evidence for acute intracranial abnormality.  Patient to be admitted. Hospitalist consulted. Spoke with Dr. Allena Katz, who will see the patient in the ED and admit for further evaluation and management.  BP 176/90 mmHg  Pulse 93  Temp(Src) 98 F (36.7 C) (Oral)  Resp 14  Ht  (1.651 m)  Wt 160 lb (72.576 kg)  BMI 26.63 kg/m2  SpO2 97%  LMP 03/27/1983        Mady Gemma, PA-C 09/17/15 [address] Wineglass, PA-C 09/17/15 [id number]  Pricilla Loveless, MD 09/19/15 [phone]

## 2015-09-16 NOTE — Progress Notes (Signed)
Pt arrived to unit alert and oriented. Oriented to room. Call bell at side. Bed alarm activated. Questions answered. Will continue to monitor. Hayles, Imani M, RN 

## 2015-09-16 NOTE — Progress Notes (Signed)
Subjective:    Patient ID: [name], female    DOB: September 24, 1933, 81 y.o.   MRN: [id number]  Patient here today for follow up of chronic medical problems. States there are no changes since last visit and has no complaints today.   Hypertension This is a chronic problem. The current episode started more than 1 year ago. The problem is unchanged. The problem is controlled. Pertinent negatives include no headaches. Risk factors for coronary artery disease include dyslipidemia, obesity and post-menopausal state. Past treatments include beta blockers and angiotensin blockers. The current treatment provides moderate improvement. Compliance problems include diet and exercise.   Hyperlipidemia This is a chronic problem. The problem is controlled. Recent lipid tests were reviewed and are normal. She has no history of diabetes, hypothyroidism or obesity. Current antihyperlipidemic treatment includes statins. The current treatment provides moderate improvement of lipids. Compliance problems include adherence to exercise and adherence to diet.  Risk factors for coronary artery disease include dyslipidemia, hypertension and post-menopausal.  Gastrophageal Reflux She reports no coughing or no nausea. This is a chronic problem. The problem occurs occasionally. The problem has been unchanged. Nothing aggravates the symptoms. Risk factors include obesity and lack of exercise. She has tried a histamine-2 antagonist for the symptoms. The treatment provided moderate relief.  COPD Well controlled on spirivia and advair. Has not needed albuterol rescue inhaler. Hypokalemia K-dur daily no c/o lower extremity cramping Atrial Fib Well controlled on diltiazem- also on coumadin without any bleeding. Has appt with cardiologist next week.   Chronic back  Pain/osteoporosis She got  A depoprovera shot at last visit which really helped her back pain- would like another one.   Review of Systems  Constitutional: Negative.    HENT: Negative.   Respiratory: Negative for cough.   Gastrointestinal: Negative for nausea.  Genitourinary: Negative.   Musculoskeletal: Negative.   Neurological: Negative.  Negative for headaches.  Psychiatric/Behavioral: Negative.   All other systems reviewed and are negative.      Objective:   Physical Exam  Constitutional: She is oriented to person, place, and time. She appears well-developed and well-nourished.  HENT:  Nose: Nose normal.  Mouth/Throat: Oropharynx is clear and moist.  Cerumen bil ears.   Eyes: EOM are normal.  Neck: Trachea normal, normal range of motion and full passive range of motion without pain. Neck supple. No JVD present. Carotid bruit is not present. No thyromegaly present.  Cardiovascular: Normal rate, regular rhythm, normal heart sounds and intact distal pulses.  Exam reveals no gallop and no friction rub.   No murmur heard. Pulmonary/Chest: Effort normal and breath sounds normal.  Abdominal: Soft. Bowel sounds are normal. She exhibits no distension and no mass. There is no tenderness.  Musculoskeletal: Normal range of motion.  Foot cyanosis- mild- slow cap refill- palpable pedal pulse bil- warm to touch.  Lymphadenopathy:    She has no cervical adenopathy.  Neurological: She is alert and oriented to person, place, and time. She has normal reflexes.  Skin: Skin is warm and dry.  Psychiatric: She has a normal mood and affect. Her behavior is normal. Judgment and thought content normal.    BP 144/88 mmHg  Pulse 89  Temp(Src) 96.8 F (36 C) (Oral)  Ht 5' (1.524 m)  Wt 156 lb 12.8 oz (71.124 kg)  BMI 30.62 kg/m2  LMP 03/27/1983        Assessment & Plan:   1. Essential hypertension, benign Do not add salt to diet - metoprolol (LOPRESSOR) 50  MG tablet; Take 1 tablet (50 mg total) by mouth 2 (two) times daily.  Dispense: 180 tablet; Refill: 1 - CMP14+EGFR  2. Coronary artery disease involving native coronary artery of native heart  without angina pectoris - isosorbide mononitrate (IMDUR) 30 MG 24 hr tablet; Take 1 tablet (30 mg total) by mouth daily.  Dispense: 90 tablet; Refill: 1  3. Persistent atrial fibrillation  - warfarin (COUMADIN) 5 MG tablet; Take 0.5-1 tablets (2.5-5 mg total) by mouth See admin instructions. Takes 1 tablet (45m) every Tuesdays and Fridays and 1/2 tablet (2.5 mg) all other days.  Dispense: 60 tablet; Refill: 5  4. COPD (chronic obstructive pulmonary disease) with chronic bronchitis Stop smoking  5. Gastroesophageal reflux disease without esophagitis Avoid spicy foods Do not eat 2 hours prior to bedtime - dexlansoprazole (DEXILANT) 60 MG capsule; TAKE 1 CAPSULE (60 MG TOTAL) BY MOUTH DAILY.  Dispense: 90 capsule; Refill: 1  6. Hypothyroidism, unspecified hypothyroidism type  7. Hyperlipidemia with target LDL less than 100 Low fat diet - atorvastatin (LIPITOR) 40 MG tablet; TAKE 1 TABLET (40 MG TOTAL) BY MOUTH DAILY.  Dispense: 90 tablet; Refill: 1 - Lipid panel  8. GAD (generalized anxiety disorder) Stress management Added celexa to meds today - citalopram (CELEXA) 20 MG tablet; Take 1 tablet (20 mg total) by mouth daily.  Dispense: 30 tablet; Refill: 5 - busPIRone (BUSPAR) 15 MG tablet; TAKE 1 TABLET (15 MG TOTAL) BY MOUTH EVERY 8 (EIGHT) HOURS AS NEEDED.  Dispense: 90 tablet; Refill: 1  9. Osteoporosis Weight bearing exercises - alendronate (FOSAMAX) 70 MG tablet; Take 1 tablet (70 mg total) by mouth once a week. Take with a full glass of water on an empty stomach.  Dispense: 12 tablet; Refill: 1  10. BMI 30.0-30.9,adult Discussed diet and exercise for person with BMI >25 Will recheck weight in 3-6 months   11. Mucopurulent chronic bronchitis - tiotropium (SPIRIVA HANDIHALER) 18 MCG inhalation capsule; PLACE 1 CAPSULE (18 MCG TOTAL) INTO INHALER AND INHALE DAILY.  Dispense: 90 capsule; Refill: 1  12. Hypokalemia - potassium chloride SA (KLOR-CON M20) 20 MEQ tablet; Take 1  tablet (20 mEq total) by mouth daily.  Dispense: 90 tablet; Refill: 1  13. Coronary artery disease involving coronary bypass graft with unspecified angina pectoris - diltiazem (CARDIZEM CD) 180 MG 24 hr capsule; TAKE 1 CAPSULE (180 MG TOTAL) BY MOUTH DAILY.  Dispense: 90 capsule; Refill: 1  14. Peripheral edema elevate legs when sitting - torsemide (DEMADEX) 20 MG tablet; Take 1 tablet (20 mg total) by mouth once. Take one tablet by mouth if weight is 160 pounds or above  Dispense: 90 tablet; Refill: 1    Labs pending Health maintenance reviewed Diet and exercise encouraged Continue all meds Follow up  In 3 months   [place] [name]

## 2015-09-16 NOTE — ED Notes (Signed)
PA at bedside.

## 2015-09-17 ENCOUNTER — Observation Stay (HOSPITAL_COMMUNITY)
Admit: 2015-09-17 | Discharge: 2015-09-17 | Disposition: A | Discharge status: Home / Self Care | Payer: Medicare Other | Attending: Internal Medicine | Admitting: Internal Medicine

## 2015-09-17 ENCOUNTER — Observation Stay (HOSPITAL_COMMUNITY): Payer: Medicare Other

## 2015-09-17 DIAGNOSIS — J441 Chronic obstructive pulmonary disease with (acute) exacerbation: Secondary | ICD-10-CM

## 2015-09-17 DIAGNOSIS — E039 Hypothyroidism, unspecified: Secondary | ICD-10-CM

## 2015-09-17 DIAGNOSIS — R4182 Altered mental status, unspecified: Secondary | ICD-10-CM

## 2015-09-17 DIAGNOSIS — N179 Acute kidney failure, unspecified: Secondary | ICD-10-CM | POA: Diagnosis present

## 2015-09-17 DIAGNOSIS — M545 Low back pain: Secondary | ICD-10-CM | POA: Diagnosis not present

## 2015-09-17 DIAGNOSIS — G9340 Encephalopathy, unspecified: Secondary | ICD-10-CM

## 2015-09-17 DIAGNOSIS — J449 Chronic obstructive pulmonary disease, unspecified: Secondary | ICD-10-CM | POA: Diagnosis present

## 2015-09-17 DIAGNOSIS — I10 Essential (primary) hypertension: Secondary | ICD-10-CM

## 2015-09-17 DIAGNOSIS — R410 Disorientation, unspecified: Secondary | ICD-10-CM | POA: Diagnosis not present

## 2015-09-17 DIAGNOSIS — I4891 Unspecified atrial fibrillation: Secondary | ICD-10-CM | POA: Diagnosis not present

## 2015-09-17 DIAGNOSIS — G8929 Other chronic pain: Secondary | ICD-10-CM

## 2015-09-17 DIAGNOSIS — N184 Chronic kidney disease, stage 4 (severe): Secondary | ICD-10-CM

## 2015-09-17 DIAGNOSIS — N183 Chronic kidney disease, stage 3 (moderate): Secondary | ICD-10-CM | POA: Diagnosis not present

## 2015-09-17 LAB — LIPID PANEL
Cholesterol: 147 mg/dL (ref 0–200)
HDL: 46 mg/dL (ref >40)
LDL Cholesterol: 83 mg/dL (ref 0–99)
Total CHOL/HDL Ratio: 3.2 RATIO
Triglycerides: 91 mg/dL (ref <150)
VLDL: 18 mg/dL (ref 0–40)

## 2015-09-17 LAB — CBC WITH DIFFERENTIAL/PLATELET
Basophils Absolute: 0 10*3/uL (ref 0.0–0.1)
Basophils Relative: 0 %
Eosinophils Absolute: 0 10*3/uL (ref 0.0–0.7)
Eosinophils Relative: 0 %
HCT: 48.9 % — ABNORMAL HIGH (ref 36.0–46.0)
Hemoglobin: 16.2 g/dL — ABNORMAL HIGH (ref 12.0–15.0)
Lymphocytes Relative: 4 %
Lymphs Abs: 0.3 10*3/uL — ABNORMAL LOW (ref 0.7–4.0)
MCH: 31 pg (ref 26.0–34.0)
MCHC: 33.1 g/dL (ref 30.0–36.0)
MCV: 93.7 fL (ref 78.0–100.0)
Monocytes Absolute: 0.1 10*3/uL (ref 0.1–1.0)
Monocytes Relative: 1 %
Neutro Abs: 6.1 10*3/uL (ref 1.7–7.7)
Neutrophils Relative %: 95 %
Platelets: 196 10*3/uL (ref 150–400)
RBC: 5.22 MIL/uL — ABNORMAL HIGH (ref 3.87–5.11)
RDW: 13.4 % (ref 11.5–15.5)
WBC: 6.4 10*3/uL (ref 4.0–10.5)

## 2015-09-17 LAB — COMPREHENSIVE METABOLIC PANEL
ALT: 8 U/L — ABNORMAL LOW (ref 14–54)
AST: 16 U/L (ref 15–41)
Albumin: 3.4 g/dL — ABNORMAL LOW (ref 3.5–5.0)
Alkaline Phosphatase: 45 U/L (ref 38–126)
Anion gap: 10 (ref 5–15)
BUN: 20 mg/dL (ref 6–20)
CO2: 24 mmol/L (ref 22–32)
Calcium: 8.7 mg/dL — ABNORMAL LOW (ref 8.9–10.3)
Chloride: 103 mmol/L (ref 101–111)
Creatinine, Ser: 1.57 mg/dL — ABNORMAL HIGH (ref 0.44–1.00)
GFR calc Af Amer: 35 mL/min — ABNORMAL LOW (ref >60)
GFR calc non Af Amer: 30 mL/min — ABNORMAL LOW (ref >60)
Glucose, Bld: 181 mg/dL — ABNORMAL HIGH (ref 65–99)
Potassium: 4 mmol/L (ref 3.5–5.1)
Sodium: 137 mmol/L (ref 135–145)
Total Bilirubin: 0.6 mg/dL (ref 0.3–1.2)
Total Protein: 6.2 g/dL — ABNORMAL LOW (ref 6.5–8.1)

## 2015-09-17 LAB — VITAMIN B12: Vitamin B-12: 393 pg/mL (ref 180–914)

## 2015-09-17 LAB — AMMONIA: Ammonia: 37 umol/L — ABNORMAL HIGH (ref 9–35)

## 2015-09-17 LAB — PROTIME-INR
INR: 1.97 — ABNORMAL HIGH (ref 0.00–1.49)
Prothrombin Time: 22.3 seconds — ABNORMAL HIGH (ref 11.6–15.2)

## 2015-09-17 LAB — TSH: TSH: 0.332 u[IU]/mL — ABNORMAL LOW (ref 0.350–4.500)

## 2015-09-17 LAB — FOLATE: Folate: 9.3 ng/mL (ref >5.9)

## 2015-09-17 MED ORDER — LORAZEPAM 2 MG/ML IJ SOLN
1.0000 mg | Freq: Once | INTRAMUSCULAR | Status: AC
  Administered 2015-09-17: 1 mg via INTRAVENOUS
  Filled 2015-09-17: qty 1

## 2015-09-17 MED ORDER — WARFARIN SODIUM 5 MG PO TABS
2.5000 mg | ORAL_TABLET | Freq: Once | ORAL | Status: AC
  Administered 2015-09-17: 2.5 mg via ORAL
  Filled 2015-09-17: qty 1

## 2015-09-17 MED ORDER — IPRATROPIUM-ALBUTEROL 0.5-2.5 (3) MG/3ML IN SOLN: 3.0000 mL | Freq: Four times a day (QID) | RESPIRATORY_TRACT | Status: DC | PRN

## 2015-09-17 MED ORDER — PREDNISONE 20 MG PO TABS
40.0000 mg | ORAL_TABLET | Freq: Every day | ORAL | Status: DC
  Administered 2015-09-18: 40 mg via ORAL
  Filled 2015-09-17: qty 2

## 2015-09-17 MED ORDER — WARFARIN - PHARMACIST DOSING INPATIENT: Freq: Every day | Status: DC

## 2015-09-17 MED ORDER — ENSURE ENLIVE PO LIQD
237.0000 mL | ORAL | Status: DC
  Filled 2015-09-17 (×2): qty 237

## 2015-09-17 NOTE — Evaluation (Signed)
Physical Therapy Evaluation Patient Details Name: AZUL COFFIE MRN: [id number] DOB: 1933-12-18 Today's Date: 09/17/2015   History of Present Illness  81 y.o. female admitted for Acute encephalopathy with mild COPD exacerbation.  Clinical Impression  Pt admitted with the above complications. Pt currently with functional limitations due to the deficits listed below (see PT Problem List). Demonstrates loss of balance upon standing. Typically uses a cane at baseline however required a rolling walker for increased support today and min assist for control with this device. Difficulty following some simple commands at times. SpO2 down to 90% on room air and dyspneic while working with PT, improves to upper 90s with 2L supplemental O2. Pt will benefit from skilled PT to increase their independence and safety with mobility to allow discharge to the venue listed below.       Follow Up Recommendations Home health PT;Supervision/Assistance - 24 hour (Initially - which pt reports can be provided by nephew/niece)    Equipment Recommendations  None recommended by PT    Recommendations for Other Services OT consult     Precautions / Restrictions Precautions Precautions: Fall Restrictions Weight Bearing Restrictions: No      Mobility  Bed Mobility Overal bed mobility: Needs Assistance Bed Mobility: Supine to Sit     Supine to sit: Supervision     General bed mobility comments: supervision for safety. Minimal use of rail. Cues for technique  Transfers Overall transfer level: Needs assistance Equipment used: Rolling walker (2 wheeled) Transfers: Sit to/from Stand Sit to Stand: Min guard         General transfer comment: Close guard for safety. Effortful for pt to stand. First attempt with loss of balance posteriorly and sat back on bed. VC for hand placement and anterior weight shift through UEs onto RW.  Ambulation/Gait Ambulation/Gait assistance: Min assist Ambulation Distance  (Feet): 65 Feet Assistive device: Rolling walker (2 wheeled);1 person hand held assist Gait Pattern/deviations: Step-through pattern;Decreased step length - left;Trunk flexed Gait velocity: slow Gait velocity interpretation: <1.8 ft/sec, indicative of risk for recurrent falls General Gait Details: Slow and guarded but reports this is her baseline. One episode of loss of balance to right but able to self correct by holding furniture in room. Otherwise min assist for hand held support when not using RW which she tolerated for approx 10 feet. Practiced stepping backwards with RW for support. Cues for proper placement of RW.  Stairs            Wheelchair Mobility    Modified Rankin (Stroke Patients Only) Modified Rankin (Stroke Patients Only) Pre-Morbid Rankin Score: Slight disability Modified Rankin: Moderately severe disability     Balance Overall balance assessment: Needs assistance Sitting-balance support: No upper extremity supported;Feet supported Sitting balance-Leahy Scale: Good     Standing balance support: Single extremity supported Standing balance-Leahy Scale: Poor                               Pertinent Vitals/Pain Pain Assessment: No/denies pain    Home Living Family/patient expects to be discharged to:: Private residence Living Arrangements: Alone Available Help at Discharge: Family;Available 24 hours/day ("as much as needed") Type of Home: House Home Access: Stairs to enter Entrance Stairs-Rails: None Entrance Stairs-Number of Steps: 2 Home Layout: One level Home Equipment: Walker - 2 wheels;Walker - 4 wheels;Cane - single point      Prior Function Level of Independence: Independent with assistive device(s)  Comments: Uses cane at baseline     Hand Dominance   Dominant Hand: Right    Extremity/Trunk Assessment   Upper Extremity Assessment: Defer to OT evaluation           Lower Extremity Assessment: Difficult to  assess due to impaired cognition (Difficulty following commands with LE testing)         Communication   Communication: No difficulties  Cognition Arousal/Alertness: Awake/alert Behavior During Therapy: WFL for tasks assessed/performed Overall Cognitive Status: No family/caregiver present to determine baseline cognitive functioning Area of Impairment: Following commands       Following Commands: Follows one step commands inconsistently            General Comments General comments (skin integrity, edema, etc.): States her nephew lives behind her house and can provide 24/7 care if needed. SpO2 down to 90% on room air, improves to upper 90s on 2L supplemental O2.    Exercises        Assessment/Plan    PT Assessment Patient needs continued PT services  PT Diagnosis Difficulty walking;Abnormality of gait;Altered mental status   PT Problem List Decreased activity tolerance;Decreased balance;Decreased mobility;Decreased cognition;Decreased knowledge of use of DME;Cardiopulmonary status limiting activity  PT Treatment Interventions DME instruction;Gait training;Stair training;Functional mobility training;Therapeutic activities;Therapeutic exercise;Neuromuscular re-education;Balance training;Cognitive remediation;Patient/family education   PT Goals (Current goals can be found in the Care Plan section) Acute Rehab PT Goals Patient Stated Goal: Go home PT Goal Formulation: With patient Time For Goal Achievement: 10/01/15 Potential to Achieve Goals: Good    Frequency Min 4X/week   Barriers to discharge Decreased caregiver support lives alone however states family can assist as needed    Co-evaluation               End of Session Equipment Utilized During Treatment: Gait belt Activity Tolerance: Patient tolerated treatment well Patient left: in chair;with call bell/phone within reach;with chair alarm set Nurse Communication: Mobility status    Functional Assessment  Tool Used: clinical observation Functional Limitation: Mobility: Walking and moving around Mobility: Walking and Moving Around Current Status [phone]): At least 20 percent but less than 40 percent impaired, limited or restricted Mobility: Walking and Moving Around Goal Status [phone]): At least 1 percent but less than 20 percent impaired, limited or restricted    Time: 7846-9629 PT Time Calculation (min) (ACUTE ONLY): 26 min   Charges:   PT Evaluation $Initial PT Evaluation Tier I: 1 Procedure PT Treatments $Gait Training: 8-22 mins   PT G Codes:   PT G-Codes **NOT FOR INPATIENT CLASS** Functional Assessment Tool Used: clinical observation Functional Limitation: Mobility: Walking and moving around Mobility: Walking and Moving Around Current Status ([id number]): At least 20 percent but less than 40 percent impaired, limited or restricted Mobility: Walking and Moving Around Goal Status [phone]): At least 1 percent but less than 20 percent impaired, limited or restricted    Berton Mount 09/17/2015, 6:16 PM [place], [place] [phone]

## 2015-09-17 NOTE — Progress Notes (Signed)
EEG completed; results pending.    

## 2015-09-17 NOTE — Progress Notes (Signed)
PT Cancellation Note  Patient Details Name: [name] MRN: [id number] DOB: 1933/01/24   Cancelled Treatment:    Reason Eval/Treat Not Completed: Patient at procedure or test/unavailable Will follow-up when patient returns to unit, as schedule allows.  Berton Mount 09/17/2015, 10:51 AM Charlsie Merles, PT [phone]

## 2015-09-17 NOTE — Progress Notes (Signed)
PATIENT DETAILS Name: [name] Age: 81 y.o. Sex: female Date of Birth: 05/02/1933 Admit Date: 09/16/2015 Admitting Physician Rolly Salter, MD ZOX:WRUEAV,WUJW MARGARET, [name]  Subjective: Mostly awake and alert this morning, answered most questions all my questions appropriately. Speech clear.  Assessment/Plan: Principal Problem: Acute encephalopathy: Etiology uncertain-? Polypharmacy related. Mental status seems to have  improved significantly. MRI brain negative for CVA, EEG negative. UA/chest x-ray negative for a UTI/pneumonia. Supportive care, follow clinical course.  Active Problems: Mild COPD exacerbation: Improved- Lungs clear this morning, begin prednisone taper. Continue with bronchodilators.  Chronic hypoxic respiratory failure: Continue oxygen. On home O2 prn.  Essential hypertension, benign: Moderately Controlled, continue Cardizem, metoprolol and Imdur. Follow and adjust accordingly.  Atrial fibrillation: Rate controlled with metoprolol, Coumadin per pharmacy-INR slightly subtherapeutic.CHA2DS2 - VASc score of 5  Chronic back pain: Stable, continue with as needed narcotics.? Contributing to polypharmacy-family does not think she uses a lot of narcotics. Follow.  GERD: Continue PPI  Anxiety: Continue with as needed BuSpar.  CAD (coronary artery disease), native coronary artery: Without chest pain or shortness of breath this morning. Appears stable. Continue aspirin, statin and beta blocker  Known history of left carotid artery occlusion: Chronic issue, and tinea with aspirin and statin.  CKD stage 3-4: Close to her usual baseline. Monitor periodically.  Disposition: Remain inpatient-await PT eval-suspect d/c on 9/22  Antimicrobial agents  See below  Anti-infectives    None      DVT Prophylaxis: Coumadin  Code Status: DNR  Family Communication Ms Shoemaker-Niece-over the phone  Procedures: None  CONSULTS:  None  Time  spent 30 minutes-Greater than 50% of this time was spent in counseling, explanation of diagnosis, planning of further management, and coordination of care.  MEDICATIONS: Scheduled Meds: . aspirin EC  81 mg Oral Daily  . atorvastatin  40 mg Oral q1800  . diltiazem  180 mg Oral Daily  . isosorbide mononitrate  30 mg Oral Daily  . metoprolol  50 mg Oral BID  . mometasone-formoterol  2 puff Inhalation BID  . pantoprazole  40 mg Oral Daily  . predniSONE  50 mg Oral Q breakfast  . warfarin  2.5 mg Oral ONCE-1800  . Warfarin - Pharmacist Dosing Inpatient   Does not apply q1800   Continuous Infusions:  PRN Meds:.acetaminophen **OR** acetaminophen, busPIRone, HYDROcodone-acetaminophen, ipratropium-albuterol    PHYSICAL EXAM: Vital signs in last 24 hours: Filed Vitals:   09/17/15 0645 09/17/15 0732 09/17/15 0844 09/17/15 1139  BP: 154/93  153/90 164/95  Pulse: 102  100 103  Temp: 98.4 F (36.9 C)     TempSrc: Oral     Resp: Height:      Weight:      SpO2: 97% 95%  99%    Weight change:  Filed Weights   09/16/15 1610  Weight: 72.576 kg (160 lb)   Body mass index is 26.63 kg/(m^2).   Gen Exam: Awake and alert with clear speech.  Neck: Supple, No JVD.   Chest: B/L Clear.   CVS: S1 S2 Regular, no murmurs.  Abdomen: soft, BS +, non tender, non distended.  Extremities: no edema, lower extremities warm to touch. Neurologic: Non Focal.   Skin: No Rash.   Wounds: N/A.   Intake/Output from previous day: No intake or output data in the 24 hours ending 09/17/15 1400   LAB RESULTS: CBC  Recent Labs  Lab 09/16/15 1616 09/17/15 0630  WBC 8.1 6.4  HGB 15.1* 16.2*  HCT 46.2* 48.9*  PLT 216 196  MCV 94.7 93.7  MCH 30.9 31.0  MCHC 32.7 33.1  RDW 13.3 13.4  LYMPHSABS 1.0 0.3*  MONOABS 0.7 0.1  EOSABS 0.2 0.0  BASOSABS 0.0 0.0    Chemistries   Recent Labs Lab 09/16/15 1616 09/17/15 0630  NA 139 137  K 4.7 4.0  CL 104 103  CO2 26 24  GLUCOSE 124* 181*   BUN 22* 20  CREATININE 1.82* 1.57*  CALCIUM 9.1 8.7*    CBG:  Recent Labs Lab 09/16/15 1628  GLUCAP 120*    GFR Estimated Creatinine Clearance: 28.5 mL/min (by C-G formula based on Cr of 1.57).  Coagulation profile  Recent Labs Lab 09/16/15 1616 09/17/15 0630  INR 2.37* 1.97*    Cardiac Enzymes No results for input(s): CKMB, TROPONINI, MYOGLOBIN in the last 168 hours.  Invalid input(s): CK  Invalid input(s): POCBNP No results for input(s): DDIMER in the last 72 hours. No results for input(s): HGBA1C in the last 72 hours.  Recent Labs  09/17/15 0630  CHOL 147  HDL 46  LDLCALC 83  TRIG 91  CHOLHDL 3.2    Recent Labs  09/17/15 0630  TSH 0.332*    Recent Labs  09/17/15 0630  VITAMINB12 393  FOLATE 9.3   No results for input(s): LIPASE, AMYLASE in the last 72 hours.  Urine Studies No results for input(s): UHGB, CRYS in the last 72 hours.  Invalid input(s): UACOL, UAPR, USPG, UPH, UTP, UGL, UKET, UBIL, UNIT, UROB, ULEU, UEPI, UWBC, URBC, UBAC, CAST, UCOM, BILUA  MICROBIOLOGY: No results found for this or any previous visit (from the past 240 hour(s)).  RADIOLOGY STUDIES/RESULTS: Dg Chest 2 View  09/16/2015   CLINICAL DATA:  Confusion, altered mental status  EXAM: CHEST  2 VIEW  COMPARISON:  08/14/2015  FINDINGS: Marked cardiomegaly with mild vascular congestion. No current edema or effusion. Lungs remain clear. No focal pneumonia, collapse or consolidation. Aorta is atherosclerotic and tortuous as before. Degenerative changes of the spine. Bones are osteopenic. No effusion or pneumothorax. No significant interval change.  IMPRESSION: Cardiomegaly with vascular congestion  No superimposed acute process  Aortic atherosclerosis and tortuosity   Electronically Signed   By: Judie Petit.  Shick M.D.   On: 09/16/2015 17:14   Ct Head Wo Contrast  09/16/2015   CLINICAL DATA:  Frontal headache. Slurred speech and confusion started around 2:20 p.m. History of atrial  fibrillation and COPD.  EXAM: CT HEAD WITHOUT CONTRAST  TECHNIQUE: Contiguous axial images were obtained from the base of the skull through the vertex without intravenous contrast.  COMPARISON:  None.  FINDINGS: There is mild central and cortical atrophy. Periventricular white matter changes are consistent with small vessel disease. There is encephalomalacia in the left occipital lobe consistent with old infarct. Focal ischemic changes identified in the right parietal lobe and left frontal lobe. An old lacunar infarct is identified within the right putamen. No associated hemorrhage. No evidence for acute infarction.  Bone windows are unremarkable. Note is made of atherosclerotic calcification of the internal carotid arteries.  IMPRESSION: 1. Significant atrophy and small vessel disease. 2. 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 3.  No evidence for acute intracranial abnormality.    Globe   Electronically Signed   By: Norva Pavlov M.D.   On: 09/16/2015 18:53   Mr  Brain Wo Contrast  09/17/2015   CLINICAL DATA:  Acute encephalopathy. Numbness and progressively worsening confusion. Slurred speech. Generalized weakness.  EXAM: MRI HEAD WITHOUT CONTRAST  TECHNIQUE: Multiplanar, multiecho pulse sequences of the brain and surrounding structures were obtained without intravenous contrast.  COMPARISON:  09/16/2015 head CT.  08/13/2008 carotid ultrasound.  FINDINGS: There is no evidence of acute infarct, intracranial hemorrhage, mass, midline shift, or extra-axial fluid collection. Chronic left frontal and left parieto-occipital cortical infarcts and a chronic right basal ganglia infarct are again noted. There is mild global cerebral atrophy.  Patchy T2 hyperintensities in the cerebral white matter bilaterally separate from the areas of chronic infarction are nonspecific but compatible with moderate chronic small vessel ischemic disease.  Mild chronic small vessel ischemic changes noted in the brainstem.  Orbits are unremarkable. Paranasal sinuses are clear. There is a trace left mastoid effusion. Abnormal appearance of the distal left internal carotid artery is consistent with previously demonstrated occlusion. Other major intracranial vascular flow voids are preserved.  IMPRESSION: 1. No acute intracranial abnormality. 2. Moderate chronic small vessel ischemic disease in the cerebral white matter. 3. 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 4. Chronically occluded left internal carotid artery.   Electronically Signed   By: Sebastian Ache M.D.   On: 09/17/2015 10:35    Jeoffrey Massed, MD  [facility] Pager:[phone]  If 7PM-7AM, please contact night-coverage [email or web address] Password [email or web address] 09/17/2015, 2:00 PM

## 2015-09-17 NOTE — Procedures (Signed)
ELECTROENCEPHALOGRAM REPORT   Patient: [name]        Age: 81 y.o.        Sex: female Referring Physician: Dr Jerral Ralph Report Date:  09/17/2015        Interpreting Physician: Omelia Blackwater  History: [name] is an 81 y.o. female admitted with AMS  Medications:  Scheduled: . aspirin EC  81 mg Oral Daily  . atorvastatin  40 mg Oral q1800  . diltiazem  180 mg Oral Daily  . feeding supplement (ENSURE ENLIVE)  237 mL Oral Q24H  . isosorbide mononitrate  30 mg Oral Daily  . metoprolol  50 mg Oral BID  . mometasone-formoterol  2 puff Inhalation BID  . pantoprazole  40 mg Oral Daily  . [START ON 09/18/2015] predniSONE  40 mg Oral Q breakfast  . warfarin  2.5 mg Oral ONCE-1800  . Warfarin - Pharmacist Dosing Inpatient   Does not apply q1800    Conditions of Recording:  This is a 16 channel EEG carried out with the patient in the drowsy state.  Description:  The waking background activity consists of a low voltage, symmetrical, fairly well organized, mix of delta and theta activity. No posterior dominant alpha rhythm is noted.    Hyperventilation was not performed. Intermittent photic stimulation was not performed.  IMPRESSION: Abnormal EEG due to the presence of generalized slowing indicating a moderate to severe cerebral disturbance (encephalopathy). No epileptiform activity noted.    Elspeth Cho, DO [facility] [phone]  If 7pm- 7am, please page neurology on call as listed in AMION. 09/17/2015, 5:42 PM

## 2015-09-17 NOTE — Progress Notes (Signed)
ANTICOAGULATION CONSULT NOTE - Initial Consult  Pharmacy Consult for Coumadin Indication: atrial fibrillation  No Known Allergies  Patient Measurements: Height:  (165.1 cm) Weight: 160 lb (72.576 kg) IBW/kg (Calculated) : 57  Vital Signs: Temp: 98.4 F (36.9 C) (09/21 0445) Temp Source: Oral (09/21 0445) BP: 181/89 mmHg (09/21 0445) Pulse Rate: 83 (09/21 0445)  Labs:  Recent Labs  09/16/15 1616  HGB 15.1*  HCT 46.2*  PLT 216  LABPROT 25.6*  INR 2.37*  CREATININE 1.82*    Estimated Creatinine Clearance: 24.6 mL/min (by C-G formula based on Cr of 1.82).   Medical History: Past Medical History  Diagnosis Date  . Hypertension   . CAD (coronary artery disease) 5/96  . Edema   . Hyperlipidemia   . Chronic atrial fibrillation     Coumadin therapy   . Osteopenia   . Renal insufficiency   . Closed right ankle fracture   . CHF (congestive heart failure)   . LBP (low back pain)   . Arthritis   . Cataract   . COPD (chronic obstructive pulmonary disease)     wears O2 at home as needed  . Esophageal stricture   . Esophageal dysmotility 09/2011    seen on barium esophagram.  . Carotid artery occlusion     Medications:  Prescriptions prior to admission  Medication Sig Dispense Refill Last Dose  . alendronate (FOSAMAX) 70 MG tablet Take 1 tablet (70 mg total) by mouth once a week. Take with a full glass of water on an empty stomach. 12 tablet 1 09/16/2015 at Unknown time  . aspirin EC 81 MG EC tablet Take 1 tablet (81 mg total) by mouth daily. 60 tablet 0 09/16/2015 at Unknown time  . atorvastatin (LIPITOR) 40 MG tablet TAKE 1 TABLET (40 MG TOTAL) BY MOUTH DAILY. 90 tablet 1 09/16/2015 at Unknown time  . busPIRone (BUSPAR) 15 MG tablet TAKE 1 TABLET (15 MG TOTAL) BY MOUTH EVERY 8 (EIGHT) HOURS AS NEEDED. (Patient taking differently: Take 15 mg by mouth 3 (three) times daily as needed (anxiety). TAKE 1 TABLET (15 MG TOTAL) BY MOUTH EVERY 8 (EIGHT) HOURS AS NEEDED.) 90  tablet 1 09/16/2015 at Unknown time  . Cholecalciferol (VITAMIN D) 2000 UNITS tablet Take 2,000 Units by mouth daily.     09/16/2015 at Unknown time  . citalopram (CELEXA) 20 MG tablet Take 1 tablet (20 mg total) by mouth daily. 30 tablet 5 not yet started  . dexlansoprazole (DEXILANT) 60 MG capsule TAKE 1 CAPSULE (60 MG TOTAL) BY MOUTH DAILY. 90 capsule 1 09/16/2015 at Unknown time  . diltiazem (CARDIZEM CD) 180 MG 24 hr capsule TAKE 1 CAPSULE (180 MG TOTAL) BY MOUTH DAILY. 90 capsule 1 09/16/2015 at Unknown time  . Fluticasone-Salmeterol (ADVAIR DISKUS) 250-50 MCG/DOSE AEPB Inhale 1 puff into the lungs 2 (two) times daily. 60 each 5 09/16/2015 at Unknown time  . HYDROcodone-acetaminophen (NORCO/VICODIN) 5-325 MG per tablet Take 1 tablet by mouth 2 (two) times daily as needed for moderate pain. (Patient taking differently: Take 1 tablet by mouth every 6 (six) hours as needed for moderate pain. ) 60 tablet 0 unknown  . isosorbide mononitrate (IMDUR) 30 MG 24 hr tablet Take 1 tablet (30 mg total) by mouth daily. 90 tablet 1 09/16/2015 at Unknown time  . metoprolol (LOPRESSOR) 50 MG tablet Take 1 tablet (50 mg total) by mouth 2 (two) times daily. 180 tablet 1 09/16/2015 at 800  . nitroGLYCERIN (NITROSTAT) 0.4 MG SL tablet Place  1 tablet (0.4 mg total) under the tongue every 5 (five) minutes as needed for chest pain. 25 tablet 6 unknown  . potassium chloride SA (KLOR-CON M20) 20 MEQ tablet Take 1 tablet (20 mEq total) by mouth daily. 90 tablet 1 09/16/2015 at Unknown time  . tiotropium (SPIRIVA HANDIHALER) 18 MCG inhalation capsule PLACE 1 CAPSULE (18 MCG TOTAL) INTO INHALER AND INHALE DAILY. 90 capsule 1 09/16/2015 at Unknown time  . torsemide (DEMADEX) 20 MG tablet Take 1 tablet (20 mg total) by mouth once. Take one tablet by mouth if weight is 160 pounds or above 90 tablet 1 09/16/2015 at Unknown time  . warfarin (COUMADIN) 5 MG tablet Take 0.5-1 tablets (2.5-5 mg total) by mouth See admin instructions. Takes 1  tablet ( ) every Tuesdays and Fridays and 1/2 tablet (2.5 mg) all other days. 60 tablet 5 09/15/2015 at Unknown time    Assessment: 81 y.o. female admitted with AMS/possible TIA, h/o Afib, to continue Coumadin  Goal of Therapy:  INR 2-3 Monitor platelets by anticoagulation protocol: Yes   Plan:  Coumadin 2.5 mg today Daily INR  Abbott, Gary Fleet 09/17/2015,5:10 AM

## 2015-09-17 NOTE — Procedures (Signed)
ELECTROENCEPHALOGRAM REPORT   Patient: [name]       Age: 81 y.o.        Sex: female Referring Physician: Dr Jerral Ralph Report Date:  09/17/2015        Interpreting Physician: Omelia Blackwater  History: Idamae A Tuohy is an 81 y.o. female admitted with AMS  Medications:  Scheduled: . aspirin EC  81 mg Oral Daily  . atorvastatin  40 mg Oral q1800  . diltiazem  180 mg Oral Daily  . isosorbide mononitrate  30 mg Oral Daily  . metoprolol  50 mg Oral BID  . mometasone-formoterol  2 puff Inhalation BID  . pantoprazole  40 mg Oral Daily  . predniSONE  50 mg Oral Q breakfast  . warfarin  2.5 mg Oral ONCE-1800  . Warfarin - Pharmacist Dosing Inpatient   Does not apply q1800    Conditions of Recording:  This is a 16 channel EEG carried out with the patient in the drowsy state.  Description:  The waking background activity consists of a very low voltage, symmetrical, fairly well organized, mix of delta and theta activity. No posterior dominant alpha rhythm is noted. No focal slowing or epileptiform activity is noted.   Hyperventilation was not performed. Intermittent photic stimulation was not performed.   IMPRESSION: Abnormal EEG due to the presence of low voltage generalized slowing indicating a moderate to severe cerebral disturbance (encephalopathy). No epileptiform activity noted.    Elspeth Cho, DO [facility] [phone]  If 7pm- 7am, please page neurology on call as listed in AMION. 09/17/2015, 12:05 PM

## 2015-09-17 NOTE — Progress Notes (Signed)
Initial Nutrition Assessment  INTERVENTION:  Provide Ensure Enlive po once daily, each supplement provides 350 kcal and 20 grams of protein  NUTRITION DIAGNOSIS:   Predicted suboptimal nutrient intake related to poor appetite, lethargy/confusion as evidenced by per patient/family report.   GOAL:   Patient will meet greater than or equal to 90% of their needs   MONITOR:   PO intake, Supplement acceptance, Labs, Weight trends  REASON FOR ASSESSMENT:   Malnutrition Screening Tool    ASSESSMENT:   81 y.o. female with Past medical history of hypertension coronary disease, A. fib on anticoagulation, chronic diastolic CHF. Patient presented with numbness of progressively worsening confusion.  Pt awake and engaged; seemingly a little confused still. Pt states that she has had a poor appetite and has been eating less for the past week; denies any contributing symptoms. States she was told she has lost weight but, doesn't know how much. Patient states she ate eggs for breakfast but, didn't get lunch. Lunch tray was at bedside untouched. RD set up meal tray and pt started eating immediately. Pt appears well-nourished and no evidence of recent weight loss per weight history.   Labs: low GFR, high hemoglobin  Diet Order:  Diet Heart Room service appropriate?: Yes; Fluid consistency:: Thin  Skin:  Reviewed, no issues  Last BM:  PTA  Height:   Ht Readings from Last 1 Encounters:  09/16/15  (1.651 m)    Weight:   Wt Readings from Last 1 Encounters:  09/16/15 160 lb (72.576 kg)    Ideal Body Weight:  56.8 kg  BMI:  Body mass index is 26.63 kg/(m^2).  Estimated Nutritional Needs:   Kcal:  1700-1900  Protein:  85-100 grams  Fluid:  1.7-1.9 L/day  EDUCATION NEEDS:   No education needs identified at this time  [name] RD, LDN Inpatient Clinical Dietitian Pager: [phone] After Hours Pager: [phone]

## 2015-09-17 NOTE — H&P (Signed)
[facility] History and Physical  Patient: [name]  MRN: [id number]  DOB: Sep 24, 1933  DOS: the patient was seen and examined on 09/16/2015 PCP: Bennie Pierini, [name]  Referring physician: Dr. Criss Alvine Chief Complaint: Confusion  HPI: [name] is a 81 y.o. female with Past medical history of hypertension coronary disease, A. fib on anticoagulation, chronic diastolic CHF. Patient presented with numbness of progressively worsening confusion. Patient lives alone on her own. Her family member lives close by her. Patient has been found to be having some slurred speech earlier in the afternoon. Patient was seen at her PCPs office earlier in the morning and was at her baseline. Currently the time of my evaluation family mentions patient is again at her baseline. Patient did have some generalized weakness as well. The patient has been having some cough over last few days no fever no chills no chest pain no abdominal pain no nausea no vomiting no choking episode of diarrhea. Patient has been started on Celexa today but other medications are old. No fall trauma or injury.  The patient is coming from home.  At her baseline ambulates with support And is independent for most of her ADL; manages her medication on her own.  Review of Systems: as mentioned in the history of present illness.  A comprehensive review of the other systems is negative.  Past Medical History  Diagnosis Date  . Hypertension   . CAD (coronary artery disease) 5/96  . Edema   . Hyperlipidemia   . Chronic atrial fibrillation     Coumadin therapy   . Osteopenia   . Renal insufficiency   . Closed right ankle fracture   . CHF (congestive heart failure)   . LBP (low back pain)   . Arthritis   . Cataract   . COPD (chronic obstructive pulmonary disease)     wears O2 at home as needed  . Esophageal stricture   . Esophageal dysmotility 09/2011    seen on barium esophagram.  . Carotid artery  occlusion    Past Surgical History  Procedure Laterality Date  . Heart stent      X2  . Mole removed  2013    umbilicus  . Esophagogastroduodenoscopy  2012, 05/2013,09/2013    dilation of esophagus and stricture in 09/2011, 05/2013.  meat disimpaction 09/2013  . Esophagogastroduodenoscopy N/A 10/18/2013    Procedure: ESOPHAGOGASTRODUODENOSCOPY (EGD);  Surgeon: Meryl Dare, MD;  Location: [facility] ENDOSCOPY;  Service: Endoscopy;  Laterality: N/A;  possible food impaction   Social History:  reports that she has been smoking Cigarettes.  She has been smoking about 0.50 packs per day. She has never used smokeless tobacco. She reports that she does not drink alcohol or use illicit drugs.  No Known Allergies  Family History  Problem Relation Age of Onset  . Colon cancer Neg Hx   . Esophageal cancer Neg Hx   . Stomach cancer Neg Hx   . Rectal cancer Neg Hx   . Hyperlipidemia Mother   . Hypertension Mother   . Hyperlipidemia Father   . Heart disease Father     After age 60  . Cancer Sister     Lung  . Hyperlipidemia Sister   . Hypertension Sister     Prior to Admission medications   Medication Sig Start Date End Date Taking? Authorizing Provider  alendronate (FOSAMAX) 70 MG tablet Take 1 tablet (70 mg total) by mouth once a week. Take with a full glass of water  on an empty stomach. 09/16/15  Yes Mary-Margaret Daphine Deutscher, [name]  aspirin EC 81 MG EC tablet Take 1 tablet (81 mg total) by mouth daily. 07/30/15  Yes Richarda Overlie, MD  atorvastatin (LIPITOR) 40 MG tablet TAKE 1 TABLET (40 MG TOTAL) BY MOUTH DAILY. 09/16/15  Yes Mary-Margaret Daphine Deutscher, [name]  busPIRone (BUSPAR) 15 MG tablet TAKE 1 TABLET (15 MG TOTAL) BY MOUTH EVERY 8 (EIGHT) HOURS AS NEEDED. Patient taking differently: Take 15 mg by mouth 3 (three) times daily as needed (anxiety). TAKE 1 TABLET (15 MG TOTAL) BY MOUTH EVERY 8 (EIGHT) HOURS AS NEEDED. 09/16/15  Yes Mary-Margaret Daphine Deutscher, [name]  Cholecalciferol (VITAMIN D) 2000 UNITS tablet Take  2,000 Units by mouth daily.     Yes Historical Provider, MD  citalopram (CELEXA) 20 MG tablet Take 1 tablet (20 mg total) by mouth daily. 09/16/15  Yes Mary-Margaret Daphine Deutscher, [name]  dexlansoprazole (DEXILANT) 60 MG capsule TAKE 1 CAPSULE (60 MG TOTAL) BY MOUTH DAILY. 09/16/15  Yes Mary-Margaret Daphine Deutscher, [name]  diltiazem (CARDIZEM CD) 180 MG 24 hr capsule TAKE 1 CAPSULE (180 MG TOTAL) BY MOUTH DAILY. 09/16/15  Yes Mary-Margaret Daphine Deutscher, [name]  Fluticasone-Salmeterol (ADVAIR DISKUS) 250-50 MCG/DOSE AEPB Inhale 1 puff into the lungs 2 (two) times daily. 06/16/15  Yes Mary-Margaret Daphine Deutscher, [name]  HYDROcodone-acetaminophen (NORCO/VICODIN) 5-325 MG per tablet Take 1 tablet by mouth 2 (two) times daily as needed for moderate pain. Patient taking differently: Take 1 tablet by mouth every 6 (six) hours as needed for moderate pain.  09/10/15  Yes Mary-Margaret Daphine Deutscher, [name]  isosorbide mononitrate (IMDUR) 30 MG 24 hr tablet Take 1 tablet (30 mg total) by mouth daily. 09/16/15  Yes Mary-Margaret Daphine Deutscher, [name]  metoprolol (LOPRESSOR) 50 MG tablet Take 1 tablet (50 mg total) by mouth 2 (two) times daily. 09/16/15  Yes Mary-Margaret Daphine Deutscher, [name]  nitroGLYCERIN (NITROSTAT) 0.4 MG SL tablet Place 1 tablet (0.4 mg total) under the tongue every 5 (five) minutes as needed for chest pain. 04/10/14  Yes Rollene Rotunda, MD  potassium chloride SA (KLOR-CON M20) 20 MEQ tablet Take 1 tablet (20 mEq total) by mouth daily. 09/16/15  Yes Mary-Margaret Daphine Deutscher, [name]  tiotropium (SPIRIVA HANDIHALER) 18 MCG inhalation capsule PLACE 1 CAPSULE (18 MCG TOTAL) INTO INHALER AND INHALE DAILY. 09/16/15  Yes Mary-Margaret Daphine Deutscher, [name]  torsemide (DEMADEX) 20 MG tablet Take 1 tablet (20 mg total) by mouth once. Take one tablet by mouth if weight is 160 pounds or above 09/16/15  Yes Mary-Margaret Daphine Deutscher, [name]  warfarin (COUMADIN) 5 MG tablet Take 0.5-1 tablets (2.5-5 mg total) by mouth See admin instructions. Takes 1 tablet ( ) every Tuesdays and Fridays and 1/2 tablet  (2.5 mg) all other days. 09/16/15  Yes Mary-Margaret Daphine Deutscher, [name]    Physical Exam: Filed Vitals:   09/16/15 2331 09/17/15 0040 09/17/15 0245 09/17/15 0445  BP:  167/86 167/95 181/89  Pulse:  101 110 83  Temp:  97.7 F (36.5 C) 98 F (36.7 C) 98.4 F (36.9 C)  TempSrc:  Oral Oral Oral  Resp:   20 18  Height:      Weight:      SpO2: 94% 100% 95% 95%    General: Alert, Awake and Oriented to Time, Place and Person. Appear in mild distress Eyes: PERRL ENT: Oral Mucosa clear moist. Neck: no JVD Cardiovascular: S1 and S2 Present, no Murmur, Peripheral Pulses Present Respiratory: Bilateral Air entry equal and Decreased,  Bilateral expiratory wheezes Abdomen: Bowel Sound present, Soft and no tenderness Skin: no Rash Extremities: no  Pedal edema, no calf tenderness Neurologic: Mental status AAOx3, speech normal, attention normal,  Cranial Nerves PERRL, EOM normal and present, facial sensation to light touch present,  Motor strength bilateral equal strength 5/5,  Sensation present to light touch,  Reflexes present knee and biceps, babinski negative,  Cerebellar test normal finger nose finger.  Labs on Admission:  CBC:  Recent Labs Lab 09/16/15 1616  WBC 8.1  NEUTROABS 6.1  HGB 15.1*  HCT 46.2*  MCV 94.7  PLT 216    CMP     Component Value Date/Time   NA 139 09/16/2015 1616   NA 143 08/11/2015 1533   K 4.7 09/16/2015 1616   CL 104 09/16/2015 1616   CO2 26 09/16/2015 1616   GLUCOSE 124* 09/16/2015 1616   GLUCOSE 99 08/11/2015 1533   BUN 22* 09/16/2015 1616   BUN 15 08/11/2015 1533   CREATININE 1.82* 09/16/2015 1616   CREATININE 1.13* 06/25/2013 1122   CALCIUM 9.1 09/16/2015 1616   PROT 5.8* 07/29/2015 1826   PROT 5.9* 06/16/2015 1211   ALBUMIN 3.2* 07/29/2015 1826   AST 15 07/29/2015 1826   ALT 8* 07/29/2015 1826   ALKPHOS 46 07/29/2015 1826   BILITOT 0.7 07/29/2015 1826   BILITOT 0.4 06/16/2015 1211   GFRNONAA 25* 09/16/2015 1616   GFRNONAA 47* 06/25/2013  1122   GFRAA 29* 09/16/2015 1616   GFRAA 54* 06/25/2013 1122    No results for input(s): CKTOTAL, CKMB, CKMBINDEX, TROPONINI in the last 168 hours. BNP (last 3 results)  Recent Labs  02/26/15 1703 07/29/15 1828  BNP 126.3* 203.6*    ProBNP (last 3 results) No results for input(s): PROBNP in the last 8760 hours.   Radiological Exams on Admission: Dg Chest 2 View  09/16/2015   CLINICAL DATA:  Confusion, altered mental status  EXAM: CHEST  2 VIEW  COMPARISON:  08/14/2015  FINDINGS: Marked cardiomegaly with mild vascular congestion. No current edema or effusion. Lungs remain clear. No focal pneumonia, collapse or consolidation. Aorta is atherosclerotic and tortuous as before. Degenerative changes of the spine. Bones are osteopenic. No effusion or pneumothorax. No significant interval change.  IMPRESSION: Cardiomegaly with vascular congestion  No superimposed acute process  Aortic atherosclerosis and tortuosity   Electronically Signed   By: Judie Petit.  Shick M.D.   On: 09/16/2015 17:14   Ct Head Wo Contrast  09/16/2015   CLINICAL DATA:  Frontal headache. Slurred speech and confusion started around 2:20 p.m. History of atrial fibrillation and COPD.  EXAM: CT HEAD WITHOUT CONTRAST  TECHNIQUE: Contiguous axial images were obtained from the base of the skull through the vertex without intravenous contrast.  COMPARISON:  None.  FINDINGS: There is mild central and cortical atrophy. Periventricular white matter changes are consistent with small vessel disease. There is encephalomalacia in the left occipital lobe consistent with old infarct. Focal ischemic changes identified in the right parietal lobe and left frontal lobe. An old lacunar infarct is identified within the right putamen. No associated hemorrhage. No evidence for acute infarction.  Bone windows are unremarkable. Note is made of atherosclerotic calcification of the internal carotid arteries.  IMPRESSION: 1. Significant atrophy and small vessel  disease. 2. 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 3.  No evidence for acute intracranial abnormality.    Globe   Electronically Signed   By: Norva Pavlov M.D.   On: 09/16/2015 18:53   EKG: Independently reviewed. atrial fibrillation, rate controlled.  Assessment/Plan 1. Acute encephalopathy Possible TIA. The patient is presenting with confusion. Currently appears stable baseline. Patient was having some slurred speech initially. She is on anticoagulation. With CT scan is negative as well. TIA cannot be ruled out although suspicion is really less likely. I will get MRI of the brain in the morning. Serial neuro checks. Continue with home medications. I would also check other blood work to rule out Any acute encephalopathy.  Essential hypertension, benign Continuing home medications.  CAD (coronary artery disease), native coronary artery Continue aspirin as well as warfarin.  Hyperlipidemia with target LDL less than 100 Continue home medications.   Atrial fibrillation Continue Cardizem. Also continue warfarin.  COPD exacerbation. Patient has expiratory wheezing. Most likely appearing to be COPD exacerbation. Patient does not wear her oxygen on a regular basis which she is supposed to her 24 7. With put her on oral prednisone as well as DuoNeb's.  Chronic kidney disease (CKD) Mild worsening of renal function. We will hold her diuretic and continue her on gentle hydration.  Nutrition: Cardiac diet  DVT Prophylaxis:on therapeutic anticoagulation.  Advance goals of care discussion: DNR/DNI   Family Communication: family was present at bedside, opportunity was given to ask question and all questions were answered satisfactorily at the time of interview. Disposition: Admitted as observation, telemetry unit. Author: Lynden Oxford, MD [facility] Pager: [phone] 09/16/2015  If  7PM-7AM, please contact night-coverage [email or web address] Password [email or web address]

## 2015-09-18 DIAGNOSIS — M545 Low back pain: Secondary | ICD-10-CM | POA: Diagnosis not present

## 2015-09-18 DIAGNOSIS — N183 Chronic kidney disease, stage 3 (moderate): Secondary | ICD-10-CM | POA: Diagnosis not present

## 2015-09-18 DIAGNOSIS — J441 Chronic obstructive pulmonary disease with (acute) exacerbation: Secondary | ICD-10-CM | POA: Diagnosis not present

## 2015-09-18 DIAGNOSIS — G9340 Encephalopathy, unspecified: Secondary | ICD-10-CM | POA: Diagnosis not present

## 2015-09-18 LAB — PROTIME-INR
INR: 2.04 — ABNORMAL HIGH (ref 0.00–1.49)
Prothrombin Time: 22.9 seconds — ABNORMAL HIGH (ref 11.6–15.2)

## 2015-09-18 LAB — HEMOGLOBIN A1C
Hgb A1c MFr Bld: 6.4 % — ABNORMAL HIGH (ref 4.8–5.6)
Mean Plasma Glucose: 137 mg/dL

## 2015-09-18 MED ORDER — PREDNISONE 10 MG PO TABS: ORAL_TABLET | ORAL | Status: DC

## 2015-09-18 MED ORDER — ENSURE ENLIVE PO LIQD: 237.0000 mL | ORAL | Status: DC

## 2015-09-18 NOTE — Discharge Summary (Signed)
PATIENT DETAILS Name: [name] Age: 81 y.o. Sex: female Date of Birth: 1933-06-08 MRN: [id number]. Admitting Physician: Rolly Salter, MD WUJ:WJXBJY,NWGN MARGARET, [name]  Admit Date: 09/16/2015 Discharge date: 09/18/2015  Recommendations for Outpatient Follow-up:  1. Admitted with transient confusion-unknown etiology-MRI brain/EEG negative-? Secondary to narcotic/benzodiazepine's-please slowly minimize (on chronic benzos and narcotics) and taper as much as possible. 2. Please consider outpatient referral to urology if symptoms recur.   PRIMARY DISCHARGE DIAGNOSIS:  Principal Problem:   Acute encephalopathy Active Problems:   Essential hypertension, benign   CAD (coronary artery disease), native coronary artery   Hyperlipidemia with target LDL less than 100   Atrial fibrillation   Chronic low back pain   Hypothyroidism   COPD exacerbation   Chronic kidney disease (CKD)      PAST MEDICAL HISTORY: Past Medical History  Diagnosis Date  . Hypertension   . CAD (coronary artery disease) 5/96  . Edema   . Hyperlipidemia   . Chronic atrial fibrillation     Coumadin therapy   . Osteopenia   . Renal insufficiency   . Closed right ankle fracture   . CHF (congestive heart failure)   . LBP (low back pain)   . Arthritis   . Cataract   . COPD (chronic obstructive pulmonary disease)     wears O2 at home as needed  . Esophageal stricture   . Esophageal dysmotility 09/2011    seen on barium esophagram.  . Carotid artery occlusion     DISCHARGE MEDICATIONS: Current Discharge Medication List    START taking these medications   Details  feeding supplement, ENSURE ENLIVE, (ENSURE ENLIVE) LIQD Take 237 mLs by mouth daily. Qty: 60 Bottle, Refills: 0    predniSONE (DELTASONE) 10 MG tablet Take 4 tablets (40 mg) daily for 2 days, then, Take 3 tablets (30 mg) daily for 2 days, then, Take 2 tablets (20 mg) daily for 2 days, then, Take 1 tablets (10 mg) daily for 1 days, then  stop Qty: 19 tablet, Refills: 0      CONTINUE these medications which have NOT CHANGED   Details  alendronate (FOSAMAX) 70 MG tablet Take 1 tablet (70 mg total) by mouth once a week. Take with a full glass of water on an empty stomach. Qty: 12 tablet, Refills: 1   Associated Diagnoses: Osteoporosis    aspirin EC 81 MG EC tablet Take 1 tablet (81 mg total) by mouth daily. Qty: 60 tablet, Refills: 0    atorvastatin (LIPITOR) 40 MG tablet TAKE 1 TABLET (40 MG TOTAL) BY MOUTH DAILY. Qty: 90 tablet, Refills: 1   Associated Diagnoses: Hyperlipidemia with target LDL less than 100    busPIRone (BUSPAR) 15 MG tablet TAKE 1 TABLET (15 MG TOTAL) BY MOUTH EVERY 8 (EIGHT) HOURS AS NEEDED. Qty: 90 tablet, Refills: 1   Associated Diagnoses: GAD (generalized anxiety disorder)    Cholecalciferol (VITAMIN D) 2000 UNITS tablet Take 2,000 Units by mouth daily.      citalopram (CELEXA) 20 MG tablet Take 1 tablet (20 mg total) by mouth daily. Qty: 30 tablet, Refills: 5   Associated Diagnoses: GAD (generalized anxiety disorder)    dexlansoprazole (DEXILANT) 60 MG capsule TAKE 1 CAPSULE (60 MG TOTAL) BY MOUTH DAILY. Qty: 90 capsule, Refills: 1   Associated Diagnoses: Gastroesophageal reflux disease without esophagitis    diltiazem (CARDIZEM CD) 180 MG 24 hr capsule TAKE 1 CAPSULE (180 MG TOTAL) BY MOUTH DAILY. Qty: 90 capsule, Refills: 1   Associated  Diagnoses: Coronary artery disease involving coronary bypass graft with unspecified angina pectoris    Fluticasone-Salmeterol (ADVAIR DISKUS) 250-50 MCG/DOSE AEPB Inhale 1 puff into the lungs 2 (two) times daily. Qty: 60 each, Refills: 5   Associated Diagnoses: Chronic low back pain    HYDROcodone-acetaminophen (NORCO/VICODIN) 5-325 MG per tablet Take 1 tablet by mouth 2 (two) times daily as needed for moderate pain. Qty: 60 tablet, Refills: 0   Associated Diagnoses: Chronic low back pain    isosorbide mononitrate (IMDUR) 30 MG 24 hr tablet Take 1  tablet (30 mg total) by mouth daily. Qty: 90 tablet, Refills: 1   Associated Diagnoses: Coronary artery disease involving native coronary artery of native heart without angina pectoris    metoprolol (LOPRESSOR) 50 MG tablet Take 1 tablet (50 mg total) by mouth 2 (two) times daily. Qty: 180 tablet, Refills: 1   Associated Diagnoses: Essential hypertension, benign    nitroGLYCERIN (NITROSTAT) 0.4 MG SL tablet Place 1 tablet (0.4 mg total) under the tongue every 5 (five) minutes as needed for chest pain. Qty: 25 tablet, Refills: 6    potassium chloride SA (KLOR-CON M20) 20 MEQ tablet Take 1 tablet (20 mEq total) by mouth daily. Qty: 90 tablet, Refills: 1   Associated Diagnoses: Hypokalemia    tiotropium (SPIRIVA HANDIHALER) 18 MCG inhalation capsule PLACE 1 CAPSULE (18 MCG TOTAL) INTO INHALER AND INHALE DAILY. Qty: 90 capsule, Refills: 1   Associated Diagnoses: Mucopurulent chronic bronchitis    torsemide (DEMADEX) 20 MG tablet Take 1 tablet (20 mg total) by mouth once. Take one tablet by mouth if weight is 160 pounds or above Qty: 90 tablet, Refills: 1   Associated Diagnoses: Peripheral edema    warfarin (COUMADIN) 5 MG tablet Take 0.5-1 tablets (2.5-5 mg total) by mouth See admin instructions. Takes 1 tablet ( ) every Tuesdays and Fridays and 1/2 tablet (2.5 mg) all other days. Qty: 60 tablet, Refills: 5   Associated Diagnoses: Persistent atrial fibrillation        ALLERGIES:  No Known Allergies  BRIEF HPI:  See H&P, Labs, Consult and Test reports for all details in brief, patient was admitted for evaluation of confusion.  CONSULTATIONS:   None  PERTINENT RADIOLOGIC STUDIES: Dg Chest 2 View  09/16/2015   CLINICAL DATA:  Confusion, altered mental status  EXAM: CHEST  2 VIEW  COMPARISON:  08/14/2015  FINDINGS: Marked cardiomegaly with mild vascular congestion. No current edema or effusion. Lungs remain clear. No focal pneumonia, collapse or consolidation. Aorta is  atherosclerotic and tortuous as before. Degenerative changes of the spine. Bones are osteopenic. No effusion or pneumothorax. No significant interval change.  IMPRESSION: Cardiomegaly with vascular congestion  No superimposed acute process  Aortic atherosclerosis and tortuosity   Electronically Signed   By: Judie Petit.  Shick M.D.   On: 09/16/2015 17:14   Ct Head Wo Contrast  09/16/2015   CLINICAL DATA:  Frontal headache. Slurred speech and confusion started around 2:20 p.m. History of atrial fibrillation and COPD.  EXAM: CT HEAD WITHOUT CONTRAST  TECHNIQUE: Contiguous axial images were obtained from the base of the skull through the vertex without intravenous contrast.  COMPARISON:  None.  FINDINGS: There is mild central and cortical atrophy. Periventricular white matter changes are consistent with small vessel disease. There is encephalomalacia in the left occipital lobe consistent with old infarct. Focal ischemic changes identified in the right parietal lobe and left frontal lobe. An old lacunar infarct is identified within the right putamen. No associated hemorrhage. No  evidence for acute infarction.  Bone windows are unremarkable. Note is made of atherosclerotic calcification of the internal carotid arteries.  IMPRESSION: 1. Significant atrophy and small vessel disease. 2. 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 3.  No evidence for acute intracranial abnormality.    Globe   Electronically Signed   By: Norva Pavlov M.D.   On: 09/16/2015 18:53   Mr Brain Wo Contrast  09/17/2015   CLINICAL DATA:  Acute encephalopathy. Numbness and progressively worsening confusion. Slurred speech. Generalized weakness.  EXAM: MRI HEAD WITHOUT CONTRAST  TECHNIQUE: Multiplanar, multiecho pulse sequences of the brain and surrounding structures were obtained without intravenous contrast.  COMPARISON:  09/16/2015 head CT.  08/13/2008 carotid ultrasound.   FINDINGS: There is no evidence of acute infarct, intracranial hemorrhage, mass, midline shift, or extra-axial fluid collection. Chronic left frontal and left parieto-occipital cortical infarcts and a chronic right basal ganglia infarct are again noted. There is mild global cerebral atrophy.  Patchy T2 hyperintensities in the cerebral white matter bilaterally separate from the areas of chronic infarction are nonspecific but compatible with moderate chronic small vessel ischemic disease. Mild chronic small vessel ischemic changes noted in the brainstem.  Orbits are unremarkable. Paranasal sinuses are clear. There is a trace left mastoid effusion. Abnormal appearance of the distal left internal carotid artery is consistent with previously demonstrated occlusion. Other major intracranial vascular flow voids are preserved.  IMPRESSION: 1. No acute intracranial abnormality. 2. Moderate chronic small vessel ischemic disease in the cerebral white matter. 3. 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 4. Chronically occluded left internal carotid artery.   Electronically Signed   By: Sebastian Ache M.D.   On: 09/17/2015 10:35     PERTINENT LAB RESULTS: CBC:  Recent Labs  09/16/15 1616 09/17/15 0630  WBC 8.1 6.4  HGB 15.1* 16.2*  HCT 46.2* 48.9*  PLT 216 196   CMET CMP     Component Value Date/Time   NA 137 09/17/2015 0630   NA 143 08/11/2015 1533   K 4.0 09/17/2015 0630   CL 103 09/17/2015 0630   CO2 24 09/17/2015 0630   GLUCOSE 181* 09/17/2015 0630   GLUCOSE 99 08/11/2015 1533   BUN 20 09/17/2015 0630   BUN 15 08/11/2015 1533   CREATININE 1.57* 09/17/2015 0630   CREATININE 1.13* 06/25/2013 1122   CALCIUM 8.7* 09/17/2015 0630   PROT 6.2* 09/17/2015 0630   PROT 5.9* 06/16/2015 1211   ALBUMIN 3.4* 09/17/2015 0630   AST 16 09/17/2015 0630   ALT 8* 09/17/2015 0630   ALKPHOS 45 09/17/2015 0630   BILITOT 0.6 09/17/2015 0630   BILITOT 0.4 06/16/2015 1211   GFRNONAA 30* 09/17/2015  0630   GFRNONAA 47* 06/25/2013 1122   GFRAA 35* 09/17/2015 0630   GFRAA 54* 06/25/2013 1122    GFR Estimated Creatinine Clearance: 28.5 mL/min (by C-G formula based on Cr of 1.57). No results for input(s): LIPASE, AMYLASE in the last 72 hours. No results for input(s): CKTOTAL, CKMB, CKMBINDEX, TROPONINI in the last 72 hours. Invalid input(s): POCBNP No results for input(s): DDIMER in the last 72 hours.  Recent Labs  09/17/15 0630  HGBA1C 6.4*    Recent Labs  09/17/15 0630  CHOL 147  HDL 46  LDLCALC 83  TRIG 91  CHOLHDL 3.2    Recent Labs  09/17/15 0630  TSH 0.332*    Recent Labs  09/17/15 0630  VITAMINB12 393  FOLATE  9.3   Coags:  Recent Labs  09/17/15 0630 09/18/15 0415  INR 1.97* 2.04*   Microbiology: No results found for this or any previous visit (from the past 240 hour(s)).   BRIEF HOSPITAL COURSE:  Acute encephalopathy: Etiology uncertain-? Polypharmacy related (on chronic narcotics and BuSpar). Patient denies any inadvertent extra dosing of these medications, however she is elderly and does acknowledge at times that when she has pain she sometimes takes an extra pill. Have Discussed with Family-Nephew at [facility] that family take control of these medications and give it to her as needed. She is on chronic narcotics-abruptly stopping would put her at risk of withdrawal. Would consider slowly tapering off of these medications. She was admitted and underwent workup, MRI brain negative for CVA, EEG negative. UA/chest x-ray negative for a UTI/pneumonia. Her encephalopathy resolved with just supportive care. By day of discharge, she is back to her usual baseline per nephew at bedside   Active Problems: Mild COPD exacerbation: Improved- Lungs clear this morning, begin prednisone taper. Continue with usual bronchodilators and discharge.  Chronic hypoxic respiratory failure: Continue oxygen. On home O2 prn.  Essential hypertension, benign: Moderately  Controlled, continue Cardizem, metoprolol and Imdur. Follow and adjust accordingly.  Atrial fibrillation: Rate controlled with metoprolol, on Coumadin with therapeutic INR. .CHA2DS2 - VASc score of 5  Chronic back pain: Stable, continue with as needed narcotics.? Contributing to polypharmacy-see above   GERD: Continue PPI  Anxiety: Continue with as needed BuSpar. Please consider slowly tapering off this medication-deferred to PCP  CAD (coronary artery disease), native coronary artery: Without chest pain or shortness of breath this morning. Appears stable. Continue aspirin, statin and beta blocker  Known history of left carotid artery occlusion: Chronic issue, continue aspirin and statin.  CKD stage 3-4: Close to her usual baseline. Monitor periodically.  TODAY-DAY OF DISCHARGE:  Subjective:   [place] today has no headache,no chest abdominal pain,no new weakness tingling or numbness, feels much better wants to go home today.   Objective:   Blood pressure 166/101, pulse 97, temperature 98.2 F (36.8 C), temperature source Oral, resp. rate 15, height  (1.651 m), weight 72.576 kg (160 lb), last menstrual period 03/27/1983, SpO2 94 %. No intake or output data in the 24 hours ending 09/18/15 0942 Filed Weights   09/16/15 1610  Weight: 72.576 kg (160 lb)    Exam Awake Alert, Oriented *3, No new F.N deficits, Normal affect [place].AT,PERRAL Supple Neck,No JVD, No cervical lymphadenopathy appriciated.  Symmetrical Chest wall movement, Good air movement bilaterally, CTAB RRR,No Gallops,Rubs or new Murmurs, No Parasternal Heave +ve B.Sounds, Abd Soft, Non tender, No organomegaly appriciated, No rebound -guarding or rigidity. No Cyanosis, Clubbing or edema, No new Rash or bruise  DISCHARGE CONDITION: Stable  DISPOSITION: Home with home health services  DISCHARGE INSTRUCTIONS:    Activity:  As tolerated with Full fall precautions use walker/cane & assistance as needed  Get  Medicines reviewed and adjusted: Please take all your medications with you for your next visit with your Primary MD  Please request your Primary MD to go over all hospital tests and procedure/radiological results at the follow up, please ask your Primary MD to get all Hospital records sent to his/her office.  If you experience worsening of your admission symptoms, develop shortness of breath, life threatening emergency, suicidal or homicidal thoughts you must seek medical attention immediately by calling 911 or calling your MD immediately  if symptoms less severe.  You must read complete instructions/literature along  with all the possible adverse reactions/side effects for all the Medicines you take and that have been prescribed to you. Take any new Medicines after you have completely understood and accpet all the possible adverse reactions/side effects.   Do not drive when taking Pain medications.   Do not take more than prescribed Pain, Sleep and Anxiety Medications  Special Instructions: If you have smoked or chewed Tobacco  in the last 2 yrs please stop smoking, stop any regular Alcohol  and or any Recreational drug use.  Wear Seat belts while driving.  Please note  You were cared for by a hospitalist during your hospital stay. Once you are discharged, your primary care physician will handle any further medical issues. Please note that NO REFILLS for any discharge medications will be authorized once you are discharged, as it is imperative that you return to your primary care physician (or establish a relationship with a primary care physician if you do not have one) for your aftercare needs so that they can reassess your need for medications and monitor your lab values.   Diet recommendation: Heart Healthy diet  Discharge Instructions    Call MD for:  extreme fatigue    Complete by:  As directed      Call MD for:  persistant dizziness or light-headedness    Complete by:  As directed        Diet - low sodium heart healthy    Complete by:  As directed      Increase activity slowly    Complete by:  As directed            Follow-up Information    Follow up with Bennie Pierini, [name].   Specialty:  Nurse Practitioner   Contact information:   [address] [place] [address] [phone]       Total Time spent on discharge equals  45 minutes.  SignedJeoffrey Massed 09/18/2015 9:42 AM

## 2015-09-18 NOTE — Care Management Note (Signed)
Case Management Note  Patient Details  Name: JYA HUGHSTON MRN: [id number] Date of Birth: May 20, 1933  Subjective/Objective:                    Action/Plan: Patient admitted with acute encephalopathy. Pt lives at home alone with a care giver that helps her with ADL's. Pt being discharged with home health services(PT/OT/RN). CM met with patient and her nephew at the bedside and provided them with a list of [facility] in [employer]. They selected [facility] with [place] notified and referral accepted. Bedside RN aware.  Expected Discharge Date:                  Expected Discharge Plan:  [place]  In-House Referral:     Discharge planning Services  CM Consult  Post Acute Care Choice:    Choice offered to:     DME Arranged:    DME Agency:     HH Arranged:  RN, PT, OT HH Agency:  [facility]  Status of Service:  Completed, signed off  Medicare Important Message Given:    Date Medicare IM Given:    Medicare IM give by:    Date Additional Medicare IM Given:    Additional Medicare Important Message give by:     If discussed at [place] of Stay Meetings, dates discussed:    Additional Comments:  Pollie Friar, RN 09/18/2015, 2:18 PM

## 2015-09-18 NOTE — Evaluation (Signed)
Occupational Therapy Evaluation Patient Details Name: [name] MRN: [id number] DOB: Jan 29, 1933 Today's Date: 09/18/2015    History of Present Illness 81 y.o. female admitted for Acute encephalopathy with mild COPD exacerbation.   Clinical Impression   Pt admitted with above and currently demonstrating functional limitations due to the deficits listed below.  Pt with no LOB this session, however demonstrates DOE during self-care tasks with this OT.  Pt on 2.5L on arrival, removed O2 during dressing task with pt dropping to 79% on room air.  Educated on pursed lip breathing and recommend use of supplemental O2 and previously prescribed.  Pt's nephew present and reports he and other family members can provide supervision as needed.  Pt to d/c home today with family therefore no further needs at this time, however recommend follow up HHOT.    Follow Up Recommendations  Home health OT    Equipment Recommendations  None recommended by OT    Recommendations for Other Services       Precautions / Restrictions Precautions Precautions: Fall Restrictions Weight Bearing Restrictions: No      Mobility Bed Mobility                  Transfers Overall transfer level: Needs assistance   Transfers: Sit to/from Stand Sit to Stand: Min guard         General transfer comment: Close guard for safety. Effortful for pt to stand however no LOB this session.         ADL Overall ADL's : Needs assistance/impaired         Upper Body Bathing: Supervision/ safety   Lower Body Bathing: Supervison/ safety   Upper Body Dressing : Supervision/safety   Lower Body Dressing: Supervision/safety;Set up;Cueing for safety   Toilet Transfer: Min guard;Ambulation;BSC             General ADL Comments: Pt requires increased time and cues for safety.  Educated on pursed lip breathing as she has DOE quickly and recommend continuous use of supplemental oxygen, as pt reports using it  intermittently at home PTA.     Vision Vision Assessment?: No apparent visual deficits   Perception Perception Comments: WNL   Praxis Praxis Praxis tested?: Within functional limits    Pertinent Vitals/Pain Pain Assessment: No/denies pain     Hand Dominance Right   Extremity/Trunk Assessment Upper Extremity Assessment Upper Extremity Assessment: Generalized weakness   Lower Extremity Assessment Lower Extremity Assessment: Defer to PT evaluation       Communication Communication Communication: No difficulties   Cognition Arousal/Alertness: Awake/alert Behavior During Therapy: WFL for tasks assessed/performed Overall Cognitive Status: Within Functional Limits for tasks assessed                                Home Living Family/patient expects to be discharged to:: Private residence Living Arrangements: Alone Available Help at Discharge: Family;Available 24 hours/day ("as much as needed") Type of Home: House Home Access: Stairs to enter [employer] of Steps: 2 Entrance Stairs-Rails: None Home Layout: One level     Bathroom Shower/Tub: Tub/shower unit Shower/tub characteristics: Curtain       Home Equipment: Environmental consultant - 2 wheels;Walker - 4 wheels;Cane - single point;Shower seat;Bedside commode          Prior Functioning/Environment Level of Independence: Independent with assistive device(s)        Comments: Uses cane at baseline    OT Diagnosis: Generalized  weakness   OT Problem List: Decreased strength;Decreased activity tolerance;Decreased safety awareness   OT Treatment/Interventions:      OT Goals(Current goals can be found in the care plan section) Acute Rehab OT Goals Patient Stated Goal: Go home OT Goal Formulation: All assessment and education complete, DC therapy Time For Goal Achievement: 09/25/15 Potential to Achieve Goals: Good   End of Session Equipment Utilized During Treatment: Rolling walker  Activity  Tolerance: Patient tolerated treatment well;No increased pain Patient left: in chair;with call bell/phone within reach;with chair alarm set;with family/visitor present   Time: 1610-9604 OT Time Calculation (min): 18 min Charges:  OT General Charges $OT Visit: 1 Procedure OT Evaluation $Initial OT Evaluation Tier I: 1 Procedure G-Codes: OT G-codes **NOT FOR INPATIENT CLASS** Functional Assessment Tool Used: clinical judgement Functional Limitation: Self care Self Care Current Status (V4098): At least 20 percent but less than 40 percent impaired, limited or restricted Self Care Goal Status (J1914): At least 1 percent but less than 20 percent impaired, limited or restricted Self Care Discharge Status [phone]): At least 1 percent but less than 20 percent impaired, limited or restricted  Rosalio Loud, [phone] 09/18/2015, 10:33 AM

## 2015-09-18 NOTE — Progress Notes (Signed)
Physical Therapy Treatment Patient Details Name: [name] MRN: [id number] DOB: 19-Apr-1933 Today's Date: 09/18/2015    History of Present Illness 81 y.o. female admitted for Acute encephalopathy with mild COPD exacerbation.    PT Comments    Patient progressing with mobility able to walk in hallway with single point cane, but limited due to increased back pain.  Feel will benefit from initial 24 hour supervision as continues with slower responses and some mild decreased safety awareness.  Will need follow up HHPT.  Follow Up Recommendations  Home health PT;Supervision/Assistance - 24 hour     Equipment Recommendations  None recommended by PT    Recommendations for Other Services       Precautions / Restrictions Precautions Precautions: Fall Restrictions Weight Bearing Restrictions: No    Mobility  Bed Mobility               General bed mobility comments: up in chair  Transfers Overall transfer level: Needs assistance Equipment used: Straight cane (pushed up with both armrests) Transfers: Sit to/from Stand Sit to Stand: Supervision         General transfer comment: Close guard for safety. Effortful for pt to stand however no LOB this session.  Ambulation/Gait Ambulation/Gait assistance: Supervision;Min guard Ambulation Distance (Feet): 100 Feet Assistive device: Straight cane;Rolling walker (2 wheeled) Gait Pattern/deviations: Step-through pattern;Decreased stride length;Shuffle;Trunk flexed     General Gait Details: with cane initially slow, but no loss of balance, little unsteady if turning head to look around and CGA provided; then used walker at end due to c/o increased back pain   Stairs            Wheelchair Mobility    Modified Rankin (Stroke Patients Only) Modified Rankin (Stroke Patients Only) Pre-Morbid Rankin Score: Slight disability Modified Rankin: Moderately severe disability     Balance Overall balance assessment: Needs  assistance         Standing balance support: Single extremity supported;No upper extremity supported Standing balance-Leahy Scale: Fair Standing balance comment: use of cane for ambultation intermittently                    Cognition Arousal/Alertness: Awake/alert Behavior During Therapy: WFL for tasks assessed/performed Overall Cognitive Status: Within Functional Limits for tasks assessed Area of Impairment: Following commands;Problem solving       Following Commands: Follows one step commands with increased time     Problem Solving: Slow processing General Comments: little trouble with wayfinding in hallway even after cues to turn for going to therapy room to practice steps    Exercises      General Comments General comments (skin integrity, edema, etc.): Pt's nephew present at beginning of session and reports that multiple family members all live on same property and can provide assistance.  Noted O2 to drop to 79% on room air (less than 5 mins) with dressing.  Returned to mid 90s on 2.5L O2 and cues for pursed lip breathing.      Pertinent Vitals/Pain Pain Assessment: Faces Faces Pain Scale: Hurts little more Pain Location: back Pain Descriptors / Indicators: Aching Pain Intervention(s): Monitored during session;Repositioned    Home Living Family/patient expects to be discharged to:: Private residence Living Arrangements: Alone Available Help at Discharge: Family;Available 24 hours/day ("as much as needed") Type of Home: House Home Access: Stairs to enter Entrance Stairs-Rails: None Home Layout: One level Home Equipment: Environmental consultant - 2 wheels;Walker - 4 wheels;Cane - single point;Shower seat;Bedside commode  Prior Function Level of Independence: Independent with assistive device(s)      Comments: Uses cane at baseline   PT Goals (current goals can now be found in the care plan section) Acute Rehab PT Goals Patient Stated Goal: Go home Progress  towards PT goals: Progressing toward goals    Frequency  Min 3X/week    PT Plan Current plan remains appropriate    Co-evaluation             End of Session Equipment Utilized During Treatment: Oxygen Activity Tolerance: Patient tolerated treatment well Patient left: in chair;with call bell/phone within reach;with chair alarm set     Time: 1100-1125 PT Time Calculation (min) (ACUTE ONLY): 25 min  Charges:  $Gait Training: 23-37 mins                    G Codes:      WYNN,CYNDI October 14, 2015, 12:12 PM  Sheran Lawless, PT [phone] 10/14/15

## 2015-09-18 NOTE — Progress Notes (Signed)
Discharge instruction reviewed with patient/family. RXs given. Patient verbalized that she doesn't need oxygen at this time from hospital to home. All questions answered at this time. Transport home by family.   Sim Boast, RN

## 2015-09-19 ENCOUNTER — Telehealth: Payer: Self-pay | Admitting: Nurse Practitioner

## 2015-09-19 DIAGNOSIS — J441 Chronic obstructive pulmonary disease with (acute) exacerbation: Secondary | ICD-10-CM | POA: Diagnosis not present

## 2015-09-19 DIAGNOSIS — I129 Hypertensive chronic kidney disease with stage 1 through stage 4 chronic kidney disease, or unspecified chronic kidney disease: Secondary | ICD-10-CM | POA: Diagnosis not present

## 2015-09-19 DIAGNOSIS — I2510 Atherosclerotic heart disease of native coronary artery without angina pectoris: Secondary | ICD-10-CM | POA: Diagnosis not present

## 2015-09-19 DIAGNOSIS — N189 Chronic kidney disease, unspecified: Secondary | ICD-10-CM | POA: Diagnosis not present

## 2015-09-19 DIAGNOSIS — I5032 Chronic diastolic (congestive) heart failure: Secondary | ICD-10-CM | POA: Diagnosis not present

## 2015-09-19 DIAGNOSIS — I482 Chronic atrial fibrillation: Secondary | ICD-10-CM | POA: Diagnosis not present

## 2015-09-19 NOTE — Telephone Encounter (Signed)
Appointment given for Wed 9/28 with [facility].

## 2015-09-23 ENCOUNTER — Encounter: Payer: Medicare Other | Admitting: Pharmacist Clinician (PhC)/ Clinical Pharmacy Specialist

## 2015-09-23 DIAGNOSIS — N189 Chronic kidney disease, unspecified: Secondary | ICD-10-CM | POA: Diagnosis not present

## 2015-09-23 DIAGNOSIS — J441 Chronic obstructive pulmonary disease with (acute) exacerbation: Secondary | ICD-10-CM | POA: Diagnosis not present

## 2015-09-23 DIAGNOSIS — I129 Hypertensive chronic kidney disease with stage 1 through stage 4 chronic kidney disease, or unspecified chronic kidney disease: Secondary | ICD-10-CM | POA: Diagnosis not present

## 2015-09-23 DIAGNOSIS — I5032 Chronic diastolic (congestive) heart failure: Secondary | ICD-10-CM | POA: Diagnosis not present

## 2015-09-23 DIAGNOSIS — I2510 Atherosclerotic heart disease of native coronary artery without angina pectoris: Secondary | ICD-10-CM | POA: Diagnosis not present

## 2015-09-23 DIAGNOSIS — I482 Chronic atrial fibrillation: Secondary | ICD-10-CM | POA: Diagnosis not present

## 2015-09-24 ENCOUNTER — Ambulatory Visit (INDEPENDENT_AMBULATORY_CARE_PROVIDER_SITE_OTHER): Payer: Medicare Other | Admitting: Family Medicine

## 2015-09-24 VITALS — BP 152/93 | HR 106 | Temp 97.3°F | Ht 60.0 in | Wt 154.6 lb

## 2015-09-24 DIAGNOSIS — G9340 Encephalopathy, unspecified: Secondary | ICD-10-CM

## 2015-09-24 DIAGNOSIS — J441 Chronic obstructive pulmonary disease with (acute) exacerbation: Secondary | ICD-10-CM

## 2015-09-24 DIAGNOSIS — Z23 Encounter for immunization: Secondary | ICD-10-CM | POA: Diagnosis not present

## 2015-09-24 DIAGNOSIS — I25709 Atherosclerosis of coronary artery bypass graft(s), unspecified, with unspecified angina pectoris: Secondary | ICD-10-CM | POA: Diagnosis not present

## 2015-09-24 DIAGNOSIS — E039 Hypothyroidism, unspecified: Secondary | ICD-10-CM

## 2015-09-24 DIAGNOSIS — I10 Essential (primary) hypertension: Secondary | ICD-10-CM

## 2015-09-24 DIAGNOSIS — F411 Generalized anxiety disorder: Secondary | ICD-10-CM | POA: Diagnosis not present

## 2015-09-24 DIAGNOSIS — I482 Chronic atrial fibrillation, unspecified: Secondary | ICD-10-CM

## 2015-09-24 DIAGNOSIS — D72829 Elevated white blood cell count, unspecified: Secondary | ICD-10-CM

## 2015-09-24 LAB — POCT INR: INR: 1.8

## 2015-09-24 MED ORDER — METFORMIN HCL ER 500 MG PO TB24: 500.0000 mg | ORAL_TABLET | Freq: Every day | ORAL | Status: DC

## 2015-09-24 MED ORDER — BUSPIRONE HCL 7.5 MG PO TABS: 7.5000 mg | ORAL_TABLET | Freq: Two times a day (BID) | ORAL | Status: AC

## 2015-09-24 MED ORDER — DILTIAZEM HCL ER COATED BEADS 240 MG PO CP24: ORAL_CAPSULE | ORAL | Status: DC

## 2015-09-24 NOTE — Patient Instructions (Addendum)
Use oxygen all night long pluss as needed through the day.

## 2015-09-24 NOTE — Progress Notes (Signed)
Subjective:  Patient ID: [name], female    DOB: 31-Dec-1932  Age: 81 y.o. MRN: [id number]  CC: Hospitalization Follow-up and Atrial Fibrillation   HPI [name] presents for Confusion/ AMS change follow up from hospital. Patient had been admitted for 3 days starting 09/16/2015. The symptoms have resolved currently. Her daughter is with her and says that she seems to be back to baseline. The hospital record was reviewed in detail. There was no clear cause noted however COPD exacerbation was suspected. Patient currently denies shortness of breath. However her pulse ox is noted to drop into the 80s. She says she uses oxygen sometimes at home. She is not using it at night. She is concerned that it might come off during the night. Additionally she continues to smoke one half pack of cigarettes daily. Patient states she is using her Advair and Spiriva when necessary rather than on schedule.   Arthritis in back. Has been on hydrocodone at least 5 years. This has been for chronic pain of the lower back caused by spinal stenosis and arthritis. Additionally she has a GI history of stricture and dysmotility which has limited the use of NSAIDs.   Patient in for follow-up of atrial fibrillation. Patient denies any recent bouts of chest pain. She does admit to having had palpitations. Additionally, patient is taking anticoagulants. Patient denies any recent excessive bleeding episodes including epistaxis, bleeding from the gums, genitalia, rectal bleeding or hematuria. Additionally there has been no excessive bruising.   Patient has been noted to have elevated blood sugar on multiple occasions. Additionally she had an A1c of 6.4 recently. She does not have any polyuria or polydipsia. She does have a positive family history for diabetes. She is not taking medication for her condition of borderline diabetes.   follow-up of hypertension. Patient has no history of headache chest pain or shortness of  breath or recent cough. Patient also denies symptoms of TIA such as numbness weakness lateralizing. Patient checks  blood pressure at home and has not had any elevated readings recently. Patient denies side effects from his medication. States taking it regularly.  On her problem list is noted that she has a history of hypothyroidism. It is unclear what the nature of that condition is as she is apparently not taking medication for that. Medication is not noted in her medication list and she does not recall taking that medication nor does her daughter in attendance today. Therefore thyroid function will be tested to determine if it is contributing to her atrial fibrillation and or mental status changes.    History Oluwanifemi has a past medical history of Hypertension; CAD (coronary artery disease) (5/96); Edema; Hyperlipidemia; Chronic atrial fibrillation; Osteopenia; Renal insufficiency; Closed right ankle fracture; CHF (congestive heart failure); LBP (low back pain); Arthritis; Cataract; COPD (chronic obstructive pulmonary disease); Esophageal stricture; Esophageal dysmotility (09/2011); and Carotid artery occlusion.   She has past surgical history that includes HEART STENT; mole removed (2013); Esophagogastroduodenoscopy (2012, 05/2013,09/2013); and Esophagogastroduodenoscopy (N/A, 10/18/2013).   Her family history includes Cancer in her sister; Heart disease in her father; Hyperlipidemia in her father, mother, and sister; Hypertension in her mother and sister. There is no history of Colon cancer, Esophageal cancer, Stomach cancer, or Rectal cancer.She reports that she has been smoking Cigarettes.  She has been smoking about 0.50 packs per day. She has never used smokeless tobacco. She reports that she does not drink alcohol or use illicit drugs.  Outpatient Prescriptions Prior to Visit  Medication Sig Dispense Refill  . alendronate (FOSAMAX) 70 MG tablet Take 1 tablet (70 mg total) by mouth once a week.  Take with a full glass of water on an empty stomach. 12 tablet 1  . atorvastatin (LIPITOR) 40 MG tablet TAKE 1 TABLET (40 MG TOTAL) BY MOUTH DAILY. 90 tablet 1  . Cholecalciferol (VITAMIN D) 2000 UNITS tablet Take 2,000 Units by mouth daily.      . citalopram (CELEXA) 20 MG tablet Take 1 tablet (20 mg total) by mouth daily. 30 tablet 5  . dexlansoprazole (DEXILANT) 60 MG capsule TAKE 1 CAPSULE (60 MG TOTAL) BY MOUTH DAILY. 90 capsule 1  . feeding supplement, ENSURE ENLIVE, (ENSURE ENLIVE) LIQD Take 237 mLs by mouth daily. 60 Bottle 0  . Fluticasone-Salmeterol (ADVAIR DISKUS) 250-50 MCG/DOSE AEPB Inhale 1 puff into the lungs 2 (two) times daily. 60 each 5  . HYDROcodone-acetaminophen (NORCO/VICODIN) 5-325 MG per tablet Take 1 tablet by mouth 2 (two) times daily as needed for moderate pain. (Patient taking differently: Take 1 tablet by mouth every 6 (six) hours as needed for moderate pain. ) 60 tablet 0  . isosorbide mononitrate (IMDUR) 30 MG 24 hr tablet Take 1 tablet (30 mg total) by mouth daily. 90 tablet 1  . metoprolol (LOPRESSOR) 50 MG tablet Take 1 tablet (50 mg total) by mouth 2 (two) times daily. 180 tablet 1  . nitroGLYCERIN (NITROSTAT) 0.4 MG SL tablet Place 1 tablet (0.4 mg total) under the tongue every 5 (five) minutes as needed for chest pain. 25 tablet 6  . potassium chloride SA (KLOR-CON M20) 20 MEQ tablet Take 1 tablet (20 mEq total) by mouth daily. 90 tablet 1  . predniSONE (DELTASONE) 10 MG tablet Take 4 tablets (40 mg) daily for 2 days, then, Take 3 tablets (30 mg) daily for 2 days, then, Take 2 tablets (20 mg) daily for 2 days, then, Take 1 tablets (10 mg) daily for 1 days, then stop 19 tablet 0  . tiotropium (SPIRIVA HANDIHALER) 18 MCG inhalation capsule PLACE 1 CAPSULE (18 MCG TOTAL) INTO INHALER AND INHALE DAILY. 90 capsule 1  . torsemide (DEMADEX) 20 MG tablet Take 1 tablet (20 mg total) by mouth once. Take one tablet by mouth if weight is 160 pounds or above 90 tablet 1  .  warfarin (COUMADIN) 5 MG tablet Take 0.5-1 tablets (2.5-5 mg total) by mouth See admin instructions. Takes 1 tablet (26m) every Tuesdays and Fridays and 1/2 tablet (2.5 mg) all other days. 60 tablet 5  . aspirin EC 81 MG EC tablet Take 1 tablet (81 mg total) by mouth daily. 60 tablet 0  . busPIRone (BUSPAR) 15 MG tablet TAKE 1 TABLET (15 MG TOTAL) BY MOUTH EVERY 8 (EIGHT) HOURS AS NEEDED. (Patient taking differently: Take 15 mg by mouth 3 (three) times daily as needed (anxiety). TAKE 1 TABLET (15 MG TOTAL) BY MOUTH EVERY 8 (EIGHT) HOURS AS NEEDED.) 90 tablet 1  . diltiazem (CARDIZEM CD) 180 MG 24 hr capsule TAKE 1 CAPSULE (180 MG TOTAL) BY MOUTH DAILY. 90 capsule 1   No facility-administered medications prior to visit.    ROS Review of Systems  Constitutional: Negative for fever, chills, diaphoresis, appetite change, fatigue and unexpected weight change.  HENT: Negative for congestion, ear pain, hearing loss, postnasal drip, rhinorrhea, sneezing, sore throat and trouble swallowing.   Eyes: Negative for pain.  Respiratory: Negative for cough, chest tightness and shortness of breath.   Cardiovascular: Negative for chest pain and palpitations.  Gastrointestinal: Negative for nausea, vomiting, abdominal pain, diarrhea and constipation.  Genitourinary: Negative for dysuria, frequency and menstrual problem.  Musculoskeletal: Negative for joint swelling and arthralgias.  Skin: Negative for rash.  Neurological: Negative for dizziness, weakness, numbness and headaches.  Psychiatric/Behavioral: Negative for dysphoric mood and agitation.    Objective:  BP 152/93 mmHg  Pulse 106  Temp(Src) 97.3 F (36.3 C) (Oral)  Ht 5' (1.524 m)  Wt 154 lb 9.6 oz (70.126 kg)  BMI 30.19 kg/m2  SpO2 89%  LMP 03/27/1983  BP Readings from Last 3 Encounters:  09/24/15 152/93  09/18/15 155/86  09/16/15 144/88    Wt Readings from Last 3 Encounters:  09/24/15 154 lb 9.6 oz (70.126 kg)  09/16/15 160 lb (72.576  kg)  09/16/15 156 lb 12.8 oz (71.124 kg)     Physical Exam  Constitutional: She is oriented to person, place, and time. She appears well-developed and well-nourished. No distress.  HENT:  Head: Normocephalic and atraumatic.  Right Ear: External ear normal.  Left Ear: External ear normal.  Nose: Nose normal.  Mouth/Throat: Oropharynx is clear and moist.  Eyes: Conjunctivae and EOM are normal. Pupils are equal, round, and reactive to light.  Neck: Normal range of motion. Neck supple. No thyromegaly present.  Cardiovascular: Normal rate, regular rhythm and normal heart sounds.   No murmur heard. Pulmonary/Chest: Effort normal. No respiratory distress. She has no wheezes. She has no rales.  Breath sounds distant. Perhaps faint rhonchi.  Abdominal: Soft. Bowel sounds are normal. She exhibits no distension. There is no tenderness.  Musculoskeletal: Normal range of motion. She exhibits no edema.  Lymphadenopathy:    She has no cervical adenopathy.  Neurological: She is alert and oriented to person, place, and time. She has normal reflexes.  Skin: Skin is warm and dry.  Psychiatric: She has a normal mood and affect.    Lab Results  Component Value Date   HGBA1C 6.4* 09/17/2015   HGBA1C 6.2 07/05/2013    Lab Results  Component Value Date   WBC 14.6* 09/24/2015   HGB 16.2* 09/17/2015   HCT 51.0* 09/24/2015   PLT 196 09/17/2015   GLUCOSE 168* 09/24/2015   CHOL 147 09/17/2015   TRIG 91 09/17/2015   HDL 46 09/17/2015   LDLCALC 83 09/17/2015   ALT 10 09/24/2015   AST 17 09/24/2015   NA 141 09/24/2015   K 5.3* 09/24/2015   CL 94* 09/24/2015   CREATININE 1.68* 09/24/2015   BUN 27 09/24/2015   CO2 30* 09/24/2015   TSH 0.332* 09/17/2015   INR 1.8 09/24/2015   HGBA1C 6.4* 09/17/2015    Mr Brain Wo Contrast  09/17/2015   CLINICAL DATA:  Acute encephalopathy. Numbness and progressively worsening confusion. Slurred speech. Generalized weakness.  EXAM: MRI HEAD WITHOUT CONTRAST   TECHNIQUE: Multiplanar, multiecho pulse sequences of the brain and surrounding structures were obtained without intravenous contrast.  COMPARISON:  09/16/2015 head CT.  08/13/2008 carotid ultrasound.  FINDINGS: There is no evidence of acute infarct, intracranial hemorrhage, mass, midline shift, or extra-axial fluid collection. Chronic left frontal and left parieto-occipital cortical infarcts and a chronic right basal ganglia infarct are again noted. There is mild global cerebral atrophy.  Patchy T2 hyperintensities in the cerebral white matter bilaterally separate from the areas of chronic infarction are nonspecific but compatible with moderate chronic small vessel ischemic disease. Mild chronic small vessel ischemic changes noted in the brainstem.  Orbits are unremarkable. Paranasal sinuses are clear. There is a trace left  mastoid effusion. Abnormal appearance of the distal left internal carotid artery is consistent with previously demonstrated occlusion. Other major intracranial vascular flow voids are preserved.  IMPRESSION: 1. No acute intracranial abnormality. 2. Moderate chronic small vessel ischemic disease in the cerebral white matter. 3. 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 4. Chronically occluded left internal carotid artery.   Electronically Signed   By: Logan Bores M.D.   On: 09/17/2015 10:35    Assessment & Plan:   Ladonya was seen today for hospitalization follow-up and atrial fibrillation.  Diagnoses and all orders for this visit:  Chronic atrial fibrillation -     CBC with Differential/Platelet -     CMP14+EGFR -     POCT INR  Encounter for immunization  Hypothyroidism, unspecified hypothyroidism type -     CBC with Differential/Platelet -     CMP14+EGFR -     POCT INR -     T4, free  COPD exacerbation -     CBC with Differential/Platelet -     CMP14+EGFR -     POCT INR  Acute encephalopathy -     CBC with Differential/Platelet -     CMP14+EGFR -      POCT INR  GAD (generalized anxiety disorder) -     busPIRone (BUSPAR) 7.5 MG tablet; Take 1 tablet (7.5 mg total) by mouth 2 (two) times daily. . -     CBC with Differential/Platelet -     CMP14+EGFR -     POCT INR  Coronary artery disease involving coronary bypass graft with unspecified angina pectoris -     diltiazem (CARDIZEM CD) 240 MG 24 hr capsule; TAKE 1 CAPSULE (180 MG TOTAL) BY MOUTH DAILY.  Elevated WBC count -     POCT urinalysis dipstick; Future -     POCT UA - Microscopic Only; Future -     CBC with Differential/Platelet; Future -     Urine culture; Future  Essential hypertension, benign  Other orders -     Flu Vaccine QUAD 36+ mos IM -     metFORMIN (GLUCOPHAGE-XR) 500 MG 24 hr tablet; Take 1 tablet (500 mg total) by mouth daily with breakfast. -     Fluticasone Furoate-Vilanterol (BREO ELLIPTA) 200-25 MCG/INH AEPB; Inhale 1 puff into the lungs daily.   I have discontinued Ms. Schalk aspirin. I have also changed her busPIRone and diltiazem. Additionally, I am having her start on metFORMIN and Fluticasone Furoate-Vilanterol. Lastly, I am having her maintain her Vitamin D, nitroGLYCERIN, Fluticasone-Salmeterol, HYDROcodone-acetaminophen, citalopram, torsemide, warfarin, tiotropium, potassium chloride SA, metoprolol, isosorbide mononitrate, dexlansoprazole, atorvastatin, alendronate, feeding supplement (ENSURE ENLIVE), and predniSONE.  Meds ordered this encounter  Medications  . busPIRone (BUSPAR) 7.5 MG tablet    Sig: Take 1 tablet (7.5 mg total) by mouth 2 (two) times daily. .    Dispense:  60 tablet    Refill:  5  . metFORMIN (GLUCOPHAGE-XR) 500 MG 24 hr tablet    Sig: Take 1 tablet (500 mg total) by mouth daily with breakfast.    Dispense:  30 tablet    Refill:  2  . diltiazem (CARDIZEM CD) 240 MG 24 hr capsule    Sig: TAKE 1 CAPSULE (180 MG TOTAL) BY MOUTH DAILY.    Dispense:  30 capsule    Refill:  2  . Fluticasone Furoate-Vilanterol (BREO ELLIPTA) 200-25  MCG/INH AEPB    Sig: Inhale 1 puff into the lungs daily.    Dispense:  1 each  Refill:  11   Elevated blood pressure to be treated with increase of the Cardizem CD. That should also help bring her heart rate down. The new onset of diabetes be treated with Glucophage X are as noted above. Flu shot given. Monitor for signs of increasing dementia or mental status change.  Follow-up: Return in about 1 month (around 10/24/2015) for diabetes atrial fibrillation reevaluate mental status, COPD.  Claretta Fraise, M.D.

## 2015-09-25 ENCOUNTER — Telehealth: Payer: Self-pay | Admitting: Nurse Practitioner

## 2015-09-25 ENCOUNTER — Telehealth: Payer: Self-pay | Admitting: *Deleted

## 2015-09-25 DIAGNOSIS — I129 Hypertensive chronic kidney disease with stage 1 through stage 4 chronic kidney disease, or unspecified chronic kidney disease: Secondary | ICD-10-CM | POA: Diagnosis not present

## 2015-09-25 DIAGNOSIS — I482 Chronic atrial fibrillation: Secondary | ICD-10-CM | POA: Diagnosis not present

## 2015-09-25 DIAGNOSIS — J441 Chronic obstructive pulmonary disease with (acute) exacerbation: Secondary | ICD-10-CM | POA: Diagnosis not present

## 2015-09-25 DIAGNOSIS — I5032 Chronic diastolic (congestive) heart failure: Secondary | ICD-10-CM | POA: Diagnosis not present

## 2015-09-25 DIAGNOSIS — N189 Chronic kidney disease, unspecified: Secondary | ICD-10-CM | POA: Diagnosis not present

## 2015-09-25 DIAGNOSIS — I2510 Atherosclerotic heart disease of native coronary artery without angina pectoris: Secondary | ICD-10-CM | POA: Diagnosis not present

## 2015-09-25 LAB — CMP14+EGFR
ALT: 10 IU/L (ref 0–32)
AST: 17 IU/L (ref 0–40)
Albumin/Globulin Ratio: 1.6 (ref 1.1–2.5)
Albumin: 3.9 g/dL (ref 3.5–4.7)
Alkaline Phosphatase: 49 IU/L (ref 39–117)
BUN/Creatinine Ratio: 16 (ref 11–26)
BUN: 27 mg/dL (ref 8–27)
Bilirubin Total: 0.3 mg/dL (ref 0.0–1.2)
CO2: 30 mmol/L — ABNORMAL HIGH (ref 18–29)
Calcium: 9.3 mg/dL (ref 8.7–10.3)
Chloride: 94 mmol/L — ABNORMAL LOW (ref 97–108)
Creatinine, Ser: 1.68 mg/dL — ABNORMAL HIGH (ref 0.57–1.00)
GFR calc Af Amer: 33 mL/min/{1.73_m2} — ABNORMAL LOW (ref >59)
GFR calc non Af Amer: 28 mL/min/{1.73_m2} — ABNORMAL LOW (ref >59)
Globulin, Total: 2.4 g/dL (ref 1.5–4.5)
Glucose: 168 mg/dL — ABNORMAL HIGH (ref 65–99)
Potassium: 5.3 mmol/L — ABNORMAL HIGH (ref 3.5–5.2)
Sodium: 141 mmol/L (ref 134–144)
Total Protein: 6.3 g/dL (ref 6.0–8.5)

## 2015-09-25 LAB — CBC WITH DIFFERENTIAL/PLATELET
Basophils Absolute: 0 10*3/uL (ref 0.0–0.2)
Basos: 0 %
EOS (ABSOLUTE): 0 10*3/uL (ref 0.0–0.4)
Eos: 0 %
Hematocrit: 51 % — ABNORMAL HIGH (ref 34.0–46.6)
Hemoglobin: 17.2 g/dL — ABNORMAL HIGH (ref 11.1–15.9)
Immature Grans (Abs): 0.1 10*3/uL (ref 0.0–0.1)
Immature Granulocytes: 1 %
Lymphocytes Absolute: 0.6 10*3/uL — ABNORMAL LOW (ref 0.7–3.1)
Lymphs: 4 %
MCH: 30.8 pg (ref 26.6–33.0)
MCHC: 33.7 g/dL (ref 31.5–35.7)
MCV: 91 fL (ref 79–97)
Monocytes Absolute: 0.4 10*3/uL (ref 0.1–0.9)
Monocytes: 3 %
Neutrophils Absolute: 13.4 10*3/uL — ABNORMAL HIGH (ref 1.4–7.0)
Neutrophils: 92 %
Platelets: 241 10*3/uL (ref 150–379)
RBC: 5.58 x10E6/uL — ABNORMAL HIGH (ref 3.77–5.28)
RDW: 14.5 % (ref 12.3–15.4)
WBC: 14.6 10*3/uL — ABNORMAL HIGH (ref 3.4–10.8)

## 2015-09-25 LAB — T4, FREE: Free T4: 1.54 ng/dL (ref 0.82–1.77)

## 2015-09-25 NOTE — Telephone Encounter (Signed)
-----   Message from Mechele Claude, MD sent at 09/25/2015  1:10 PM EDT ----- Called to check on patient please. Blood work looked good but the white count was rather high. This could indicate infection. Most commonly bladder infection.. If she is having any related symptoms, please send in Cipro 250 mg 1 by mouth twice a day 7 days. If she is not having symptoms, have her drop by for a repeat of the CBC and a urinalysis with culture. Thanks, W [employer]

## 2015-09-25 NOTE — Telephone Encounter (Signed)
Patient does not have any issues with bladder at this time.  She will call back to talk with nurse if any changes.  Future lab orders were added for a recheck.

## 2015-09-26 ENCOUNTER — Telehealth: Payer: Self-pay | Admitting: Nurse Practitioner

## 2015-09-26 DIAGNOSIS — I5032 Chronic diastolic (congestive) heart failure: Secondary | ICD-10-CM | POA: Diagnosis not present

## 2015-09-26 DIAGNOSIS — I129 Hypertensive chronic kidney disease with stage 1 through stage 4 chronic kidney disease, or unspecified chronic kidney disease: Secondary | ICD-10-CM | POA: Diagnosis not present

## 2015-09-26 DIAGNOSIS — I482 Chronic atrial fibrillation: Secondary | ICD-10-CM | POA: Diagnosis not present

## 2015-09-26 DIAGNOSIS — N189 Chronic kidney disease, unspecified: Secondary | ICD-10-CM | POA: Diagnosis not present

## 2015-09-26 DIAGNOSIS — E1159 Type 2 diabetes mellitus with other circulatory complications: Secondary | ICD-10-CM

## 2015-09-26 DIAGNOSIS — J441 Chronic obstructive pulmonary disease with (acute) exacerbation: Secondary | ICD-10-CM | POA: Diagnosis not present

## 2015-09-26 DIAGNOSIS — Z794 Long term (current) use of insulin: Secondary | ICD-10-CM

## 2015-09-26 DIAGNOSIS — I2510 Atherosclerotic heart disease of native coronary artery without angina pectoris: Secondary | ICD-10-CM | POA: Diagnosis not present

## 2015-09-26 MED ORDER — CIPROFLOXACIN HCL 250 MG PO TABS: 250.0000 mg | ORAL_TABLET | Freq: Two times a day (BID) | ORAL | Status: DC

## 2015-09-26 MED ORDER — FLUTICASONE FUROATE-VILANTEROL 200-25 MCG/INH IN AEPB: 1.0000 | INHALATION_SPRAY | Freq: Every day | RESPIRATORY_TRACT | Status: DC

## 2015-09-26 NOTE — Telephone Encounter (Signed)
When she was here 2 days ago I asked her to increased her diltiazem to 240 mg daily. Please verify that that has been done. If it has, then we should increase again to 300 mg daily. Please send in that prescription.

## 2015-09-26 NOTE — Telephone Encounter (Signed)
Home Health calling, Pt's BP's were 178/104 and 220/120 this morning. Should home health's parameters for calling us if her BP's are over 160/90 be increased due to her kidney disease? Also need to send in glucose meter and strips sent to [facility] in [place]

## 2015-09-26 NOTE — Telephone Encounter (Signed)
Patient spoke with nurse on Thursday evening.

## 2015-09-29 ENCOUNTER — Other Ambulatory Visit: Payer: Self-pay | Admitting: Nurse Practitioner

## 2015-09-29 DIAGNOSIS — I5032 Chronic diastolic (congestive) heart failure: Secondary | ICD-10-CM | POA: Diagnosis not present

## 2015-09-29 DIAGNOSIS — I2510 Atherosclerotic heart disease of native coronary artery without angina pectoris: Secondary | ICD-10-CM | POA: Diagnosis not present

## 2015-09-29 DIAGNOSIS — Z9181 History of falling: Secondary | ICD-10-CM | POA: Diagnosis not present

## 2015-09-29 DIAGNOSIS — Z9981 Dependence on supplemental oxygen: Secondary | ICD-10-CM | POA: Diagnosis not present

## 2015-09-29 DIAGNOSIS — I482 Chronic atrial fibrillation: Secondary | ICD-10-CM | POA: Diagnosis not present

## 2015-09-29 DIAGNOSIS — N189 Chronic kidney disease, unspecified: Secondary | ICD-10-CM | POA: Diagnosis not present

## 2015-09-29 DIAGNOSIS — I130 Hypertensive heart and chronic kidney disease with heart failure and stage 1 through stage 4 chronic kidney disease, or unspecified chronic kidney disease: Secondary | ICD-10-CM | POA: Diagnosis not present

## 2015-09-29 DIAGNOSIS — E785 Hyperlipidemia, unspecified: Secondary | ICD-10-CM | POA: Diagnosis not present

## 2015-09-29 DIAGNOSIS — J449 Chronic obstructive pulmonary disease, unspecified: Secondary | ICD-10-CM | POA: Diagnosis not present

## 2015-09-29 DIAGNOSIS — M1990 Unspecified osteoarthritis, unspecified site: Secondary | ICD-10-CM | POA: Diagnosis not present

## 2015-09-29 MED ORDER — DILTIAZEM HCL ER COATED BEADS 300 MG PO CP24: 300.0000 mg | ORAL_CAPSULE | Freq: Every day | ORAL | Status: DC

## 2015-09-29 NOTE — Progress Notes (Signed)
Pt aware Rx was sent into pharmacy

## 2015-09-29 NOTE — Telephone Encounter (Signed)
Pt did start diltiazem 240 on 09/24/15. BP this am is 200/120.  See [facility] note, should it be increased to , if so send in Rx

## 2015-09-30 ENCOUNTER — Encounter: Payer: Self-pay | Admitting: Nurse Practitioner

## 2015-09-30 ENCOUNTER — Ambulatory Visit (INDEPENDENT_AMBULATORY_CARE_PROVIDER_SITE_OTHER): Payer: Medicare Other | Admitting: Nurse Practitioner

## 2015-09-30 VITALS — BP 159/101 | HR 91 | Temp 97.0°F | Ht 60.0 in | Wt 158.6 lb

## 2015-09-30 DIAGNOSIS — I130 Hypertensive heart and chronic kidney disease with heart failure and stage 1 through stage 4 chronic kidney disease, or unspecified chronic kidney disease: Secondary | ICD-10-CM | POA: Diagnosis not present

## 2015-09-30 DIAGNOSIS — N189 Chronic kidney disease, unspecified: Secondary | ICD-10-CM | POA: Diagnosis not present

## 2015-09-30 DIAGNOSIS — I2510 Atherosclerotic heart disease of native coronary artery without angina pectoris: Secondary | ICD-10-CM | POA: Diagnosis not present

## 2015-09-30 DIAGNOSIS — E785 Hyperlipidemia, unspecified: Secondary | ICD-10-CM | POA: Diagnosis not present

## 2015-09-30 DIAGNOSIS — N3000 Acute cystitis without hematuria: Secondary | ICD-10-CM

## 2015-09-30 DIAGNOSIS — J449 Chronic obstructive pulmonary disease, unspecified: Secondary | ICD-10-CM | POA: Diagnosis not present

## 2015-09-30 DIAGNOSIS — I482 Chronic atrial fibrillation, unspecified: Secondary | ICD-10-CM

## 2015-09-30 DIAGNOSIS — Z9181 History of falling: Secondary | ICD-10-CM | POA: Diagnosis not present

## 2015-09-30 DIAGNOSIS — I5032 Chronic diastolic (congestive) heart failure: Secondary | ICD-10-CM | POA: Diagnosis not present

## 2015-09-30 DIAGNOSIS — D72829 Elevated white blood cell count, unspecified: Secondary | ICD-10-CM

## 2015-09-30 DIAGNOSIS — I10 Essential (primary) hypertension: Secondary | ICD-10-CM | POA: Diagnosis not present

## 2015-09-30 DIAGNOSIS — F411 Generalized anxiety disorder: Secondary | ICD-10-CM

## 2015-09-30 DIAGNOSIS — Z9981 Dependence on supplemental oxygen: Secondary | ICD-10-CM | POA: Diagnosis not present

## 2015-09-30 DIAGNOSIS — M1990 Unspecified osteoarthritis, unspecified site: Secondary | ICD-10-CM | POA: Diagnosis not present

## 2015-09-30 LAB — POCT URINALYSIS DIPSTICK
Bilirubin, UA: NEGATIVE
Glucose, UA: NEGATIVE
Ketones, UA: NEGATIVE
Nitrite, UA: NEGATIVE
Spec Grav, UA: 1.01
Urobilinogen, UA: NEGATIVE
pH, UA: 7.5

## 2015-09-30 LAB — POCT UA - MICROSCOPIC ONLY
Bacteria, U Microscopic: NEGATIVE
Casts, Ur, LPF, POC: NEGATIVE
Crystals, Ur, HPF, POC: NEGATIVE
Yeast, UA: NEGATIVE

## 2015-09-30 LAB — POCT INR: INR: 1.6

## 2015-09-30 MED ORDER — CITALOPRAM HYDROBROMIDE 40 MG PO TABS: 40.0000 mg | ORAL_TABLET | Freq: Every day | ORAL | Status: DC

## 2015-09-30 NOTE — Progress Notes (Signed)
   Subjective:    Patient ID: [name], female    DOB: [birth year], 81 y.o.   MRN: [id number]  HPI Patient was seen 09/16/15 for regular follow up- was doing ok at that time- later in evening she was found at her house very confused- she was taken to ER and admitted for altered mental status and UTI- She was sen for hospital follow up last week by Dr. Darlyn Read and there is some confusion about what meds she is suppose to be on. Here to discuss meds- also INR was done and was 1.8 and was nt addressed at appointment- will repeat today.  * Has 4 days left on cipro for UTI * Dr. Darlyn Read started patient on metformin because hgba1c was 6.4 in hospital. Blood sugars have been running around 160 at home.   Review of Systems  Constitutional: Positive for fatigue. Negative for fever.  HENT: Negative.   Respiratory: Negative.   Cardiovascular: Negative.   Gastrointestinal: Negative.   Genitourinary: Negative.   Neurological: Positive for weakness.  Psychiatric/Behavioral: Negative.   All other systems reviewed and are negative.      Objective:   Physical Exam  Constitutional: She is oriented to person, place, and time. She appears well-developed and well-nourished.  Cardiovascular: Normal rate, regular rhythm and normal heart sounds.   Pulmonary/Chest: Effort normal and breath sounds normal.  Musculoskeletal: She exhibits edema (mild bil lower ext edema).  Gait slow and steady with cane  Neurological: She is alert and oriented to person, place, and time.  Skin: There is pallor.  Psychiatric: She has a normal mood and affect. Her behavior is normal. Judgment and thought content normal.   BP 159/101 mmHg  Pulse 91  Temp(Src) 97 F (36.1 C) (Oral)  Ht 5' (1.524 m)  Wt 158 lb 9.6 oz (71.94 kg)  BMI 30.97 kg/m2  LMP 03/27/1983        Assessment & Plan:  1. Acute cystitis without hematuria Patient will bring urine back - POCT UA - Microscopic Only - POCT urinalysis dipstick  2.  Chronic atrial fibrillation (HCC) See anticoag note- chnaged coumadin- patient understands change - POCT INR  3. Essential hypertension, benign Do not add slat to diet Increased diltiazem to  daily- rx ready for oick up at pharmacy  4. GAD (generalized anxiety disorder) Stress management buspar 7.5 mg bid as rx by Dr. Darlyn Read  Follow up next week for INR  Mary-Margaret Daphine Deutscher, [name]

## 2015-09-30 NOTE — Patient Instructions (Signed)
Anticoagulation Dose Instructions as of 09/30/2015      [name] Tue Wed Thu Fri Sat   New Dose 2.5 mg 2.5 mg 5 mg 5 mg 5 mg 5 mg 2.5 mg   Alt Week 2.5 mg 2.5 mg 5 mg 2.5 mg 2.5 mg 5 mg 2.5 mg    Description        1 tablet daily x4 days then back to current dose of 1/2 tablet daily except whole tablet tues and fri     Recheck in 1 week

## 2015-09-30 NOTE — Addendum Note (Signed)
Addended by: Orma Render F on: 09/30/2015 04:12 PM   Modules accepted: Orders

## 2015-10-01 ENCOUNTER — Telehealth: Payer: Self-pay | Admitting: Nurse Practitioner

## 2015-10-01 DIAGNOSIS — I2510 Atherosclerotic heart disease of native coronary artery without angina pectoris: Secondary | ICD-10-CM | POA: Diagnosis not present

## 2015-10-01 DIAGNOSIS — I130 Hypertensive heart and chronic kidney disease with heart failure and stage 1 through stage 4 chronic kidney disease, or unspecified chronic kidney disease: Secondary | ICD-10-CM | POA: Diagnosis not present

## 2015-10-01 DIAGNOSIS — I482 Chronic atrial fibrillation: Secondary | ICD-10-CM | POA: Diagnosis not present

## 2015-10-01 DIAGNOSIS — I5032 Chronic diastolic (congestive) heart failure: Secondary | ICD-10-CM | POA: Diagnosis not present

## 2015-10-01 DIAGNOSIS — M1990 Unspecified osteoarthritis, unspecified site: Secondary | ICD-10-CM | POA: Diagnosis not present

## 2015-10-01 DIAGNOSIS — Z9981 Dependence on supplemental oxygen: Secondary | ICD-10-CM | POA: Diagnosis not present

## 2015-10-01 DIAGNOSIS — N189 Chronic kidney disease, unspecified: Secondary | ICD-10-CM | POA: Diagnosis not present

## 2015-10-01 DIAGNOSIS — E785 Hyperlipidemia, unspecified: Secondary | ICD-10-CM | POA: Diagnosis not present

## 2015-10-01 DIAGNOSIS — J449 Chronic obstructive pulmonary disease, unspecified: Secondary | ICD-10-CM | POA: Diagnosis not present

## 2015-10-01 DIAGNOSIS — Z9181 History of falling: Secondary | ICD-10-CM | POA: Diagnosis not present

## 2015-10-01 NOTE — Telephone Encounter (Signed)
Appt given for next Tuesday per patients daughter

## 2015-10-02 DIAGNOSIS — Z9981 Dependence on supplemental oxygen: Secondary | ICD-10-CM | POA: Diagnosis not present

## 2015-10-02 DIAGNOSIS — Z9181 History of falling: Secondary | ICD-10-CM | POA: Diagnosis not present

## 2015-10-02 DIAGNOSIS — I130 Hypertensive heart and chronic kidney disease with heart failure and stage 1 through stage 4 chronic kidney disease, or unspecified chronic kidney disease: Secondary | ICD-10-CM | POA: Diagnosis not present

## 2015-10-02 DIAGNOSIS — J449 Chronic obstructive pulmonary disease, unspecified: Secondary | ICD-10-CM | POA: Diagnosis not present

## 2015-10-02 DIAGNOSIS — I2510 Atherosclerotic heart disease of native coronary artery without angina pectoris: Secondary | ICD-10-CM | POA: Diagnosis not present

## 2015-10-02 DIAGNOSIS — M1990 Unspecified osteoarthritis, unspecified site: Secondary | ICD-10-CM | POA: Diagnosis not present

## 2015-10-02 DIAGNOSIS — E785 Hyperlipidemia, unspecified: Secondary | ICD-10-CM | POA: Diagnosis not present

## 2015-10-02 DIAGNOSIS — I5032 Chronic diastolic (congestive) heart failure: Secondary | ICD-10-CM | POA: Diagnosis not present

## 2015-10-02 DIAGNOSIS — I482 Chronic atrial fibrillation: Secondary | ICD-10-CM | POA: Diagnosis not present

## 2015-10-02 DIAGNOSIS — N189 Chronic kidney disease, unspecified: Secondary | ICD-10-CM | POA: Diagnosis not present

## 2015-10-02 LAB — URINE CULTURE: Organism ID, Bacteria: NO GROWTH

## 2015-10-02 NOTE — Telephone Encounter (Signed)
Dr. Darlyn Read nurse called patient.

## 2015-10-03 ENCOUNTER — Telehealth: Payer: Self-pay | Admitting: *Deleted

## 2015-10-03 DIAGNOSIS — I5032 Chronic diastolic (congestive) heart failure: Secondary | ICD-10-CM | POA: Diagnosis not present

## 2015-10-03 DIAGNOSIS — E785 Hyperlipidemia, unspecified: Secondary | ICD-10-CM | POA: Diagnosis not present

## 2015-10-03 DIAGNOSIS — I2510 Atherosclerotic heart disease of native coronary artery without angina pectoris: Secondary | ICD-10-CM | POA: Diagnosis not present

## 2015-10-03 DIAGNOSIS — I482 Chronic atrial fibrillation: Secondary | ICD-10-CM | POA: Diagnosis not present

## 2015-10-03 DIAGNOSIS — J449 Chronic obstructive pulmonary disease, unspecified: Secondary | ICD-10-CM | POA: Diagnosis not present

## 2015-10-03 DIAGNOSIS — M1990 Unspecified osteoarthritis, unspecified site: Secondary | ICD-10-CM | POA: Diagnosis not present

## 2015-10-03 DIAGNOSIS — I130 Hypertensive heart and chronic kidney disease with heart failure and stage 1 through stage 4 chronic kidney disease, or unspecified chronic kidney disease: Secondary | ICD-10-CM | POA: Diagnosis not present

## 2015-10-03 DIAGNOSIS — N189 Chronic kidney disease, unspecified: Secondary | ICD-10-CM | POA: Diagnosis not present

## 2015-10-03 DIAGNOSIS — Z9981 Dependence on supplemental oxygen: Secondary | ICD-10-CM | POA: Diagnosis not present

## 2015-10-03 DIAGNOSIS — Z9181 History of falling: Secondary | ICD-10-CM | POA: Diagnosis not present

## 2015-10-03 NOTE — Telephone Encounter (Signed)
BP today was 172/100 after 3 days on diltiazem 

## 2015-10-04 DIAGNOSIS — J449 Chronic obstructive pulmonary disease, unspecified: Secondary | ICD-10-CM | POA: Diagnosis not present

## 2015-10-04 DIAGNOSIS — M810 Age-related osteoporosis without current pathological fracture: Secondary | ICD-10-CM | POA: Diagnosis not present

## 2015-10-04 DIAGNOSIS — J188 Other pneumonia, unspecified organism: Secondary | ICD-10-CM | POA: Diagnosis not present

## 2015-10-06 ENCOUNTER — Telehealth: Payer: Self-pay | Admitting: *Deleted

## 2015-10-06 ENCOUNTER — Ambulatory Visit (INDEPENDENT_AMBULATORY_CARE_PROVIDER_SITE_OTHER): Payer: Medicare Other | Admitting: Pharmacist

## 2015-10-06 DIAGNOSIS — I2510 Atherosclerotic heart disease of native coronary artery without angina pectoris: Secondary | ICD-10-CM | POA: Diagnosis not present

## 2015-10-06 DIAGNOSIS — N189 Chronic kidney disease, unspecified: Secondary | ICD-10-CM | POA: Diagnosis not present

## 2015-10-06 DIAGNOSIS — Z9981 Dependence on supplemental oxygen: Secondary | ICD-10-CM | POA: Diagnosis not present

## 2015-10-06 DIAGNOSIS — I5032 Chronic diastolic (congestive) heart failure: Secondary | ICD-10-CM | POA: Diagnosis not present

## 2015-10-06 DIAGNOSIS — M1990 Unspecified osteoarthritis, unspecified site: Secondary | ICD-10-CM | POA: Diagnosis not present

## 2015-10-06 DIAGNOSIS — I130 Hypertensive heart and chronic kidney disease with heart failure and stage 1 through stage 4 chronic kidney disease, or unspecified chronic kidney disease: Secondary | ICD-10-CM | POA: Diagnosis not present

## 2015-10-06 DIAGNOSIS — I482 Chronic atrial fibrillation: Secondary | ICD-10-CM | POA: Diagnosis not present

## 2015-10-06 DIAGNOSIS — J449 Chronic obstructive pulmonary disease, unspecified: Secondary | ICD-10-CM | POA: Diagnosis not present

## 2015-10-06 DIAGNOSIS — Z9181 History of falling: Secondary | ICD-10-CM | POA: Diagnosis not present

## 2015-10-06 DIAGNOSIS — E785 Hyperlipidemia, unspecified: Secondary | ICD-10-CM | POA: Diagnosis not present

## 2015-10-06 LAB — POCT INR: INR: 3.1

## 2015-10-06 MED ORDER — METOPROLOL TARTRATE 100 MG PO TABS: 100.0000 mg | ORAL_TABLET | Freq: Two times a day (BID) | ORAL | Status: AC

## 2015-10-06 NOTE — Telephone Encounter (Signed)
Increased metoprolol form 50 mg 2x a day to  2x a day- rx sent to pharmacy

## 2015-10-06 NOTE — Telephone Encounter (Signed)
Weight is climbing rapidly. Is she swelling visibly? She may want to use 1/2 dose of torsemide. If weight continues to climb use full dose or come to office.

## 2015-10-06 NOTE — Telephone Encounter (Signed)
Spoke to [name] and advised of MD feedback, [name] voiced understanding and will call us back this Wednesday with an update.

## 2015-10-06 NOTE — Telephone Encounter (Signed)
INR slightly supratherapeutic - likely related to either extra tablets from last week or ABX therapy (on ciprofloxacin for UTI) which patient finished yesterday. Recommend no warfarin today - Monday, October 10th.   Then restart usual dose of warfarin of  - take 1 tablet tuesdays and fridays and 1/2 tablet all other.  Recheck INR later this week.  Patient and home health care nurse notified.

## 2015-10-06 NOTE — Telephone Encounter (Signed)
INR 3.1 Taking  on tues and fri, 2.5 mg all other days. Took extra last week, see notes

## 2015-10-06 NOTE — Telephone Encounter (Signed)
BP much better at 132/86 today! Pt has gained 8 lbs in 10 days. Her torsemide order says to take only if over 160 lbs. She is at 159.2, was 151.8 on 09/26/15. Also, low grade temp of 99-100 for a week. No UTI symptoms, slight cough with white frothy sputum.

## 2015-10-06 NOTE — Telephone Encounter (Signed)
Lets see what ot os tomorrow- but already sent rx in can cut in half

## 2015-10-06 NOTE — Telephone Encounter (Signed)
Spoke to [name] and she said [name]'s BP today was 132/86 and this was not being on the metoprolol 100BID, would you still like her to start on the new dose of metoprolol? Please advise.

## 2015-10-07 ENCOUNTER — Ambulatory Visit: Payer: Medicare Other | Admitting: Nurse Practitioner

## 2015-10-07 ENCOUNTER — Encounter: Payer: Self-pay | Admitting: Nurse Practitioner

## 2015-10-07 ENCOUNTER — Ambulatory Visit (INDEPENDENT_AMBULATORY_CARE_PROVIDER_SITE_OTHER): Payer: Medicare Other | Admitting: Nurse Practitioner

## 2015-10-07 VITALS — BP 146/82 | HR 73 | Temp 97.8°F | Ht 60.0 in | Wt 161.0 lb

## 2015-10-07 DIAGNOSIS — N3000 Acute cystitis without hematuria: Secondary | ICD-10-CM | POA: Diagnosis not present

## 2015-10-07 DIAGNOSIS — I482 Chronic atrial fibrillation: Secondary | ICD-10-CM | POA: Diagnosis not present

## 2015-10-07 DIAGNOSIS — I130 Hypertensive heart and chronic kidney disease with heart failure and stage 1 through stage 4 chronic kidney disease, or unspecified chronic kidney disease: Secondary | ICD-10-CM | POA: Diagnosis not present

## 2015-10-07 DIAGNOSIS — N189 Chronic kidney disease, unspecified: Secondary | ICD-10-CM | POA: Diagnosis not present

## 2015-10-07 DIAGNOSIS — R300 Dysuria: Secondary | ICD-10-CM | POA: Diagnosis not present

## 2015-10-07 DIAGNOSIS — E785 Hyperlipidemia, unspecified: Secondary | ICD-10-CM | POA: Diagnosis not present

## 2015-10-07 DIAGNOSIS — J449 Chronic obstructive pulmonary disease, unspecified: Secondary | ICD-10-CM | POA: Diagnosis not present

## 2015-10-07 DIAGNOSIS — Z9181 History of falling: Secondary | ICD-10-CM | POA: Diagnosis not present

## 2015-10-07 DIAGNOSIS — I5032 Chronic diastolic (congestive) heart failure: Secondary | ICD-10-CM | POA: Diagnosis not present

## 2015-10-07 DIAGNOSIS — M1990 Unspecified osteoarthritis, unspecified site: Secondary | ICD-10-CM | POA: Diagnosis not present

## 2015-10-07 DIAGNOSIS — Z9981 Dependence on supplemental oxygen: Secondary | ICD-10-CM | POA: Diagnosis not present

## 2015-10-07 DIAGNOSIS — I2510 Atherosclerotic heart disease of native coronary artery without angina pectoris: Secondary | ICD-10-CM | POA: Diagnosis not present

## 2015-10-07 LAB — POCT URINALYSIS DIPSTICK
Glucose, UA: NEGATIVE
Ketones, UA: NEGATIVE
Nitrite, UA: NEGATIVE
Spec Grav, UA: 1.025
Urobilinogen, UA: NEGATIVE
pH, UA: 6.5

## 2015-10-07 LAB — POCT UA - MICROSCOPIC ONLY
Casts, Ur, LPF, POC: NEGATIVE
Crystals, Ur, HPF, POC: NEGATIVE
Yeast, UA: NEGATIVE

## 2015-10-07 MED ORDER — FLUCONAZOLE 150 MG PO TABS: ORAL_TABLET | ORAL | Status: DC

## 2015-10-07 MED ORDER — AMOXICILLIN 875 MG PO TABS: 875.0000 mg | ORAL_TABLET | Freq: Two times a day (BID) | ORAL | Status: DC

## 2015-10-07 NOTE — Addendum Note (Signed)
Addended by: Prescott Gum on: 10/07/2015 03:42 PM   Modules accepted: Orders

## 2015-10-07 NOTE — Progress Notes (Signed)
  Subjective:     [name] is a 81 y.o. female who presents for evaluation of fever. She has had the fever for 5 days. Symptoms have been stable. Symptoms are described as low grade fevers, and are worse in the morning. Associated symptoms are constipation, cough, fatigue. Patient denies nausea, otitis symptoms, poor appetite and vomiting.  She has tried to alleviate the symptoms with acetaminophen with minimal relief. The patient has no known comorbidities (structural heart/valvular disease, prosthetic joints, immunocompromised state, recent dental work, known abscesses).  The following portions of the patient's history were reviewed and updated as appropriate: allergies, current medications, past family history, past medical history, past social history, past surgical history and problem list.  Review of Systems Pertinent items are noted in HPI.   Objective:    Head: Normocephalic, without obvious abnormality, atraumatic Eyes: conjunctivae/corneas clear. PERRL, EOM's intact. Fundi benign. Ears: normal TM's and external ear canals both ears Neck: no adenopathy, no carotid bruit, no JVD, supple, symmetrical, trachea midline and thyroid not enlarged, symmetric, no tenderness/mass/nodules Lungs: wheezes anterior - bilateral Heart: regular rate and rhythm, S1, S2 normal, no murmur, click, rub or gallop Abdomen: soft, non-tender; bowel sounds normal; no masses,  no organomegaly    Results for orders placed or performed in visit on 10/07/15  POCT urinalysis dipstick  Result Value Ref Range   Color, UA straw    Clarity, UA cloudy    Glucose, UA neg    Bilirubin, UA large    Ketones, UA neg    Spec Grav, UA 1.025    Blood, UA trace    pH, UA 6.5    Protein, UA 3+    Urobilinogen, UA negative    Nitrite, UA neg    Leukocytes, UA moderate (2+) (A) Negative  POCT UA - Microscopic Only  Result Value Ref Range   WBC, Ur, HPF, POC 30-40    RBC, urine, microscopic 5-10    Bacteria, U  Microscopic mod    Mucus, UA occ    Epithelial cells, urine per micros occ    Crystals, Ur, HPF, POC neg    Casts, Ur, LPF, POC neg    Yeast, UA neg      Assessment:    Fever is likely secondary to UTI.   Plan:       Meds ordered this encounter  Medications  . amoxicillin (AMOXIL) 875 MG tablet    Sig: Take 1 tablet (875 mg total) by mouth 2 (two) times daily. 1 po BID    Dispense:  20 tablet    Refill:  0    Order Specific Question:  Supervising Provider    Answer:  Ernestina Penna [[id number]]   Take medication as prescribe Cotton underwear Take shower not bath Cranberry juice, yogurt Force fluids AZO over the counter X2 days Culture pending RTO prn  Mary-Margaret Daphine Deutscher, [name]

## 2015-10-07 NOTE — Addendum Note (Signed)
Addended by: Bennie Pierini on: 10/07/2015 12:13 PM   Modules accepted: Orders

## 2015-10-07 NOTE — Patient Instructions (Signed)

## 2015-10-08 DIAGNOSIS — Z9981 Dependence on supplemental oxygen: Secondary | ICD-10-CM | POA: Diagnosis not present

## 2015-10-08 DIAGNOSIS — I482 Chronic atrial fibrillation: Secondary | ICD-10-CM | POA: Diagnosis not present

## 2015-10-08 DIAGNOSIS — Z9181 History of falling: Secondary | ICD-10-CM | POA: Diagnosis not present

## 2015-10-08 DIAGNOSIS — N189 Chronic kidney disease, unspecified: Secondary | ICD-10-CM | POA: Diagnosis not present

## 2015-10-08 DIAGNOSIS — I130 Hypertensive heart and chronic kidney disease with heart failure and stage 1 through stage 4 chronic kidney disease, or unspecified chronic kidney disease: Secondary | ICD-10-CM | POA: Diagnosis not present

## 2015-10-08 DIAGNOSIS — J449 Chronic obstructive pulmonary disease, unspecified: Secondary | ICD-10-CM | POA: Diagnosis not present

## 2015-10-08 DIAGNOSIS — E785 Hyperlipidemia, unspecified: Secondary | ICD-10-CM | POA: Diagnosis not present

## 2015-10-08 DIAGNOSIS — I509 Heart failure, unspecified: Secondary | ICD-10-CM | POA: Diagnosis not present

## 2015-10-08 DIAGNOSIS — M1990 Unspecified osteoarthritis, unspecified site: Secondary | ICD-10-CM | POA: Diagnosis not present

## 2015-10-08 DIAGNOSIS — I5032 Chronic diastolic (congestive) heart failure: Secondary | ICD-10-CM | POA: Diagnosis not present

## 2015-10-08 DIAGNOSIS — I2510 Atherosclerotic heart disease of native coronary artery without angina pectoris: Secondary | ICD-10-CM | POA: Diagnosis not present

## 2015-10-08 DIAGNOSIS — J188 Other pneumonia, unspecified organism: Secondary | ICD-10-CM | POA: Diagnosis not present

## 2015-10-09 ENCOUNTER — Ambulatory Visit: Payer: Medicare Other | Admitting: Nurse Practitioner

## 2015-10-09 ENCOUNTER — Emergency Department (HOSPITAL_COMMUNITY): Payer: Medicare Other

## 2015-10-09 ENCOUNTER — Inpatient Hospital Stay (HOSPITAL_COMMUNITY)
Admission: EM | Admit: 2015-10-09 | Discharge: 2015-10-14 | DRG: 190 | Disposition: A | Discharge status: Home Health | Payer: Medicare Other | Attending: Internal Medicine | Admitting: Internal Medicine

## 2015-10-09 ENCOUNTER — Encounter (HOSPITAL_COMMUNITY): Payer: Self-pay | Admitting: Emergency Medicine

## 2015-10-09 DIAGNOSIS — Z66 Do not resuscitate: Secondary | ICD-10-CM | POA: Diagnosis present

## 2015-10-09 DIAGNOSIS — J440 Chronic obstructive pulmonary disease with acute lower respiratory infection: Principal | ICD-10-CM | POA: Diagnosis present

## 2015-10-09 DIAGNOSIS — F17200 Nicotine dependence, unspecified, uncomplicated: Secondary | ICD-10-CM | POA: Diagnosis present

## 2015-10-09 DIAGNOSIS — I130 Hypertensive heart and chronic kidney disease with heart failure and stage 1 through stage 4 chronic kidney disease, or unspecified chronic kidney disease: Secondary | ICD-10-CM | POA: Diagnosis not present

## 2015-10-09 DIAGNOSIS — N184 Chronic kidney disease, stage 4 (severe): Secondary | ICD-10-CM | POA: Diagnosis not present

## 2015-10-09 DIAGNOSIS — Z7951 Long term (current) use of inhaled steroids: Secondary | ICD-10-CM

## 2015-10-09 DIAGNOSIS — Z7901 Long term (current) use of anticoagulants: Secondary | ICD-10-CM | POA: Diagnosis not present

## 2015-10-09 DIAGNOSIS — R0602 Shortness of breath: Secondary | ICD-10-CM | POA: Diagnosis not present

## 2015-10-09 DIAGNOSIS — Y95 Nosocomial condition: Secondary | ICD-10-CM | POA: Diagnosis present

## 2015-10-09 DIAGNOSIS — F329 Major depressive disorder, single episode, unspecified: Secondary | ICD-10-CM | POA: Diagnosis present

## 2015-10-09 DIAGNOSIS — Z79899 Other long term (current) drug therapy: Secondary | ICD-10-CM | POA: Diagnosis not present

## 2015-10-09 DIAGNOSIS — J449 Chronic obstructive pulmonary disease, unspecified: Secondary | ICD-10-CM | POA: Diagnosis present

## 2015-10-09 DIAGNOSIS — E1122 Type 2 diabetes mellitus with diabetic chronic kidney disease: Secondary | ICD-10-CM | POA: Diagnosis present

## 2015-10-09 DIAGNOSIS — R0600 Dyspnea, unspecified: Secondary | ICD-10-CM | POA: Diagnosis not present

## 2015-10-09 DIAGNOSIS — I4891 Unspecified atrial fibrillation: Secondary | ICD-10-CM

## 2015-10-09 DIAGNOSIS — J441 Chronic obstructive pulmonary disease with (acute) exacerbation: Secondary | ICD-10-CM | POA: Diagnosis present

## 2015-10-09 DIAGNOSIS — I2510 Atherosclerotic heart disease of native coronary artery without angina pectoris: Secondary | ICD-10-CM | POA: Diagnosis not present

## 2015-10-09 DIAGNOSIS — E785 Hyperlipidemia, unspecified: Secondary | ICD-10-CM | POA: Diagnosis present

## 2015-10-09 DIAGNOSIS — J189 Pneumonia, unspecified organism: Secondary | ICD-10-CM | POA: Diagnosis present

## 2015-10-09 DIAGNOSIS — I5032 Chronic diastolic (congestive) heart failure: Secondary | ICD-10-CM

## 2015-10-09 DIAGNOSIS — J9621 Acute and chronic respiratory failure with hypoxia: Secondary | ICD-10-CM | POA: Diagnosis not present

## 2015-10-09 DIAGNOSIS — I6529 Occlusion and stenosis of unspecified carotid artery: Secondary | ICD-10-CM | POA: Diagnosis not present

## 2015-10-09 DIAGNOSIS — Z7983 Long term (current) use of bisphosphonates: Secondary | ICD-10-CM

## 2015-10-09 DIAGNOSIS — R062 Wheezing: Secondary | ICD-10-CM | POA: Diagnosis not present

## 2015-10-09 DIAGNOSIS — I10 Essential (primary) hypertension: Secondary | ICD-10-CM | POA: Diagnosis present

## 2015-10-09 DIAGNOSIS — Z9981 Dependence on supplemental oxygen: Secondary | ICD-10-CM

## 2015-10-09 DIAGNOSIS — R0789 Other chest pain: Secondary | ICD-10-CM | POA: Diagnosis not present

## 2015-10-09 DIAGNOSIS — E1165 Type 2 diabetes mellitus with hyperglycemia: Secondary | ICD-10-CM | POA: Diagnosis present

## 2015-10-09 DIAGNOSIS — I482 Chronic atrial fibrillation: Secondary | ICD-10-CM | POA: Diagnosis present

## 2015-10-09 DIAGNOSIS — Z955 Presence of coronary angioplasty implant and graft: Secondary | ICD-10-CM

## 2015-10-09 DIAGNOSIS — R079 Chest pain, unspecified: Secondary | ICD-10-CM | POA: Diagnosis not present

## 2015-10-09 DIAGNOSIS — E875 Hyperkalemia: Secondary | ICD-10-CM

## 2015-10-09 HISTORY — DX: Acute myocardial infarction, unspecified: I21.9

## 2015-10-09 LAB — CBC WITH DIFFERENTIAL/PLATELET
Basophils Absolute: 0 10*3/uL (ref 0.0–0.1)
Basophils Relative: 0 %
Eosinophils Absolute: 0.1 10*3/uL (ref 0.0–0.7)
Eosinophils Relative: 1 %
HCT: 40.9 % (ref 36.0–46.0)
Hemoglobin: 13 g/dL (ref 12.0–15.0)
Lymphocytes Relative: 8 %
Lymphs Abs: 0.7 10*3/uL (ref 0.7–4.0)
MCH: 30.4 pg (ref 26.0–34.0)
MCHC: 31.8 g/dL (ref 30.0–36.0)
MCV: 95.6 fL (ref 78.0–100.0)
Monocytes Absolute: 0.5 10*3/uL (ref 0.1–1.0)
Monocytes Relative: 6 %
Neutro Abs: 7.1 10*3/uL (ref 1.7–7.7)
Neutrophils Relative %: 85 %
Platelets: 227 10*3/uL (ref 150–400)
RBC: 4.28 MIL/uL (ref 3.87–5.11)
RDW: 13.6 % (ref 11.5–15.5)
WBC: 8.5 10*3/uL (ref 4.0–10.5)

## 2015-10-09 LAB — BASIC METABOLIC PANEL
Anion gap: 12 (ref 5–15)
BUN: 14 mg/dL (ref 6–20)
CO2: 29 mmol/L (ref 22–32)
Calcium: 8.7 mg/dL — ABNORMAL LOW (ref 8.9–10.3)
Chloride: 97 mmol/L — ABNORMAL LOW (ref 101–111)
Creatinine, Ser: 1.41 mg/dL — ABNORMAL HIGH (ref 0.44–1.00)
GFR calc Af Amer: 40 mL/min — ABNORMAL LOW (ref >60)
GFR calc non Af Amer: 34 mL/min — ABNORMAL LOW (ref >60)
Glucose, Bld: 112 mg/dL — ABNORMAL HIGH (ref 65–99)
Potassium: 3.8 mmol/L (ref 3.5–5.1)
Sodium: 138 mmol/L (ref 135–145)

## 2015-10-09 LAB — TROPONIN I
Troponin I: <0.03 ng/mL (ref <0.031)
Troponin I: <0.03 ng/mL (ref <0.031)
Troponin I: <0.03 ng/mL (ref <0.031)

## 2015-10-09 LAB — URINALYSIS, ROUTINE W REFLEX MICROSCOPIC
Bilirubin Urine: NEGATIVE
Glucose, UA: NEGATIVE mg/dL
Ketones, ur: NEGATIVE mg/dL
Leukocytes, UA: NEGATIVE
Nitrite: NEGATIVE
Protein, ur: 30 mg/dL — AB
Specific Gravity, Urine: 1.009 (ref 1.005–1.030)
Urobilinogen, UA: 0.2 mg/dL (ref 0.0–1.0)
pH: 6 (ref 5.0–8.0)

## 2015-10-09 LAB — I-STAT TROPONIN, ED: Troponin i, poc: 0.02 ng/mL (ref 0.00–0.08)

## 2015-10-09 LAB — BRAIN NATRIURETIC PEPTIDE: B Natriuretic Peptide: 375 pg/mL — ABNORMAL HIGH (ref 0.0–100.0)

## 2015-10-09 LAB — URINE MICROSCOPIC-ADD ON

## 2015-10-09 LAB — PROTIME-INR
INR: 2.6 — ABNORMAL HIGH (ref 0.00–1.49)
Prothrombin Time: 27.5 seconds — ABNORMAL HIGH (ref 11.6–15.2)

## 2015-10-09 LAB — GLUCOSE, CAPILLARY
Glucose-Capillary: 162 mg/dL — ABNORMAL HIGH (ref 65–99)
Glucose-Capillary: 136 mg/dL — ABNORMAL HIGH (ref 65–99)
Glucose-Capillary: 244 mg/dL — ABNORMAL HIGH (ref 65–99)

## 2015-10-09 LAB — PROCALCITONIN: Procalcitonin: <0.1 ng/mL

## 2015-10-09 LAB — CBG MONITORING, ED: Glucose-Capillary: 204 mg/dL — ABNORMAL HIGH (ref 65–99)

## 2015-10-09 LAB — URINE CULTURE: Organism ID, Bacteria: NO GROWTH

## 2015-10-09 MED ORDER — ACETAMINOPHEN 325 MG PO TABS
650.0000 mg | ORAL_TABLET | ORAL | Status: DC | PRN
  Administered 2015-10-10 – 2015-10-13 (×3): 650 mg via ORAL
  Filled 2015-10-09 (×3): qty 2

## 2015-10-09 MED ORDER — DILTIAZEM HCL ER COATED BEADS 300 MG PO CP24
300.0000 mg | ORAL_CAPSULE | Freq: Every day | ORAL | Status: DC
  Administered 2015-10-09 – 2015-10-13 (×5): 300 mg via ORAL
  Filled 2015-10-09 (×5): qty 1

## 2015-10-09 MED ORDER — DEXTROSE 5 % IV SOLN
2.0000 g | INTRAVENOUS | Status: DC
  Administered 2015-10-10 – 2015-10-14 (×5): 2 g via INTRAVENOUS
  Filled 2015-10-09 (×6): qty 2

## 2015-10-09 MED ORDER — WARFARIN - PHARMACIST DOSING INPATIENT: Freq: Every day | Status: DC

## 2015-10-09 MED ORDER — METHYLPREDNISOLONE SODIUM SUCC 125 MG IJ SOLR
60.0000 mg | Freq: Three times a day (TID) | INTRAMUSCULAR | Status: AC
  Administered 2015-10-09 – 2015-10-12 (×11): 60 mg via INTRAVENOUS
  Filled 2015-10-09 (×12): qty 2

## 2015-10-09 MED ORDER — IPRATROPIUM-ALBUTEROL 0.5-2.5 (3) MG/3ML IN SOLN
3.0000 mL | Freq: Four times a day (QID) | RESPIRATORY_TRACT | Status: DC
  Administered 2015-10-09 – 2015-10-12 (×13): 3 mL via RESPIRATORY_TRACT
  Filled 2015-10-09 (×13): qty 3

## 2015-10-09 MED ORDER — CITALOPRAM HYDROBROMIDE 40 MG PO TABS
40.0000 mg | ORAL_TABLET | Freq: Every day | ORAL | Status: DC
  Administered 2015-10-09 – 2015-10-10 (×2): 40 mg via ORAL
  Filled 2015-10-09: qty 4
  Filled 2015-10-09: qty 1

## 2015-10-09 MED ORDER — VANCOMYCIN HCL IN DEXTROSE 750-5 MG/150ML-% IV SOLN
750.0000 mg | INTRAVENOUS | Status: DC
  Administered 2015-10-10 – 2015-10-11 (×2): 750 mg via INTRAVENOUS
  Filled 2015-10-09 (×2): qty 150

## 2015-10-09 MED ORDER — POTASSIUM CHLORIDE CRYS ER 20 MEQ PO TBCR
20.0000 meq | EXTENDED_RELEASE_TABLET | Freq: Every day | ORAL | Status: DC
  Administered 2015-10-09 – 2015-10-12 (×4): 20 meq via ORAL
  Filled 2015-10-09 (×4): qty 1

## 2015-10-09 MED ORDER — PREDNISONE 20 MG PO TABS: 50.0000 mg | ORAL_TABLET | Freq: Every day | ORAL | Status: DC

## 2015-10-09 MED ORDER — WARFARIN SODIUM 2.5 MG PO TABS
2.5000 mg | ORAL_TABLET | Freq: Once | ORAL | Status: AC
  Administered 2015-10-09: 2.5 mg via ORAL
  Filled 2015-10-09: qty 1

## 2015-10-09 MED ORDER — DEXTROSE 5 % IV SOLN: 2.0000 g | Freq: Every day | INTRAVENOUS | Status: DC

## 2015-10-09 MED ORDER — METOPROLOL TARTRATE 100 MG PO TABS
100.0000 mg | ORAL_TABLET | Freq: Two times a day (BID) | ORAL | Status: DC
  Administered 2015-10-09 – 2015-10-14 (×11): 100 mg via ORAL
  Filled 2015-10-09 (×9): qty 1
  Filled 2015-10-09: qty 4
  Filled 2015-10-09: qty 1

## 2015-10-09 MED ORDER — DEXTROSE 5 % IV SOLN: 2.0000 g | Freq: Once | INTRAVENOUS | Status: DC

## 2015-10-09 MED ORDER — ATORVASTATIN CALCIUM 40 MG PO TABS
40.0000 mg | ORAL_TABLET | Freq: Every day | ORAL | Status: DC
  Administered 2015-10-09 – 2015-10-13 (×5): 40 mg via ORAL
  Filled 2015-10-09 (×6): qty 1

## 2015-10-09 MED ORDER — DEXTROSE 5 % IV SOLN
2.0000 g | Freq: Once | INTRAVENOUS | Status: AC
  Administered 2015-10-09: 2 g via INTRAVENOUS
  Filled 2015-10-09: qty 2

## 2015-10-09 MED ORDER — ONDANSETRON HCL 4 MG/2ML IJ SOLN: 4.0000 mg | Freq: Four times a day (QID) | INTRAMUSCULAR | Status: DC | PRN

## 2015-10-09 MED ORDER — FUROSEMIDE 10 MG/ML IJ SOLN
40.0000 mg | Freq: Once | INTRAMUSCULAR | Status: AC
  Administered 2015-10-09: 40 mg via INTRAVENOUS
  Filled 2015-10-09: qty 4

## 2015-10-09 MED ORDER — INSULIN ASPART 100 UNIT/ML ~~LOC~~ SOLN
0.0000 [IU] | Freq: Every day | SUBCUTANEOUS | Status: DC
  Administered 2015-10-09: 2 [IU] via SUBCUTANEOUS

## 2015-10-09 MED ORDER — VANCOMYCIN HCL IN DEXTROSE 1-5 GM/200ML-% IV SOLN
1000.0000 mg | Freq: Once | INTRAVENOUS | Status: AC
  Administered 2015-10-09: 1000 mg via INTRAVENOUS
  Filled 2015-10-09: qty 200

## 2015-10-09 MED ORDER — MOMETASONE FURO-FORMOTEROL FUM 100-5 MCG/ACT IN AERO
2.0000 | INHALATION_SPRAY | Freq: Two times a day (BID) | RESPIRATORY_TRACT | Status: DC
  Administered 2015-10-09 – 2015-10-10 (×4): 2 via RESPIRATORY_TRACT
  Filled 2015-10-09: qty 8.8

## 2015-10-09 MED ORDER — BUSPIRONE HCL 15 MG PO TABS
7.5000 mg | ORAL_TABLET | Freq: Two times a day (BID) | ORAL | Status: DC
  Administered 2015-10-09 – 2015-10-14 (×11): 7.5 mg via ORAL
  Filled 2015-10-09 (×12): qty 1

## 2015-10-09 MED ORDER — INSULIN ASPART 100 UNIT/ML ~~LOC~~ SOLN
0.0000 [IU] | Freq: Three times a day (TID) | SUBCUTANEOUS | Status: DC
  Administered 2015-10-09: 3 [IU] via SUBCUTANEOUS
  Administered 2015-10-09: 1 [IU] via SUBCUTANEOUS
  Administered 2015-10-10: 2 [IU] via SUBCUTANEOUS
  Filled 2015-10-09: qty 1

## 2015-10-09 MED ORDER — ALBUTEROL SULFATE (2.5 MG/3ML) 0.083% IN NEBU
2.5000 mg | INHALATION_SOLUTION | RESPIRATORY_TRACT | Status: DC
  Administered 2015-10-09: 2.5 mg via RESPIRATORY_TRACT
  Filled 2015-10-09: qty 3

## 2015-10-09 MED ORDER — IPRATROPIUM-ALBUTEROL 0.5-2.5 (3) MG/3ML IN SOLN
3.0000 mL | Freq: Once | RESPIRATORY_TRACT | Status: AC
  Administered 2015-10-09: 3 mL via RESPIRATORY_TRACT
  Filled 2015-10-09: qty 3

## 2015-10-09 MED ORDER — GI COCKTAIL ~~LOC~~: 30.0000 mL | Freq: Four times a day (QID) | ORAL | Status: DC | PRN

## 2015-10-09 MED ORDER — LINAGLIPTIN 5 MG PO TABS
5.0000 mg | ORAL_TABLET | Freq: Every day | ORAL | Status: DC
  Administered 2015-10-09 – 2015-10-14 (×6): 5 mg via ORAL
  Filled 2015-10-09 (×7): qty 1

## 2015-10-09 MED ORDER — ENSURE ENLIVE PO LIQD
237.0000 mL | ORAL | Status: DC
  Administered 2015-10-10 – 2015-10-12 (×3): 237 mL via ORAL
  Filled 2015-10-09: qty 237

## 2015-10-09 MED ORDER — PANTOPRAZOLE SODIUM 40 MG PO TBEC
40.0000 mg | DELAYED_RELEASE_TABLET | Freq: Every day | ORAL | Status: DC
  Administered 2015-10-09 – 2015-10-14 (×6): 40 mg via ORAL
  Filled 2015-10-09 (×6): qty 1

## 2015-10-09 NOTE — H&P (Signed)
History and Physical  [name]  ZOX:[id number]  DOB: 12/30/1932  DOA: 10/09/2015  Referring physician: Rolland PorterMark James, MD PCP: Katie PieriniMARTIN,[name], [name]   Chief Complaint: Chest pain and cough  HPI: Katie FergusonDessie A Melendez is a 81 y.o. female with a past medical history significant for COPD on 2L home O2, CAD status post PCI 2, HTN, atrial fibrillation on warfarin, CKD stage IV, chronic diastolic CHF, and active smoking who presents with chest discomfort, cough and fever.  The patient has been having fevers and dysuria for the last 2 weeks, for which she was treated by her [name] with Cipro for UTI followed by amoxicillin and fluconazole for a UTI and yeast infection.  Now last night the patient woke up at 2 AM with epigastric and left-sided chest pain that did not radiate. She had cough and shortness of breath and so she called EMS. On arrival, EMS found her hypoxic and gave her nebulized albuterol.  In the ED, the patient was hemodynamically stable, had normal serum creatinine, had no leukocytosis. Chest x-ray was poor quality suggested a right lower lobe infiltrate. Initial troponin was negative. She was given Solu-Medrol, duo nebs, and Lasix. Her chest pain resolved somewhat.   Review of Systems:  Patient seen 5:55 AM on 10/09/2015. Pt complains of chest pain, shortness of breath, cough, fevers, sputum that is yellow, leg swelling. All other systems negative except as just noted or noted in the history of present illness.  Past Medical History  Diagnosis Date  . Hypertension   . CAD (coronary artery disease) 5/96  . Edema   . Hyperlipidemia   . Chronic atrial fibrillation (HCC)     Coumadin therapy   . Osteopenia   . Renal insufficiency   . Closed right ankle fracture   . CHF (congestive heart failure) (HCC)   . LBP (low back pain)   . Arthritis   . Cataract   . COPD (chronic obstructive pulmonary disease) (HCC)     wears O2 at home as needed  . Esophageal stricture   .  Esophageal dysmotility 09/2011    seen on barium esophagram.  . Carotid artery occlusion   The above past medical history was reviewed.  Past Surgical History  Procedure Laterality Date  . Heart stent      X2  . Mole removed  2013    umbilicus  . Esophagogastroduodenoscopy  2012, 05/2013,09/2013    dilation of esophagus and stricture in 09/2011, 05/2013.  meat disimpaction 09/2013  . Esophagogastroduodenoscopy N/A 10/18/2013    Procedure: ESOPHAGOGASTRODUODENOSCOPY (EGD);  Surgeon: Meryl DareMalcolm T Stark, MD;  [facility];  Service: Endoscopy;  Laterality: N/A;  possible food impaction  The above surgical history was reviewed.  Social History: Patient lives alone, her niece lives next door. The patient is an active smoker. The patient is independent with all ADLs.  No Known Allergies  Family History  Problem Relation Age of Onset  . Colon cancer Neg Hx   . Esophageal cancer Neg Hx   . Stomach cancer Neg Hx   . Rectal cancer Neg Hx   . Hyperlipidemia Mother   . Hypertension Mother   . Hyperlipidemia Father   . Heart disease Father     After age 79  . Cancer Sister     Lung  . Hyperlipidemia Sister   . Hypertension Sister     Prior to Admission medications   Medication Sig Start Date End Date Taking? Authorizing Provider  atorvastatin (LIPITOR) 40 MG  tablet TAKE 1 TABLET (40 MG TOTAL) BY MOUTH DAILY. 09/16/15  Yes Mary-Melendez Daphine Deutscher, [name]  busPIRone (BUSPAR) 7.5 MG tablet Take 1 tablet (7.5 mg total) by mouth 2 (two) times daily. . 09/24/15  Yes Mechele Claude, MD  citalopram (CELEXA) 40 MG tablet Take 1 tablet (40 mg total) by mouth daily. 09/30/15  Yes Mary-Melendez Daphine Deutscher, [name]  dexlansoprazole (DEXILANT) 60 MG capsule TAKE 1 CAPSULE (60 MG TOTAL) BY MOUTH DAILY. 09/16/15  Yes Mary-Melendez Daphine Deutscher, [name]  diltiazem (CARDIZEM CD) 300 MG 24 hr capsule Take 1 capsule (300 mg total) by mouth daily. 09/29/15  Yes Mary-Melendez Daphine Deutscher, [name]  Fluticasone-Salmeterol (ADVAIR DISKUS)  250-50 MCG/DOSE AEPB Inhale 1 puff into the lungs 2 (two) times daily. 06/16/15  Yes Mary-Melendez Daphine Deutscher, [name]  HYDROcodone-acetaminophen (NORCO/VICODIN) 5-325 MG per tablet Take 1 tablet by mouth 2 (two) times daily as needed for moderate pain. Patient taking differently: Take 1 tablet by mouth every 6 (six) hours as needed for moderate pain.  09/10/15  Yes Mary-Melendez Daphine Deutscher, [name]  metoprolol (LOPRESSOR) 100 MG tablet Take 1 tablet (100 mg total) by mouth 2 (two) times daily. 10/06/15  Yes Mary-Melendez Daphine Deutscher, [name]  nitroGLYCERIN (NITROSTAT) 0.4 MG SL tablet Place 1 tablet (0.4 mg total) under the tongue every 5 (five) minutes as needed for chest pain. 04/10/14  Yes Rollene Rotunda, MD  tiotropium (SPIRIVA HANDIHALER) 18 MCG inhalation capsule PLACE 1 CAPSULE (18 MCG TOTAL) INTO INHALER AND INHALE DAILY. 09/16/15  Yes Mary-Melendez Daphine Deutscher, [name]  warfarin (COUMADIN) 5 MG tablet Take 0.5-1 tablets (2.5-5 mg total) by mouth See admin instructions. Takes 1 tablet ( ) every Tuesdays and Fridays and 1/2 tablet (2.5 mg) all other days. 09/16/15  Yes Mary-Melendez Daphine Deutscher, [name]  alendronate (FOSAMAX) 70 MG tablet Take 1 tablet (70 mg total) by mouth once a week. Take with a full glass of water on an empty stomach. 09/16/15   Mary-Melendez Daphine Deutscher, [name]  feeding supplement, ENSURE ENLIVE, (ENSURE ENLIVE) LIQD Take 237 mLs by mouth daily. 09/18/15   Shanker Levora Dredge, MD  potassium chloride SA (KLOR-CON M20) 20 MEQ tablet Take 1 tablet (20 mEq total) by mouth daily. Patient taking differently: Take 20 mEq by mouth daily as needed (take when taking Torsemide).  09/16/15   Mary-Melendez Daphine Deutscher, [name]  torsemide (DEMADEX) 20 MG tablet Take 1 tablet (20 mg total) by mouth once. Take one tablet by mouth if weight is 160 pounds or above 09/16/15   Mary-Melendez Daphine Deutscher, [name]    Physical Exam: BP 166/76 mmHg  Pulse 84  Temp(Src) 98.6 F (37 C) (Oral)  Resp 18  Ht 5' (1.524 m)  Wt 70.308 kg (155 lb)  BMI 30.27 kg/m2  SpO2  92%  LMP 03/27/1983 General appearance: Elderly tired appearing adult female, sleepy but in no acute distress.  Responds appropriately to questions.   Eyes: Sclerae normal without icterus, conjunctiva pink, lids and lashes normal.  PERRL and EOMI.   Nose: No deformity, discharge, or epistaxis.   Mouth: OP dry.  No airway deformities.   Skin: Warm and dry.  No jaundice.  Marked solar elastosis. Cardiac: RRR, nl S1-S2, no murmurs appreciated.  Respiratory: Normal respiratory rate and rhythm.  Crackles in both bases, expiratory wheezes. Abdomen: BS present.  Abdomen soft without rigidity.  No TTP or rebound all quadrants. No ascites, distension.   Neuro: Sensorium intact.  Memory seems intact.  Moves all extremities equally and with normal coordination.    Psych: Appropriate affect.  Thought content/process linear/appropriate.  No  evidence of aural or visual hallucinations or delusions.       Labs on Admission:  The metabolic panel is notable for normal sodium, potassium, bicarbonate. The serum creatinine is 1.41 mg/dL which is close to baseline. The initial troponin is negative. Blood cultures are pending. The BNP is 375 pg/ml. The complete blood count is notable for notable for absence of leukocytosis, normal hemogram and, normal platelet count..   Radiological Exams on Admission: Personally reviewed: Dg Chest Port 1 View 10/09/2015   There is opacity in the right lower base which appears more prominent than previous and I believe represents pneumonia.   EKG: Independently reviewed. This has flattening of lateral T waves that was not present in the last EKG 1 month ago. Otherwise NSR    Assessment/Plan  1. HCAP:  This is new.  I think it is reasonable to call us pneumonia given that the patient has had fevers this week, has what I believe appears to be in new right lower lobe opacity, and his hypoxic requiring 4 L O2. She does not have systemic signs of infection. She has had  recent Cipro and amoxicillin and has been hospitalist twice in the last month which gives me some concern for drug-resistant pathogens. -Vancomycin and ceftazidime -Procalcitonin -Sputum culture -Follow blood cultures  2. COPD on home oxygen:  The patient may also be having on flare of COPD. -Prednisone 50 mg for 5 days -Continue home Advair -Nebulized albuterol  3. CHF, chronic diastolic:  This is close to baseline. -Furosemide given -Follow lung exam -Continue home beta blocker  4. Type 2 diabetes:  This is a new diagnosis for the patient and she was recently started on metformin. Metformin has been stopped given the patient's chronic kidney disease. -Start linagliptin  5. GERD:  Stable.  -Continue PPI  6. Depression:  Stable.  -Continue citalopram and BuSpar  7. CKD stage IV:  Stable.   8. HTN: Stable. -Continue diltiazem and metoprolol and Imdur.  9. Afib: Stable. -Continue warfarin and diltiazem     DVT PPx: Warfarin Diet: Regular Code Status: DNR  Disposition Plan:  At the time of admission, it appears that the appropriate admission status for this patient is INPATIENT. This is judged to be reasonable and necessary in order to provide the required intensity of service to ensure the patient's safety given the presenting symptoms, physical exam findings, and initial radiographic and laboratory data in the context of their chronic comorbidities.  Together, these circumstances are felt to place her/him at high risk for further clinical deterioration threatening life, limb, or organ.     Alberteen Sam [facility] Pager [phone]

## 2015-10-09 NOTE — ED Provider Notes (Signed)
CSN: [id number]     Arrival date & time 10/09/15  0327 History  By signing my name below, I, Doreatha Martin, attest that this documentation has been prepared under the direction and in the presence of Rolland Porter, MD. Electronically Signed: Doreatha Martin, ED Scribe. 10/09/2015. 4:12 AM.    Chief Complaint  Patient presents with  . Shortness of Breath  . Chest Pain   The history is provided by the patient, a relative and the EMS personnel. No language interpreter was used.    HPI Comments: [name] is a 81 y.o. female with hx of HTN, CAD, HLD, chronic Afib, CHF, COPD, carotid artery occlusion who presents to the Emergency Department complaining of moderate, sharp, left-sided CP onset 2 hours ago with associated SOB, increased cough from baseline (phlegm also increased). Family states that the phlegm was milky white but is now green. Pt is currently on a second round of abx- Amoxicillin for a UTI. She took Cipro before starting Amoxicillin and it did not completely clear the infection. Pt states that pain is exacerbated by breathing. She states that her pain has greatly improved since onset.  Given HHN albuterol, and NTG by EMS.  Pt has PRN 2L/min O2 at home. Family notes that she has mild low-grade fevers of 99-100 recently per home care nurse. Pt was given 2 NTG,  Solumedrol and given 2 duonebs on route with relief of symptoms. Pt takes Lasix when her weight is above 160lbs. PCP is Dr. Christell Constant. She notes a recent hospitalization within 2 months.    Past Medical History  Diagnosis Date  . Hypertension   . CAD (coronary artery disease) 5/96  . Edema   . Hyperlipidemia   . Chronic atrial fibrillation (HCC)     Coumadin therapy   . Osteopenia   . Renal insufficiency   . Closed right ankle fracture   . CHF (congestive heart failure) (HCC)   . LBP (low back pain)   . Arthritis   . Cataract   . COPD (chronic obstructive pulmonary disease) (HCC)     wears O2 at home as needed  . Esophageal  stricture   . Esophageal dysmotility 09/2011    seen on barium esophagram.  . Carotid artery occlusion    Past Surgical History  Procedure Laterality Date  . Heart stent      X2  . Mole removed  2013    umbilicus  . Esophagogastroduodenoscopy  2012, 05/2013,09/2013    dilation of esophagus and stricture in 09/2011, 05/2013.  meat disimpaction 09/2013  . Esophagogastroduodenoscopy N/A 10/18/2013    Procedure: ESOPHAGOGASTRODUODENOSCOPY (EGD);  Surgeon: Meryl Dare, MD;  Location: [facility] ENDOSCOPY;  Service: Endoscopy;  Laterality: N/A;  possible food impaction   Family History  Problem Relation Age of Onset  . Colon cancer Neg Hx   . Esophageal cancer Neg Hx   . Stomach cancer Neg Hx   . Rectal cancer Neg Hx   . Hyperlipidemia Mother   . Hypertension Mother   . Hyperlipidemia Father   . Heart disease Father     After age 56  . Cancer Sister     Lung  . Hyperlipidemia Sister   . Hypertension Sister    Social History  Substance Use Topics  . Smoking status: Current Every Day Smoker -- 0.50 packs/day    Types: Cigarettes  . Smokeless tobacco: Never Used  . Alcohol Use: No     Comment: previous   OB History  No data available     Review of Systems  Constitutional: Negative for fever, chills, diaphoresis, appetite change and fatigue.  HENT: Negative for mouth sores, sore throat and trouble swallowing.   Eyes: Negative for visual disturbance.  Respiratory: Positive for cough and shortness of breath. Negative for chest tightness and wheezing.   Cardiovascular: Positive for chest pain.  Gastrointestinal: Negative for nausea, vomiting, abdominal pain, diarrhea and abdominal distention.  Endocrine: Negative for polydipsia, polyphagia and polyuria.  Genitourinary: Negative for dysuria, frequency and hematuria.  Musculoskeletal: Negative for gait problem.  Skin: Negative for color change, pallor and rash.  Neurological: Negative for dizziness, syncope, light-headedness and  headaches.  Hematological: Does not bruise/bleed easily.  Psychiatric/Behavioral: Negative for behavioral problems and confusion.   Allergies  Review of patient's allergies indicates no known allergies.  Home Medications   Prior to Admission medications   Medication Sig Start Date End Date Taking? Authorizing Provider  alendronate (FOSAMAX) 70 MG tablet Take 1 tablet (70 mg total) by mouth once a week. Take with a full glass of water on an empty stomach. 09/16/15   Mary-Margaret Daphine Deutscher, [name]  amoxicillin (AMOXIL) 875 MG tablet Take 1 tablet (875 mg total) by mouth 2 (two) times daily. 1 po BID 10/07/15   Mary-Margaret Daphine Deutscher, [name]  atorvastatin (LIPITOR) 40 MG tablet TAKE 1 TABLET (40 MG TOTAL) BY MOUTH DAILY. 09/16/15   Mary-Margaret Daphine Deutscher, [name]  busPIRone (BUSPAR) 7.5 MG tablet Take 1 tablet (7.5 mg total) by mouth 2 (two) times daily. . 09/24/15   Mechele Claude, MD  Cholecalciferol (VITAMIN D) 2000 UNITS tablet Take 2,000 Units by mouth daily.      Historical Provider, MD  citalopram (CELEXA) 40 MG tablet Take 1 tablet (40 mg total) by mouth daily. 09/30/15   Mary-Margaret Daphine Deutscher, [name]  dexlansoprazole (DEXILANT) 60 MG capsule TAKE 1 CAPSULE (60 MG TOTAL) BY MOUTH DAILY. 09/16/15   Mary-Margaret Daphine Deutscher, [name]  diltiazem (CARDIZEM CD) 300 MG 24 hr capsule Take 1 capsule (300 mg total) by mouth daily. 09/29/15   Mary-Margaret Daphine Deutscher, [name]  feeding supplement, ENSURE ENLIVE, (ENSURE ENLIVE) LIQD Take 237 mLs by mouth daily. 09/18/15   Shanker Levora Dredge, MD  fluconazole (DIFLUCAN) 150 MG tablet 1 po q week x 4 weeks 10/07/15   Mary-Margaret Daphine Deutscher, [name]  Fluticasone-Salmeterol (ADVAIR DISKUS) 250-50 MCG/DOSE AEPB Inhale 1 puff into the lungs 2 (two) times daily. 06/16/15   Mary-Margaret Daphine Deutscher, [name]  HYDROcodone-acetaminophen (NORCO/VICODIN) 5-325 MG per tablet Take 1 tablet by mouth 2 (two) times daily as needed for moderate pain. Patient taking differently: Take 1 tablet by mouth every 6 (six) hours as  needed for moderate pain.  09/10/15   Mary-Margaret Daphine Deutscher, [name]  isosorbide mononitrate (IMDUR) 30 MG 24 hr tablet Take 1 tablet (30 mg total) by mouth daily. 09/16/15   Mary-Margaret Daphine Deutscher, [name]  metFORMIN (GLUCOPHAGE-XR) 500 MG 24 hr tablet Take 1 tablet (500 mg total) by mouth daily with breakfast. 09/24/15   Mechele Claude, MD  metoprolol (LOPRESSOR) 100 MG tablet Take 1 tablet (100 mg total) by mouth 2 (two) times daily. 10/06/15   Mary-Margaret Daphine Deutscher, [name]  nitroGLYCERIN (NITROSTAT) 0.4 MG SL tablet Place 1 tablet (0.4 mg total) under the tongue every 5 (five) minutes as needed for chest pain. 04/10/14   Rollene Rotunda, MD  potassium chloride SA (KLOR-CON M20) 20 MEQ tablet Take 1 tablet (20 mEq total) by mouth daily. 09/16/15   Mary-Margaret Daphine Deutscher, [name]  tiotropium (SPIRIVA HANDIHALER) 18 MCG inhalation capsule PLACE  1 CAPSULE (18 MCG TOTAL) INTO INHALER AND INHALE DAILY. 09/16/15   Mary-Margaret Daphine DeutscherMartin, [name]  torsemide (DEMADEX) 20 MG tablet Take 1 tablet (20 mg total) by mouth once. Take one tablet by mouth if weight is 160 pounds or above 09/16/15   Mary-Margaret Daphine DeutscherMartin, [name]  warfarin (COUMADIN) 5 MG tablet Take 0.5-1 tablets (2.5-5 mg total) by mouth See admin instructions. Takes 1 tablet (5mg ) every Tuesdays and Fridays and 1/2 tablet (2.5 mg) all other days. 09/16/15   Mary-Margaret Daphine DeutscherMartin, [name]   BP 157/108 mmHg  Pulse 90  Temp(Src) 98.6 F (37 C) (Oral)  Resp 18  Ht 5' (1.524 m)  Wt 155 lb (70.308 kg)  BMI 30.27 kg/m2  SpO2 91%  LMP 03/27/1983 Physical Exam  Constitutional: She is oriented to person, place, and time. She appears well-developed and well-nourished. No distress.  HENT:  Head: Normocephalic.  Eyes: Conjunctivae are normal. Pupils are equal, round, and reactive to light. No scleral icterus.  Neck: Normal range of motion. Neck supple. No thyromegaly present.  Cardiovascular: Normal rate and regular rhythm.  Exam reveals no gallop and no friction rub.   No murmur  heard. Pulmonary/Chest: Effort normal. No respiratory distress. She has no wheezes. She has rales in the right lower field and the left lower field.  Bibasilar crackles.  Abdominal: Soft. Bowel sounds are normal. She exhibits no distension. There is no tenderness. There is no rebound.  Musculoskeletal: Normal range of motion.  Neurological: She is alert and oriented to person, place, and time.  Skin: Skin is warm and dry. No rash noted.  Psychiatric: She has a normal mood and affect. Her behavior is normal.  Nursing note and vitals reviewed.  ED Course  Procedures (including critical care time) DIAGNOSTIC STUDIES: Oxygen Saturation is 92% on 4L/min, low by my interpretation.    COORDINATION OF CARE: 4:10 AM Discussed treatment plan with pt at bedside and pt agreed to plan.   Labs Review Labs Reviewed  BASIC METABOLIC PANEL - Abnormal; Notable for the following:    Chloride 97 (*)    Glucose, Bld 112 (*)    Creatinine, Ser 1.41 (*)    Calcium 8.7 (*)    GFR calc non Af Amer 34 (*)    GFR calc Af Amer 40 (*)    All other components within normal limits  BRAIN NATRIURETIC PEPTIDE - Abnormal; Notable for the following:    B Natriuretic Peptide 375.0 (*)    All other components within normal limits  CULTURE, BLOOD (ROUTINE X 2)  CULTURE, BLOOD (ROUTINE X 2)  CBC WITH DIFFERENTIAL/PLATELET  Rosezena SensorI-STAT TROPOININ, ED    Imaging Review Dg Chest Port 1 View  10/09/2015  CLINICAL DATA:  New onset left chest pain. No radiation. Shortness of breath. Symptoms since 0200 hours today. EXAM: PORTABLE CHEST 1 VIEW COMPARISON:  09/16/2015 FINDINGS: Shallow inspiration with atelectasis in the lung bases. Possible superimposed infiltration in the right lung base. Linear atelectasis in the midlung regions bilaterally. Heart size and pulmonary vascularity are increased consistent with pulmonary vascular congestion. Peribronchial thickening suggesting chronic bronchitis. Interstitial changes in the lung  bases may indicate early edema. Calcified and tortuous aorta. No blunting of costophrenic angles. No pneumothorax. IMPRESSION: Two shallow inspiration with atelectasis in the lung bases and possible superimposed infiltration in the right lung base. Cardiac enlargement with pulmonary vascular congestion and mild interstitial edema. Electronically Signed   By: Burman NievesWilliam  Stevens M.D.   On: 10/09/2015 03:55   I have personally  reviewed and evaluated these images and lab results as part of my medical decision-making.   EKG Interpretation   Date/Time:  Thursday October 09 2015 03:34:56 EDT Ventricular Rate:  88 PR Interval:    QRS Duration: 97 QT Interval:  385 QTC Calculation: 466 R Axis:   72 Text Interpretation:  Atrial fibrillation  T wave abnormalities Confirmed  by Fayrene Fearing  MD, Dahna Hattabaugh ([id number]) on 10/09/2015 4:56:35 AM      MDM   Final diagnoses:  HCAP (healthcare-associated pneumonia)  COPD exacerbation (HCC)  Chest pain, unspecified chest pain type   Patient's O2 requirement is slightly higher than usual. Typically on 2 L. She is 91% on 4 L here. No increased work of breathing on 4 L. Does have crackles in the bilateral bases. No dependent edema. Chest XR shows atelectasis plus right lower basilar infiltrateSuggests CHF as well.  Blood cultures obtained. Vancomycin and Maxipime ordered. Await labs.  05:37:  Patient continuing Dr. Claudie Fisherman 8 between 90 and 92% on 4 L. He is typically on 2 at home. Exam shows bibasilar crackles. No peripheral edema. BNP minimally elevated 375. Most recent echo shows EF 50-55 with grade 3 diastolic dysfunction. In rate controlled atrial fibrillation with history of the same. Exam and history consistent with pneumonia considering her recent subjective fevers increasing sputum production change in quality quantity of her sputum with abnormality noted on chest x-ray. COPD exacerbation with her history of wheezing in reported bronchospasm. Chest x-ray shows poor  inspiration and suggestive of CHF although BNP minimally elevated has been given Lasix. After cultures were obtained given Maxipime and vancomycin. Received DuoNeb 2 from paramedics with IV Solu-Medrol.  Rolland Porter, MD 10/09/15 [phone]

## 2015-10-09 NOTE — ED Notes (Signed)
Hospitalist at bedside 

## 2015-10-09 NOTE — Progress Notes (Signed)
Katie FergusonDessie A Melendez [id number] Admission Data: 10/09/2015 2:23 PM Attending Provider: Catarina Hartshornavid Tat, MD  WUJ:WJXBJY,NWGNPCP:MARTIN,Katie MARGARET, [name] Consults/ Treatment Team:    Katie FergusonDessie A Melendez is a 81 y.o. female patient admitted from ED awake, alert  & orientated  X 3,  DNR, VSS - Blood pressure 155/86, pulse 95, temperature 98.4 F (36.9 C), temperature source Oral, resp. rate 18, height 5' (1.524 m), weight 68.04 kg (150 lb), last menstrual period 03/27/1983, SpO2 92 %., O2    2 L nasal cannular, no c/o shortness of breath, no c/o chest pain, no distress noted. Tele # [phone]  placed and pt is currently running: afib.   IV site WDL:  antecubital left, condition patent and no redness with a transparent dsg that's clean dry and intact.  Allergies:  No Known Allergies   Past Medical History  Diagnosis Date  . Hypertension   . CAD (coronary artery disease) 5/96  . Edema   . Hyperlipidemia   . Chronic atrial fibrillation (HCC)     Coumadin therapy   . Osteopenia   . Renal insufficiency   . Closed right ankle fracture   . CHF (congestive heart failure) (HCC)   . LBP (low back pain)   . Arthritis   . Cataract   . COPD (chronic obstructive pulmonary disease) (HCC)     wears O2 at home as needed  . Esophageal stricture   . Esophageal dysmotility 09/2011    seen on barium esophagram.  . Carotid artery occlusion   . Myocardial infarction [facility](HCC)     History:  obtained from the patient. Tobacco/alcohol:  Smoker  Pt orientation to unit, room and routine. Information packet given to patient/family and safety video watched.  Admission INP armband ID verified with patient/family, and in place. SR up x 2, fall risk assessment complete with Patient and family verbalizing understanding of risks associated with falls. Pt verbalizes an understanding of how to use the call bell and to call for help before getting out of bed.  Skin, clean-dry- intact without evidence of bruising, or skin tears.   No evidence of skin break  down noted on exam. no rashes, no ecchymoses, no petechiae    Will cont to monitor and assist as needed.  Shyne Lehrke Consuella Loselaine, RN 10/09/2015 2:23 PM

## 2015-10-09 NOTE — ED Notes (Signed)
Lunch Tray ordered 

## 2015-10-09 NOTE — ED Notes (Addendum)
PER EMS: Patient comes from home c/o new onset CP in L chest no radiation with SOB since 0200am today.  Patient was lying in bed when it first came on and called EMS immediately. Upon [facility] arrival, pt 84% 2L O2 (pt always on 2L), put on 4L O2 Anon Raices > SpO2 increased to 97%, HR 87 A-Fib (hx A-Fib), 190/100.  Patient given 2 sprays NTG, 125mg  Solumedrol, 2 duonebs, and 1.5in. NTG paste on R chest.  Patient denies CP now, but still has SOB at rest.  Pt hx COPD, osteoporosis, anxiety, HTN.  Patient A&O x 4.  Respirations equal, labored, dyspnea at rest, wheezing.

## 2015-10-09 NOTE — Progress Notes (Signed)
Utilization review completed.  

## 2015-10-09 NOTE — Progress Notes (Signed)
ANTICOAGULATION CONSULT NOTE - Initial Consult  Pharmacy Consult for warfarin Indication: atrial fibrillation  No Known Allergies  Patient Measurements: Height: 5' (152.4 cm) Weight: 155 lb (70.308 kg) IBW/kg (Calculated) : 45.5  Vital Signs: Temp: 98.6 F (37 C) (10/13 0344) Temp Source: Oral (10/13 0344) BP: 155/95 mmHg (10/13 1130) Pulse Rate: 97 (10/13 1130)  Labs:  Recent Labs  10/09/15 0347 10/09/15 0753 10/09/15 0943 10/09/15 1233  HGB 13.0  --   --   --   HCT 40.9  --   --   --   PLT 227  --   --   --   LABPROT  --  27.5*  --   --   INR  --  2.60*  --   --   CREATININE 1.41*  --   --   --   TROPONINI  --   --  <0.03 <0.03    Estimated Creatinine Clearance: 27.8 mL/min (by C-G formula based on Cr of 1.41).   Medical History: Past Medical History  Diagnosis Date  . Hypertension   . CAD (coronary artery disease) 5/96  . Edema   . Hyperlipidemia   . Chronic atrial fibrillation (HCC)     Coumadin therapy   . Osteopenia   . Renal insufficiency   . Closed right ankle fracture   . CHF (congestive heart failure) (HCC)   . LBP (low back pain)   . Arthritis   . Cataract   . COPD (chronic obstructive pulmonary disease) (HCC)     wears O2 at home as needed  . Esophageal stricture   . Esophageal dysmotility 09/2011    seen on barium esophagram.  . Carotid artery occlusion    Assessment: 81yo female c/o CP and SOB, now free from CP but still SOB at rest, CXR shows possible superimposed infiltration in right lung base, to begin IV ABX; also to continue Coumadin for Afib, current INR unknown though was 3.1 at OV on 10/10 w/ instruction to skip one day of Coumadin, last dose 10/12. CHA2DS2VASC 7. INR now therapeutic 2.6 today. CBC wnl  PTA warfarin dose: 5mg  on Tues, Fri. 2.5mg  all other days  Goal of Therapy:  INR 2-3 Monitor platelets by anticoagulation protocol: Yes   Plan:  Warfarin 2.5mg  x 1 dose tonight Daily INR, mon s/sx bleeding  Babs BertinHaley Yarethzi Branan,  PharmD Clinical Pharmacist Pager [phone] 10/09/2015 1:49 PM

## 2015-10-09 NOTE — Progress Notes (Signed)
Katie FergusonDessie A Melendez [id number] Admitted to [id number]: 10/09/2015 1:58 PM Attending Provider: Catarina Hartshornavid Tat, MD    [name] is a 81 y.o. female patient admitted from ED awake, alert  & orientated  X 3,  DNR, VSS - Blood pressure 155/86, pulse 95, temperature 98.4 F (36.9 C), temperature source Oral, resp. rate 18, height 5' (1.524 m), weight 70.308 kg (155 lb), last menstrual period 03/27/1983, SpO2 92 %., O2    2 L nasal cannular, no c/o shortness of breath, no c/o chest pain, no distress noted. Tele # [id number]  placed and pt is currently running:atrial fibrillation, with ventricular rate of 85.   IV site WDL:  antecubital right, condition patent and no redness with a transparent dsg that's clean dry and intact.  Allergies:  No Known Allergies   Past Medical History  Diagnosis Date  . Hypertension   . CAD (coronary artery disease) 5/96  . Edema   . Hyperlipidemia   . Chronic atrial fibrillation (HCC)     Coumadin therapy   . Osteopenia   . Renal insufficiency   . Closed right ankle fracture   . CHF (congestive heart failure) (HCC)   . LBP (low back pain)   . Arthritis   . Cataract   . COPD (chronic obstructive pulmonary disease) (HCC)     wears O2 at home as needed  . Esophageal stricture   . Esophageal dysmotility 09/2011    seen on barium esophagram.  . Carotid artery occlusion     History:  Will be obtained from the patient by admission nurse.  Pt orientation to unit, room and routine. Information packet given to patient/family and safety video watched.  Admission INP armband ID verified with patient/family, and in place. SR up x 2, fall risk assessment complete with Patient and family verbalizing understanding of risks associated with falls. Pt verbalizes an understanding of how to use the call bell and to call for help before getting out of bed.  Skin, clean-dry- intact without evidence of bruising, or skin tears.   No evidence of skin break down noted on exam.    Will cont  to monitor and assist as needed.  Joana ReamerJohnson, Catheryne Deford C, RN 10/09/2015 1:58 PM

## 2015-10-09 NOTE — Progress Notes (Signed)
ANTICOAGULATION and ANTIBIOTIC CONSULT NOTE - Initial Consult  Pharmacy Consult for Coumadin and Vancocin/Zosyn Indication: Afib and r/o PNA  No Known Allergies  Patient Measurements: Height: 5' (152.4 cm) Weight: 155 lb (70.308 kg) IBW/kg (Calculated) : 45.5  Vital Signs: Temp: 98.6 F (37 C) (10/13 0344) Temp Source: Oral (10/13 0344) BP: 166/76 mmHg (10/13 0615) Pulse Rate: 84 (10/13 0615)  Labs:  Recent Labs  10/09/15 0347  HGB 13.0  HCT 40.9  PLT 227  CREATININE 1.41*    Estimated Creatinine Clearance: 27.8 mL/min (by C-G formula based on Cr of 1.41).   Medical History: Past Medical History  Diagnosis Date  . Hypertension   . CAD (coronary artery disease) 5/96  . Edema   . Hyperlipidemia   . Chronic atrial fibrillation (HCC)     Coumadin therapy   . Osteopenia   . Renal insufficiency   . Closed right ankle fracture   . CHF (congestive heart failure) (HCC)   . LBP (low back pain)   . Arthritis   . Cataract   . COPD (chronic obstructive pulmonary disease) (HCC)     wears O2 at home as needed  . Esophageal stricture   . Esophageal dysmotility 09/2011    seen on barium esophagram.  . Carotid artery occlusion     Assessment: 81yo female c/o CP and SOB, now free from CP but still SOB at rest, CXR shows possible superimposed infiltration in right lung base, to begin IV ABX; also to continue Coumadin for Afib, current INR unknown though was 3.1 at OV on 10/10 w/ instruction to skip one day of Coumadin, last dose 10/12.  Goal of Therapy:  INR 2-3 Vanc trough 15-20   Plan:  Rec'd vanc 1g and Fortaz 2g IV in ED; will continue with vancomycin 750mg  IV Q24H and Fortaz 2g IV Q24H and monitor CBC, Cx, levels prn.  Will obtain current INR prior to dosing Coumadin.  Vernard GamblesVeronda Krystle Oberman, PharmD, BCPS  10/09/2015,6:52 AM

## 2015-10-09 NOTE — Evaluation (Signed)
Physical Therapy Evaluation Patient Details Name: Katie FergusonDessie A Melendez MRN: [id number] DOB: 09-Aug-1933 Today's Date: 10/09/2015   History of Present Illness  Katie A [name] is a 81 y.o. female with a past medical history significant for COPD on 2L home O2, CAD status post PCI 2, HTN, atrial fibrillation on warfarin, CKD stage IV, chronic diastolic CHF, and active smoking who presents with chest discomfort, cough and fever.  Clinical Impression  Pt admitted with the above diagnosis. Pt currently with functional limitations due to the deficits listed below (see PT Problem List). SpO2 92% on 2L supplemental O2 at rest, 86% ambulating on 2L, and up to 91% on 3L supplemental O2.  Required min assist to rise from bed but ambulating up to 110 feet today at a supervision level, using a rolling walker for support. Anticipate she will progress quickly towards functional goals. Recommend HHPT follow-up for home safety eval due to hx of a recent fall at home which she reported today.Pt will benefit from skilled PT to increase their independence and safety with mobility to allow discharge to the venue listed below.       Follow Up Recommendations Home health PT;Supervision for mobility/OOB (Initially - which pt reports can be provided by nephew/niece)    Equipment Recommendations  None recommended by PT    Recommendations for Other Services OT consult     Precautions / Restrictions Precautions Precautions: Fall Restrictions Weight Bearing Restrictions: No      Mobility  Bed Mobility Overal bed mobility: Needs Assistance Bed Mobility: Rolling;Sidelying to Sit Rolling: Supervision Sidelying to sit: Min assist       General bed mobility comments: Min assist for truncal support to rise from bed. Cues for technique.  Transfers Overall transfer level: Needs assistance Equipment used: Rolling walker (2 wheeled) Transfers: Sit to/from Stand Sit to Stand: Supervision         General transfer  comment: supervision for safety. VC for hand placement. Performed x2 from lowest bed setting. Leans heavily on RW.  Ambulation/Gait Ambulation/Gait assistance: Supervision Ambulation Distance (Feet): 110 Feet Assistive device: Rolling walker (2 wheeled) Gait Pattern/deviations: Step-through pattern;Decreased stride length;Trunk flexed Gait velocity: decreased Gait velocity interpretation: Below normal speed for age/gender General Gait Details: Educated on safe DME use with a rolling walker. No overt loss of balance noted. VC for upright posture intermittently and pursed lip breathing. SpO2 down to 86% on 2L supplemental O2. low 90s on 3L.  Stairs            Wheelchair Mobility    Modified Rankin (Stroke Patients Only)       Balance Overall balance assessment: Needs assistance;History of Falls Sitting-balance support: No upper extremity supported;Feet supported Sitting balance-Leahy Scale: Good     Standing balance support: No upper extremity supported Standing balance-Leahy Scale: Fair                               Pertinent Vitals/Pain Pain Assessment: No/denies pain Pain Intervention(s): Monitored during session    Home Living Family/patient expects to be discharged to:: Private residence Living Arrangements: Alone Available Help at Discharge: Family;Available 24 hours/day ("as much as needed") Type of Home: House Home Access: Stairs to enter Entrance Stairs-Rails: None Entrance Stairs-Number of Steps: 2 Home Layout: One level Home Equipment: Walker - 2 wheels;Cane - single point;Tub bench      Prior Function Level of Independence: Independent with assistive device(s)  Comments: Uses cane and RW at baseline     Hand Dominance   Dominant Hand: Right    Extremity/Trunk Assessment   Upper Extremity Assessment: Defer to OT evaluation           Lower Extremity Assessment: Generalized weakness         Communication    Communication: No difficulties  Cognition Arousal/Alertness: Awake/alert Behavior During Therapy: WFL for tasks assessed/performed Overall Cognitive Status: Within Functional Limits for tasks assessed                      General Comments General comments (skin integrity, edema, etc.): SpO2 92% on 2L at rest, 86% ambulating on 2L, and up to 91% on 3L supplemental O2 while ambulating.    Exercises        Assessment/Plan    PT Assessment Patient needs continued PT services  PT Diagnosis Difficulty walking;Abnormality of gait;Generalized weakness   PT Problem List Decreased activity tolerance;Decreased balance;Decreased mobility;Decreased knowledge of use of DME;Cardiopulmonary status limiting activity;Decreased strength  PT Treatment Interventions DME instruction;Gait training;Stair training;Functional mobility training;Therapeutic activities;Therapeutic exercise;Neuromuscular re-education;Balance training;Patient/family education   PT Goals (Current goals can be found in the Care Plan section) Acute Rehab PT Goals Patient Stated Goal: Go home PT Goal Formulation: With patient Time For Goal Achievement: 10/01/15 Potential to Achieve Goals: Good    Frequency Min 4X/week   Barriers to discharge Decreased caregiver support Reports family can assist as needed. All live on same property    Co-evaluation               End of Session Equipment Utilized During Treatment: Gait belt Activity Tolerance: Patient tolerated treatment well Patient left: in chair;with call bell/phone within reach;with chair alarm set;with nursing/sitter in room Nurse Communication: Mobility status         Time: 1730-1753 PT Time Calculation (min) (ACUTE ONLY): 23 min   Charges:   PT Evaluation $Initial PT Evaluation Tier I: 1 Procedure PT Treatments $Gait Training: 8-22 mins   PT G CodesBerton Mount 10/09/2015, 6:18 PM Sunday [facility], [place] [phone]

## 2015-10-09 NOTE — Progress Notes (Signed)
Sterile sputum cup given to pt. 

## 2015-10-09 NOTE — ED Notes (Signed)
Attempted report 

## 2015-10-09 NOTE — Progress Notes (Signed)
PROGRESS NOTE  [name] ZOX:[id number] DOB: [birth year] DOA: 10/09/2015 PCP: Bennie Pierini, [name]  Brief history 81 year old female with a history of COPD, chronic respiratory failure, CAD s/p PCI x 2, hypertension, chronic atrial fibrillation, CKD stage III-4, diastolic CHF, tobacco abuse presented with 1-2 day history of left-sided chest pain or shortness of breath. The patient has been having a worsening cough with yellow sputum production. There is no hemoptysis. The patient has had subjective fevers, although she did take her temperature at home prior to admission. Her highest temperature was 100.23F. Unfortunately, the patient continues to smoke one half pack per day. She has at least a 60-pack-year history. The patient is on when necessary oxygen, but her home health nurse has been telling her that she needs to wear her oxygen 24 /7. Apparently, the patient has been recently treated with ciprofloxacin and then amoxicillin for unresolved UTIs. She presently denies any dysuria or hematuria. Assessment/Plan: Acute on chronic respiratory failure with hypoxia -Secondary to HCAP and COPD exacerbation -Presently stable on 3 L nasal cannula -Wean oxygen back to her home dose for saturation greater than 92% HCAP -Continue vancomycin and ceftazidime -Follow blood cultures COPD exacerbation -The patient continues to have diffuse wheezing but is not in distress -Switched to intravenous Solu-Medrol -continue duonebs -continue dulera Atypical chest pain/CAD -EKG with nonspecific T-wave changes -No symptoms of angina -Continue metoprolol tartrate -finish cycling troponins Chronic diastolic CHF -07/30/2015 echo shows EF 50-55%, grade 3 DD, severe LAE -clinically compensated Chronic atrial fibrillation -Rate is marginally controlled at this point -Secondary to her pneumonia and COPD exacerbation -Continue diltiazem -Continue warfarin -CHADS-VASc = 7 Diabetes mellitus type  2 -Discontinue metformin -09/17/2015 hemoglobin A1c 6.4 -Continue Tradjenta for now, but a case could be made to discontinue all agents altogether given the patient's age and level of hyperglycemia Hypertension -Continue metoprolol tartrate, diltiazem Hyperlipidemia -Continue statin  Active Problems:   Essential hypertension, benign   Hyperlipidemia with target LDL less than 100   Atrial fibrillation (HCC)   Acute chest pain   COPD exacerbation (HCC)   HCAP (healthcare-associated pneumonia)   Acute on chronic respiratory failure with hypoxia (HCC)   Chronic diastolic CHF (congestive heart failure) (HCC)    Family Communication:   Pt at beside Disposition Plan:   Home in 2-3 days    Procedures/Studies: Dg Chest 2 View  09/16/2015  CLINICAL DATA:  Confusion, altered mental status EXAM: CHEST  2 VIEW COMPARISON:  08/14/2015 FINDINGS: Marked cardiomegaly with mild vascular congestion. No current edema or effusion. Lungs remain clear. No focal pneumonia, collapse or consolidation. Aorta is atherosclerotic and tortuous as before. Degenerative changes of the spine. Bones are osteopenic. No effusion or pneumothorax. No significant interval change. IMPRESSION: Cardiomegaly with vascular congestion No superimposed acute process Aortic atherosclerosis and tortuosity Electronically Signed   By: Judie Petit.  Shick M.D.   On: 09/16/2015 17:14   Ct Head Wo Contrast  09/16/2015  CLINICAL DATA:  Frontal headache. Slurred speech and confusion started around 2:20 p.m. History of atrial fibrillation and COPD. EXAM: CT HEAD WITHOUT CONTRAST TECHNIQUE: Contiguous axial images were obtained from the base of the skull through the vertex without intravenous contrast. COMPARISON:  None. FINDINGS: There is mild central and cortical atrophy. Periventricular white matter changes are consistent with small vessel disease. There is encephalomalacia in the left occipital lobe consistent with old infarct. Focal ischemic changes  identified in the right parietal lobe and left frontal lobe.  An old lacunar infarct is identified within the right putamen. No associated hemorrhage. No evidence for acute infarction. Bone windows are unremarkable. Note is made of atherosclerotic calcification of the internal carotid arteries. IMPRESSION: 1. Significant atrophy and small vessel disease. 2. 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 3.  No evidence for acute intracranial abnormality.    Globe Electronically Signed   By: Norva Pavlov M.D.   On: 09/16/2015 18:53   Mr Brain Wo Contrast  09/17/2015  CLINICAL DATA:  Acute encephalopathy. Numbness and progressively worsening confusion. Slurred speech. Generalized weakness. EXAM: MRI HEAD WITHOUT CONTRAST TECHNIQUE: Multiplanar, multiecho pulse sequences of the brain and surrounding structures were obtained without intravenous contrast. COMPARISON:  09/16/2015 head CT.  08/13/2008 carotid ultrasound. FINDINGS: There is no evidence of acute infarct, intracranial hemorrhage, mass, midline shift, or extra-axial fluid collection. Chronic left frontal and left parieto-occipital cortical infarcts and a chronic right basal ganglia infarct are again noted. There is mild global cerebral atrophy. Patchy T2 hyperintensities in the cerebral white matter bilaterally separate from the areas of chronic infarction are nonspecific but compatible with moderate chronic small vessel ischemic disease. Mild chronic small vessel ischemic changes noted in the brainstem. Orbits are unremarkable. Paranasal sinuses are clear. There is a trace left mastoid effusion. Abnormal appearance of the distal left internal carotid artery is consistent with previously demonstrated occlusion. Other major intracranial vascular flow voids are preserved. IMPRESSION: 1. No acute intracranial abnormality. 2. Moderate chronic small vessel ischemic disease in the  cerebral white matter. 3. 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 4. Chronically occluded left internal carotid artery. Electronically Signed   By: Sebastian Ache M.D.   On: 09/17/2015 10:35   Dg Chest Port 1 View  10/09/2015  CLINICAL DATA:  New onset left chest pain. No radiation. Shortness of breath. Symptoms since 0200 hours today. EXAM: PORTABLE CHEST 1 VIEW COMPARISON:  09/16/2015 FINDINGS: Shallow inspiration with atelectasis in the lung bases. Possible superimposed infiltration in the right lung base. Linear atelectasis in the midlung regions bilaterally. Heart size and pulmonary vascularity are increased consistent with pulmonary vascular congestion. Peribronchial thickening suggesting chronic bronchitis. Interstitial changes in the lung bases may indicate early edema. Calcified and tortuous aorta. No blunting of costophrenic angles. No pneumothorax. IMPRESSION: Two shallow inspiration with atelectasis in the lung bases and possible superimposed infiltration in the right lung base. Cardiac enlargement with pulmonary vascular congestion and mild interstitial edema. Electronically Signed   By: Burman Nieves M.D.   On: 10/09/2015 03:55        Subjective:   Objective: Filed Vitals:   10/09/15 0915 10/09/15 0956 10/09/15 1045 10/09/15 1130  BP: 145/90  145/111 155/95  Pulse: 108  107 97  Temp:      TempSrc:      Resp: 32  31 17  Height:      Weight:      SpO2: 94% 91% 93%    No intake or output data in the 24 hours ending 10/09/15 1320 Weight change:  Exam:   General:  Pt is alert, follows commands appropriately, not in acute distress  HEENT: No icterus, No thrush, No neck mass, [place]/AT  Cardiovascular: RRR, S1/S2, no rubs, no gallops  Respiratory: CTA bilaterally, no wheezing, no crackles, no rhonchi  Abdomen: Soft/+BS, non tender, non distended, no guarding  Extremities: No edema, No lymphangitis, No petechiae, No rashes, no synovitis  Data  Reviewed: Basic  Metabolic Panel:  Recent Labs Lab 10/09/15 0347  NA 138  K 3.8  CL 97*  CO2 29  GLUCOSE 112*  BUN 14  CREATININE 1.41*  CALCIUM 8.7*   Liver Function Tests: No results for input(s): AST, ALT, ALKPHOS, BILITOT, PROT, ALBUMIN in the last 168 hours. No results for input(s): LIPASE, AMYLASE in the last 168 hours. No results for input(s): AMMONIA in the last 168 hours. CBC:  Recent Labs Lab 10/09/15 0347  WBC 8.5  NEUTROABS 7.1  HGB 13.0  HCT 40.9  MCV 95.6  PLT 227   Cardiac Enzymes:  Recent Labs Lab 10/09/15 0943  TROPONINI <0.03   BNP: Invalid input(s): POCBNP CBG:  Recent Labs Lab 10/09/15 1308  GLUCAP 204*    Recent Results (from the past 240 hour(s))  Urine culture     Status: None   Collection Time: 09/30/15  4:12 PM  Result Value Ref Range Status   Urine Culture, Routine Final report  Final   Urine Culture result 1 No growth  Final  Urine culture     Status: None   Collection Time: 10/07/15 11:00 AM  Result Value Ref Range Status   Urine Culture, Routine Final report  Final   Urine Culture result 1 No growth  Final     Scheduled Meds: . atorvastatin  40 mg Oral q1800  . busPIRone  7.5 mg Oral BID  . citalopram  40 mg Oral Daily  . diltiazem  300 mg Oral Daily  . feeding supplement (ENSURE ENLIVE)  237 mL Oral Q24H  . insulin aspart  0-5 Units Subcutaneous QHS  . insulin aspart  0-9 Units Subcutaneous TID WC  . ipratropium-albuterol  3 mL Nebulization Q6H  . linagliptin  5 mg Oral Daily  . methylPREDNISolone (SOLU-MEDROL) injection  60 mg Intravenous 3 times per day  . metoprolol  100 mg Oral BID  . mometasone-formoterol  2 puff Inhalation BID  . pantoprazole  40 mg Oral Daily  . potassium chloride SA  20 mEq Oral Daily  . [START ON 10/10/2015] vancomycin  750 mg Intravenous Q24H  . Warfarin - Pharmacist Dosing Inpatient   Does not apply q1800   Continuous Infusions: . [START ON 10/10/2015] cefTAZidime (FORTAZ)  IV     . [START ON 10/10/2015] cefTAZidime (FORTAZ)  IV       Amesha Bailey, DO  [facility] Pager [phone]  If 7PM-7AM, please contact night-coverage [email or web address] Password [email or web address] 10/09/2015, 1:20 PM   LOS: 0 days

## 2015-10-10 DIAGNOSIS — I482 Chronic atrial fibrillation: Secondary | ICD-10-CM

## 2015-10-10 LAB — BASIC METABOLIC PANEL
Anion gap: 9 (ref 5–15)
BUN: 20 mg/dL (ref 6–20)
CO2: 30 mmol/L (ref 22–32)
Calcium: 8.3 mg/dL — ABNORMAL LOW (ref 8.9–10.3)
Chloride: 96 mmol/L — ABNORMAL LOW (ref 101–111)
Creatinine, Ser: 1.7 mg/dL — ABNORMAL HIGH (ref 0.44–1.00)
GFR calc Af Amer: 32 mL/min — ABNORMAL LOW (ref >60)
GFR calc non Af Amer: 27 mL/min — ABNORMAL LOW (ref >60)
Glucose, Bld: 144 mg/dL — ABNORMAL HIGH (ref 65–99)
Potassium: 4.6 mmol/L (ref 3.5–5.1)
Sodium: 135 mmol/L (ref 135–145)

## 2015-10-10 LAB — URINE CULTURE: Culture: NO GROWTH

## 2015-10-10 LAB — GLUCOSE, CAPILLARY
Glucose-Capillary: 201 mg/dL — ABNORMAL HIGH (ref 65–99)
Glucose-Capillary: 161 mg/dL — ABNORMAL HIGH (ref 65–99)
Glucose-Capillary: 102 mg/dL — ABNORMAL HIGH (ref 65–99)
Glucose-Capillary: 126 mg/dL — ABNORMAL HIGH (ref 65–99)

## 2015-10-10 LAB — EXPECTORATED SPUTUM ASSESSMENT W GRAM STAIN, RFLX TO RESP C

## 2015-10-10 LAB — CBC
HCT: 44 % (ref 36.0–46.0)
Hemoglobin: 14.1 g/dL (ref 12.0–15.0)
MCH: 30.5 pg (ref 26.0–34.0)
MCHC: 32 g/dL (ref 30.0–36.0)
MCV: 95 fL (ref 78.0–100.0)
Platelets: 243 10*3/uL (ref 150–400)
RBC: 4.63 MIL/uL (ref 3.87–5.11)
RDW: 13.6 % (ref 11.5–15.5)
WBC: 12.5 10*3/uL — ABNORMAL HIGH (ref 4.0–10.5)

## 2015-10-10 LAB — PROTIME-INR
INR: 3.17 — ABNORMAL HIGH (ref 0.00–1.49)
Prothrombin Time: 31.9 seconds — ABNORMAL HIGH (ref 11.6–15.2)

## 2015-10-10 MED ORDER — INSULIN ASPART 100 UNIT/ML ~~LOC~~ SOLN
0.0000 [IU] | Freq: Every day | SUBCUTANEOUS | Status: DC
  Administered 2015-10-11: 2 [IU] via SUBCUTANEOUS

## 2015-10-10 MED ORDER — SENNA 8.6 MG PO TABS
2.0000 | ORAL_TABLET | Freq: Every day | ORAL | Status: DC
  Administered 2015-10-10 – 2015-10-14 (×5): 17.2 mg via ORAL
  Filled 2015-10-10 (×5): qty 2

## 2015-10-10 MED ORDER — DOCUSATE SODIUM 100 MG PO CAPS
100.0000 mg | ORAL_CAPSULE | Freq: Two times a day (BID) | ORAL | Status: DC
  Administered 2015-10-10 – 2015-10-14 (×9): 100 mg via ORAL
  Filled 2015-10-10 (×8): qty 1

## 2015-10-10 MED ORDER — CITALOPRAM HYDROBROMIDE 20 MG PO TABS
20.0000 mg | ORAL_TABLET | Freq: Every day | ORAL | Status: DC
  Administered 2015-10-11 – 2015-10-14 (×4): 20 mg via ORAL
  Filled 2015-10-10 (×4): qty 1

## 2015-10-10 MED ORDER — INSULIN ASPART 100 UNIT/ML ~~LOC~~ SOLN
0.0000 [IU] | Freq: Three times a day (TID) | SUBCUTANEOUS | Status: DC
  Administered 2015-10-10 – 2015-10-11 (×2): 3 [IU] via SUBCUTANEOUS
  Administered 2015-10-12: 8 [IU] via SUBCUTANEOUS
  Administered 2015-10-12: 3 [IU] via SUBCUTANEOUS
  Administered 2015-10-13 – 2015-10-14 (×5): 2 [IU] via SUBCUTANEOUS

## 2015-10-10 NOTE — Progress Notes (Signed)
Inpatient Diabetes Program Recommendations  AACE/ADA: New Consensus Statement on Inpatient Glycemic Control (2015)  Target Ranges:  Prepandial:   less than 140 mg/dL      Peak postprandial:   less than 180 mg/dL (1-2 hours)      Critically ill patients:  140 - 180 mg/dL   Review of Glycemic Control  Diabetes history: DM 2 Outpatient Diabetes medications: Metformin 500 mg Daily Current orders for Inpatient glycemic control: Novolog Sensitive TID + HS+ Tradjenta 5 mg Daily  Inpatient Diabetes Program Recommendations: Correction (SSI): Glucose is still elevated. Please consider increasing correction to Novolog Moderate TID.  Thanks,  Christena DeemShannon Ithzel Fedorchak RN, MSN, [facility] Inpatient Diabetes Coordinator Team Pager [phone] (8a-5p)

## 2015-10-10 NOTE — Care Management Note (Signed)
Case Management Note  Patient Details  Name: Katie FergusonDessie A Melendez MRN: [id number] Date of Birth: 1933/11/24  Subjective/Objective:                 [facility] Patient wishes to continue. [facility]    Action/Plan:   Expected Discharge Date:                  Expected Discharge Plan:  Home w Home Health Services  In-House Referral:     Discharge planning Services  CM Consult  Post Acute Care Choice:  Home Health Choice offered to:  Patient  DME Arranged:    DME Agency:     HH Arranged:    [facility]  Status of Service:  Completed, signed off  Medicare Important Message Given:    Date Medicare IM Given:    Medicare IM give by:    Date Additional Medicare IM Given:    Additional Medicare Important Message give by:     If discussed at Long Length of Stay Meetings, dates discussed:    Additional Comments:  Lawerance SabalDebbie Paxton Kanaan, RN 10/10/2015, 3:51 PM

## 2015-10-10 NOTE — Progress Notes (Signed)
ANTICOAGULATION CONSULT NOTE  Pharmacy Consult for warfarin Indication: atrial fibrillation  No Known Allergies  Patient Measurements: Height: 5' (152.4 cm) Weight: 152 lb 5.4 oz (69.1 kg) IBW/kg (Calculated) : 45.5  Vital Signs: Temp: 98.2 F (36.8 C) (10/14 0405) Temp Source: Oral (10/14 0405) BP: 154/80 mmHg (10/14 0811) Pulse Rate: 97 (10/14 0811)  Labs:  Recent Labs  10/09/15 0347 10/09/15 0753 10/09/15 0943 10/09/15 1233 10/09/15 1517 10/10/15 0514  HGB 13.0  --   --   --   --  14.1  HCT 40.9  --   --   --   --  44.0  PLT 227  --   --   --   --  243  LABPROT  --  27.5*  --   --   --  31.9*  INR  --  2.60*  --   --   --  3.17*  CREATININE 1.41*  --   --   --   --  1.70*  TROPONINI  --   --  <0.03 <0.03 <0.03  --     Estimated Creatinine Clearance: 22.9 mL/min (by C-G formula based on Cr of 1.7).   Medical History: Past Medical History  Diagnosis Date  . Hypertension   . CAD (coronary artery disease) 5/96  . Edema   . Hyperlipidemia   . Chronic atrial fibrillation (HCC)     Coumadin therapy   . Osteopenia   . Renal insufficiency   . Closed right ankle fracture   . CHF (congestive heart failure) (HCC)   . LBP (low back pain)   . Arthritis   . Cataract   . COPD (chronic obstructive pulmonary disease) (HCC)     wears O2 at home as needed  . Esophageal stricture   . Esophageal dysmotility 09/2011    seen on barium esophagram.  . Carotid artery occlusion   . Myocardial infarction [facility](HCC)    Assessment: 81yo female c/o CP and SOB, now free from CP but still SOB at rest, CXR shows possible superimposed infiltration in right lung base, to begin IV ABX; also to continue Coumadin for Afib, current INR unknown though was 3.1 at OV on 10/10 w/ instruction to skip one day of Coumadin, last dose 10/12 pta. CHA2DS2VASC 7.  INR now slightly supratherapeutic again 2.6>>3.17 today after 2.5mg  dose last night. Will not give another dose tonight due to large bump in  INR. May need lower dose tomorrow. CBC wnl  PTA warfarin dose: 5mg  on Tues, Fri. 2.5mg  all other days  Goal of Therapy:  INR 2-3 Monitor platelets by anticoagulation protocol: Yes   Plan:  Hold warfarin dose tonight Daily INR, mon s/sx bleeding  Babs BertinHaley Tashina Melendez, PharmD Clinical Pharmacist Pager [phone] 10/10/2015 9:03 AM

## 2015-10-10 NOTE — Discharge Instructions (Signed)

## 2015-10-10 NOTE — Progress Notes (Signed)
Physical Therapy Treatment Patient Details Name: Katie FergusonDessie A Melendez MRN: [id number] DOB: [birth year] Today's Date: 10/10/2015    History of Present Illness [name] is a 81 y.o. female with a past medical history significant for COPD on 2L home O2, CAD status post PCI 2, HTN, atrial fibrillation on warfarin, CKD stage IV, chronic diastolic CHF, and active smoking who presents with chest discomfort, cough and fever.    PT Comments    Slow progress.  Follow Up Recommendations  Home health PT;Supervision for mobility/OOB (Initially - which pt reports can be provided by nephew/niece)     Equipment Recommendations  None recommended by PT    Recommendations for Other Services OT consult     Precautions / Restrictions Precautions Precautions: Fall Restrictions Weight Bearing Restrictions: No    Mobility  Bed Mobility Overal bed mobility: Needs Assistance Bed Mobility: Rolling;Sidelying to Sit Rolling: Supervision Sidelying to sit: Supervision;HOB elevated       General bed mobility comments: Incr time  Transfers Overall transfer level: Needs assistance Equipment used: 4-wheeled walker Transfers: Sit to/from Stand Sit to Stand: Supervision         General transfer comment: supervision for safety  Ambulation/Gait Ambulation/Gait assistance: Supervision Ambulation Distance (Feet): 150 Feet Assistive device: 4-wheeled walker Gait Pattern/deviations: Step-through pattern;Decreased stride length Gait velocity: decreased Gait velocity interpretation: Below normal speed for age/gender General Gait Details: Good control of rollator. Dyspnea 2-3/4 on 3L   Stairs            Wheelchair Mobility    Modified Rankin (Stroke Patients Only)       Balance     Sitting balance-Leahy Scale: Good       Standing balance-Leahy Scale: Fair                      Cognition Arousal/Alertness: Awake/alert Behavior During Therapy: WFL for tasks  assessed/performed Overall Cognitive Status: Within Functional Limits for tasks assessed                      Exercises      General Comments        Pertinent Vitals/Pain      Home Living                      Prior Function            PT Goals (current goals can now be found in the care plan section) Acute Rehab PT Goals Patient Stated Goal: Go home PT Goal Formulation: With patient Time For Goal Achievement: 10/01/15 Potential to Achieve Goals: Good Progress towards PT goals: Progressing toward goals    Frequency  Min 3X/week    PT Plan Current plan remains appropriate;Frequency needs to be updated    Co-evaluation             End of Session Equipment Utilized During Treatment: Oxygen Activity Tolerance: Patient tolerated treatment well Patient left: in chair;with call bell/phone within reach;with chair alarm set     Time: [id number] PT Time Calculation (min) (ACUTE ONLY): 16 min  Charges:  $Gait Training: 8-22 mins                    G Codes:      Rayden Dock 10/10/2015, 4:29 PM [employer] CorporationCary Laurelin Elson PT [phone]

## 2015-10-10 NOTE — Progress Notes (Signed)
PROGRESS NOTE  Katie FergusonDessie A Kary Melendez:[id number]:[id number] DOB: 20-Jan-1933 DOA: 10/09/2015 PCP: Katie PieriniMARTIN,[name], [name]   Brief history 81 year old female with a history of COPD, chronic respiratory failure, CAD s/p PCI x 2, hypertension, chronic atrial fibrillation, CKD stage III-4, diastolic CHF, tobacco abuse presented with 1-2 day history of left-sided chest pain or shortness of breath. The patient has been having a worsening cough with yellow sputum production. There is no hemoptysis. The patient has had subjective fevers, although she did take her temperature at home prior to admission. Her highest temperature was 100.71F. Unfortunately, the patient continues to smoke one half pack per day. She has at least a 60-pack-year history. The patient is on when necessary oxygen, but her home health nurse has been telling her that she needs to wear her oxygen 24 /7. Apparently, the patient has been recently treated with ciprofloxacin and then amoxicillin for unresolved UTIs. She presently denies any dysuria or hematuria. Assessment/Plan: Acute on chronic respiratory failure with hypoxia -Secondary to HCAP and COPD exacerbation -Presently stable on 2 L nasal cannula -Weaned oxygen back to her home dose for saturation greater than 92% HCAP -Continue vancomycin and ceftazidime -Follow blood cultures--neg to date COPD exacerbation -The patient continues to have diffuse wheezing but is not in distress -continue intravenous Solu-Medrol -continue duonebs -continue dulera Atypical chest pain/CAD -EKG with nonspecific T-wave changes -No symptoms of angina -Continue metoprolol tartrate -finish cycling troponins--neg x 3 Chronic diastolic CHF -07/30/2015 echo shows EF 50-55%, grade 3 DD, severe LAE -clinically compensated Chronic atrial fibrillation -Rate is marginally controlled at this point -Secondary to her pneumonia and COPD exacerbation -Continue diltiazem -Continue warfarin -CHADS-VASc =  7 Diabetes mellitus type 2 -Discontinue metformin -09/17/2015 hemoglobin A1c 6.4 -Continue Tradjenta for now, but a case could be made to discontinue all agents altogether given the patient's age and level of hyperglycemia -increase to moderate SSI Hypertension -Continue metoprolol tartrate, diltiazem Hyperlipidemia -Continue statin   Family Communication:   Pt at beside Disposition Plan:   Home  2-3 days       Procedures/Studies: Dg Chest 2 View  09/16/2015  CLINICAL DATA:  Confusion, altered mental status EXAM: CHEST  2 VIEW COMPARISON:  08/14/2015 FINDINGS: Marked cardiomegaly with mild vascular congestion. No current edema or effusion. Lungs remain clear. No focal pneumonia, collapse or consolidation. Aorta is atherosclerotic and tortuous as before. Degenerative changes of the spine. Bones are osteopenic. No effusion or pneumothorax. No significant interval change. IMPRESSION: Cardiomegaly with vascular congestion No superimposed acute process Aortic atherosclerosis and tortuosity Electronically Signed   By: Judie PetitM.  Shick M.D.   On: 09/16/2015 17:14   Ct Head Wo Contrast  09/16/2015  CLINICAL DATA:  Frontal headache. Slurred speech and confusion started around 2:20 p.m. History of atrial fibrillation and COPD. EXAM: CT HEAD WITHOUT CONTRAST TECHNIQUE: Contiguous axial images were obtained from the base of the skull through the vertex without intravenous contrast. COMPARISON:  None. FINDINGS: There is mild central and cortical atrophy. Periventricular white matter changes are consistent with small vessel disease. There is encephalomalacia in the left occipital lobe consistent with old infarct. Focal ischemic changes identified in the right parietal lobe and left frontal lobe. An old lacunar infarct is identified within the right putamen. No associated hemorrhage. No evidence for acute infarction. Bone windows are unremarkable. Note is made of atherosclerotic calcification of the internal  carotid arteries. IMPRESSION: 1. Significant atrophy and small vessel disease. 2. Multiple old infarcts involving  the right basal ganglia, left occipital lobe, right parietal lobe, and left frontal lobe. 3.  No evidence for acute intracranial abnormality.    Globe Electronically Signed   By: Norva Pavlov M.D.   On: 09/16/2015 18:53   Mr Brain Wo Contrast  09/17/2015  CLINICAL DATA:  Acute encephalopathy. Numbness and progressively worsening confusion. Slurred speech. Generalized weakness. EXAM: MRI HEAD WITHOUT CONTRAST TECHNIQUE: Multiplanar, multiecho pulse sequences of the brain and surrounding structures were obtained without intravenous contrast. COMPARISON:  09/16/2015 head CT.  08/13/2008 carotid ultrasound. FINDINGS: There is no evidence of acute infarct, intracranial hemorrhage, mass, midline shift, or extra-axial fluid collection. Chronic left frontal and left parieto-occipital cortical infarcts and a chronic right basal ganglia infarct are again noted. There is mild global cerebral atrophy. Patchy T2 hyperintensities in the cerebral white matter bilaterally separate from the areas of chronic infarction are nonspecific but compatible with moderate chronic small vessel ischemic disease. Mild chronic small vessel ischemic changes noted in the brainstem. Orbits are unremarkable. Paranasal sinuses are clear. There is a trace left mastoid effusion. Abnormal appearance of the distal left internal carotid artery is consistent with previously demonstrated occlusion. Other major intracranial vascular flow voids are preserved. IMPRESSION: 1. No acute intracranial abnormality. 2. Moderate chronic small vessel ischemic disease in the cerebral white matter. 3. 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 4. Chronically occluded left internal carotid artery. Electronically Signed   By: Sebastian Ache M.D.   On: 09/17/2015 10:35   Dg Chest Port 1 View  10/09/2015  CLINICAL DATA:  New onset left  chest pain. No radiation. Shortness of breath. Symptoms since 0200 hours today. EXAM: PORTABLE CHEST 1 VIEW COMPARISON:  09/16/2015 FINDINGS: Shallow inspiration with atelectasis in the lung bases. Possible superimposed infiltration in the right lung base. Linear atelectasis in the midlung regions bilaterally. Heart size and pulmonary vascularity are increased consistent with pulmonary vascular congestion. Peribronchial thickening suggesting chronic bronchitis. Interstitial changes in the lung bases may indicate early edema. Calcified and tortuous aorta. No blunting of costophrenic angles. No pneumothorax. IMPRESSION: Two shallow inspiration with atelectasis in the lung bases and possible superimposed infiltration in the right lung base. Cardiac enlargement with pulmonary vascular congestion and mild interstitial edema. Electronically Signed   By: Burman Nieves M.D.   On: 10/09/2015 03:55         Subjective: Patient states that she is breathing better but still has some dyspnea on exertion. Denies any fevers, chills, chest discomfort, nausea, vomiting, diarrhea, abdominal pain. No dysuria or hematuria. No hematochezia or melena.  Objective: Filed Vitals:   10/10/15 0405 10/10/15 0811 10/10/15 0825 10/10/15 1335  BP: 130/60 154/80  152/91  Pulse: 85 97  94  Temp: 98.2 F (36.8 C)   98.9 F (37.2 C)  TempSrc: Oral   Oral  Resp: 18   18  Height:      Weight: 69.1 kg (152 lb 5.4 oz)     SpO2: 93% 97% 92% 94%    Intake/Output Summary (Last 24 hours) at 10/10/15 1410 Last data filed at 10/10/15 1358  Gross per 24 hour  Intake    770 ml  Output   1200 ml  Net   -430 ml   Weight change: -2.268 kg (-5 lb) Exam:   General:  Pt is alert, follows commands appropriately, not in acute distress  HEENT: No icterus, No thrush, No neck mass, [place]/AT  Cardiovascular: RRR, S1/S2, no rubs, no gallops  Respiratory: Bibasilar rhonchi, right greater  than left. Bilateral expiratory wheeze. Good  air movement.  Abdomen: Soft/+BS, non tender, non distended, no guarding; no hepatosplenomegaly  Extremities: trace edema, No lymphangitis, No petechiae, No rashes, no synovitis; clubbing noted without any cyanosis  Data Reviewed: Basic Metabolic Panel:  Recent Labs Lab 10/09/15 0347 10/10/15 0514  NA 138 135  K 3.8 4.6  CL 97* 96*  CO2 29 30  GLUCOSE 112* 144*  BUN 14 20  CREATININE 1.41* 1.70*  CALCIUM 8.7* 8.3*   Liver Function Tests: No results for input(s): AST, ALT, ALKPHOS, BILITOT, PROT, ALBUMIN in the last 168 hours. No results for input(s): LIPASE, AMYLASE in the last 168 hours. No results for input(s): AMMONIA in the last 168 hours. CBC:  Recent Labs Lab 10/09/15 0347 10/10/15 0514  WBC 8.5 12.5*  NEUTROABS 7.1  --   HGB 13.0 14.1  HCT 40.9 44.0  MCV 95.6 95.0  PLT 227 243   Cardiac Enzymes:  Recent Labs Lab 10/09/15 0943 10/09/15 1233 10/09/15 1517  TROPONINI <0.03 <0.03 <0.03   BNP: Invalid input(s): POCBNP CBG:  Recent Labs Lab 10/09/15 1413 10/09/15 1732 10/09/15 2200 10/10/15 0755 10/10/15 1213  GLUCAP 162* 136* 244* 201* 161*    Recent Results (from the past 240 hour(s))  Urine culture     Status: None   Collection Time: 09/30/15  4:12 PM  Result Value Ref Range Status   Urine Culture, Routine Final report  Final   Urine Culture result 1 No growth  Final  Urine culture     Status: None   Collection Time: 10/07/15 11:00 AM  Result Value Ref Range Status   Urine Culture, Routine Final report  Final   Urine Culture result 1 No growth  Final  Urine culture     Status: None   Collection Time: 10/09/15  1:20 PM  Result Value Ref Range Status   Specimen Description URINE, CLEAN CATCH  Final   Special Requests NONE  Final   Culture NO GROWTH 1 DAY  Final   Report Status 10/10/2015 FINAL  Final  Culture, expectorated sputum-assessment     Status: None   Collection Time: 10/10/15 12:12 PM  Result Value Ref Range Status    Specimen Description EXPECTORATED SPUTUM  Final   Special Requests NONE  Final   Sputum evaluation   Final    MICROSCOPIC FINDINGS SUGGEST THAT THIS SPECIMEN IS NOT REPRESENTATIVE OF LOWER RESPIRATORY SECRETIONS. PLEASE RECOLLECT. Smith,V RN @ 1302 10/10/15 Leonard,A    Report Status 10/10/2015 FINAL  Final     Scheduled Meds: . atorvastatin  40 mg Oral q1800  . busPIRone  7.5 mg Oral BID  . cefTAZidime (FORTAZ)  IV  2 g Intravenous Q24H  . citalopram  40 mg Oral Daily  . diltiazem  300 mg Oral Daily  . docusate sodium  100 mg Oral BID  . feeding supplement (ENSURE ENLIVE)  237 mL Oral Q24H  . insulin aspart  0-15 Units Subcutaneous TID WC  . insulin aspart  0-5 Units Subcutaneous QHS  . ipratropium-albuterol  3 mL Nebulization Q6H  . linagliptin  5 mg Oral Daily  . methylPREDNISolone (SOLU-MEDROL) injection  60 mg Intravenous 3 times per day  . metoprolol  100 mg Oral BID  . mometasone-formoterol  2 puff Inhalation BID  . pantoprazole  40 mg Oral Daily  . potassium chloride SA  20 mEq Oral Daily  . senna  2 tablet Oral Daily  . vancomycin  750 mg Intravenous Q24H  .  Warfarin - Pharmacist Dosing Inpatient   Does not apply q1800   Continuous Infusions:    Syon Tews, DO  [facility] Pager [phone]  If 7PM-7AM, please contact night-coverage [email or web address] Password [email or web address] 10/10/2015, 2:10 PM   LOS: 1 day

## 2015-10-11 LAB — CBC
HCT: 42.6 % (ref 36.0–46.0)
Hemoglobin: 13.8 g/dL (ref 12.0–15.0)
MCH: 30.4 pg (ref 26.0–34.0)
MCHC: 32.4 g/dL (ref 30.0–36.0)
MCV: 93.8 fL (ref 78.0–100.0)
Platelets: 253 10*3/uL (ref 150–400)
RBC: 4.54 MIL/uL (ref 3.87–5.11)
RDW: 13.8 % (ref 11.5–15.5)
WBC: 14.9 10*3/uL — ABNORMAL HIGH (ref 4.0–10.5)

## 2015-10-11 LAB — BASIC METABOLIC PANEL
Anion gap: 10 (ref 5–15)
BUN: 29 mg/dL — ABNORMAL HIGH (ref 6–20)
CO2: 30 mmol/L (ref 22–32)
Calcium: 8.7 mg/dL — ABNORMAL LOW (ref 8.9–10.3)
Chloride: 95 mmol/L — ABNORMAL LOW (ref 101–111)
Creatinine, Ser: 1.61 mg/dL — ABNORMAL HIGH (ref 0.44–1.00)
GFR calc Af Amer: 34 mL/min — ABNORMAL LOW (ref >60)
GFR calc non Af Amer: 29 mL/min — ABNORMAL LOW (ref >60)
Glucose, Bld: 140 mg/dL — ABNORMAL HIGH (ref 65–99)
Potassium: 4.7 mmol/L (ref 3.5–5.1)
Sodium: 135 mmol/L (ref 135–145)

## 2015-10-11 LAB — GLUCOSE, CAPILLARY
Glucose-Capillary: 162 mg/dL — ABNORMAL HIGH (ref 65–99)
Glucose-Capillary: 195 mg/dL — ABNORMAL HIGH (ref 65–99)
Glucose-Capillary: 185 mg/dL — ABNORMAL HIGH (ref 65–99)
Glucose-Capillary: 201 mg/dL — ABNORMAL HIGH (ref 65–99)

## 2015-10-11 LAB — PROTIME-INR
INR: 2.92 — ABNORMAL HIGH (ref 0.00–1.49)
Prothrombin Time: 30 seconds — ABNORMAL HIGH (ref 11.6–15.2)

## 2015-10-11 LAB — PROCALCITONIN: Procalcitonin: <0.1 ng/mL

## 2015-10-11 MED ORDER — BUDESONIDE 0.25 MG/2ML IN SUSP: 0.2500 mg | Freq: Two times a day (BID) | RESPIRATORY_TRACT | Status: DC

## 2015-10-11 NOTE — Progress Notes (Signed)
ANTICOAGULATION CONSULT NOTE - Follow Up Consult  Pharmacy Consult for warfarin Indication: atrial fibrillation  No Known Allergies  Patient Measurements: Height: 5' (152.4 cm) Weight: 154 lb 5.2 oz (70 kg) IBW/kg (Calculated) : 45.5  Vital Signs: Temp: 98.6 F (37 C) (10/15 0556) Temp Source: Oral (10/15 0556) BP: 125/73 mmHg (10/15 0556) Pulse Rate: 88 (10/15 0556)  Labs:  Recent Labs  10/09/15 0347 10/09/15 0753 10/09/15 0943 10/09/15 1233 10/09/15 1517 10/10/15 0514 10/11/15 0523  HGB 13.0  --   --   --   --  14.1 13.8  HCT 40.9  --   --   --   --  44.0 42.6  PLT 227  --   --   --   --  243 253  LABPROT  --  27.5*  --   --   --  31.9* 30.0*  INR  --  2.60*  --   --   --  3.17* 2.92*  CREATININE 1.41*  --   --   --   --  1.70* 1.61*  TROPONINI  --   --  <0.03 <0.03 <0.03  --   --     Estimated Creatinine Clearance: 24.3 mL/min (by C-G formula based on Cr of 1.61).   Assessment: 82yoF admitted 10/09/2015 on Coumadin PTA for Afib. CHA2DS2VASc 7. INR bumped significantly after 2.5mg  dose on 10/13 from 2.6>>3.17. Held 10/14 dose.  INR still elevated 2.92 (therapeutic), H/H stable, Plts wnl. No s/sx bleeding noted.  Goal of Therapy:  INR 2-3 Monitor platelets by anticoagulation protocol: Yes   Plan:  - Will hold tonight's dose - Monitor daily INR, CBC, s/sx of bleeding  Casilda Carlsaylor Jerrold Haskell, PharmD. Clinical Pharmacist Resident Pager: [phone]

## 2015-10-11 NOTE — Progress Notes (Signed)
PROGRESS NOTE  Katie FergusonDessie A Melendez ZOX:[id number] DOB: August 29, 1933 DOA: 10/09/2015 PCP: Bennie PieriniMARTIN,[name]  Brief history 81 year old female with a history of COPD, chronic respiratory failure, CAD s/p PCI x 2, hypertension, chronic atrial fibrillation, CKD stage III-4, diastolic CHF, tobacco abuse presented with 1-2 day history of left-sided chest pain or shortness of breath. The patient has been having a worsening cough with yellow sputum production. There is no hemoptysis. The patient has had subjective fevers, although she did take her temperature at home prior to admission. Her highest temperature was 100.24F. Unfortunately, the patient continues to smoke one half pack per day. She has at least a 60-pack-year history. The patient is on when necessary oxygen, but her home health nurse has been telling her that she needs to wear her oxygen 24 /7. Apparently, the patient has been recently treated with ciprofloxacin and then amoxicillin for unresolved UTIs. She presently denies any dysuria or hematuria. Assessment/Plan: Acute on chronic respiratory failure with hypoxia -Secondary to HCAP and COPD exacerbation -Presently stable on 2 L nasal cannula -Weaned oxygen back to her home dose for saturation greater than 92% HCAP -Continue ceftazidime -d/c vancomycin -Follow blood cultures--neg to date COPD exacerbation -The patient continues to have diffuse wheezing but is not in distress -continue intravenous Solu-Medrol -continue duonebs -add budesonide nebulizer -overall slow improvement -Much of her wheezing is upper airway in origin Atypical chest pain/CAD -EKG with nonspecific T-wave changes -No symptoms of angina -Continue metoprolol tartrate -finish cycling troponins--neg x 3 Chronic diastolic CHF -07/30/2015 echo shows EF 50-55%, grade 3 DD, severe LAE -clinically compensated Chronic atrial fibrillation -Rate is marginally controlled at this point -Secondary to her  pneumonia and COPD exacerbation -Continue diltiazem -Continue warfarin -CHADS-VASc = 7 Diabetes mellitus type 2 -Discontinue metformin -09/17/2015 hemoglobin A1c 6.4 -Continue Tradjenta for now, but a case could be made to discontinue all agents altogether given the patient's age and level of hyperglycemia -increase to moderate SSI Hypertension -Continue metoprolol tartrate, diltiazem Hyperlipidemia -Continue statin  Family Communication: Pt at beside Disposition Plan: Home 1-2 days       Procedures/Studies: Dg Chest 2 View  09/16/2015  CLINICAL DATA:  Confusion, altered mental status EXAM: CHEST  2 VIEW COMPARISON:  08/14/2015 FINDINGS: Marked cardiomegaly with mild vascular congestion. No current edema or effusion. Lungs remain clear. No focal pneumonia, collapse or consolidation. Aorta is atherosclerotic and tortuous as before. Degenerative changes of the spine. Bones are osteopenic. No effusion or pneumothorax. No significant interval change. IMPRESSION: Cardiomegaly with vascular congestion No superimposed acute process Aortic atherosclerosis and tortuosity Electronically Signed   By: Judie PetitM.  Shick M.D.   On: 09/16/2015 17:14   Ct Head Wo Contrast  09/16/2015  CLINICAL DATA:  Frontal headache. Slurred speech and confusion started around 2:20 p.m. History of atrial fibrillation and COPD. EXAM: CT HEAD WITHOUT CONTRAST TECHNIQUE: Contiguous axial images were obtained from the base of the skull through the vertex without intravenous contrast. COMPARISON:  None. FINDINGS: There is mild central and cortical atrophy. Periventricular white matter changes are consistent with small vessel disease. There is encephalomalacia in the left occipital lobe consistent with old infarct. Focal ischemic changes identified in the right parietal lobe and left frontal lobe. An old lacunar infarct is identified within the right putamen. No associated hemorrhage. No evidence for acute infarction. Bone  windows are unremarkable. Note is made of atherosclerotic calcification of the internal carotid arteries. IMPRESSION: 1. Significant atrophy and small vessel  disease. 2. 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 3.  No evidence for acute intracranial abnormality.    Globe Electronically Signed   By: Norva Pavlov M.D.   On: 09/16/2015 18:53   Mr Brain Wo Contrast  09/17/2015  CLINICAL DATA:  Acute encephalopathy. Numbness and progressively worsening confusion. Slurred speech. Generalized weakness. EXAM: MRI HEAD WITHOUT CONTRAST TECHNIQUE: Multiplanar, multiecho pulse sequences of the brain and surrounding structures were obtained without intravenous contrast. COMPARISON:  09/16/2015 head CT.  08/13/2008 carotid ultrasound. FINDINGS: There is no evidence of acute infarct, intracranial hemorrhage, mass, midline shift, or extra-axial fluid collection. Chronic left frontal and left parieto-occipital cortical infarcts and a chronic right basal ganglia infarct are again noted. There is mild global cerebral atrophy. Patchy T2 hyperintensities in the cerebral white matter bilaterally separate from the areas of chronic infarction are nonspecific but compatible with moderate chronic small vessel ischemic disease. Mild chronic small vessel ischemic changes noted in the brainstem. Orbits are unremarkable. Paranasal sinuses are clear. There is a trace left mastoid effusion. Abnormal appearance of the distal left internal carotid artery is consistent with previously demonstrated occlusion. Other major intracranial vascular flow voids are preserved. IMPRESSION: 1. No acute intracranial abnormality. 2. Moderate chronic small vessel ischemic disease in the cerebral white matter. 3. 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 4. Chronically occluded left internal carotid artery. Electronically Signed   By: Sebastian Ache M.D.   On:  09/17/2015 10:35   Dg Chest Port 1 View  10/09/2015  CLINICAL DATA:  New onset left chest pain. No radiation. Shortness of breath. Symptoms since 0200 hours today. EXAM: PORTABLE CHEST 1 VIEW COMPARISON:  09/16/2015 FINDINGS: Shallow inspiration with atelectasis in the lung bases. Possible superimposed infiltration in the right lung base. Linear atelectasis in the midlung regions bilaterally. Heart size and pulmonary vascularity are increased consistent with pulmonary vascular congestion. Peribronchial thickening suggesting chronic bronchitis. Interstitial changes in the lung bases may indicate early edema. Calcified and tortuous aorta. No blunting of costophrenic angles. No pneumothorax. IMPRESSION: Two shallow inspiration with atelectasis in the lung bases and possible superimposed infiltration in the right lung base. Cardiac enlargement with pulmonary vascular congestion and mild interstitial edema. Electronically Signed   By: Burman Nieves M.D.   On: 10/09/2015 03:55        Subjective: Overall, patient is breathing better but continues to complain of dyspnea on exertion. Denies any fevers, chills, chest pain, nausea, vomiting, diarrhea. Denies any hemoptysis. No medication or melena. No dysuria or hematuria.  Objective: Filed Vitals:   10/10/15 2020 10/10/15 2228 10/11/15 0307 10/11/15 0556  BP:  145/90  125/73  Pulse:  80  88  Temp:  98.1 F (36.7 C)  98.6 F (37 C)  TempSrc:  Oral  Oral  Resp:  22  20  Height:      Weight:    70 kg (154 lb 5.2 oz)  SpO2: 91% 93% 90% 92%    Intake/Output Summary (Last 24 hours) at 10/11/15 0825 Last data filed at 10/11/15 0634  Gross per 24 hour  Intake    910 ml  Output    500 ml  Net    410 ml   Weight change: 1.96 kg (4 lb 5.2 oz) Exam:   General:  Pt is alert, follows commands appropriately, not in acute distress  HEENT: No icterus, No thrush, No neck mass, [place]/AT  Cardiovascular: IRRR, S1/S2, no  rubs, no gallops  Respiratory:  Bibasilar rales, right greater than left. Bibasilar wheezing.  Abdomen: Soft/+BS, non tender, non distended, no guarding  Extremities: No edema, No lymphangitis, No petechiae, No rashes, no synovitis  Data Reviewed: Basic Metabolic Panel:  Recent Labs Lab 10/09/15 0347 10/10/15 0514 10/11/15 0523  NA 138 135 135  K 3.8 4.6 4.7  CL 97* 96* 95*  CO2 GLUCOSE 112* 144* 140*  BUN 14 20 29*  CREATININE 1.41* 1.70* 1.61*  CALCIUM 8.7* 8.3* 8.7*   Liver Function Tests: No results for input(s): AST, ALT, ALKPHOS, BILITOT, PROT, ALBUMIN in the last 168 hours. No results for input(s): LIPASE, AMYLASE in the last 168 hours. No results for input(s): AMMONIA in the last 168 hours. CBC:  Recent Labs Lab 10/09/15 0347 10/10/15 0514 10/11/15 0523  WBC 8.5 12.5* 14.9*  NEUTROABS 7.1  --   --   HGB 13.0 14.1 13.8  HCT 40.9 44.0 42.6  MCV 95.6 95.0 93.8  PLT 227 243 253   Cardiac Enzymes:  Recent Labs Lab 10/09/15 0943 10/09/15 1233 10/09/15 1517  TROPONINI <0.03 <0.03 <0.03   BNP: Invalid input(s): POCBNP CBG:  Recent Labs Lab 10/10/15 0755 10/10/15 1213 10/10/15 1639 10/10/15 2230 10/11/15 0749  GLUCAP 201* 161* 102* 126* 162*    Recent Results (from the past 240 hour(s))  Urine culture     Status: None   Collection Time: 10/07/15 11:00 AM  Result Value Ref Range Status   Urine Culture, Routine Final report  Final   Urine Culture result 1 No growth  Final  Culture, blood (routine x 2)     Status: None (Preliminary result)   Collection Time: 10/09/15  4:50 AM  Result Value Ref Range Status   Specimen Description BLOOD HAND RIGHT  Final   Special Requests BOTTLES DRAWN AEROBIC AND ANAEROBIC 10CC  Final   Culture NO GROWTH 1 DAY  Final   Report Status PENDING  Incomplete  Culture, blood (routine x 2)     Status: None (Preliminary result)   Collection Time: 10/09/15  4:50 AM  Result Value Ref Range Status   Specimen Description BLOOD ARM LEFT  Final    Special Requests BOTTLES DRAWN AEROBIC ONLY 10CC  Final   Culture NO GROWTH 1 DAY  Final   Report Status PENDING  Incomplete  Urine culture     Status: None   Collection Time: 10/09/15  1:20 PM  Result Value Ref Range Status   Specimen Description URINE, CLEAN CATCH  Final   Special Requests NONE  Final   Culture NO GROWTH 1 DAY  Final   Report Status 10/10/2015 FINAL  Final  Culture, expectorated sputum-assessment     Status: None   Collection Time: 10/10/15 12:12 PM  Result Value Ref Range Status   Specimen Description EXPECTORATED SPUTUM  Final   Special Requests NONE  Final   Sputum evaluation   Final    MICROSCOPIC FINDINGS SUGGEST THAT THIS SPECIMEN IS NOT REPRESENTATIVE OF LOWER RESPIRATORY SECRETIONS. PLEASE RECOLLECT. Smith,V RN @ 1302 10/10/15 Leonard,A    Report Status 10/10/2015 FINAL  Final     Scheduled Meds: . atorvastatin  40 mg Oral q1800  . budesonide (PULMICORT) nebulizer solution  0.25 mg Nebulization BID  . busPIRone  7.5 mg Oral BID  . cefTAZidime (FORTAZ)  IV  2 g Intravenous Q24H  . citalopram  20 mg Oral Daily  . diltiazem  300 mg Oral Daily  .  docusate sodium  100 mg Oral BID  . feeding supplement (ENSURE ENLIVE)  237 mL Oral Q24H  . insulin aspart  0-15 Units Subcutaneous TID WC  . insulin aspart  0-5 Units Subcutaneous QHS  . ipratropium-albuterol  3 mL Nebulization Q6H  . linagliptin  5 mg Oral Daily  . methylPREDNISolone (SOLU-MEDROL) injection  60 mg Intravenous 3 times per day  . metoprolol  100 mg Oral BID  . pantoprazole  40 mg Oral Daily  . potassium chloride SA  20 mEq Oral Daily  . senna  2 tablet Oral Daily  . Warfarin - Pharmacist Dosing Inpatient   Does not apply q1800   Continuous Infusions:    Tekila Caillouet, DO  [facility] Pager [phone]  If 7PM-7AM, please contact night-coverage [email or web address] Password [email or web address] 10/11/2015, 8:25 AM   LOS: 2 days

## 2015-10-12 LAB — GLUCOSE, CAPILLARY
Glucose-Capillary: 143 mg/dL — ABNORMAL HIGH (ref 65–99)
Glucose-Capillary: 289 mg/dL — ABNORMAL HIGH (ref 65–99)
Glucose-Capillary: 70 mg/dL (ref 65–99)
Glucose-Capillary: 85 mg/dL (ref 65–99)
Glucose-Capillary: 139 mg/dL — ABNORMAL HIGH (ref 65–99)

## 2015-10-12 LAB — PROTIME-INR
INR: 2.89 — ABNORMAL HIGH (ref 0.00–1.49)
Prothrombin Time: 29.7 seconds — ABNORMAL HIGH (ref 11.6–15.2)

## 2015-10-12 MED ORDER — IPRATROPIUM-ALBUTEROL 0.5-2.5 (3) MG/3ML IN SOLN
3.0000 mL | Freq: Three times a day (TID) | RESPIRATORY_TRACT | Status: DC
  Administered 2015-10-13 – 2015-10-14 (×4): 3 mL via RESPIRATORY_TRACT
  Filled 2015-10-12 (×4): qty 3

## 2015-10-12 MED ORDER — BUDESONIDE 0.25 MG/2ML IN SUSP
0.2500 mg | Freq: Two times a day (BID) | RESPIRATORY_TRACT | Status: DC
  Administered 2015-10-12 – 2015-10-14 (×5): 0.25 mg via RESPIRATORY_TRACT
  Filled 2015-10-12 (×4): qty 2

## 2015-10-12 MED ORDER — PREDNISONE 50 MG PO TABS
60.0000 mg | ORAL_TABLET | Freq: Two times a day (BID) | ORAL | Status: DC
  Administered 2015-10-13 – 2015-10-14 (×3): 60 mg via ORAL
  Filled 2015-10-12 (×6): qty 1

## 2015-10-12 MED ORDER — METHYLPREDNISOLONE SODIUM SUCC 125 MG IJ SOLR: 60.0000 mg | Freq: Two times a day (BID) | INTRAMUSCULAR | Status: DC

## 2015-10-12 NOTE — Progress Notes (Signed)
PROGRESS NOTE  [name] ZOX:[id number] DOB: 1933/01/22 DOA: 10/09/2015 PCP: Bennie Pierini, [name]   Brief history 81 year old female with a history of COPD, chronic respiratory failure, CAD s/p PCI x 2, hypertension, chronic atrial fibrillation, CKD stage III-4, diastolic CHF, tobacco abuse presented with 1-2 day history of left-sided chest pain or shortness of breath. The patient has been having a worsening cough with yellow sputum production. There is no hemoptysis. The patient has had subjective fevers, although she did take her temperature at home prior to admission. Her highest temperature was 100.33F. Unfortunately, the patient continues to smoke one half pack per day. She has at least a 60-pack-year history. The patient is on when necessary oxygen, but her home health nurse has been telling her that she needs to wear her oxygen 24 /7. Apparently, the patient has been recently treated with ciprofloxacin and then amoxicillin for unresolved UTIs. She presently denies any dysuria or hematuria. Assessment/Plan: Acute on chronic respiratory failure with hypoxia -Secondary to HCAP and COPD exacerbation -Presently stable on 2 L nasal cannula -Weaned oxygen back to her home dose for saturation greater than 92% HCAP -Continue ceftazidime D#4 -d/c vancomycin -Follow blood cultures--neg to date COPD exacerbation -The patient continues to have diffuse wheezing but is not in distress -continue intravenous Solu-Medrol -continue duonebs -add budesonide nebulizer -overall slow improvement -Much of her wheezing is upper airway in origin Atypical chest pain/CAD -EKG with nonspecific T-wave changes -No symptoms of angina -Continue metoprolol tartrate -finish cycling troponins--neg x 3 Chronic diastolic CHF -07/30/2015 echo shows EF 50-55%, grade 3 DD, severe LAE -clinically compensated Chronic atrial fibrillation -Rate is marginally controlled at this point -Secondary to her  pneumonia and COPD exacerbation -Continue diltiazem and metoprolol -Continue warfarin -CHADS-VASc = 7 Diabetes mellitus type 2 -Discontinue metformin -09/17/2015 hemoglobin A1c 6.4 -Continue Tradjenta for now, but a case could be made to discontinue all agents altogether given the patient's age and level of hyperglycemia -increase to moderate SSI due to steroids Hypertension -Continue metoprolol tartrate, diltiazem Hyperlipidemia -Continue statin  Family Communication: Nephew updated beside Disposition Plan: Anticipate 10/18 if continues to improve   Procedures/Studies: Dg Chest 2 View  09/16/2015  CLINICAL DATA:  Confusion, altered mental status EXAM: CHEST  2 VIEW COMPARISON:  08/14/2015 FINDINGS: Marked cardiomegaly with mild vascular congestion. No current edema or effusion. Lungs remain clear. No focal pneumonia, collapse or consolidation. Aorta is atherosclerotic and tortuous as before. Degenerative changes of the spine. Bones are osteopenic. No effusion or pneumothorax. No significant interval change. IMPRESSION: Cardiomegaly with vascular congestion No superimposed acute process Aortic atherosclerosis and tortuosity Electronically Signed   By: Judie Petit.  Shick M.D.   On: 09/16/2015 17:14   Ct Head Wo Contrast  09/16/2015  CLINICAL DATA:  Frontal headache. Slurred speech and confusion started around 2:20 p.m. History of atrial fibrillation and COPD. EXAM: CT HEAD WITHOUT CONTRAST TECHNIQUE: Contiguous axial images were obtained from the base of the skull through the vertex without intravenous contrast. COMPARISON:  None. FINDINGS: There is mild central and cortical atrophy. Periventricular white matter changes are consistent with small vessel disease. There is encephalomalacia in the left occipital lobe consistent with old infarct. Focal ischemic changes identified in the right parietal lobe and left frontal lobe. An old lacunar infarct is identified within the right putamen. No associated  hemorrhage. No evidence for acute infarction. Bone windows are unremarkable. Note is made of atherosclerotic calcification of the internal carotid arteries. IMPRESSION:  1. Significant atrophy and small vessel disease. 2. 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 3.  No evidence for acute intracranial abnormality.    Globe Electronically Signed   By: Norva Pavlov M.D.   On: 09/16/2015 18:53   Mr Brain Wo Contrast  09/17/2015  CLINICAL DATA:  Acute encephalopathy. Numbness and progressively worsening confusion. Slurred speech. Generalized weakness. EXAM: MRI HEAD WITHOUT CONTRAST TECHNIQUE: Multiplanar, multiecho pulse sequences of the brain and surrounding structures were obtained without intravenous contrast. COMPARISON:  09/16/2015 head CT.  08/13/2008 carotid ultrasound. FINDINGS: There is no evidence of acute infarct, intracranial hemorrhage, mass, midline shift, or extra-axial fluid collection. Chronic left frontal and left parieto-occipital cortical infarcts and a chronic right basal ganglia infarct are again noted. There is mild global cerebral atrophy. Patchy T2 hyperintensities in the cerebral white matter bilaterally separate from the areas of chronic infarction are nonspecific but compatible with moderate chronic small vessel ischemic disease. Mild chronic small vessel ischemic changes noted in the brainstem. Orbits are unremarkable. Paranasal sinuses are clear. There is a trace left mastoid effusion. Abnormal appearance of the distal left internal carotid artery is consistent with previously demonstrated occlusion. Other major intracranial vascular flow voids are preserved. IMPRESSION: 1. No acute intracranial abnormality. 2. Moderate chronic small vessel ischemic disease in the cerebral white matter. 3. 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 4. Chronically occluded left internal carotid artery.  Electronically Signed   By: Sebastian Ache M.D.   On: 09/17/2015 10:35   Dg Chest Port 1 View  10/09/2015  CLINICAL DATA:  New onset left chest pain. No radiation. Shortness of breath. Symptoms since 0200 hours today. EXAM: PORTABLE CHEST 1 VIEW COMPARISON:  09/16/2015 FINDINGS: Shallow inspiration with atelectasis in the lung bases. Possible superimposed infiltration in the right lung base. Linear atelectasis in the midlung regions bilaterally. Heart size and pulmonary vascularity are increased consistent with pulmonary vascular congestion. Peribronchial thickening suggesting chronic bronchitis. Interstitial changes in the lung bases may indicate early edema. Calcified and tortuous aorta. No blunting of costophrenic angles. No pneumothorax. IMPRESSION: Two shallow inspiration with atelectasis in the lung bases and possible superimposed infiltration in the right lung base. Cardiac enlargement with pulmonary vascular congestion and mild interstitial edema. Electronically Signed   By: Burman Nieves M.D.   On: 10/09/2015 03:55         Subjective: Overall continues to breathe better but still has some dyspnea on exertion. Denies fevers, chills, chest pain, nausea, vomiting, diarrhea, abdominal pain, dysuria, hematuria, rashes, headache.  Objective: Filed Vitals:   10/12/15 0150 10/12/15 0611 10/12/15 0730 10/12/15 1321  BP:  159/84  128/77  Pulse:  72  72  Temp:  98.1 F (36.7 C)  98.4 F (36.9 C)  TempSrc:  Oral  Oral  Resp:  15  19  Height:      Weight: 70.4 kg (155 lb 3.3 oz)     SpO2:  90% 94% 94%    Intake/Output Summary (Last 24 hours) at 10/12/15 1325 Last data filed at 10/12/15 0929  Gross per 24 hour  Intake    270 ml  Output    650 ml  Net   -380 ml   Weight change: 0.4 kg (14.1 oz) Exam:   General:  Pt is alert, follows commands appropriately, not in acute distress  HEENT: No icterus, No thrush, No neck mass, [place]/AT  Cardiovascular: RRR, S1/S2, no rubs, no  gallops  Respiratory: Diminished breath sounds right base. Mild bibasilar wheeze. Scattered rales.  Abdomen: Soft/+BS, non tender, non distended, no guarding  Extremities: trace LE edema, No lymphangitis, No petechiae, No rashes, no synovitis  Data Reviewed: Basic Metabolic Panel:  Recent Labs Lab 10/09/15 0347 10/10/15 0514 10/11/15 0523  NA 138 135 135  K 3.8 4.6 4.7  CL 97* 96* 95*  CO2 29 30 30   GLUCOSE 112* 144* 140*  BUN 14 20 29*  CREATININE 1.41* 1.70* 1.61*  CALCIUM 8.7* 8.3* 8.7*   Liver Function Tests: No results for input(s): AST, ALT, ALKPHOS, BILITOT, PROT, ALBUMIN in the last 168 hours. No results for input(s): LIPASE, AMYLASE in the last 168 hours. No results for input(s): AMMONIA in the last 168 hours. CBC:  Recent Labs Lab 10/09/15 0347 10/10/15 0514 10/11/15 0523  WBC 8.5 12.5* 14.9*  NEUTROABS 7.1  --   --   HGB 13.0 14.1 13.8  HCT 40.9 44.0 42.6  MCV 95.6 95.0 93.8  PLT 227 243 253   Cardiac Enzymes:  Recent Labs Lab 10/09/15 0943 10/09/15 1233 10/09/15 1517  TROPONINI <0.03 <0.03 <0.03   BNP: Invalid input(s): POCBNP CBG:  Recent Labs Lab 10/11/15 1154 10/11/15 1706 10/11/15 2117 10/12/15 0740 10/12/15 1143  GLUCAP 195* 185* 201* 143* 289*    Recent Results (from the past 240 hour(s))  Urine culture     Status: None   Collection Time: 10/07/15 11:00 AM  Result Value Ref Range Status   Urine Culture, Routine Final report  Final   Urine Culture result 1 No growth  Final  Culture, blood (routine x 2)     Status: None (Preliminary result)   Collection Time: 10/09/15  4:50 AM  Result Value Ref Range Status   Specimen Description BLOOD HAND RIGHT  Final   Special Requests BOTTLES DRAWN AEROBIC AND ANAEROBIC 10CC  Final   Culture NO GROWTH 2 DAYS  Final   Report Status PENDING  Incomplete  Culture, blood (routine x 2)     Status: None (Preliminary result)   Collection Time: 10/09/15  4:50 AM  Result Value Ref Range Status    Specimen Description BLOOD ARM LEFT  Final   Special Requests BOTTLES DRAWN AEROBIC ONLY 10CC  Final   Culture NO GROWTH 2 DAYS  Final   Report Status PENDING  Incomplete  Urine culture     Status: None   Collection Time: 10/09/15  1:20 PM  Result Value Ref Range Status   Specimen Description URINE, CLEAN CATCH  Final   Special Requests NONE  Final   Culture NO GROWTH 1 DAY  Final   Report Status 10/10/2015 FINAL  Final  Culture, expectorated sputum-assessment     Status: None   Collection Time: 10/10/15 12:12 PM  Result Value Ref Range Status   Specimen Description EXPECTORATED SPUTUM  Final   Special Requests NONE  Final   Sputum evaluation   Final    MICROSCOPIC FINDINGS SUGGEST THAT THIS SPECIMEN IS NOT REPRESENTATIVE OF LOWER RESPIRATORY SECRETIONS. PLEASE RECOLLECT. Smith,V RN @ 1302 10/10/15 Leonard,A    Report Status 10/10/2015 FINAL  Final     Scheduled Meds: . atorvastatin  40 mg Oral q1800  . budesonide (PULMICORT) nebulizer solution  0.25 mg Nebulization BID  . busPIRone  7.5 mg Oral BID  . cefTAZidime (FORTAZ)  IV  2 g Intravenous Q24H  . citalopram  20 mg Oral Daily  . diltiazem  300 mg Oral Daily  .  docusate sodium  100 mg Oral BID  . feeding supplement (ENSURE ENLIVE)  237 mL Oral Q24H  . insulin aspart  0-15 Units Subcutaneous TID WC  . insulin aspart  0-5 Units Subcutaneous QHS  . ipratropium-albuterol  3 mL Nebulization Q6H  . linagliptin  5 mg Oral Daily  . methylPREDNISolone (SOLU-MEDROL) injection  60 mg Intravenous 3 times per day  . metoprolol  100 mg Oral BID  . pantoprazole  40 mg Oral Daily  . senna  2 tablet Oral Daily  . Warfarin - Pharmacist Dosing Inpatient   Does not apply q1800   Continuous Infusions:    Estrella Alcaraz, DO  [facility] Pager [phone]  If 7PM-7AM, please contact night-coverage [email or web address] Password [email or web address] 10/12/2015, 1:25 PM   LOS: 3 days

## 2015-10-12 NOTE — Discharge Summary (Signed)
Physician Discharge Summary  [name] ZOX:[id number] DOB: 07-Jan-1933 DOA: 10/09/2015  PCP: Bennie Pierini, [name]  Admit date: 10/09/2015 Discharge date: 10/14/15 Recommendations for Outpatient Follow-up:  1. Pt will need to follow up with PCP in 2 weeks post discharge 2. Please obtain BMP in one week 3. Please check INR on 10/16/2015 and adjust Coumadin dosing accordingly for INR 2-3 Discharge Diagnoses:  Acute on chronic respiratory failure with hypoxia -Secondary to HCAP and COPD exacerbation -Presently stable on 2 L nasal cannula -Weaned oxygen back to her home dose for saturation greater than 92% -Patient will go home on her baseline 2 L nasal cannula at home HCAP -Continue ceftazidime--patient received 6 days of antibiotics -Home with Augmentin x 1 more day -d/c vancomycin  -Follow blood cultures--neg to date COPD exacerbation -The patient continues to have diffuse wheezing but is not in distress -continue intravenous Solu-Medrol -On 10/13/2015, the patient was transitioned to oral prednisone -Respiratory status remained clinically stable on 2 L nasal cannula -continue duonebs -add budesonide nebulizer during the hospitalization  -overall slow improvement -Much of her wheezing is upper airway in origin--started Flonase and Claritin -Discharge home with prednisone taper -Restart Spiriva after discharge Atypical chest pain/CAD -EKG with nonspecific T-wave changes -No symptoms of angina -Continue metoprolol tartrate -finish cycling troponins--neg x 3 Chronic diastolic CHF -07/30/2015 echo shows EF 50-55%, grade 3 DD, severe LAE -clinically compensated Chronic atrial fibrillation -Rate is marginally controlled at this point -Secondary to her pneumonia and COPD exacerbation -Continue diltiazem and metoprolol -Continue warfarin 5 mg Tuesday and Fridays, 2.5 mg on all other days -Please check INR on 10/16/2015 and adjust Coumadin accordingly -CHADS-VASc =  7 -diltiazem CD increased to 360 mg daily Diabetes mellitus type 2 -Discontinue metformin -09/17/2015 hemoglobin A1c 6.4 -Continue Tradjenta for now, but a case could be made to discontinue all agents altogether given the patient's age and level of hyperglycemia -increase to moderate SSI due to steroids -will not send the patient home with any oral hypoglycemic agents as her CBGs will continue to improve with prednisone taper  Hypertension -Continue metoprolol tartrate, diltiazem -BP acceptable Hyperlipidemia -Continue statin Hyperkalemia  -The patient developed an episode of hyperkalemia during hospitalization  -Her potassium supplementation was discontinued  -This will not be continued after discharge, please check BMP within 1 week  Discharge Condition: stable  Disposition: home Follow-up Information    Follow up with [facility].   Why:  resume HH services.   Contact information:   [address] [place] [address] [phone]       Diet:carb modified Wt Readings from Last 3 Encounters:  10/14/15 77.5 kg (170 lb 13.7 oz)  10/07/15 73.029 kg (161 lb)  09/30/15 71.94 kg (158 lb 9.6 oz)    History of present illness:  81 year old female with a history of COPD, chronic respiratory failure, CAD s/p PCI x 2, hypertension, chronic atrial fibrillation, CKD stage III-4, diastolic CHF, tobacco abuse presented with 1-2 day history of left-sided chest pain or shortness of breath. The patient has been having a worsening cough with yellow sputum production. There is no hemoptysis. The patient has had subjective fevers, although she did take her temperature at home prior to admission. Her highest temperature was 100.84F. Unfortunately, the patient continues to smoke one half pack per day. She has at least a 60-pack-year history. The patient is on when necessary oxygen, but her home health nurse has been telling her that she needs to wear her oxygen 24 /7.  Apparently, the patient has been recently treated with ciprofloxacin and then amoxicillin for unresolved UTIs. She presently denies any dysuria or hematuria. the patient was started on intravenous antibiotics. She remained afebrile and hemodynamically stable. Cultures were negative. The patient was started on intravenous steroids. She improved slowly. Ultimately, she was transitioned to oral prednisone. For her upper airway wheezing and vocal cords function, the patient was started on Flonase and Claritin. The patient will be discharged home on her baseline 2 L nasal cannula.   Discharge Exam: Filed Vitals:   10/14/15 0532  BP: 151/68  Pulse: 94  Temp: 98.5 F (36.9 C)  Resp: 18   Filed Vitals:   10/13/15 2215 10/14/15 0306 10/14/15 0532 10/14/15 0737  BP: 155/90  151/68   Pulse: 68  94   Temp: 98.3 F (36.8 C)  98.5 F (36.9 C)   TempSrc: Oral  Oral   Resp: 20  18   Height:      Weight:  77.5 kg (170 lb 13.7 oz)    SpO2: 92%   91%   General: A&O x 3, NAD, pleasant, cooperative Cardiovascular: IRRR, no rub, no gallop, no S3 Respiratory: bibasilar rales. No wheezes Abdomen:soft, nontender, nondistended, positive bowel sounds Extremities: No edema, No lymphangitis, no petechiae  Discharge Instructions      Discharge Instructions    Diet - low sodium heart healthy    Complete by:  As directed      Increase activity slowly    Complete by:  As directed             Medication List    STOP taking these medications        potassium chloride SA 20 MEQ tablet  Commonly known as:  KLOR-CON M20      TAKE these medications        alendronate 70 MG tablet  Commonly known as:  FOSAMAX  Take 1 tablet (70 mg total) by mouth once a week. Take with a full glass of water on an empty stomach.     amoxicillin-clavulanate 875-125 MG tablet  Commonly known as:  AUGMENTIN  Take 1 tablet by mouth 2 (two) times daily.     atorvastatin 40 MG tablet  Commonly known as:  LIPITOR   TAKE 1 TABLET (40 MG TOTAL) BY MOUTH DAILY.     busPIRone 7.5 MG tablet  Commonly known as:  BUSPAR  Take 1 tablet (7.5 mg total) by mouth 2 (two) times daily. .     citalopram 20 MG tablet  Commonly known as:  CELEXA  Take 1 tablet (20 mg total) by mouth daily.     dexlansoprazole 60 MG capsule  Commonly known as:  DEXILANT  TAKE 1 CAPSULE (60 MG TOTAL) BY MOUTH DAILY.     diltiazem 360 MG 24 hr capsule  Commonly known as:  CARDIZEM CD  Take 1 capsule (360 mg total) by mouth daily.     feeding supplement (ENSURE ENLIVE) Liqd  Take 237 mLs by mouth daily.     fluticasone 50 MCG/ACT nasal spray  Commonly known as:  FLONASE  Place 2 sprays into both nostrils daily.     Fluticasone-Salmeterol 250-50 MCG/DOSE Aepb  Commonly known as:  ADVAIR DISKUS  Inhale 1 puff into the lungs 2 (two) times daily.     HYDROcodone-acetaminophen 5-325 MG tablet  Commonly known as:  NORCO/VICODIN  Take 1 tablet by mouth 2 (two) times daily as needed for moderate pain.  loratadine 10 MG tablet  Commonly known as:  CLARITIN  Take 1 tablet (10 mg total) by mouth daily.     metoprolol 100 MG tablet  Commonly known as:  LOPRESSOR  Take 1 tablet (100 mg total) by mouth 2 (two) times daily.     nitroGLYCERIN 0.4 MG SL tablet  Commonly known as:  NITROSTAT  Place 1 tablet (0.4 mg total) under the tongue every 5 (five) minutes as needed for chest pain.     predniSONE 20 MG tablet  Commonly known as:  DELTASONE  Take 3 tablets (60 mg total) by mouth 2 (two) times daily with a meal. X 2 days.  Then 2 tablets (40 mg) two times dailyl x 2 days;  Then 1 tab (20 mg) twice a day x 2 days     tiotropium 18 MCG inhalation capsule  Commonly known as:  SPIRIVA HANDIHALER  PLACE 1 CAPSULE (18 MCG TOTAL) INTO INHALER AND INHALE DAILY.     torsemide 20 MG tablet  Commonly known as:  DEMADEX  Take 1 tablet (20 mg total) by mouth once. Take one tablet by mouth if weight is 160 pounds or above      warfarin 5 MG tablet  Commonly known as:  COUMADIN  Take 0.5-1 tablets (2.5-5 mg total) by mouth See admin instructions. Takes 1 tablet ( ) every Tuesdays and Fridays and 1/2 tablet (2.5 mg) all other days.         The results of significant diagnostics from this hospitalization (including imaging, microbiology, ancillary and laboratory) are listed below for reference.    Significant Diagnostic Studies: Dg Chest 2 View  09/16/2015  CLINICAL DATA:  Confusion, altered mental status EXAM: CHEST  2 VIEW COMPARISON:  08/14/2015 FINDINGS: Marked cardiomegaly with mild vascular congestion. No current edema or effusion. Lungs remain clear. No focal pneumonia, collapse or consolidation. Aorta is atherosclerotic and tortuous as before. Degenerative changes of the spine. Bones are osteopenic. No effusion or pneumothorax. No significant interval change. IMPRESSION: Cardiomegaly with vascular congestion No superimposed acute process Aortic atherosclerosis and tortuosity Electronically Signed   By: Judie Petit.  Shick M.D.   On: 09/16/2015 17:14   Ct Head Wo Contrast  09/16/2015  CLINICAL DATA:  Frontal headache. Slurred speech and confusion started around 2:20 p.m. History of atrial fibrillation and COPD. EXAM: CT HEAD WITHOUT CONTRAST TECHNIQUE: Contiguous axial images were obtained from the base of the skull through the vertex without intravenous contrast. COMPARISON:  None. FINDINGS: There is mild central and cortical atrophy. Periventricular white matter changes are consistent with small vessel disease. There is encephalomalacia in the left occipital lobe consistent with old infarct. Focal ischemic changes identified in the right parietal lobe and left frontal lobe. An old lacunar infarct is identified within the right putamen. No associated hemorrhage. No evidence for acute infarction. Bone windows are unremarkable. Note is made of atherosclerotic calcification of the internal carotid arteries. IMPRESSION: 1.  Significant atrophy and small vessel disease. 2. 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 3.  No evidence for acute intracranial abnormality.    Globe Electronically Signed   By: Norva Pavlov M.D.   On: 09/16/2015 18:53   Mr Brain Wo Contrast  09/17/2015  CLINICAL DATA:  Acute encephalopathy. Numbness and progressively worsening confusion. Slurred speech. Generalized weakness. EXAM: MRI HEAD WITHOUT CONTRAST TECHNIQUE: Multiplanar, multiecho pulse sequences of the brain and surrounding structures were obtained without intravenous contrast. COMPARISON:  09/16/2015 head CT.  08/13/2008 carotid ultrasound. FINDINGS: There is no evidence of acute infarct, intracranial hemorrhage, mass, midline shift, or extra-axial fluid collection. Chronic left frontal and left parieto-occipital cortical infarcts and a chronic right basal ganglia infarct are again noted. There is mild global cerebral atrophy. Patchy T2 hyperintensities in the cerebral white matter bilaterally separate from the areas of chronic infarction are nonspecific but compatible with moderate chronic small vessel ischemic disease. Mild chronic small vessel ischemic changes noted in the brainstem. Orbits are unremarkable. Paranasal sinuses are clear. There is a trace left mastoid effusion. Abnormal appearance of the distal left internal carotid artery is consistent with previously demonstrated occlusion. Other major intracranial vascular flow voids are preserved. IMPRESSION: 1. No acute intracranial abnormality. 2. Moderate chronic small vessel ischemic disease in the cerebral white matter. 3. 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 4. Chronically occluded left internal carotid artery. Electronically Signed   By: Sebastian Ache M.D.   On: 09/17/2015 10:35   Dg Chest Port 1 View  10/09/2015  CLINICAL DATA:  New onset left chest pain. No radiation. Shortness  of breath. Symptoms since 0200 hours today. EXAM: PORTABLE CHEST 1 VIEW COMPARISON:  09/16/2015 FINDINGS: Shallow inspiration with atelectasis in the lung bases. Possible superimposed infiltration in the right lung base. Linear atelectasis in the midlung regions bilaterally. Heart size and pulmonary vascularity are increased consistent with pulmonary vascular congestion. Peribronchial thickening suggesting chronic bronchitis. Interstitial changes in the lung bases may indicate early edema. Calcified and tortuous aorta. No blunting of costophrenic angles. No pneumothorax. IMPRESSION: Two shallow inspiration with atelectasis in the lung bases and possible superimposed infiltration in the right lung base. Cardiac enlargement with pulmonary vascular congestion and mild interstitial edema. Electronically Signed   By: Burman Nieves M.D.   On: 10/09/2015 03:55     Microbiology: Recent Results (from the past 240 hour(s))  Urine culture     Status: None   Collection Time: 10/07/15 11:00 AM  Result Value Ref Range Status   Urine Culture, Routine Final report  Final   Urine Culture result 1 No growth  Final  Culture, blood (routine x 2)     Status: None (Preliminary result)   Collection Time: 10/09/15  4:50 AM  Result Value Ref Range Status   Specimen Description BLOOD HAND RIGHT  Final   Special Requests BOTTLES DRAWN AEROBIC AND ANAEROBIC 10CC  Final   Culture NO GROWTH 4 DAYS  Final   Report Status PENDING  Incomplete  Culture, blood (routine x 2)     Status: None (Preliminary result)   Collection Time: 10/09/15  4:50 AM  Result Value Ref Range Status   Specimen Description BLOOD ARM LEFT  Final   Special Requests BOTTLES DRAWN AEROBIC ONLY 10CC  Final   Culture NO GROWTH 4 DAYS  Final   Report Status PENDING  Incomplete  Urine culture     Status: None   Collection Time: 10/09/15  1:20 PM  Result Value Ref Range Status   Specimen Description URINE, CLEAN CATCH  Final   Special Requests NONE   Final   Culture NO GROWTH 1 DAY  Final   Report Status 10/10/2015 FINAL  Final  Culture, expectorated sputum-assessment     Status: None   Collection Time: 10/10/15 12:12 PM  Result Value Ref Range Status   Specimen Description EXPECTORATED SPUTUM  Final   Special Requests NONE  Final   Sputum evaluation   Final    MICROSCOPIC FINDINGS  SUGGEST THAT THIS SPECIMEN IS NOT REPRESENTATIVE OF LOWER RESPIRATORY SECRETIONS. PLEASE RECOLLECT. Smith,V RN @ 1302 10/10/15 Leonard,A    Report Status 10/10/2015 FINAL  Final     Labs: Basic Metabolic Panel:  Recent Labs Lab 10/09/15 0347 10/10/15 0514 10/11/15 0523 10/13/15 0442 10/14/15 0429  NA 138 135 135 132* 137  K 3.8 4.6 4.7 5.6* 3.6  CL 97* 96* 95* 94* 97*  CO2 29 30 30 29 31   GLUCOSE 112* 144* 140* 141* 150*  BUN 14 20 29* 43* 33*  CREATININE 1.41* 1.70* 1.61* 1.83* 1.47*  CALCIUM 8.7* 8.3* 8.7* 8.9 8.2*   Liver Function Tests: No results for input(s): AST, ALT, ALKPHOS, BILITOT, PROT, ALBUMIN in the last 168 hours. No results for input(s): LIPASE, AMYLASE in the last 168 hours. No results for input(s): AMMONIA in the last 168 hours. CBC:  Recent Labs Lab 10/09/15 0347 10/10/15 0514 10/11/15 0523 10/14/15 0429  WBC 8.5 12.5* 14.9* 11.6*  NEUTROABS 7.1  --   --   --   HGB 13.0 14.1 13.8 14.6  HCT 40.9 44.0 42.6 43.6  MCV 95.6 95.0 93.8 92.6  PLT 227 243 253 262   Cardiac Enzymes:  Recent Labs Lab 10/09/15 0943 10/09/15 1233 10/09/15 1517  TROPONINI <0.03 <0.03 <0.03   BNP: Invalid input(s): POCBNP CBG:  Recent Labs Lab 10/13/15 0759 10/13/15 1201 10/13/15 1658 10/13/15 2214 10/14/15 0741  GLUCAP 137* 130* 165* 141* 138*    Time coordinating discharge:  Greater than 30 minutes  Signed:  Kynzie Polgar, DO [facility] Pager: [phone] 10/14/2015, 8:35 AM

## 2015-10-12 NOTE — Progress Notes (Signed)
ANTICOAGULATION CONSULT NOTE - Follow Up Consult  Pharmacy Consult for warfarin/Fortaz Indication: atrial fibrillation and HCAP  No Known Allergies  Patient Measurements: Height: 5' (152.4 cm) Weight: 155 lb 3.3 oz (70.4 kg) IBW/kg (Calculated) : 45.5  Vital Signs: Temp: 98.1 F (36.7 C) (10/16 0611) Temp Source: Oral (10/16 0611) BP: 159/84 mmHg (10/16 0611) Pulse Rate: 72 (10/16 0611)  Labs:  Recent Labs  10/09/15 1233 10/09/15 1517 10/10/15 0514 10/11/15 0523 10/12/15 0555  HGB  --   --  14.1 13.8  --   HCT  --   --  44.0 42.6  --   PLT  --   --  243 253  --   LABPROT  --   --  31.9* 30.0* 29.7*  INR  --   --  3.17* 2.92* 2.89*  CREATININE  --   --  1.70* 1.61*  --   TROPONINI <0.03 <0.03  --   --   --     Estimated Creatinine Clearance: 24.4 mL/min (by C-G formula based on Cr of 1.61).   Assessment: 82yoF admitted 10/09/2015 on Coumadin PTA for Afib. CHA2DS2VASc 7. INR bumped significantly after 2.5mg  dose on 10/13 from 2.6>>3.17. Held 10/14 and 10/15 dose.  INR still elevated 2.89 (therapeutic), H/H stable, Plts wnl. No s/sx bleeding noted.  Day#4 ceftazidime for HCAP. Patient remains afebrile, WBC 14.9(on solu-medrol), PCT <0.1, SCr 1.61, CrCl ~1225ml/min  Goal of Therapy:  INR 2-3 Monitor platelets by anticoagulation protocol: Yes  Eradication of infection   Plan:  - Continue Fortaz 2g IV q24h - Will hold warfarin tonight - Monitor daily INR, CBC, s/sx of bleeding - Monitor vitals, duration of therapy and clinical progression   Casilda Carlsaylor Ely Ballen, PharmD. Clinical Pharmacist Resident Pager: [phone]  10/12/2015,10:37 AM

## 2015-10-13 DIAGNOSIS — I509 Heart failure, unspecified: Secondary | ICD-10-CM

## 2015-10-13 DIAGNOSIS — E875 Hyperkalemia: Secondary | ICD-10-CM

## 2015-10-13 DIAGNOSIS — J9620 Acute and chronic respiratory failure, unspecified whether with hypoxia or hypercapnia: Secondary | ICD-10-CM

## 2015-10-13 DIAGNOSIS — R0902 Hypoxemia: Secondary | ICD-10-CM

## 2015-10-13 LAB — BASIC METABOLIC PANEL
Anion gap: 9 (ref 5–15)
BUN: 43 mg/dL — ABNORMAL HIGH (ref 6–20)
CO2: 29 mmol/L (ref 22–32)
Calcium: 8.9 mg/dL (ref 8.9–10.3)
Chloride: 94 mmol/L — ABNORMAL LOW (ref 101–111)
Creatinine, Ser: 1.83 mg/dL — ABNORMAL HIGH (ref 0.44–1.00)
GFR calc Af Amer: 29 mL/min — ABNORMAL LOW (ref >60)
GFR calc non Af Amer: 25 mL/min — ABNORMAL LOW (ref >60)
Glucose, Bld: 141 mg/dL — ABNORMAL HIGH (ref 65–99)
Potassium: 5.6 mmol/L — ABNORMAL HIGH (ref 3.5–5.1)
Sodium: 132 mmol/L — ABNORMAL LOW (ref 135–145)

## 2015-10-13 LAB — GLUCOSE, CAPILLARY
Glucose-Capillary: 137 mg/dL — ABNORMAL HIGH (ref 65–99)
Glucose-Capillary: 130 mg/dL — ABNORMAL HIGH (ref 65–99)
Glucose-Capillary: 165 mg/dL — ABNORMAL HIGH (ref 65–99)
Glucose-Capillary: 141 mg/dL — ABNORMAL HIGH (ref 65–99)

## 2015-10-13 LAB — PROTIME-INR
INR: 2.41 — ABNORMAL HIGH (ref 0.00–1.49)
Prothrombin Time: 25.9 seconds — ABNORMAL HIGH (ref 11.6–15.2)

## 2015-10-13 LAB — PROCALCITONIN: Procalcitonin: <0.1 ng/mL

## 2015-10-13 MED ORDER — DILTIAZEM HCL ER COATED BEADS 180 MG PO CP24
360.0000 mg | ORAL_CAPSULE | Freq: Every day | ORAL | Status: DC
  Administered 2015-10-14: 360 mg via ORAL
  Filled 2015-10-13: qty 2

## 2015-10-13 MED ORDER — SODIUM POLYSTYRENE SULFONATE 15 GM/60ML PO SUSP
30.0000 g | Freq: Once | ORAL | Status: AC
Start: 2015-10-13 — End: 2015-10-13
  Administered 2015-10-13: 30 g via ORAL
  Filled 2015-10-13: qty 120

## 2015-10-13 MED ORDER — SODIUM CHLORIDE 0.9 % IV SOLN
INTRAVENOUS | Status: AC
  Administered 2015-10-13: 10:00:00 via INTRAVENOUS

## 2015-10-13 MED ORDER — WARFARIN SODIUM 2.5 MG PO TABS
2.5000 mg | ORAL_TABLET | Freq: Once | ORAL | Status: AC
  Administered 2015-10-13: 2.5 mg via ORAL
  Filled 2015-10-13: qty 1

## 2015-10-13 NOTE — Care Management Important Message (Signed)
Important Message  Patient Details  Name: Alfredia FergusonDessie A Whitaker MRN: [id number] Date of Birth: [birth year]   Medicare Important Message Given:  Yes-second notification given    Kyla BalzarineShealy, Eron Goble Abena 10/13/2015, 12:06 PM

## 2015-10-13 NOTE — Progress Notes (Signed)
ANTICOAGULATION and ANTIBIOTIC CONSULT NOTE - Follow Up Consult  Pharmacy Consult for Coumadin and Ceftazidime Indication: atrial fibrillation and HCAP  No Known Allergies  Patient Measurements: Height: 5' (152.4 cm) Weight: 169 lb 5 oz (76.8 kg) IBW/kg (Calculated) : 45.5  Labs:  Recent Labs  10/11/15 0523 10/12/15 0555 10/13/15 0442  HGB 13.8  --   --   HCT 42.6  --   --   PLT 253  --   --   LABPROT 30.0* 29.7* 25.9*  INR 2.92* 2.89* 2.41*  CREATININE 1.61*  --  1.83*    Estimated Creatinine Clearance: 22.4 mL/min (by C-G formula based on Cr of 1.83).  Assessment:   INR remains therapeutic (2.41). Has not had any Coumadin in 3 days, after INR 2.60>3.17 after Coumadin 2.5 mg x 1 on 10/13.  Expect INR will trend down again by am.    Home Coumadin regimen:  2.5 mg daily except 5 mg on Tuesday and Friday.  Last outpatient INR 3.1 on 10/06/15, but INRs had been variable on this regimen for several months.    Day # 5 Ceftazidime for HCAP.  BUN/creatinine trended up today but dose remains appropriate.  Afebrile, WBC 10.9 (10/15);  On Prednisone 60 mg BID.  Goal of Therapy:  INR 2-3 Monitor platelets by anticoagulation protocol: Yes  Appropriate Ceftazidime dose for renal function and infection   Plan:   Coumadin 2.5 mg x 1 today.  Usual Monday dose.  Continue daily PT/INR.  q72hr CBC in am.  Continue Ceftazidime 2 gm IV q24hrs.  Follow renal function, progress.  Dennie FettersEgan, Klayton Monie Donovan, RPh Pager: [phone] 10/13/2015,12:16 PM

## 2015-10-13 NOTE — Progress Notes (Signed)
PROGRESS NOTE  [name] ZOX:[id number] DOB: [birth year] DOA: 10/09/2015 PCP: Bennie Pierini, [name]  Brief history 81 year old female with a history of COPD, chronic respiratory failure, CAD s/p PCI x 2, hypertension, chronic atrial fibrillation, CKD stage III-4, diastolic CHF, tobacco abuse presented with 1-2 day history of left-sided chest pain or shortness of breath. The patient has been having a worsening cough with yellow sputum production. There is no hemoptysis. The patient has had subjective fevers, although she did take her temperature at home prior to admission. Her highest temperature was 100.69F. Unfortunately, the patient continues to smoke one half pack per day. She has at least a 60-pack-year history. The patient is on when necessary oxygen, but her home health nurse has been telling her that she needs to wear her oxygen 24 /7. Apparently, the patient has been recently treated with ciprofloxacin and then amoxicillin for unresolved UTIs. During admission, the patient denied dysuria or hematuria. The patient continued to have wheezing, but much of her wheezing was likely vocal cord dysfunction resulting in upper airway wheezing. Clinically, the patient improved, but had little reserve. Assessment/Plan: Acute on chronic respiratory failure with hypoxia -Secondary to HCAP and COPD exacerbation -Presently stable on 2 L nasal cannula -Weaned oxygen back to her home dose for saturation greater than 92% HCAP -Continue ceftazidime D#5 -d/c vancomycin -Follow blood cultures--neg to date COPD exacerbation -The patient continues to have diffuse wheezing but is not in distress -continue intravenous Solu-Medrol -On 10/13/2015, the patient was transitioned to oral prednisone -Respiratory status remained clinically stable on 2 L nasal cannula -continue duonebs -Continue budesonide nebulizer -overall slow improvement -Much of her wheezing is upper airway in  origin Hyperkalemia -kayexalate x 1 -give one liter IV normal saline -recheck BMP Atypical chest pain/CAD -EKG with nonspecific T-wave changes -No symptoms of angina -Continue metoprolol tartrate -finish cycling troponins--neg x 3 Chronic diastolic CHF -07/30/2015 echo shows EF 50-55%, grade 3 DD, severe LAE -clinically compensated Chronic atrial fibrillation -Rate is marginally controlled at this point -Secondary to her pneumonia and COPD exacerbation -Continue diltiazem and metoprolol -Continue warfarin 5 mg Tuesday and Fridays, 2.5 mg on all other days -Please check INR on 10/15/2015 and adjust Coumadin accordingly -CHADS-VASc = 7 Diabetes mellitus type 2 -Discontinue metformin -09/17/2015 hemoglobin A1c 6.4 -Continue Tradjenta for now, but a case could be made to discontinue all agents altogether given the patient's age and level of hyperglycemia -increase to moderate SSI due to steroids Hypertension -Continue metoprolol tartrate, diltiazem Hyperlipidemia -Continue statin   Family Communication:   Pt at beside Disposition Plan:   Home 10/14/15 if stable      Procedures/Studies: Dg Chest 2 View  09/16/2015  CLINICAL DATA:  Confusion, altered mental status EXAM: CHEST  2 VIEW COMPARISON:  08/14/2015 FINDINGS: Marked cardiomegaly with mild vascular congestion. No current edema or effusion. Lungs remain clear. No focal pneumonia, collapse or consolidation. Aorta is atherosclerotic and tortuous as before. Degenerative changes of the spine. Bones are osteopenic. No effusion or pneumothorax. No significant interval change. IMPRESSION: Cardiomegaly with vascular congestion No superimposed acute process Aortic atherosclerosis and tortuosity Electronically Signed   By: Judie Petit.  Shick M.D.   On: 09/16/2015 17:14   Ct Head Wo Contrast  09/16/2015  CLINICAL DATA:  Frontal headache. Slurred speech and confusion started around 2:20 p.m. History of atrial fibrillation and COPD. EXAM: CT  HEAD WITHOUT CONTRAST TECHNIQUE: Contiguous axial images were obtained from the base of the skull  through the vertex without intravenous contrast. COMPARISON:  None. FINDINGS: There is mild central and cortical atrophy. Periventricular white matter changes are consistent with small vessel disease. There is encephalomalacia in the left occipital lobe consistent with old infarct. Focal ischemic changes identified in the right parietal lobe and left frontal lobe. An old lacunar infarct is identified within the right putamen. No associated hemorrhage. No evidence for acute infarction. Bone windows are unremarkable. Note is made of atherosclerotic calcification of the internal carotid arteries. IMPRESSION: 1. Significant atrophy and small vessel disease. 2. 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 3.  No evidence for acute intracranial abnormality.    Globe Electronically Signed   By: Norva Pavlov M.D.   On: 09/16/2015 18:53   Mr Brain Wo Contrast  09/17/2015  CLINICAL DATA:  Acute encephalopathy. Numbness and progressively worsening confusion. Slurred speech. Generalized weakness. EXAM: MRI HEAD WITHOUT CONTRAST TECHNIQUE: Multiplanar, multiecho pulse sequences of the brain and surrounding structures were obtained without intravenous contrast. COMPARISON:  09/16/2015 head CT.  08/13/2008 carotid ultrasound. FINDINGS: There is no evidence of acute infarct, intracranial hemorrhage, mass, midline shift, or extra-axial fluid collection. Chronic left frontal and left parieto-occipital cortical infarcts and a chronic right basal ganglia infarct are again noted. There is mild global cerebral atrophy. Patchy T2 hyperintensities in the cerebral white matter bilaterally separate from the areas of chronic infarction are nonspecific but compatible with moderate chronic small vessel ischemic disease. Mild chronic small vessel ischemic changes  noted in the brainstem. Orbits are unremarkable. Paranasal sinuses are clear. There is a trace left mastoid effusion. Abnormal appearance of the distal left internal carotid artery is consistent with previously demonstrated occlusion. Other major intracranial vascular flow voids are preserved. IMPRESSION: 1. No acute intracranial abnormality. 2. Moderate chronic small vessel ischemic disease in the cerebral white matter. 3. 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 4. Chronically occluded left internal carotid artery. Electronically Signed   By: Sebastian Ache M.D.   On: 09/17/2015 10:35   Dg Chest Port 1 View  10/09/2015  CLINICAL DATA:  New onset left chest pain. No radiation. Shortness of breath. Symptoms since 0200 hours today. EXAM: PORTABLE CHEST 1 VIEW COMPARISON:  09/16/2015 FINDINGS: Shallow inspiration with atelectasis in the lung bases. Possible superimposed infiltration in the right lung base. Linear atelectasis in the midlung regions bilaterally. Heart size and pulmonary vascularity are increased consistent with pulmonary vascular congestion. Peribronchial thickening suggesting chronic bronchitis. Interstitial changes in the lung bases may indicate early edema. Calcified and tortuous aorta. No blunting of costophrenic angles. No pneumothorax. IMPRESSION: Two shallow inspiration with atelectasis in the lung bases and possible superimposed infiltration in the right lung base. Cardiac enlargement with pulmonary vascular congestion and mild interstitial edema. Electronically Signed   By: Burman Nieves M.D.   On: 10/09/2015 03:55        Subjective: Overall, patient's breathing is better, but she is still having shortness of breath with exertion. Denies any fevers, chills, chest discomfort, nausea, vomiting, diarrhea, abdominal pain. No hemoptysis.  Objective: Filed Vitals:   10/13/15 0607 10/13/15 0608 10/13/15 0615 10/13/15 0748  BP: 172/98     Pulse: 102 92  76  Temp:       TempSrc:      Resp: 20   20  Height:      Weight:   76.8 kg (169 lb 5 oz)   SpO2: 91% 90%  90%  Intake/Output Summary (Last 24 hours) at 10/13/15 0842 Last data filed at 10/12/15 2317  Gross per 24 hour  Intake    440 ml  Output   1300 ml  Net   -860 ml   Weight change: 6.4 kg (14 lb 1.8 oz) Exam:   General:  Pt is alert, follows commands appropriately, not in acute distress  HEENT: No icterus, No thrush, No neck mass, [place]/AT  Cardiovascular: IRRR, S1/S2, no rubs, no gallops  Respiratory: Bibasilar rales, right greater than left. Bibasilar wheeze.  Abdomen: Soft/+BS, non tender, non distended, no guarding; no hepatosplenomegaly  Extremities: trace LE edema, No lymphangitis, No petechiae, No rashes, no synovitis; no cyanosis or clubbing  Data Reviewed: Basic Metabolic Panel:  Recent Labs Lab 10/09/15 0347 10/10/15 0514 10/11/15 0523 10/13/15 0442  NA 138 135 135 132*  K 3.8 4.6 4.7 5.6*  CL 97* 96* 95* 94*  CO2 29 30 30 29   GLUCOSE 112* 144* 140* 141*  BUN 14 20 29* 43*  CREATININE 1.41* 1.70* 1.61* 1.83*  CALCIUM 8.7* 8.3* 8.7* 8.9   Liver Function Tests: No results for input(s): AST, ALT, ALKPHOS, BILITOT, PROT, ALBUMIN in the last 168 hours. No results for input(s): LIPASE, AMYLASE in the last 168 hours. No results for input(s): AMMONIA in the last 168 hours. CBC:  Recent Labs Lab 10/09/15 0347 10/10/15 0514 10/11/15 0523  WBC 8.5 12.5* 14.9*  NEUTROABS 7.1  --   --   HGB 13.0 14.1 13.8  HCT 40.9 44.0 42.6  MCV 95.6 95.0 93.8  PLT 227 243 253   Cardiac Enzymes:  Recent Labs Lab 10/09/15 0943 10/09/15 1233 10/09/15 1517  TROPONINI <0.03 <0.03 <0.03   BNP: Invalid input(s): POCBNP CBG:  Recent Labs Lab 10/12/15 1143 10/12/15 1637 10/12/15 1805 10/12/15 2155 10/13/15 0759  GLUCAP 289* 70 85 139* 137*    Recent Results (from the past 240 hour(s))  Urine culture     Status: None   Collection Time: 10/07/15 11:00 AM  Result  Value Ref Range Status   Urine Culture, Routine Final report  Final   Urine Culture result 1 No growth  Final  Culture, blood (routine x 2)     Status: None (Preliminary result)   Collection Time: 10/09/15  4:50 AM  Result Value Ref Range Status   Specimen Description BLOOD HAND RIGHT  Final   Special Requests BOTTLES DRAWN AEROBIC AND ANAEROBIC 10CC  Final   Culture NO GROWTH 3 DAYS  Final   Report Status PENDING  Incomplete  Culture, blood (routine x 2)     Status: None (Preliminary result)   Collection Time: 10/09/15  4:50 AM  Result Value Ref Range Status   Specimen Description BLOOD ARM LEFT  Final   Special Requests BOTTLES DRAWN AEROBIC ONLY 10CC  Final   Culture NO GROWTH 3 DAYS  Final   Report Status PENDING  Incomplete  Urine culture     Status: None   Collection Time: 10/09/15  1:20 PM  Result Value Ref Range Status   Specimen Description URINE, CLEAN CATCH  Final   Special Requests NONE  Final   Culture NO GROWTH 1 DAY  Final   Report Status 10/10/2015 FINAL  Final  Culture, expectorated sputum-assessment     Status: None   Collection Time: 10/10/15 12:12 PM  Result Value Ref Range Status   Specimen Description EXPECTORATED SPUTUM  Final   Special Requests NONE  Final   Sputum evaluation   Final  MICROSCOPIC FINDINGS SUGGEST THAT THIS SPECIMEN IS NOT REPRESENTATIVE OF LOWER RESPIRATORY SECRETIONS. PLEASE RECOLLECT. Smith,V RN @ 1302 10/10/15 Leonard,A    Report Status 10/10/2015 FINAL  Final     Scheduled Meds: . atorvastatin  40 mg Oral q1800  . budesonide (PULMICORT) nebulizer solution  0.25 mg Nebulization BID  . busPIRone  7.5 mg Oral BID  . cefTAZidime (FORTAZ)  IV  2 g Intravenous Q24H  . citalopram  20 mg Oral Daily  . diltiazem  300 mg Oral Daily  . docusate sodium  100 mg Oral BID  . feeding supplement (ENSURE ENLIVE)  237 mL Oral Q24H  . insulin aspart  0-15 Units Subcutaneous TID WC  . insulin aspart  0-5 Units Subcutaneous QHS  .  ipratropium-albuterol  3 mL Nebulization TID  . linagliptin  5 mg Oral Daily  . metoprolol  100 mg Oral BID  . pantoprazole  40 mg Oral Daily  . predniSONE  60 mg Oral BID WC  . senna  2 tablet Oral Daily  . sodium polystyrene  30 g Oral Once  . Warfarin - Pharmacist Dosing Inpatient   Does not apply q1800   Continuous Infusions: . sodium chloride       Roen Macgowan, DO  [facility] Pager [phone]  If 7PM-7AM, please contact night-coverage [email or web address] Password [email or web address] 10/13/2015, 8:42 AM   LOS: 4 days

## 2015-10-14 ENCOUNTER — Ambulatory Visit: Payer: Medicare Other | Admitting: Nurse Practitioner

## 2015-10-14 LAB — CBC
HCT: 43.6 % (ref 36.0–46.0)
Hemoglobin: 14.6 g/dL (ref 12.0–15.0)
MCH: 31 pg (ref 26.0–34.0)
MCHC: 33.5 g/dL (ref 30.0–36.0)
MCV: 92.6 fL (ref 78.0–100.0)
Platelets: 262 10*3/uL (ref 150–400)
RBC: 4.71 MIL/uL (ref 3.87–5.11)
RDW: 13.2 % (ref 11.5–15.5)
WBC: 11.6 10*3/uL — ABNORMAL HIGH (ref 4.0–10.5)

## 2015-10-14 LAB — CULTURE, BLOOD (ROUTINE X 2)
Culture: NO GROWTH
Culture: NO GROWTH

## 2015-10-14 LAB — BASIC METABOLIC PANEL
Anion gap: 9 (ref 5–15)
BUN: 33 mg/dL — ABNORMAL HIGH (ref 6–20)
CO2: 31 mmol/L (ref 22–32)
Calcium: 8.2 mg/dL — ABNORMAL LOW (ref 8.9–10.3)
Chloride: 97 mmol/L — ABNORMAL LOW (ref 101–111)
Creatinine, Ser: 1.47 mg/dL — ABNORMAL HIGH (ref 0.44–1.00)
GFR calc Af Amer: 38 mL/min — ABNORMAL LOW (ref >60)
GFR calc non Af Amer: 32 mL/min — ABNORMAL LOW (ref >60)
Glucose, Bld: 150 mg/dL — ABNORMAL HIGH (ref 65–99)
Potassium: 3.6 mmol/L (ref 3.5–5.1)
Sodium: 137 mmol/L (ref 135–145)

## 2015-10-14 LAB — PROTIME-INR
INR: 2.06 — ABNORMAL HIGH (ref 0.00–1.49)
Prothrombin Time: 23.1 seconds — ABNORMAL HIGH (ref 11.6–15.2)

## 2015-10-14 LAB — GLUCOSE, CAPILLARY
Glucose-Capillary: 138 mg/dL — ABNORMAL HIGH (ref 65–99)
Glucose-Capillary: 129 mg/dL — ABNORMAL HIGH (ref 65–99)

## 2015-10-14 MED ORDER — DILTIAZEM HCL ER COATED BEADS 360 MG PO CP24: 360.0000 mg | ORAL_CAPSULE | Freq: Every day | ORAL | Status: AC

## 2015-10-14 MED ORDER — FLUTICASONE PROPIONATE 50 MCG/ACT NA SUSP
2.0000 | Freq: Every day | NASAL | Status: DC
  Filled 2015-10-14: qty 16

## 2015-10-14 MED ORDER — PREDNISONE 20 MG PO TABS: 60.0000 mg | ORAL_TABLET | Freq: Two times a day (BID) | ORAL | Status: DC

## 2015-10-14 MED ORDER — FLUTICASONE PROPIONATE 50 MCG/ACT NA SUSP: 2.0000 | Freq: Every day | NASAL | Status: AC

## 2015-10-14 MED ORDER — LORATADINE 10 MG PO TABS: 10.0000 mg | ORAL_TABLET | Freq: Every day | ORAL | Status: DC

## 2015-10-14 MED ORDER — AMOXICILLIN-POT CLAVULANATE 875-125 MG PO TABS: 1.0000 | ORAL_TABLET | Freq: Two times a day (BID) | ORAL | Status: DC

## 2015-10-14 MED ORDER — CITALOPRAM HYDROBROMIDE 20 MG PO TABS: 20.0000 mg | ORAL_TABLET | Freq: Every day | ORAL | Status: AC

## 2015-10-14 NOTE — Progress Notes (Signed)
Nsg Discharge Note  Admit Date:  10/09/2015 Discharge date: 10/14/2015   [name] to be D/C'd Home per MD order.  AVS completed.  Copy for chart, and copy for patient signed, and dated. Patient/caregiver able to verbalize understanding.  Discharge Medication:   Medication List    STOP taking these medications        potassium chloride SA 20 MEQ tablet  Commonly known as:  KLOR-CON M20      TAKE these medications        alendronate 70 MG tablet  Commonly known as:  FOSAMAX  Take 1 tablet (70 mg total) by mouth once a week. Take with a full glass of water on an empty stomach.     amoxicillin-clavulanate 875-125 MG tablet  Commonly known as:  AUGMENTIN  Take 1 tablet by mouth 2 (two) times daily.     atorvastatin 40 MG tablet  Commonly known as:  LIPITOR  TAKE 1 TABLET (40 MG TOTAL) BY MOUTH DAILY.     busPIRone 7.5 MG tablet  Commonly known as:  BUSPAR  Take 1 tablet (7.5 mg total) by mouth 2 (two) times daily. .     citalopram 20 MG tablet  Commonly known as:  CELEXA  Take 1 tablet (20 mg total) by mouth daily.     dexlansoprazole 60 MG capsule  Commonly known as:  DEXILANT  TAKE 1 CAPSULE (60 MG TOTAL) BY MOUTH DAILY.     diltiazem 360 MG 24 hr capsule  Commonly known as:  CARDIZEM CD  Take 1 capsule (360 mg total) by mouth daily.     feeding supplement (ENSURE ENLIVE) Liqd  Take 237 mLs by mouth daily.     fluticasone 50 MCG/ACT nasal spray  Commonly known as:  FLONASE  Place 2 sprays into both nostrils daily.     Fluticasone-Salmeterol 250-50 MCG/DOSE Aepb  Commonly known as:  ADVAIR DISKUS  Inhale 1 puff into the lungs 2 (two) times daily.     HYDROcodone-acetaminophen 5-325 MG tablet  Commonly known as:  NORCO/VICODIN  Take 1 tablet by mouth 2 (two) times daily as needed for moderate pain.     loratadine 10 MG tablet  Commonly known as:  CLARITIN  Take 1 tablet (10 mg total) by mouth daily.     metoprolol 100 MG tablet  Commonly known as:   LOPRESSOR  Take 1 tablet (100 mg total) by mouth 2 (two) times daily.     nitroGLYCERIN 0.4 MG SL tablet  Commonly known as:  NITROSTAT  Place 1 tablet (0.4 mg total) under the tongue every 5 (five) minutes as needed for chest pain.     predniSONE 20 MG tablet  Commonly known as:  DELTASONE  Take 3 tablets (60 mg total) by mouth 2 (two) times daily with a meal. X 2 days.  Then 2 tablets (40 mg) two times dailyl x 2 days;  Then 1 tab (20 mg) twice a day x 2 days     tiotropium 18 MCG inhalation capsule  Commonly known as:  SPIRIVA HANDIHALER  PLACE 1 CAPSULE (18 MCG TOTAL) INTO INHALER AND INHALE DAILY.     torsemide 20 MG tablet  Commonly known as:  DEMADEX  Take 1 tablet (20 mg total) by mouth once. Take one tablet by mouth if weight is 160 pounds or above     warfarin 5 MG tablet  Commonly known as:  COUMADIN  Take 0.5-1 tablets (2.5-5 mg total) by mouth See  admin instructions. Takes 1 tablet (5mg ) every Tuesdays and Fridays and 1/2 tablet (2.5 mg) all other days.        Discharge Assessment: Filed Vitals:   10/14/15 0532  BP: 151/68  Pulse: 94  Temp: 98.5 F (36.9 C)  Resp: 18   Skin clean, dry and intact without evidence of skin break down, no evidence of skin tears noted. IV catheter discontinued intact. Site without signs and symptoms of complications - no redness or edema noted at insertion site, patient denies c/o pain - only slight tenderness at site.  Dressing with slight pressure applied.  D/c Instructions-Education: Discharge instructions given to patient/family with verbalized understanding. D/c education completed with patient/family including follow up instructions, medication list, d/c activities limitations if indicated, with other d/c instructions as indicated by MD - patient able to verbalize understanding, all questions fully answered. Patient instructed to return to ED, call 911, or call MD for any changes in condition.  Patient escorted via WC, and D/C  home via private auto.  Camillo FlamingVicki L Chriss Mannan, RN 10/14/2015 11:11 AM

## 2015-10-14 NOTE — Consult Note (Signed)
   [facility]   10/14/2015  Alfredia FergusonDessie A Wyss 15-Dec-1933 [id number] Patient evaluated for community based chronic disease management services with [facility] as a benefit of [facility]. Spoke with patient, nephew, Bryna ColanderMarty Craig, and [name],  at bedside to explain [facility] services. [name] states we live right behind her.  Consent form signed.   Patient will receive post discharge transition of care call and will be evaluated for monthly home visits for assessments and disease process education.  Left contact information and [facility] literature with Marchelle FolksAmanda. Made Inpatient Case Manager aware that [facility] following. Of note, [facility] services does not replace or interfere with any services that are arranged by inpatient case management or social work.  For additional questions or referrals please contact:   Charlesetta ShanksVictoria Mayank Teuscher, RN BSN CCM [facility] Liaison  [phone] business mobile phone

## 2015-10-14 NOTE — Care Management Note (Signed)
Case Management Note  Patient Details  Name: Katie FergusonDessie A Melendez MRN: [id number] Date of Birth: 01-19-1933  Subjective/Objective:                  [facility]. Patient wishes to continue. Patient has oxygen at home prior to admission. [facility]  Action/Plan:  [facility] and notified of DC today.  Patient agreed to be followed by [facility]mitransitions through [facility] for [facility]na dx. Referral made.  Expected Discharge Date:                  Expected Discharge Plan:  Home w Home Health Services  In-House Referral:     Discharge planning Services  CM Consult  Post Acute Care Choice:  Home Health Choice offered to:  Patient  DME Arranged:    DME Agency:     HH Arranged:  RN, PT, OT [facility]  Status of Service:  Completed, signed off  Medicare Important Message Given:  Yes-second notification given Date Medicare IM Given:    Medicare IM give by:    Date Additional Medicare IM Given:    Additional Medicare Important Message give by:     If discussed at Long Length of Stay Meetings, dates discussed:    Additional Comments:  Lawerance SabalDebbie Shequila Neglia, RN 10/14/2015, 11:46 AM

## 2015-10-14 NOTE — Consult Note (Signed)
   [facility] CM Inpatient Consult   10/14/2015  MARELYN ROUSER 1933/06/27 [id number]  Referral received to assess for care management services. Met with the patient at bedside.  Explained that [place] Management is a covered benefit of [facility] insurance.   Review information for [facility] Management and a folder was provided with contact information.  Explained that [place] Management does not interfere with or replace any services arranged by the inpatient care management staff.  Patient states, "I would like you to go over this with my niece when she comes to pick me up."  Brochure left at the bedside with contact information. Natividad Brood, RN BSN [facility] Liaison  [phone] business mobile phone

## 2015-10-15 DIAGNOSIS — N189 Chronic kidney disease, unspecified: Secondary | ICD-10-CM | POA: Diagnosis not present

## 2015-10-15 DIAGNOSIS — I5032 Chronic diastolic (congestive) heart failure: Secondary | ICD-10-CM | POA: Diagnosis not present

## 2015-10-15 DIAGNOSIS — I2510 Atherosclerotic heart disease of native coronary artery without angina pectoris: Secondary | ICD-10-CM | POA: Diagnosis not present

## 2015-10-15 DIAGNOSIS — M1990 Unspecified osteoarthritis, unspecified site: Secondary | ICD-10-CM | POA: Diagnosis not present

## 2015-10-15 DIAGNOSIS — E785 Hyperlipidemia, unspecified: Secondary | ICD-10-CM | POA: Diagnosis not present

## 2015-10-15 DIAGNOSIS — Z9181 History of falling: Secondary | ICD-10-CM | POA: Diagnosis not present

## 2015-10-15 DIAGNOSIS — J449 Chronic obstructive pulmonary disease, unspecified: Secondary | ICD-10-CM | POA: Diagnosis not present

## 2015-10-15 DIAGNOSIS — Z9981 Dependence on supplemental oxygen: Secondary | ICD-10-CM | POA: Diagnosis not present

## 2015-10-15 DIAGNOSIS — I482 Chronic atrial fibrillation: Secondary | ICD-10-CM | POA: Diagnosis not present

## 2015-10-15 DIAGNOSIS — I130 Hypertensive heart and chronic kidney disease with heart failure and stage 1 through stage 4 chronic kidney disease, or unspecified chronic kidney disease: Secondary | ICD-10-CM | POA: Diagnosis not present

## 2015-10-16 ENCOUNTER — Ambulatory Visit (INDEPENDENT_AMBULATORY_CARE_PROVIDER_SITE_OTHER): Payer: Medicare Other | Admitting: Nurse Practitioner

## 2015-10-16 ENCOUNTER — Encounter: Payer: Self-pay | Admitting: Nurse Practitioner

## 2015-10-16 VITALS — BP 159/108 | HR 95 | Temp 97.1°F | Ht 60.0 in | Wt 172.0 lb

## 2015-10-16 DIAGNOSIS — J188 Other pneumonia, unspecified organism: Secondary | ICD-10-CM | POA: Diagnosis not present

## 2015-10-16 DIAGNOSIS — M810 Age-related osteoporosis without current pathological fracture: Secondary | ICD-10-CM | POA: Diagnosis not present

## 2015-10-16 DIAGNOSIS — E1122 Type 2 diabetes mellitus with diabetic chronic kidney disease: Secondary | ICD-10-CM | POA: Diagnosis not present

## 2015-10-16 DIAGNOSIS — I130 Hypertensive heart and chronic kidney disease with heart failure and stage 1 through stage 4 chronic kidney disease, or unspecified chronic kidney disease: Secondary | ICD-10-CM | POA: Diagnosis not present

## 2015-10-16 DIAGNOSIS — Z9981 Dependence on supplemental oxygen: Secondary | ICD-10-CM | POA: Diagnosis not present

## 2015-10-16 DIAGNOSIS — Z9181 History of falling: Secondary | ICD-10-CM | POA: Diagnosis not present

## 2015-10-16 DIAGNOSIS — E875 Hyperkalemia: Secondary | ICD-10-CM | POA: Diagnosis not present

## 2015-10-16 DIAGNOSIS — Z09 Encounter for follow-up examination after completed treatment for conditions other than malignant neoplasm: Secondary | ICD-10-CM

## 2015-10-16 DIAGNOSIS — I482 Chronic atrial fibrillation: Secondary | ICD-10-CM | POA: Diagnosis not present

## 2015-10-16 DIAGNOSIS — N189 Chronic kidney disease, unspecified: Secondary | ICD-10-CM | POA: Diagnosis not present

## 2015-10-16 DIAGNOSIS — J410 Simple chronic bronchitis: Secondary | ICD-10-CM | POA: Diagnosis not present

## 2015-10-16 DIAGNOSIS — J449 Chronic obstructive pulmonary disease, unspecified: Secondary | ICD-10-CM | POA: Diagnosis not present

## 2015-10-16 DIAGNOSIS — J189 Pneumonia, unspecified organism: Secondary | ICD-10-CM

## 2015-10-16 DIAGNOSIS — M545 Low back pain: Secondary | ICD-10-CM | POA: Diagnosis not present

## 2015-10-16 DIAGNOSIS — I5032 Chronic diastolic (congestive) heart failure: Secondary | ICD-10-CM | POA: Diagnosis not present

## 2015-10-16 DIAGNOSIS — N183 Chronic kidney disease, stage 3 (moderate): Secondary | ICD-10-CM | POA: Diagnosis not present

## 2015-10-16 DIAGNOSIS — E119 Type 2 diabetes mellitus without complications: Secondary | ICD-10-CM | POA: Insufficient documentation

## 2015-10-16 DIAGNOSIS — M1990 Unspecified osteoarthritis, unspecified site: Secondary | ICD-10-CM | POA: Diagnosis not present

## 2015-10-16 DIAGNOSIS — E785 Hyperlipidemia, unspecified: Secondary | ICD-10-CM | POA: Diagnosis not present

## 2015-10-16 DIAGNOSIS — I2510 Atherosclerotic heart disease of native coronary artery without angina pectoris: Secondary | ICD-10-CM | POA: Diagnosis not present

## 2015-10-16 DIAGNOSIS — I509 Heart failure, unspecified: Secondary | ICD-10-CM | POA: Diagnosis not present

## 2015-10-16 NOTE — Patient Outreach (Signed)
[facility]) Care Management  10/16/2015  Katie FergusonDessie A Melendez Jan 30, 1933 [id number]   Referral from Charlesetta ShanksVictoria Brewer, RN to assign [name] RN and SW, assigned [name], RN and [name], LCSW.  Thanks, Corrie MckusickLisa O. Sharlee BlewMoore, AABA [facility] Care Management [facility] CM Assistant Phone: [phone] Fax: [phone]

## 2015-10-16 NOTE — Progress Notes (Signed)
   Subjective:    Patient ID: [name], female    DOB: Sep 02, 1933, 81 y.o.   MRN: [id number]  HPI Patient in again for hospital follow up for pneumonia- was treated with IV antibiotics and was sent home on oral antibiotics and prednisone- finished augmentin but is still prednisone. She is doing better- NO SOB.  * They stopped her K tablet at hospital because her K was elevated- will recheck today. They also stopped her metformin.  Review of Systems  Constitutional: Negative.   HENT: Negative.   Respiratory: Positive for cough. Negative for shortness of breath.   Cardiovascular: Negative for chest pain and palpitations.  Gastrointestinal: Negative.   Genitourinary: Negative.   Neurological: Negative.   Psychiatric/Behavioral: Negative.   All other systems reviewed and are negative.      Objective:   Physical Exam  Constitutional: She is oriented to person, place, and time. She appears well-developed and well-nourished.  Cardiovascular: Normal rate, regular rhythm and normal heart sounds.   Pulmonary/Chest: Effort normal and breath sounds normal.  Musculoskeletal: Normal range of motion.  Neurological: She is alert and oriented to person, place, and time.  Skin: Skin is warm and dry.  Psychiatric: She has a normal mood and affect. Her behavior is normal. Judgment and thought content normal.   BP 159/108 mmHg  Pulse 95  Temp(Src) 97.1 F (36.2 C) (Oral)  Ht 5' (1.524 m)  Wt 172 lb (78.019 kg)  BMI 33.59 kg/m2  SpO2 94%  LMP 03/27/1983        Assessment & Plan:   1. HCAP (healthcare-associated pneumonia)   2. Hospital discharge follow-up   3. Hyperkalemia   4. Type 2 diabetes mellitus with stage 3 chronic kidney disease, without long-term current use of insulin (HCC)    Finish prednisone Continue oxygen as rx Go back on metformin daily- watch blood sugars and diet Will recheck K+ level to day and will call patient to discuss once labs are back Orders Placed  This Encounter  Procedures  . DME Hospital bed    Order Specific Question:  Patient has (list medical condition):    Answer:  OPD    Order Specific Question:  The above medical condition requires:    Answer:  Patient requires the ability to reposition frequently    Order Specific Question:  Head must be elevated greater than:    Answer:  30 degrees    Order Specific Question:  Bed type    Answer:  Semi-electric  . BMP8+EGFR    Mary-Margaret Hassell Done, [name]

## 2015-10-17 ENCOUNTER — Other Ambulatory Visit: Payer: Self-pay | Admitting: Nurse Practitioner

## 2015-10-17 ENCOUNTER — Other Ambulatory Visit: Payer: Self-pay | Admitting: *Deleted

## 2015-10-17 ENCOUNTER — Telehealth: Payer: Self-pay | Admitting: Nurse Practitioner

## 2015-10-17 ENCOUNTER — Other Ambulatory Visit: Payer: Self-pay | Admitting: Licensed Clinical Social Worker

## 2015-10-17 DIAGNOSIS — I5032 Chronic diastolic (congestive) heart failure: Secondary | ICD-10-CM | POA: Diagnosis not present

## 2015-10-17 DIAGNOSIS — M1990 Unspecified osteoarthritis, unspecified site: Secondary | ICD-10-CM | POA: Diagnosis not present

## 2015-10-17 DIAGNOSIS — I130 Hypertensive heart and chronic kidney disease with heart failure and stage 1 through stage 4 chronic kidney disease, or unspecified chronic kidney disease: Secondary | ICD-10-CM | POA: Diagnosis not present

## 2015-10-17 DIAGNOSIS — Z9181 History of falling: Secondary | ICD-10-CM | POA: Diagnosis not present

## 2015-10-17 DIAGNOSIS — E785 Hyperlipidemia, unspecified: Secondary | ICD-10-CM | POA: Diagnosis not present

## 2015-10-17 DIAGNOSIS — J449 Chronic obstructive pulmonary disease, unspecified: Secondary | ICD-10-CM | POA: Diagnosis not present

## 2015-10-17 DIAGNOSIS — I482 Chronic atrial fibrillation: Secondary | ICD-10-CM | POA: Diagnosis not present

## 2015-10-17 DIAGNOSIS — Z9981 Dependence on supplemental oxygen: Secondary | ICD-10-CM | POA: Diagnosis not present

## 2015-10-17 DIAGNOSIS — I2510 Atherosclerotic heart disease of native coronary artery without angina pectoris: Secondary | ICD-10-CM | POA: Diagnosis not present

## 2015-10-17 DIAGNOSIS — N189 Chronic kidney disease, unspecified: Secondary | ICD-10-CM | POA: Diagnosis not present

## 2015-10-17 LAB — BMP8+EGFR
BUN/Creatinine Ratio: 27 — ABNORMAL HIGH (ref 11–26)
BUN: 34 mg/dL — ABNORMAL HIGH (ref 8–27)
CO2: 24 mmol/L (ref 18–29)
Calcium: 8.3 mg/dL — ABNORMAL LOW (ref 8.7–10.3)
Chloride: 95 mmol/L — ABNORMAL LOW (ref 97–106)
Creatinine, Ser: 1.27 mg/dL — ABNORMAL HIGH (ref 0.57–1.00)
GFR calc Af Amer: 46 mL/min/{1.73_m2} — ABNORMAL LOW (ref >59)
GFR calc non Af Amer: 40 mL/min/{1.73_m2} — ABNORMAL LOW (ref >59)
Glucose: 162 mg/dL — ABNORMAL HIGH (ref 65–99)
Potassium: 4.7 mmol/L (ref 3.5–5.2)
Sodium: 137 mmol/L (ref 136–144)

## 2015-10-17 MED ORDER — GLUCOSE BLOOD VI STRP: ORAL_STRIP | Status: AC

## 2015-10-17 NOTE — Patient Outreach (Signed)
10/17/15- Telephone call to patient for transition of care week 1, pt hospitalized 10/13-10/18/16 for pneumonia. COPD-  Spoke with caregiver [name] (permission given on consent), per Marchelle FolksAmanda, pt has all medications and taking as prescribed, [facility] and states " they've seen her for quite awhile now"  Pt did receive flu vaccine and has stopped smoking with this hospitalization.  Pt is not taking potassium and had bloodwork done at MD office yesterday and awaiting results and call from MD as to whether pt is to take potassium.  Diabetes- CBG readings in 100-140's range and per Marchelle FolksAmanda pt has glucometer but has not been checking CBG regularly citing home health RN checks when she comes in, Pt has not refilled her strips or lancets,  RN CM will talk with MD office about frequency that pt is supposed to be checking CBG and will assist pt with getting refill on strips and lancets if needed. Hgb AIC 6.4 on 09/17/15 and caregiver feels diabetes is managed well at present. CHF- pt does have scale and is weighing daily and recording,  Weight today 169.4 pounds and caregiver feels CHF is well controlled at present.  Outpatient Encounter Prescriptions as of 10/17/2015  Medication Sig  . alendronate (FOSAMAX) 70 MG tablet Take 1 tablet (70 mg total) by mouth once a week. Take with a full glass of water on an empty stomach.  Marland Kitchen. atorvastatin (LIPITOR) 40 MG tablet TAKE 1 TABLET (40 MG TOTAL) BY MOUTH DAILY.  . busPIRone (BUSPAR) 7.5 MG tablet Take 1 tablet (7.5 mg total) by mouth 2 (two) times daily. .  . citalopram (CELEXA) 20 MG tablet Take 1 tablet (20 mg total) by mouth daily.  Marland Kitchen. dexlansoprazole (DEXILANT) 60 MG capsule TAKE 1 CAPSULE (60 MG TOTAL) BY MOUTH DAILY.  Marland Kitchen. diltiazem (CARDIZEM CD) 360 MG 24 hr capsule Take 1 capsule (360 mg total) by mouth daily.  . fluticasone (FLONASE) 50 MCG/ACT nasal spray Place 2 sprays into both nostrils daily.  . Fluticasone-Salmeterol  (ADVAIR DISKUS) 250-50 MCG/DOSE AEPB Inhale 1 puff into the lungs 2 (two) times daily.  Marland Kitchen. HYDROcodone-acetaminophen (NORCO/VICODIN) 5-325 MG per tablet Take 1 tablet by mouth 2 (two) times daily as needed for moderate pain. (Patient taking differently: Take 1 tablet by mouth every 6 (six) hours as needed for moderate pain. )  . loratadine (CLARITIN) 10 MG tablet Take 1 tablet (10 mg total) by mouth daily.  . metoprolol (LOPRESSOR) 100 MG tablet Take 1 tablet (100 mg total) by mouth 2 (two) times daily.  . nitroGLYCERIN (NITROSTAT) 0.4 MG SL tablet Place 1 tablet (0.4 mg total) under the tongue every 5 (five) minutes as needed for chest pain.  . predniSONE (DELTASONE) 20 MG tablet Take 3 tablets (60 mg total) by mouth 2 (two) times daily with a meal. X 2 days.  Then 2 tablets (40 mg) two times dailyl x 2 days;  Then 1 tab (20 mg) twice a day x 2 days  . tiotropium (SPIRIVA HANDIHALER) 18 MCG inhalation capsule PLACE 1 CAPSULE (18 MCG TOTAL) INTO INHALER AND INHALE DAILY.  Marland Kitchen. torsemide (DEMADEX) 20 MG tablet Take 1 tablet (20 mg total) by mouth once. Take one tablet by mouth if weight is 160 pounds or above  . warfarin (COUMADIN) 5 MG tablet Take 0.5-1 tablets (2.5-5 mg total) by mouth See admin instructions. Takes 1 tablet (5mg ) every Tuesdays and Fridays and 1/2 tablet (2.5 mg) all other days.  . feeding  supplement, ENSURE ENLIVE, (ENSURE ENLIVE) LIQD Take 237 mLs by mouth daily. (Patient not taking: Reported on 10/17/2015)   No facility-administered encounter medications on file as of 10/17/2015.    THN CM Care Plan Problem Two        Most Recent Value   Care Plan Problem Two  knowledge deficit related to COPD/ pneumonia   Role Documenting the Problem Two  Care Management Coordinator   Care Plan for Problem Two  Active   Interventions for Problem Two Long Term Goal   RN CM reviewed with caregiver the importance of taking all medications, inhalers as prescribed and calling MD early for change in  health status, symptoms, reviewed COPD action plan.   THN Long Term Goal (31-90) days  pt will have no readmissions related to COPD/ pneumonia within 90 days   THN Long Term Goal Start Date  10/17/15   THN CM Short Term Goal #1 (0-30 days)  pt will verbalize COPD action plan/ zones within 30 days.   THN CM Short Term Goal #1 Start Date  10/17/15   Interventions for Short Term Goal #2   RN CM reviewed COPD action plan and importance of knowing how you are feeling each day.      PLAN See pt for initial home visit 10/23/15 Continue weekly transition of care  [name] [facility], BSN [facility] Care Coordinator [phone]

## 2015-10-17 NOTE — Telephone Encounter (Signed)
It is in the computer 

## 2015-10-17 NOTE — Patient Outreach (Signed)
Assessment:  CSW received referral on [name].  CSW completed chart review on client on 10/17/15.  [facility].  She is a patient of Dr. Darlyn ReadStacks and regularly sees Mary-Margaret Daphine DeutscherMartin nurse practitioner at [facility]. Client is a former smoker, lives alone, but does have a niece who is supportive and lives near client.  Client has presented several tims to emergency room in the past two months seeking medical care.  Referral was made to CSW to help client review community resources related to repair to client's home and to assess client's situation related to other areas of need of client. RN [name] has also received referral on client to assist client with nursing needs of client.  CSW called client on 10/17/15 and verified identity of client.  CSW and client spoke of current needs of client.  She said she uses oxgyen continuously via nasal canula to help her breathe.  She uses walker to help her ambulate.  She said she sometimes has reduced appetite.  She has her prescribed medications and is taking medications on schedule.  She said her niece, [name], manages needs of client.  [name] checks on client status daily and is very supportive of [employer].  Client said she has physical therapy support, as scheduled, in the home. Home Health RN visits client as scheduled for nursing support. Client also has home health aide who will start helping client next week in the home as scheduled.  [facility]  Client has limited financial resources and her monthly income is used to pay for monthly expenses.  Client has leaking roof at her home. Roof is leaking in several places and has damaged some of the interior ceiling tiles at home of client.  CSW talked with client and with [name] related to possible community resources for client related to Building control surveyorroof repair.  [name] feels that  client may need an entire roof replaced on home of client.  CSW informed client that RN [name] would likely call client to discuss nursing needs of client.  Client is agreeable to speak with RN [name] about nursing needs of client. CSW and client also completed some of the needed [facility] assessments for client. CSW gave [facility] CSW phone number to client ([phone]) and encouraged that client or [name] call CSW at that number to discuss social work needs of client.  CSW thanked client and [name] for phone call with CSW on 10/17/15.  Plan: Client to take medications as prescribed and attend scheduled medical appointments. CSW to contact client/[name] next week to assess further the needs of client. CSW to collaborate with RN [name] in monitoring needs of client.  Kelton PillarMichael S.Azalyn Sliwa MSW, LCSW Licensed Clinical Social Worker [facility] Care Management [phone]

## 2015-10-18 NOTE — Telephone Encounter (Signed)
[facility]

## 2015-10-20 ENCOUNTER — Ambulatory Visit (INDEPENDENT_AMBULATORY_CARE_PROVIDER_SITE_OTHER): Payer: Medicare Other | Admitting: Pharmacist

## 2015-10-20 ENCOUNTER — Telehealth: Payer: Self-pay | Admitting: *Deleted

## 2015-10-20 ENCOUNTER — Other Ambulatory Visit: Payer: Self-pay | Admitting: *Deleted

## 2015-10-20 DIAGNOSIS — J449 Chronic obstructive pulmonary disease, unspecified: Secondary | ICD-10-CM | POA: Diagnosis not present

## 2015-10-20 DIAGNOSIS — I5032 Chronic diastolic (congestive) heart failure: Secondary | ICD-10-CM | POA: Diagnosis not present

## 2015-10-20 DIAGNOSIS — N189 Chronic kidney disease, unspecified: Secondary | ICD-10-CM | POA: Diagnosis not present

## 2015-10-20 DIAGNOSIS — I130 Hypertensive heart and chronic kidney disease with heart failure and stage 1 through stage 4 chronic kidney disease, or unspecified chronic kidney disease: Secondary | ICD-10-CM | POA: Diagnosis not present

## 2015-10-20 DIAGNOSIS — Z9181 History of falling: Secondary | ICD-10-CM | POA: Diagnosis not present

## 2015-10-20 DIAGNOSIS — Z9981 Dependence on supplemental oxygen: Secondary | ICD-10-CM | POA: Diagnosis not present

## 2015-10-20 DIAGNOSIS — E785 Hyperlipidemia, unspecified: Secondary | ICD-10-CM | POA: Diagnosis not present

## 2015-10-20 DIAGNOSIS — I482 Chronic atrial fibrillation: Secondary | ICD-10-CM | POA: Diagnosis not present

## 2015-10-20 DIAGNOSIS — I2510 Atherosclerotic heart disease of native coronary artery without angina pectoris: Secondary | ICD-10-CM | POA: Diagnosis not present

## 2015-10-20 DIAGNOSIS — M1990 Unspecified osteoarthritis, unspecified site: Secondary | ICD-10-CM | POA: Diagnosis not present

## 2015-10-20 LAB — POCT INR: INR: 1.6

## 2015-10-20 NOTE — Patient Outreach (Signed)
10/20/15- Received message that pt has Red flag on EMMI pneumonia call, telephone call to patient's home and line busy, RN CM called Ricki RodriguezAmanda Shoemaker and [name] is at patient's home, RN CM explained about the Red flag and [name] states "there are no problems today, she seems fine"  [name] did state that pt has had some problems with the phone call and could have given an incorrect answer and at times, pt has not answered the phone call, although family has encouraged her to do so and will assist with this.  [name] [facility], BSN [facility] Community Care Coordinator [phone]

## 2015-10-20 NOTE — Telephone Encounter (Signed)
INR-1.6 

## 2015-10-20 NOTE — Telephone Encounter (Signed)
subtherapeutic anticoagulation - current dose of warfarin is 5mg  on tuesdays and fridays and 2.5mg  all other days.  Patient instructed to take 7.5mg  today, then increase dose to 5mg  tuesdays, thursdays and saturdays.  Take 2.5mg  all other days.  Marchelle FolksAmanda - patients caregiven notified and [name] home health nurse notified.  Recheck INR on Friday, Octover 28th

## 2015-10-20 NOTE — Patient Outreach (Signed)
[facility](THN) Care Management  10/20/2015  Alfredia FergusonDessie A Winders [birth year] [id number]   Patient triggered RED on EMMI Pneumonia Dashboard, notification sent to [name], RN to outreach.  Thanks, Corrie MckusickLisa O. Sharlee BlewMoore, AABA [facility] Care Management [facility] CM Assistant Phone: [phone] Fax: [phone]

## 2015-10-21 ENCOUNTER — Other Ambulatory Visit: Payer: Self-pay | Admitting: *Deleted

## 2015-10-21 DIAGNOSIS — N189 Chronic kidney disease, unspecified: Secondary | ICD-10-CM | POA: Diagnosis not present

## 2015-10-21 DIAGNOSIS — I482 Chronic atrial fibrillation: Secondary | ICD-10-CM | POA: Diagnosis not present

## 2015-10-21 DIAGNOSIS — J449 Chronic obstructive pulmonary disease, unspecified: Secondary | ICD-10-CM | POA: Diagnosis not present

## 2015-10-21 DIAGNOSIS — M1990 Unspecified osteoarthritis, unspecified site: Secondary | ICD-10-CM | POA: Diagnosis not present

## 2015-10-21 DIAGNOSIS — Z9981 Dependence on supplemental oxygen: Secondary | ICD-10-CM | POA: Diagnosis not present

## 2015-10-21 DIAGNOSIS — E785 Hyperlipidemia, unspecified: Secondary | ICD-10-CM | POA: Diagnosis not present

## 2015-10-21 DIAGNOSIS — I5032 Chronic diastolic (congestive) heart failure: Secondary | ICD-10-CM | POA: Diagnosis not present

## 2015-10-21 DIAGNOSIS — I130 Hypertensive heart and chronic kidney disease with heart failure and stage 1 through stage 4 chronic kidney disease, or unspecified chronic kidney disease: Secondary | ICD-10-CM | POA: Diagnosis not present

## 2015-10-21 DIAGNOSIS — I2510 Atherosclerotic heart disease of native coronary artery without angina pectoris: Secondary | ICD-10-CM | POA: Diagnosis not present

## 2015-10-21 DIAGNOSIS — Z9181 History of falling: Secondary | ICD-10-CM | POA: Diagnosis not present

## 2015-10-21 NOTE — Patient Outreach (Signed)
[facility]) Care Management  10/21/2015  Katie FergusonDessie A Melendez Sep 25, 1933 [id number]   RED on EMMI Pneumonia Dashboard, notification sent to [name], RN.  Thanks, Corrie MckusickLisa O. Sharlee BlewMoore, AABA [facility] Care Management [facility] CM Assistant Phone: [phone] Fax: [phone]

## 2015-10-21 NOTE — Patient Outreach (Signed)
10/21/15- Red flag on EMMI pneumonia, telephone call to pt for followup , HIPAA verified, pt states " I don't even know what you're talking about".  Pt states she has no dyspnea, no cough, feels fine today, has slight edema in lower extremities, no new issues or problems reported, pt states " I'm breathing fine".  PLAN RN CM to see pt for initial home visit on 10/23/15  [name] [facility], BSN [facility] Community Care Coordinator [phone]

## 2015-10-22 ENCOUNTER — Telehealth: Payer: Self-pay | Admitting: Nurse Practitioner

## 2015-10-23 ENCOUNTER — Other Ambulatory Visit: Payer: Self-pay | Admitting: Licensed Clinical Social Worker

## 2015-10-23 ENCOUNTER — Telehealth: Payer: Self-pay | Admitting: Nurse Practitioner

## 2015-10-23 ENCOUNTER — Encounter: Payer: Self-pay | Admitting: *Deleted

## 2015-10-23 ENCOUNTER — Other Ambulatory Visit: Payer: Self-pay | Admitting: *Deleted

## 2015-10-23 DIAGNOSIS — I2510 Atherosclerotic heart disease of native coronary artery without angina pectoris: Secondary | ICD-10-CM | POA: Diagnosis not present

## 2015-10-23 DIAGNOSIS — I482 Chronic atrial fibrillation: Secondary | ICD-10-CM | POA: Diagnosis not present

## 2015-10-23 DIAGNOSIS — I5032 Chronic diastolic (congestive) heart failure: Secondary | ICD-10-CM | POA: Diagnosis not present

## 2015-10-23 DIAGNOSIS — I130 Hypertensive heart and chronic kidney disease with heart failure and stage 1 through stage 4 chronic kidney disease, or unspecified chronic kidney disease: Secondary | ICD-10-CM | POA: Diagnosis not present

## 2015-10-23 DIAGNOSIS — E785 Hyperlipidemia, unspecified: Secondary | ICD-10-CM | POA: Diagnosis not present

## 2015-10-23 DIAGNOSIS — Z9181 History of falling: Secondary | ICD-10-CM | POA: Diagnosis not present

## 2015-10-23 DIAGNOSIS — J449 Chronic obstructive pulmonary disease, unspecified: Secondary | ICD-10-CM | POA: Diagnosis not present

## 2015-10-23 DIAGNOSIS — N189 Chronic kidney disease, unspecified: Secondary | ICD-10-CM | POA: Diagnosis not present

## 2015-10-23 DIAGNOSIS — M1990 Unspecified osteoarthritis, unspecified site: Secondary | ICD-10-CM | POA: Diagnosis not present

## 2015-10-23 DIAGNOSIS — Z9981 Dependence on supplemental oxygen: Secondary | ICD-10-CM | POA: Diagnosis not present

## 2015-10-23 MED ORDER — ONETOUCH ULTRASOFT LANCETS MISC: Status: AC

## 2015-10-23 NOTE — Patient Outreach (Signed)
Assessment: CSW received communication on 10/23/15 from RN [name] regarding needs of client. [name] requested that CSW contact doctor of client to request Personal Care Services application for client be completed by client's doctor and [facility]ed to [facility] for review.  CSW called Hilton CorkGina Rintelmann, nurse, at [facility], on 10/23/15. CSW verified identity of Almira CoasterGina, nurse at practice.  Almira CoasterGina and CSW spoke of needs of client.  CSW requested that Almira CoasterGina, RN, please talk with Dr. Mechele ClaudeWarren Stacks to request that Dr. Darlyn ReadStacks please complete Personal Care Services application for client and have completed application faxed to [facility] for review. Almira CoasterGina received information and said she would make sure that this application for Personal Care Services was completed by doctor and [facility]ed to [facility]. CSW also talked with Almira CoasterGina about poor housing (roof condition of client's home) and that RN and CSW were looking into community resources to help client with roof repair needs.  CSW informed Almira CoasterGina also that RN [name] had made home visit to home of client on 10/23/15.  CSW thanked Almira CoasterGina for her assistance in addressing needs of client. , Plan: Client to communicate with medical doctor, as needed, to discuss medical needs of client. Almira CoasterGina, RN, at [facility], to communicate with Dr. Darlyn ReadStacks about Personal Care Services application completion for client. Almira CoasterGina, RN at [facility], to ensure Personal Care Services application for client is completed and faxed when completed to [facility]. CSW to call client in two weeks to assess needs of client.  Kelton PillarMichael S.Velvia Mehrer MSW, LCSW Licensed Clinical Social Worker [facility] Care Management [phone]

## 2015-10-23 NOTE — Progress Notes (Signed)
[facility] per [facility] Martin

## 2015-10-23 NOTE — Patient Outreach (Signed)
[facility]) Care Management   10/23/2015  [name] 1933-05-11 [id number]  [name] is an 81 y.o. female  Subjective: Initial home visit with pt, HIPAA verified. Nephew [name] and his wife [name] are present and live directly behind pt on the same property, [name] states the roof is "about to cave in at several places in the home" pt has no money to pay for roof repairs, pt states she has telemonitoring through [facility] and "have problems with the tablet at times"  [name] states he and [name] are tired and burned out with things as they are the sole caregivers for [facility] and would be interested in [facility] or CAP program, pt receives meals on wheels. [name] prefills medication box.  Objective:   Filed Vitals:   10/23/15 1529  BP: 128/70  Pulse: 74  Resp: 16  Height: 1.524 m (5')  Weight: 169 lb 12.8 oz (77.021 kg)  SpO2: 98%    ROS  Physical Exam  Constitutional: She is oriented to person, place, and time. She appears well-developed and well-nourished.  HENT:  Head: Normocephalic.  Neck: Normal range of motion. Neck supple.  Cardiovascular:  Irregular rhythym  Respiratory: Effort normal and breath sounds normal.  Dyspnea with exertion  GI: Soft. Bowel sounds are normal.  Musculoskeletal: She exhibits edema.  2-3+ edema lower extremities bil  Neurological: She is alert and oriented to person, place, and time.  Forgetful at times  Skin: Skin is warm and dry.  Psychiatric: She has a normal mood and affect. Her behavior is normal. Judgment and thought content normal.    Current Medications:   Current Outpatient Prescriptions  Medication Sig Dispense Refill  . alendronate (FOSAMAX) 70 MG tablet Take 1 tablet (70 mg total) by mouth once a week. Take with a full glass of water on an empty stomach. 12 tablet 1  . atorvastatin (LIPITOR) 40 MG tablet TAKE 1 TABLET (40 MG TOTAL) BY MOUTH DAILY. 90 tablet 1  . busPIRone (BUSPAR) 7.5 MG tablet Take 1  tablet (7.5 mg total) by mouth 2 (two) times daily. . 60 tablet 5  . citalopram (CELEXA) 20 MG tablet Take 1 tablet (20 mg total) by mouth daily. 30 tablet 1  . dexlansoprazole (DEXILANT) 60 MG capsule TAKE 1 CAPSULE (60 MG TOTAL) BY MOUTH DAILY. 90 capsule 1  . diltiazem (CARDIZEM CD) 360 MG 24 hr capsule Take 1 capsule (360 mg total) by mouth daily. 30 capsule 1  . fluticasone (FLONASE) 50 MCG/ACT nasal spray Place 2 sprays into both nostrils daily. 16 g 2  . Fluticasone-Salmeterol (ADVAIR DISKUS) 250-50 MCG/DOSE AEPB Inhale 1 puff into the lungs 2 (two) times daily. 60 each 5  . glucose blood (ONETOUCH VERIO) test strip Test 1x per day and prn   Dx- e11.9 100 each 12  . HYDROcodone-acetaminophen (NORCO/VICODIN) 5-325 MG per tablet Take 1 tablet by mouth 2 (two) times daily as needed for moderate pain. (Patient taking differently: Take 1 tablet by mouth every 6 (six) hours as needed for moderate pain. ) 60 tablet 0  . loratadine (CLARITIN) 10 MG tablet Take 1 tablet (10 mg total) by mouth daily. 30 tablet 0  . metFORMIN (GLUMETZA) 500 MG (MOD) 24 hr tablet Take 500 mg by mouth daily with breakfast.    . metoprolol (LOPRESSOR) 100 MG tablet Take 1 tablet (100 mg total) by mouth 2 (two) times daily. 60 tablet 3  . nitroGLYCERIN (NITROSTAT) 0.4 MG SL tablet Place 1  tablet (0.4 mg total) under the tongue every 5 (five) minutes as needed for chest pain. 25 tablet 6  . tiotropium (SPIRIVA HANDIHALER) 18 MCG inhalation capsule PLACE 1 CAPSULE (18 MCG TOTAL) INTO INHALER AND INHALE DAILY. 90 capsule 1  . torsemide (DEMADEX) 20 MG tablet Take 1 tablet (20 mg total) by mouth once. Take one tablet by mouth if weight is 160 pounds or above 90 tablet 1  . warfarin (COUMADIN) 5 MG tablet Take 0.5-1 tablets (2.5-5 mg total) by mouth See admin instructions. Takes 1 tablet ( ) every Tuesdays and Fridays and 1/2 tablet (2.5 mg) all other days. 60 tablet 5  . feeding supplement, ENSURE ENLIVE, (ENSURE ENLIVE) LIQD  Take 237 mLs by mouth daily. (Patient not taking: Reported on 10/17/2015) 60 Bottle 0  . [DISCONTINUED] diltiazem (TIAZAC) 180 MG 24 hr capsule Take 1 capsule (180 mg total) by mouth daily. 90 capsule 1   No current facility-administered medications for this visit.    Functional Status:   In your present state of health, do you have any difficulty performing the following activities: 10/23/2015 10/17/2015  Hearing? N Y  Vision? N N  Difficulty concentrating or making decisions? Malvin Johns  Walking or climbing stairs? Y Y  Dressing or bathing? Y N  Doing errands, shopping? Malvin Johns  Preparing Food and eating ? Y Y  Using the Toilet? N N  In the past six months, have you accidently leaked urine? N N  Do you have problems with loss of bowel control? N N  Managing your Medications? Y Y  Managing your Finances? Malvin Johns  Housekeeping or managing your Housekeeping? Malvin Johns    Fall/Depression Screening:    PHQ 2/9 Scores 10/17/2015 09/16/2015 08/11/2015 06/16/2015 02/19/2015 10/28/2014 07/26/2014  PHQ - 2 Score 0 0 0 0  PHQ- 9 Score - - - -   Fall Risk  10/23/2015 10/17/2015 09/16/2015 06/16/2015 02/19/2015  Falls in the past year? Yes Yes Yes Yes Yes  Number falls in past yr: 2 or more 2 or more - 1 1  Injury with Fall? No No - No No  Risk Factor Category  High Fall Risk High Fall Risk - - -  Risk for fall due to : History of fall(s);Impaired balance/gait History of fall(s);Impaired balance/gait - - -  Follow up Education provided Education provided - - -   Assessment:  RN CM focused on CHF, COPD teaching (see care plan), pt feels diabetes is under good under control. CBG readings are in 100's range and last Hgb AIC 6.4.  [facility], pt has strips ready to pickup and [facility] will [facility] primary MD as lancets were not called in.  RN CM faxed initial home visit and barrier letter to primary MD office/ Paulene Floor NP, sent reminder pt does want hospital bed from [facility] and ask for follow up on  order/ notes being sent.  RN CM called [employer] at [phone] and spoke with Harvie Heck about their program for home repairs, Harvie Heck reports they just received new grant and pt needs to call their office to have application mailed to her home, nephew [name] did this during home visit.  RN CM sent note to [facility] CSW Lorin Picket Forrest reporting above mentioned resource for the roof repairs, pt wants HCPOA completed, has level of care issues with caregiver burnout, fatigue, is interested in [facility] for in home assistance.  Pt [facility] have [facility].  RN CM gave pt some lancets to get her through until hers are ready at pharmacy.  THN CM Care Plan Problem One        Most Recent Value   Care Plan Problem One  Knowledge deficit related to management of CHF   Role Documenting the Problem One  Care Management Coordinator   Care Plan for Problem One  Active   THN Long Term Goal (31-90 days)  pt will have no readmissions related to CHF within 90 days.   THN Long Term Goal Start Date  10/23/15   THN CM Short Term Goal #1 (0-30 days)  pt and caregiver will verbalize CHF zones/ action plan within 30 days.   THN CM Short Term Goal #1 Start Date  10/23/15   Interventions for Short Term Goal #1  RN CM gave pt copy of CHF zones/ action plan and reviewed, reviewed resources to call and when, observed all medications and reviewed with caregiver and pt, gave [facility] calendar, pt already has 24 hour nurse line magnet., RN CM assisted pt and caregiver with using telemonitoring equipment from [facility] as pt has problems in the past.    [facility] CM Care Plan Problem Two        Most Recent Value   Care Plan Problem Two  knowledge deficit related to COPD/ pneumonia   Role Documenting the Problem Two  Care Management Coordinator   Care Plan for Problem Two  Active   Interventions for Problem Two Long Term Goal   RN CM reinforced with caregiver the importance of taking all medications, inhalers as prescribed  and calling MD early for change in health status, symptoms, reviewed COPD action plan.   THN Long Term Goal (31-90) days  pt will have no readmissions related to COPD/ pneumonia within 90 days   THN Long Term Goal Start Date  10/17/15   THN CM Short Term Goal #1 (0-30 days)  pt will verbalize COPD action plan/ zones within 30 days.   THN CM Short Term Goal #1 Start Date  10/17/15   Interventions for Short Term Goal #2   RN CM reinforced COPD action plan and importance of knowing how you are feeling each day., gave pt copy of COPD zones/action plan and ask pt and caregiver to review      Plan: follow up with home visit 11/14/15 Continue CHF and COPD teaching, reinforcement Collaborate with [facility] CSW regarding PCS, HCPOA, roofing issues. Follow up on hospital bed, lancets for glucometer.  Irving ShowsJulie Nalany Steedley [facility] HospitalRNC, BSN [facility] Community Care Coordinator [phone]

## 2015-10-23 NOTE — Patient Outreach (Signed)
Assessment: CSW called [employer]  [facility] on 10/23/15 at [phone].  CSW [facility] and talked with representative at agency about client needs.  Representative at agency said that [facility] would not repair entire roof pf client but could look at roof of client to see if basic, simple roof leak repairs might be made by group. Representative of agency also said that client is responsible for purchasing all materials to complete needed repairs. She said that agency did not buy needed repair materials but that this purchase was responsibility of the client receiving repairs.  CSW gave agency representative contact information for client and for [name], niece of client.  Agency representative said that volunteer group leader would contact client or [name] to discuss current repair needs of client at home of client. CSW thanked Technical brewerrepresentative of [place] [facility] [address] [facility].  Plan: Volunteer group leader of [place] [facility] to call client or [name] to discuss repair needs for client's roof. CSW to call client/[name] in two weeks to assess needs of client. CSW informed RN [name] of above information on 10/23/15.  Kelton PillarMichael S.Sydnei Ohaver MSW, LCSW Licensed Clinical Social Worker [facility] [phone]

## 2015-10-23 NOTE — Patient Outreach (Signed)
Assessment: CSW called home phone number of client on 10/23/15 and spoke via phone with client on 10/23/15. CSW verified identity of client. CSW and client spoke of current client needs. Client said she had leaks in her roof at her home.  CSW informed Ashia on 10/23/15 that CSW had called [address] [place] [facility] on 10/23/15 and had added client to the list to receive [facility]. CSW informed [name] that agency had name and address of client to contact client about roof repair ( roof leak repairs).  Also CSW informed [name] that agency had name and phone number of [name], niece of client, to call [name] as needed to discuss possible support for client through agency. [name] was appreciative of this support and help by CSW and of linking client to support of [facility]. Client said she had her prescribed medications and was taking her medications as prescribed. She said she has adequate food supply. She said she is sleeping well.  Client has support from her niece, [name].  Client sees [facility] nurse [facility] as a medical provider.  Client had an appointment with [name] on 10/16/15.  [facility] with RN [name].  RN [name] conducted home visit with client on 10/23/15. CSW thanked client for phone call with CSW on 10/23/15. CSW encouraged [name] to call CSW at [phone] as needed to discuss social work needs of client.  Plan: Client to take medications as prescribed and attend scheduled medical appointments. Client to communicate with RN [name], as needed, to discuss nursing needs of client. CSW to call client in 4 weeks to assess needs of client at that time.  Kelton PillarMichael S.Ballard Budney MSW, LCSW Licensed Clinical Social Worker [facility] [phone]

## 2015-10-24 ENCOUNTER — Telehealth: Payer: Self-pay | Admitting: *Deleted

## 2015-10-24 ENCOUNTER — Ambulatory Visit (INDEPENDENT_AMBULATORY_CARE_PROVIDER_SITE_OTHER): Payer: Medicare Other | Admitting: Pharmacist

## 2015-10-24 ENCOUNTER — Telehealth: Payer: Self-pay | Admitting: Nurse Practitioner

## 2015-10-24 DIAGNOSIS — R609 Edema, unspecified: Secondary | ICD-10-CM

## 2015-10-24 DIAGNOSIS — I130 Hypertensive heart and chronic kidney disease with heart failure and stage 1 through stage 4 chronic kidney disease, or unspecified chronic kidney disease: Secondary | ICD-10-CM | POA: Diagnosis not present

## 2015-10-24 DIAGNOSIS — I2510 Atherosclerotic heart disease of native coronary artery without angina pectoris: Secondary | ICD-10-CM | POA: Diagnosis not present

## 2015-10-24 DIAGNOSIS — E785 Hyperlipidemia, unspecified: Secondary | ICD-10-CM | POA: Diagnosis not present

## 2015-10-24 DIAGNOSIS — I5032 Chronic diastolic (congestive) heart failure: Secondary | ICD-10-CM | POA: Diagnosis not present

## 2015-10-24 DIAGNOSIS — Z9181 History of falling: Secondary | ICD-10-CM | POA: Diagnosis not present

## 2015-10-24 DIAGNOSIS — J449 Chronic obstructive pulmonary disease, unspecified: Secondary | ICD-10-CM | POA: Diagnosis not present

## 2015-10-24 DIAGNOSIS — Z9981 Dependence on supplemental oxygen: Secondary | ICD-10-CM | POA: Diagnosis not present

## 2015-10-24 DIAGNOSIS — M1990 Unspecified osteoarthritis, unspecified site: Secondary | ICD-10-CM | POA: Diagnosis not present

## 2015-10-24 DIAGNOSIS — N189 Chronic kidney disease, unspecified: Secondary | ICD-10-CM | POA: Diagnosis not present

## 2015-10-24 DIAGNOSIS — I482 Chronic atrial fibrillation: Secondary | ICD-10-CM | POA: Diagnosis not present

## 2015-10-24 LAB — POCT INR: INR: 2.5

## 2015-10-24 NOTE — Telephone Encounter (Signed)
Copy of order faxed to [facility].  Attempted to contact patient

## 2015-10-24 NOTE — Telephone Encounter (Signed)
INR was WNL - continue current warfarin dose of 5mg  on tuesdays, Thursday and Saturday.  5mg  all other days.  Also discussed edema with [name] home health nurse - patient's edema is increase in leg / ankles. Patient is currently taking torsemide as needed for weight increases greater than 2# in 24 hours and if her takes a torsemide she takes a potassium.  I recommended that she take torsemide one tablet daily and take potassium 1 tablet daily.  [name] will check BMET on Monday when she sees Mrs. [name].   I tried to call patient to discussed medication recommendations but [name] who is her caregiver was not available but would be there soon so I will call back or have nurse call back.

## 2015-10-24 NOTE — Telephone Encounter (Signed)
INR 2.5

## 2015-10-25 NOTE — Telephone Encounter (Signed)
Patient daughter called stating that both of herlower ext are swollen and red today- cool to touch- no drainage- they wanted to know what to do- told to take 2 demadex this morning and one at 1pm and keep legs elevated today- if not improving they were told to call back.

## 2015-10-26 ENCOUNTER — Emergency Department (HOSPITAL_COMMUNITY): Payer: Medicare Other

## 2015-10-26 ENCOUNTER — Emergency Department (HOSPITAL_COMMUNITY)
Admission: EM | Admit: 2015-10-26 | Discharge: 2015-10-26 | Disposition: A | Discharge status: Home / Self Care | Payer: Medicare Other | Attending: Emergency Medicine | Admitting: Emergency Medicine

## 2015-10-26 ENCOUNTER — Encounter (HOSPITAL_COMMUNITY): Payer: Self-pay | Admitting: *Deleted

## 2015-10-26 DIAGNOSIS — Z79899 Other long term (current) drug therapy: Secondary | ICD-10-CM | POA: Insufficient documentation

## 2015-10-26 DIAGNOSIS — J449 Chronic obstructive pulmonary disease, unspecified: Secondary | ICD-10-CM | POA: Diagnosis not present

## 2015-10-26 DIAGNOSIS — L539 Erythematous condition, unspecified: Secondary | ICD-10-CM | POA: Insufficient documentation

## 2015-10-26 DIAGNOSIS — E785 Hyperlipidemia, unspecified: Secondary | ICD-10-CM | POA: Insufficient documentation

## 2015-10-26 DIAGNOSIS — R600 Localized edema: Secondary | ICD-10-CM | POA: Insufficient documentation

## 2015-10-26 DIAGNOSIS — Z87891 Personal history of nicotine dependence: Secondary | ICD-10-CM | POA: Diagnosis not present

## 2015-10-26 DIAGNOSIS — R609 Edema, unspecified: Secondary | ICD-10-CM

## 2015-10-26 DIAGNOSIS — M8580 Other specified disorders of bone density and structure, unspecified site: Secondary | ICD-10-CM | POA: Diagnosis not present

## 2015-10-26 DIAGNOSIS — H269 Unspecified cataract: Secondary | ICD-10-CM | POA: Insufficient documentation

## 2015-10-26 DIAGNOSIS — I10 Essential (primary) hypertension: Secondary | ICD-10-CM | POA: Insufficient documentation

## 2015-10-26 DIAGNOSIS — Z8781 Personal history of (healed) traumatic fracture: Secondary | ICD-10-CM | POA: Diagnosis not present

## 2015-10-26 DIAGNOSIS — I2510 Atherosclerotic heart disease of native coronary artery without angina pectoris: Secondary | ICD-10-CM | POA: Diagnosis not present

## 2015-10-26 DIAGNOSIS — J9811 Atelectasis: Secondary | ICD-10-CM | POA: Diagnosis not present

## 2015-10-26 DIAGNOSIS — I482 Chronic atrial fibrillation: Secondary | ICD-10-CM | POA: Insufficient documentation

## 2015-10-26 DIAGNOSIS — Z8719 Personal history of other diseases of the digestive system: Secondary | ICD-10-CM | POA: Diagnosis not present

## 2015-10-26 DIAGNOSIS — I509 Heart failure, unspecified: Secondary | ICD-10-CM | POA: Diagnosis not present

## 2015-10-26 DIAGNOSIS — J90 Pleural effusion, not elsewhere classified: Secondary | ICD-10-CM | POA: Diagnosis not present

## 2015-10-26 DIAGNOSIS — I252 Old myocardial infarction: Secondary | ICD-10-CM | POA: Diagnosis not present

## 2015-10-26 DIAGNOSIS — Z7951 Long term (current) use of inhaled steroids: Secondary | ICD-10-CM | POA: Diagnosis not present

## 2015-10-26 DIAGNOSIS — M1990 Unspecified osteoarthritis, unspecified site: Secondary | ICD-10-CM | POA: Diagnosis not present

## 2015-10-26 LAB — BASIC METABOLIC PANEL
Anion gap: 10 (ref 5–15)
BUN: 16 mg/dL (ref 6–20)
CO2: 32 mmol/L (ref 22–32)
Calcium: 8.5 mg/dL — ABNORMAL LOW (ref 8.9–10.3)
Chloride: 92 mmol/L — ABNORMAL LOW (ref 101–111)
Creatinine, Ser: 1.66 mg/dL — ABNORMAL HIGH (ref 0.44–1.00)
GFR calc Af Amer: 33 mL/min — ABNORMAL LOW (ref >60)
GFR calc non Af Amer: 28 mL/min — ABNORMAL LOW (ref >60)
Glucose, Bld: 114 mg/dL — ABNORMAL HIGH (ref 65–99)
Potassium: 4.1 mmol/L (ref 3.5–5.1)
Sodium: 134 mmol/L — ABNORMAL LOW (ref 135–145)

## 2015-10-26 LAB — CBC
HCT: 41.6 % (ref 36.0–46.0)
Hemoglobin: 13.6 g/dL (ref 12.0–15.0)
MCH: 30.6 pg (ref 26.0–34.0)
MCHC: 32.7 g/dL (ref 30.0–36.0)
MCV: 93.5 fL (ref 78.0–100.0)
Platelets: 99 10*3/uL — ABNORMAL LOW (ref 150–400)
RBC: 4.45 MIL/uL (ref 3.87–5.11)
RDW: 14 % (ref 11.5–15.5)
WBC: 13.8 10*3/uL — ABNORMAL HIGH (ref 4.0–10.5)

## 2015-10-26 LAB — TROPONIN I: Troponin I: <0.03 ng/mL (ref <0.031)

## 2015-10-26 NOTE — ED Provider Notes (Signed)
CSN: [id number]     Arrival date & time 10/26/15  1331 History   First MD Initiated Contact with Patient 10/26/15 1707     Chief Complaint  Patient presents with  . Leg Swelling     (Consider location/radiation/quality/duration/timing/severity/associated sxs/prior Treatment) HPI   [name] is a 81 y.o. female who presents for evaluation of bilateral lower extremity swelling, for several days, despite taking medications. Her daughter states that her legs are draining "clear fluid". Yesterday, her PCP suggested taking extra diuretic which she did. She took 2 extra fluid pills. She monitors her weight at home and it has been "up and down". She was hospitalized about 2 weeks ago for age. She was able smoking since she was discharged from the hospital. She denies fever, chills, cough, chest pain, weakness or dizziness. There are no other no modifying factors.   Past Medical History  Diagnosis Date  . Hypertension   . CAD (coronary artery disease) 5/96  . Edema   . Hyperlipidemia   . Chronic atrial fibrillation (HCC)     Coumadin therapy   . Osteopenia   . Renal insufficiency   . Closed right ankle fracture   . CHF (congestive heart failure) (HCC)   . LBP (low back pain)   . Arthritis   . Cataract   . COPD (chronic obstructive pulmonary disease) (HCC)     wears O2 at home as needed  . Esophageal stricture   . Esophageal dysmotility 09/2011    seen on barium esophagram.  . Carotid artery occlusion   . Myocardial infarction [facility])    Past Surgical History  Procedure Laterality Date  . Heart stent      X2  . Mole removed  2013    umbilicus  . Esophagogastroduodenoscopy  2012, 05/2013,09/2013    dilation of esophagus and stricture in 09/2011, 05/2013.  meat disimpaction 09/2013  . Esophagogastroduodenoscopy N/A 10/18/2013    Procedure: ESOPHAGOGASTRODUODENOSCOPY (EGD);  Surgeon: Meryl Dare, MD;  Location: [facility] ENDOSCOPY;  Service: Endoscopy;  Laterality: N/A;  possible  food impaction   Family History  Problem Relation Age of Onset  . Colon cancer Neg Hx   . Esophageal cancer Neg Hx   . Stomach cancer Neg Hx   . Rectal cancer Neg Hx   . Hyperlipidemia Mother   . Hypertension Mother   . Hyperlipidemia Father   . Heart disease Father     After age 66  . Cancer Sister     Lung  . Hyperlipidemia Sister   . Hypertension Sister    Social History  Substance Use Topics  . Smoking status: Former Smoker -- 0.50 packs/day    Types: Cigarettes  . Smokeless tobacco: Never Used     Comment: pt quit with last hospitalization  . Alcohol Use: No     Comment: previous   OB History    No data available     Review of Systems  All other systems reviewed and are negative.     Allergies  Review of patient's allergies indicates no known allergies.  Home Medications   Prior to Admission medications   Medication Sig Start Date End Date Taking? Authorizing Provider  alendronate (FOSAMAX) 70 MG tablet Take 1 tablet (70 mg total) by mouth once a week. Take with a full glass of water on an empty stomach. 09/16/15   Mary-Margaret Daphine Deutscher, [name]  atorvastatin (LIPITOR) 40 MG tablet TAKE 1 TABLET (40 MG TOTAL) BY MOUTH DAILY. 09/16/15  Mary-Margaret Daphine Deutscher, [name]  busPIRone (BUSPAR) 7.5 MG tablet Take 1 tablet (7.5 mg total) by mouth 2 (two) times daily. . 09/24/15   Mechele Claude, MD  citalopram (CELEXA) 20 MG tablet Take 1 tablet (20 mg total) by mouth daily. 10/14/15   Catarina Hartshorn, MD  dexlansoprazole (DEXILANT) 60 MG capsule TAKE 1 CAPSULE (60 MG TOTAL) BY MOUTH DAILY. 09/16/15   Mary-Margaret Daphine Deutscher, [name]  diltiazem (CARDIZEM CD) 360 MG 24 hr capsule Take 1 capsule (360 mg total) by mouth daily. 10/14/15   Catarina Hartshorn, MD  feeding supplement, ENSURE ENLIVE, (ENSURE ENLIVE) LIQD Take 237 mLs by mouth daily. Patient not taking: Reported on 10/17/2015 09/18/15   Maretta Bees, MD  fluticasone [facility]) 50 MCG/ACT nasal spray Place 2 sprays into both nostrils daily.  10/14/15   Catarina Hartshorn, MD  Fluticasone-Salmeterol (ADVAIR DISKUS) 250-50 MCG/DOSE AEPB Inhale 1 puff into the lungs 2 (two) times daily. 06/16/15   Mary-Margaret Daphine Deutscher, [name]  glucose blood (ONETOUCH VERIO) test strip Test 1x per day and prn   Dx- e11.9 10/17/15   Mary-Margaret Daphine Deutscher, [name]  HYDROcodone-acetaminophen (NORCO/VICODIN) 5-325 MG per tablet Take 1 tablet by mouth 2 (two) times daily as needed for moderate pain. Patient taking differently: Take 1 tablet by mouth every 6 (six) hours as needed for moderate pain.  09/10/15   Mary-Margaret Daphine Deutscher, [name]  Lancets [facility] ULTRASOFT) lancets Use as instructed 10/23/15   Mary-Margaret Daphine Deutscher, [name]  loratadine (CLARITIN) 10 MG tablet Take 1 tablet (10 mg total) by mouth daily. 10/14/15   Catarina Hartshorn, MD  metFORMIN (GLUMETZA) 500 MG (MOD) 24 hr tablet Take 500 mg by mouth daily with breakfast.    Historical Provider, MD  metoprolol (LOPRESSOR) 100 MG tablet Take 1 tablet (100 mg total) by mouth 2 (two) times daily. 10/06/15   Mary-Margaret Daphine Deutscher, [name]  nitroGLYCERIN (NITROSTAT) 0.4 MG SL tablet Place 1 tablet (0.4 mg total) under the tongue every 5 (five) minutes as needed for chest pain. 04/10/14   Rollene Rotunda, MD  tiotropium (SPIRIVA HANDIHALER) 18 MCG inhalation capsule PLACE 1 CAPSULE (18 MCG TOTAL) INTO INHALER AND INHALE DAILY. 09/16/15   Mary-Margaret Daphine Deutscher, [name]  torsemide (DEMADEX) 20 MG tablet Take 1 tablet (20 mg total) by mouth daily. Take one tablet by mouth if weight is 160 pounds or above 10/24/15   Tammy Eckard, PHARMD  warfarin (COUMADIN) 5 MG tablet Take 0.5-1 tablets (2.5-5 mg total) by mouth See admin instructions. Takes 1 tablet (5mg ) every Tuesdays and Fridays and 1/2 tablet (2.5 mg) all other days. 09/16/15   Mary-Margaret Daphine Deutscher, [name]   BP 128/89 mmHg  Pulse 70  Temp(Src) 97 F (36.1 C) (Oral)  Resp 18  Ht 5' (1.524 m)  Wt 165 lb 6.4 oz (75.025 kg)  BMI 32.30 kg/m2  SpO2 97%  LMP 03/27/1983 Physical Exam  Constitutional: She  is oriented to person, place, and time. She appears well-developed.  Elderly, frail  HENT:  Head: Normocephalic and atraumatic.  Right Ear: External ear normal.  Left Ear: External ear normal.  Eyes: Conjunctivae and EOM are normal. Pupils are equal, round, and reactive to light.  Neck: Normal range of motion and phonation normal. Neck supple.  Cardiovascular: Normal rate, regular rhythm and normal heart sounds.   Pulmonary/Chest: Effort normal and breath sounds normal. No respiratory distress. She has no wheezes. She has no rales. She exhibits no bony tenderness.  Abdominal: Soft. There is no tenderness.  Musculoskeletal: Normal range of motion. She exhibits edema (bilateral  lower extremity, right greater than left with mild erythema of the right lower leg.).  Neurological: She is alert and oriented to person, place, and time. No cranial nerve deficit or sensory deficit. She exhibits normal muscle tone. Coordination normal.  Skin: Skin is warm, dry and intact. There is erythema.  Scattered excoriations and lower legs bilaterally.  Psychiatric: She has a normal mood and affect. Her behavior is normal. Judgment and thought content normal.  Nursing note and vitals reviewed.   ED Course  Procedures (including critical care time)  Medications - No data to display  Patient Vitals for the past 24 hrs:  BP Temp Temp src Pulse Resp SpO2 Height Weight  10/26/15 1347 128/89 mmHg 97 F (36.1 C) Oral 70 18 97 % 5' (1.524 m) 165 lb 6.4 oz (75.025 kg)    5:43 PM Reevaluation with update and discussion. After initial assessment and treatment, an updated evaluation reveals findings discussed with patient and family members, all questions were answered.Mancel Bale L    Labs Review Labs Reviewed  BASIC METABOLIC PANEL - Abnormal; Notable for the following:    Sodium 134 (*)    Chloride 92 (*)    Glucose, Bld 114 (*)    Creatinine, Ser 1.66 (*)    Calcium 8.5 (*)    GFR calc non Af Amer 28  (*)    GFR calc Af Amer 33 (*)    All other components within normal limits  CBC - Abnormal; Notable for the following:    WBC 13.8 (*)    Platelets 99 (*)    All other components within normal limits  TROPONIN I    BUN  Date Value Ref Range Status  10/26/2015 16 6 - 20 mg/dL Final  69/62/9528 34* 8 - 27 mg/dL Final  41/32/4401 33* 6 - 20 mg/dL Final  02/72/5366 43* 6 - 20 mg/dL Final  44/02/4741 29* 6 - 20 mg/dL Final  59/56/3875 27 8 - 27 mg/dL Final  64/33/2951 15 8 - 27 mg/dL Final  88/41/6606 21 8 - 27 mg/dL Final   CREAT  Date Value Ref Range Status  06/25/2013 1.13* 0.50 - 1.10 mg/dL Final  30/16/0109 3.23 0.50 - 1.10 mg/dL Final   CREATININE, SER  Date Value Ref Range Status  10/26/2015 1.66* 0.44 - 1.00 mg/dL Final  55/73/2202 5.42* 0.57 - 1.00 mg/dL Final  70/62/3762 8.31* 0.44 - 1.00 mg/dL Final  51/76/1607 3.71* 0.44 - 1.00 mg/dL Final      Imaging Review Dg Chest 2 View  10/26/2015  CLINICAL DATA:  right leg edema.  Fluid leaking from foot for 1 day EXAM: CHEST  2 VIEW COMPARISON:  10/09/2015 FINDINGS: There is moderate cardiac enlargement. Aortic atherosclerosis noted. Atelectasis is noted in the right base. There is a small right pleural effusion identified. No interstitial edema. Multi level degenerative disc disease identified within the thoracic spine. IMPRESSION: 1. Cardiac enlargement, aortic atherosclerosis and small right pleural effusion. 2. Right upper lobe atelectasis. Electronically Signed   By: Signa Kell M.D.   On: 10/26/2015 14:49   I have personally reviewed and evaluated these images and lab results as part of my medical decision-making.   EKG Interpretation None      MDM   Final diagnoses:  Peripheral edema    Lower extremity swelling, right greater than left, with history of diastolic cardiac dysfunction. Ejection fraction 50-55%, in August 2016. Her weight is less than when she was discharged from the hospital 10/13/2015.  Edema  is likely nutritional, and does not represent an exacerbation of acute congestive heart failure.  Nursing Notes Reviewed/ Care Coordinated Applicable Imaging Reviewed Interpretation of Laboratory Data incorporated into ED treatment  The patient appears reasonably screened and/or stabilized for discharge and I doubt any other medical condition or other [facility] requiring further screening, evaluation, or treatment in the ED at this time prior to discharge.  Plan: Home Medications- increase Torsemide to 2-3 per day until seeing PCP; Home Treatments- elevate feet; return here if the recommended treatment, does not improve the symptoms; Recommended follow up- PCP 3 days   Mancel BaleElliott Rayhana Slider, MD 10/26/15 1747

## 2015-10-26 NOTE — Discharge Instructions (Signed)
Continue taking 2-3 torsemide pills each day, until you follow up with your doctor. Elevate your feet above your heart, as much as possible. Be careful about your salt intake.   Peripheral Edema You have swelling in your legs (peripheral edema). This swelling is due to excess accumulation of salt and water in your body. Edema may be a sign of heart, kidney or liver disease, or a side effect of a medication. It may also be due to problems in the leg veins. Elevating your legs and using special support stockings may be very helpful, if the cause of the swelling is due to poor venous circulation. Avoid long periods of standing, whatever the cause. Treatment of edema depends on identifying the cause. Chips, pretzels, pickles and other salty foods should be avoided. Restricting salt in your diet is almost always needed. Water pills (diuretics) are often used to remove the excess salt and water from your body via urine. These medicines prevent the kidney from reabsorbing sodium. This increases urine flow. Diuretic treatment may also result in lowering of potassium levels in your body. Potassium supplements may be needed if you have to use diuretics daily. Daily weights can help you keep track of your progress in clearing your edema. You should call your caregiver for follow up care as recommended. SEEK IMMEDIATE MEDICAL CARE IF:   You have increased swelling, pain, redness, or heat in your legs.  You develop shortness of breath, especially when lying down.  You develop chest or abdominal pain, weakness, or fainting.  You have a fever.   This information is not intended to replace advice given to you by your health care provider. Make sure you discuss any questions you have with your health care provider.   Document Released: 01/20/2005 Document Revised: 03/06/2012 Document Reviewed: 06/25/2015 Elsevier Interactive Patient Education [employer]2016 Elsevier Inc.

## 2015-10-26 NOTE — ED Notes (Signed)
Pt sent by home healthcare RN for increased LE edema, despite an increase in lasix.  Pt denies chest pain or increased sob.l

## 2015-10-27 ENCOUNTER — Telehealth: Payer: Self-pay | Admitting: Nurse Practitioner

## 2015-10-27 DIAGNOSIS — Z9181 History of falling: Secondary | ICD-10-CM | POA: Diagnosis not present

## 2015-10-27 DIAGNOSIS — N189 Chronic kidney disease, unspecified: Secondary | ICD-10-CM | POA: Diagnosis not present

## 2015-10-27 DIAGNOSIS — M810 Age-related osteoporosis without current pathological fracture: Secondary | ICD-10-CM | POA: Diagnosis not present

## 2015-10-27 DIAGNOSIS — J449 Chronic obstructive pulmonary disease, unspecified: Secondary | ICD-10-CM | POA: Diagnosis not present

## 2015-10-27 DIAGNOSIS — M545 Low back pain: Secondary | ICD-10-CM | POA: Diagnosis not present

## 2015-10-27 DIAGNOSIS — E785 Hyperlipidemia, unspecified: Secondary | ICD-10-CM | POA: Diagnosis not present

## 2015-10-27 DIAGNOSIS — J188 Other pneumonia, unspecified organism: Secondary | ICD-10-CM | POA: Diagnosis not present

## 2015-10-27 DIAGNOSIS — I509 Heart failure, unspecified: Secondary | ICD-10-CM | POA: Diagnosis not present

## 2015-10-27 DIAGNOSIS — I130 Hypertensive heart and chronic kidney disease with heart failure and stage 1 through stage 4 chronic kidney disease, or unspecified chronic kidney disease: Secondary | ICD-10-CM | POA: Diagnosis not present

## 2015-10-27 DIAGNOSIS — Z9981 Dependence on supplemental oxygen: Secondary | ICD-10-CM | POA: Diagnosis not present

## 2015-10-27 DIAGNOSIS — I5032 Chronic diastolic (congestive) heart failure: Secondary | ICD-10-CM | POA: Diagnosis not present

## 2015-10-27 DIAGNOSIS — I2510 Atherosclerotic heart disease of native coronary artery without angina pectoris: Secondary | ICD-10-CM | POA: Diagnosis not present

## 2015-10-27 DIAGNOSIS — I482 Chronic atrial fibrillation: Secondary | ICD-10-CM | POA: Diagnosis not present

## 2015-10-27 DIAGNOSIS — M1990 Unspecified osteoarthritis, unspecified site: Secondary | ICD-10-CM | POA: Diagnosis not present

## 2015-10-27 NOTE — Telephone Encounter (Signed)
Pt given appt with MMM 11/3 at 8:30.

## 2015-10-28 ENCOUNTER — Other Ambulatory Visit: Payer: Self-pay | Admitting: Licensed Clinical Social Worker

## 2015-10-28 ENCOUNTER — Encounter: Payer: Self-pay | Admitting: Licensed Clinical Social Worker

## 2015-10-28 DIAGNOSIS — Z9181 History of falling: Secondary | ICD-10-CM | POA: Diagnosis not present

## 2015-10-28 DIAGNOSIS — I482 Chronic atrial fibrillation: Secondary | ICD-10-CM | POA: Diagnosis not present

## 2015-10-28 DIAGNOSIS — Z9981 Dependence on supplemental oxygen: Secondary | ICD-10-CM | POA: Diagnosis not present

## 2015-10-28 DIAGNOSIS — M1990 Unspecified osteoarthritis, unspecified site: Secondary | ICD-10-CM | POA: Diagnosis not present

## 2015-10-28 DIAGNOSIS — I130 Hypertensive heart and chronic kidney disease with heart failure and stage 1 through stage 4 chronic kidney disease, or unspecified chronic kidney disease: Secondary | ICD-10-CM | POA: Diagnosis not present

## 2015-10-28 DIAGNOSIS — I2510 Atherosclerotic heart disease of native coronary artery without angina pectoris: Secondary | ICD-10-CM | POA: Diagnosis not present

## 2015-10-28 DIAGNOSIS — N189 Chronic kidney disease, unspecified: Secondary | ICD-10-CM | POA: Diagnosis not present

## 2015-10-28 DIAGNOSIS — E785 Hyperlipidemia, unspecified: Secondary | ICD-10-CM | POA: Diagnosis not present

## 2015-10-28 DIAGNOSIS — J449 Chronic obstructive pulmonary disease, unspecified: Secondary | ICD-10-CM | POA: Diagnosis not present

## 2015-10-28 DIAGNOSIS — I5032 Chronic diastolic (congestive) heart failure: Secondary | ICD-10-CM | POA: Diagnosis not present

## 2015-10-28 NOTE — Patient Outreach (Addendum)
[facility]) Care Management  [facility] Social Work  10/28/2015  [name] 20-Mar-1933 [id number]  Subjective:    Objective:   Current Medications:  Current Outpatient Prescriptions  Medication Sig Dispense Refill  . alendronate (FOSAMAX) 70 MG tablet Take 1 tablet (70 mg total) by mouth once a week. Take with a full glass of water on an empty stomach. 12 tablet 1  . atorvastatin (LIPITOR) 40 MG tablet TAKE 1 TABLET (40 MG TOTAL) BY MOUTH DAILY. 90 tablet 1  . busPIRone (BUSPAR) 7.5 MG tablet Take 1 tablet (7.5 mg total) by mouth 2 (two) times daily. . 60 tablet 5  . citalopram (CELEXA) 20 MG tablet Take 1 tablet (20 mg total) by mouth daily. 30 tablet 1  . dexlansoprazole (DEXILANT) 60 MG capsule TAKE 1 CAPSULE (60 MG TOTAL) BY MOUTH DAILY. 90 capsule 1  . diltiazem (CARDIZEM CD) 360 MG 24 hr capsule Take 1 capsule (360 mg total) by mouth daily. 30 capsule 1  . feeding supplement, ENSURE ENLIVE, (ENSURE ENLIVE) LIQD Take 237 mLs by mouth daily. (Patient not taking: Reported on 10/17/2015) 60 Bottle 0  . fluticasone (FLONASE) 50 MCG/ACT nasal spray Place 2 sprays into both nostrils daily. 16 g 2  . Fluticasone-Salmeterol (ADVAIR DISKUS) 250-50 MCG/DOSE AEPB Inhale 1 puff into the lungs 2 (two) times daily. 60 each 5  . glucose blood (ONETOUCH VERIO) test strip Test 1x per day and prn   Dx- e11.9 100 each 12  . HYDROcodone-acetaminophen (NORCO/VICODIN) 5-325 MG per tablet Take 1 tablet by mouth 2 (two) times daily as needed for moderate pain. (Patient taking differently: Take 1 tablet by mouth every 6 (six) hours as needed for moderate pain. ) 60 tablet 0  . Lancets (ONETOUCH ULTRASOFT) lancets Use as instructed 100 each 12  . loratadine (CLARITIN) 10 MG tablet Take 1 tablet (10 mg total) by mouth daily. 30 tablet 0  . metFORMIN (GLUMETZA) 500 MG (MOD) 24 hr tablet Take 500 mg by mouth daily with breakfast.    . metoprolol (LOPRESSOR) 100 MG tablet Take 1 tablet (100 mg  total) by mouth 2 (two) times daily. 60 tablet 3  . nitroGLYCERIN (NITROSTAT) 0.4 MG SL tablet Place 1 tablet (0.4 mg total) under the tongue every 5 (five) minutes as needed for chest pain. 25 tablet 6  . tiotropium (SPIRIVA HANDIHALER) 18 MCG inhalation capsule PLACE 1 CAPSULE (18 MCG TOTAL) INTO INHALER AND INHALE DAILY. 90 capsule 1  . torsemide (DEMADEX) 20 MG tablet Take 1 tablet (20 mg total) by mouth daily. Take one tablet by mouth if weight is 160 pounds or above 90 tablet 1  . warfarin (COUMADIN) 5 MG tablet Take 0.5-1 tablets (2.5-5 mg total) by mouth See admin instructions. Takes 1 tablet (42m) every Tuesdays and Fridays and 1/2 tablet (2.5 mg) all other days. 60 tablet 5  . [DISCONTINUED] diltiazem (TIAZAC) 180 MG 24 hr capsule Take 1 capsule (180 mg total) by mouth daily. 90 capsule 1   No current facility-administered medications for this visit.    Functional Status:  In your present state of health, do you have any difficulty performing the following activities: 10/23/2015 10/17/2015  Hearing? N Y  Vision? N N  Difficulty concentrating or making decisions? YTempie Donning Walking or climbing stairs? Y Y  Dressing or bathing? Y N  Doing errands, shopping? YTempie Donning Preparing Food and eating ? Y Y  Using the Toilet? N N  In the past six  months, have you accidently leaked urine? N N  Do you have problems with loss of bowel control? N N  Managing your Medications? Y Y  Managing your Finances? Tempie Donning  Housekeeping or managing your Housekeeping? Tempie Donning    Fall/Depression Screening:  PHQ 2/9 Scores 10/23/2015 10/17/2015 09/16/2015 08/11/2015 06/16/2015 02/19/2015 10/28/2014  PHQ - 2 Score _0 0 0 0  PHQ- 9 Score _1 - - -    Assessment:   CSW met with client on 10/28/15 at home of client for a routine home visit. Family member, Annamarie Dawley also present for [place] meeting with client.  Client uses oxygen via nasal canula continuously at 2 liters.  She uses walker to help her ambulate. She  said she fatigues easily.  Client and CSW spoke of roof repair needs.  Jerrye Beavers said he had checked with local agency in [facility] about possible clientt assist with Designer, fashion/clothing. He has to obtain  a document needed to contiue with application process for agency that is considering possibly helping client.  Agency he is communicating with is [place] program.([phone], ext. [phone]). Contact person is  [employer].  Jerrye Beavers said he has completed client application with that agency for roof repair request for client.  CSW has also contacted [facility] regarding client and has placed client on waiting list for possible home repairs (small home repair jobs) with that agency.  Client has her prescribed medications and is taking medications as prescribed.  Client said she will visit Chevis Pretty, NP at [facility] on 10/30/15 for appointment.  Client has adequate food supply.  She said she is sleeping well. CSW and client spoke of advanced directives prepartation. CSW informed client about [place] document and about Living Will document.  CSW gave client several booklets regarding advanced directives information  Client said she would review advanced directives booklets.  CSW encouraged client to call CSW as needed at [phone] to further discuss questions she might have regarding advanced directives or advanced directives preparation. CSW gave client [facility] CSW card and encouraged client to call CSW as needed to further discuss social work needs of client.  CSW spoke with client about counseling support for client.  Client said she was not interested in going to any counseling agencies in the area. CSW offered to provide possible counseling support for client as needed.  Client did not agree to counseling support with CSW.  CSW suggested that client and CSW could speak further about possible  counseling support of client with CSW.    Plan:  Client to communicate with RN Alexander Bergeron, as needed,  to discuss nursing needs of client. CSW to call client in  three weeks to discuss needs of client at that time. CSW to speak further with client regarding counseling support for client with CSW during next CSW phone call with client in three weeks. CSW informed RN Jacqlyn Larsen of above information on 10/28/15.  Norva Riffle.Forrest MSW, LCSW Licensed Clinical Social Worker [facility] Care Management [phone]

## 2015-10-29 ENCOUNTER — Ambulatory Visit (INDEPENDENT_AMBULATORY_CARE_PROVIDER_SITE_OTHER): Payer: Medicare Other | Admitting: Pharmacist

## 2015-10-29 ENCOUNTER — Telehealth: Payer: Self-pay | Admitting: Pharmacist

## 2015-10-29 DIAGNOSIS — I482 Chronic atrial fibrillation: Secondary | ICD-10-CM | POA: Diagnosis not present

## 2015-10-29 DIAGNOSIS — M1990 Unspecified osteoarthritis, unspecified site: Secondary | ICD-10-CM | POA: Diagnosis not present

## 2015-10-29 DIAGNOSIS — J449 Chronic obstructive pulmonary disease, unspecified: Secondary | ICD-10-CM | POA: Diagnosis not present

## 2015-10-29 DIAGNOSIS — I4891 Unspecified atrial fibrillation: Secondary | ICD-10-CM

## 2015-10-29 DIAGNOSIS — I5032 Chronic diastolic (congestive) heart failure: Secondary | ICD-10-CM | POA: Diagnosis not present

## 2015-10-29 DIAGNOSIS — Z9181 History of falling: Secondary | ICD-10-CM | POA: Diagnosis not present

## 2015-10-29 DIAGNOSIS — N189 Chronic kidney disease, unspecified: Secondary | ICD-10-CM | POA: Diagnosis not present

## 2015-10-29 DIAGNOSIS — I130 Hypertensive heart and chronic kidney disease with heart failure and stage 1 through stage 4 chronic kidney disease, or unspecified chronic kidney disease: Secondary | ICD-10-CM | POA: Diagnosis not present

## 2015-10-29 DIAGNOSIS — E785 Hyperlipidemia, unspecified: Secondary | ICD-10-CM | POA: Diagnosis not present

## 2015-10-29 DIAGNOSIS — I2510 Atherosclerotic heart disease of native coronary artery without angina pectoris: Secondary | ICD-10-CM | POA: Diagnosis not present

## 2015-10-29 DIAGNOSIS — Z9981 Dependence on supplemental oxygen: Secondary | ICD-10-CM | POA: Diagnosis not present

## 2015-10-29 LAB — POCT INR: INR: 2.3

## 2015-10-29 NOTE — Telephone Encounter (Signed)
INR was 2.3 today - patient is to continue current warfarin dose of 5mg  on Tuesday, Thursday and Saturday.  2.5mg  all other days. Recheck INR in 1 week.   [name] and patient advised of above recommendations.

## 2015-10-30 ENCOUNTER — Encounter: Payer: Self-pay | Admitting: Nurse Practitioner

## 2015-10-30 ENCOUNTER — Ambulatory Visit (INDEPENDENT_AMBULATORY_CARE_PROVIDER_SITE_OTHER): Payer: Medicare Other | Admitting: Nurse Practitioner

## 2015-10-30 ENCOUNTER — Other Ambulatory Visit: Payer: Self-pay | Admitting: *Deleted

## 2015-10-30 VITALS — BP 121/75 | HR 98 | Temp 96.8°F | Ht 60.0 in | Wt 159.0 lb

## 2015-10-30 DIAGNOSIS — Z09 Encounter for follow-up examination after completed treatment for conditions other than malignant neoplasm: Secondary | ICD-10-CM

## 2015-10-30 DIAGNOSIS — J449 Chronic obstructive pulmonary disease, unspecified: Secondary | ICD-10-CM | POA: Diagnosis not present

## 2015-10-30 DIAGNOSIS — I130 Hypertensive heart and chronic kidney disease with heart failure and stage 1 through stage 4 chronic kidney disease, or unspecified chronic kidney disease: Secondary | ICD-10-CM | POA: Diagnosis not present

## 2015-10-30 DIAGNOSIS — I482 Chronic atrial fibrillation: Secondary | ICD-10-CM | POA: Diagnosis not present

## 2015-10-30 DIAGNOSIS — L02419 Cutaneous abscess of limb, unspecified: Secondary | ICD-10-CM

## 2015-10-30 DIAGNOSIS — I2510 Atherosclerotic heart disease of native coronary artery without angina pectoris: Secondary | ICD-10-CM | POA: Diagnosis not present

## 2015-10-30 DIAGNOSIS — Z9181 History of falling: Secondary | ICD-10-CM | POA: Diagnosis not present

## 2015-10-30 DIAGNOSIS — Z9981 Dependence on supplemental oxygen: Secondary | ICD-10-CM | POA: Diagnosis not present

## 2015-10-30 DIAGNOSIS — I5032 Chronic diastolic (congestive) heart failure: Secondary | ICD-10-CM | POA: Diagnosis not present

## 2015-10-30 DIAGNOSIS — N189 Chronic kidney disease, unspecified: Secondary | ICD-10-CM | POA: Diagnosis not present

## 2015-10-30 DIAGNOSIS — E785 Hyperlipidemia, unspecified: Secondary | ICD-10-CM | POA: Diagnosis not present

## 2015-10-30 DIAGNOSIS — M1990 Unspecified osteoarthritis, unspecified site: Secondary | ICD-10-CM | POA: Diagnosis not present

## 2015-10-30 DIAGNOSIS — L03119 Cellulitis of unspecified part of limb: Secondary | ICD-10-CM | POA: Diagnosis not present

## 2015-10-30 MED ORDER — CIPROFLOXACIN HCL 500 MG PO TABS: 500.0000 mg | ORAL_TABLET | Freq: Two times a day (BID) | ORAL | Status: DC

## 2015-10-30 NOTE — Patient Instructions (Signed)

## 2015-10-30 NOTE — Patient Outreach (Signed)
10/30/15-Telephone call to Katie RodriguezAmanda Melendez (daughter in law) for transition of care week 3, HIPAA verified, [name] states pt did get hospital "and this has been a big help", reports pt did get lancets "and they were wrong ones and we opened them and can't take them back"  [name] states home health has been checking CBG several times weekly with readings 100-120's range, [name] says "we've got so much going on right now, I"m not worried about the lancets, we'll eventually get some, I can actually prick her finger with these if I need to" RN CM offered to assist if needed and caregiver declined, RN CM ask caregiver to call MD office or take lancet device by MD office so correct lancets can be called in.  [name] states patient's legs swelled and pt went to ED on Sunday, now home health is applying unna boot to right lower extremity.  Weight today 157 pounds and [facility]. pt has lost some weight and feels it is fluid weight as well as pt has fair appetite. Pt has all medications and taking as prescribed.  THN CM Care Plan Problem One        Most Recent Value   Care Plan Problem One  Knowledge deficit related to management of CHF   Role Documenting the Problem One  Care Management Coordinator   Care Plan for Problem One  Active   THN CM Short Term Goal #1 (0-30 days)  pt and caregiver will verbalize CHF zones/ action plan within 30 days.   THN CM Short Term Goal #1 Start Date  10/23/15   Interventions for Short Term Goal #1  RN CM eviewed resources to call and when, reviewed CHF action plan/zones    [facility] Care Plan Problem Two        Most Recent Value   Care Plan Problem Two  knowledge deficit related to COPD/ pneumonia   Role Documenting the Problem Two  Care Management Coordinator   Care Plan for Problem Two  Active   Interventions for Problem Two Long Term Goal   RN CM reviewed with caregiver the importance of taking all medications, inhalers as prescribed  and calling MD early for change in health status, symptoms, reviewed COPD action plan.   THN Long Term Goal (31-90) days  pt will have no readmissions related to COPD/ pneumonia within 90 days   THN Long Term Goal Start Date  10/17/15   THN CM Short Term Goal #1 (0-30 days)  pt will verbalize COPD action plan/ zones within 30 days.   THN CM Short Term Goal #1 Start Date  10/17/15   Interventions for Short Term Goal #2   RN CM reviewed COPD action plan and importance of knowing how you are feeling each day., gave pt copy of COPD zones/action plan and ask pt and caregiver to review     PLAN Continue weekly transition of care calls  Katie ShowsJulie Melendez [facility] HospitalRNC, BSN [facility] Coordinator [phone]

## 2015-10-30 NOTE — Progress Notes (Signed)
   Subjective:    Patient ID: [name], female    DOB: March 11, 1933, 81 y.o.   MRN: [id number]  HPI Patient here today for hospital follow up- Was in the hospital with fluid retention and CHF- Since cmin home she has been on demadex 2 in am and 1 in afternoon. Daughter says taht she has been much better since she has been home- still has some swelling during the day but hoes away at night. Needs compression hose. Home health nurse came to see her yesterday and wrapped her right leg with ace wrap- says taht she has lesion on it that needs to be looked at.    Review of Systems  Constitutional: Negative.   HENT: Negative.   Respiratory: Negative.   Cardiovascular: Negative.   Gastrointestinal: Negative.   Genitourinary: Negative.   Neurological: Negative.   Psychiatric/Behavioral: Negative.   All other systems reviewed and are negative.      Objective:   Physical Exam  Constitutional: She is oriented to person, place, and time. She appears well-developed and well-nourished.  Cardiovascular: Normal rate, regular rhythm and normal heart sounds.   Pulmonary/Chest: Effort normal and breath sounds normal.  Musculoskeletal: She exhibits edema (2+ edema in right lower leg with erythema).  Neurological: She is alert and oriented to person, place, and time.  Skin: Skin is warm and dry.  Psychiatric: She has a normal mood and affect. Her behavior is normal. Judgment and thought content normal.   BP 121/75 mmHg  Pulse 98  Temp(Src) 96.8 F (36 C) (Oral)  Ht 5' (1.524 m)  Wt 159 lb (72.122 kg)  BMI 31.05 kg/m2  LMP 03/27/1983        Assessment & Plan:  1. Hospital discharge follow-up Hospital records reviewed  2. Cellulitis and abscess of lower extremity Meds ordered this encounter  Medications  . ciprofloxacin (CIPRO) 500 MG tablet    Sig: Take 1 tablet (500 mg total) by mouth 2 (two) times daily.    Dispense:  20 tablet    Refill:  0    Order Specific Question:   Supervising Provider    Answer:  Ernestina PennaMOORE, DONALD W [[id number]]    elevate legs when sitting Compression hose on left leg Continue demadex 2 in am and 1 in afternoon RTO Monday follow up - Apply unna boot; Standing - Apply unna boot  Mary-Margaret Daphine DeutscherMartin, [name]

## 2015-10-31 DIAGNOSIS — N189 Chronic kidney disease, unspecified: Secondary | ICD-10-CM | POA: Diagnosis not present

## 2015-10-31 DIAGNOSIS — I2510 Atherosclerotic heart disease of native coronary artery without angina pectoris: Secondary | ICD-10-CM | POA: Diagnosis not present

## 2015-10-31 DIAGNOSIS — I5032 Chronic diastolic (congestive) heart failure: Secondary | ICD-10-CM | POA: Diagnosis not present

## 2015-10-31 DIAGNOSIS — J449 Chronic obstructive pulmonary disease, unspecified: Secondary | ICD-10-CM | POA: Diagnosis not present

## 2015-10-31 DIAGNOSIS — E785 Hyperlipidemia, unspecified: Secondary | ICD-10-CM | POA: Diagnosis not present

## 2015-10-31 DIAGNOSIS — Z9981 Dependence on supplemental oxygen: Secondary | ICD-10-CM | POA: Diagnosis not present

## 2015-10-31 DIAGNOSIS — I482 Chronic atrial fibrillation: Secondary | ICD-10-CM | POA: Diagnosis not present

## 2015-10-31 DIAGNOSIS — I130 Hypertensive heart and chronic kidney disease with heart failure and stage 1 through stage 4 chronic kidney disease, or unspecified chronic kidney disease: Secondary | ICD-10-CM | POA: Diagnosis not present

## 2015-10-31 DIAGNOSIS — Z9181 History of falling: Secondary | ICD-10-CM | POA: Diagnosis not present

## 2015-10-31 DIAGNOSIS — M1990 Unspecified osteoarthritis, unspecified site: Secondary | ICD-10-CM | POA: Diagnosis not present

## 2015-11-03 ENCOUNTER — Encounter: Payer: Self-pay | Admitting: Family Medicine

## 2015-11-03 ENCOUNTER — Encounter: Payer: Self-pay | Admitting: Licensed Clinical Social Worker

## 2015-11-03 ENCOUNTER — Ambulatory Visit (INDEPENDENT_AMBULATORY_CARE_PROVIDER_SITE_OTHER): Payer: Medicare Other | Admitting: Family Medicine

## 2015-11-03 VITALS — BP 119/76 | HR 75 | Temp 98.0°F | Ht 60.0 in | Wt 156.4 lb

## 2015-11-03 DIAGNOSIS — S81801D Unspecified open wound, right lower leg, subsequent encounter: Secondary | ICD-10-CM

## 2015-11-03 NOTE — Progress Notes (Signed)
BP 119/76 mmHg  Pulse 75  Temp(Src) 98 F (36.7 C) (Oral)  Ht 5' (1.524 m)  Wt 156 lb 6.4 oz (70.943 kg)  BMI 30.54 kg/m2  LMP 03/27/1983   Subjective:    Patient ID: [name], female    DOB: 1933/11/10, 81 y.o.   MRN: [id number]  HPI: [name] is a 81 y.o. female presenting on 11/03/2015 for Followup right leg wound   HPI Wound recheck She has significant lower extremity edema especially worse in her right leg. She was seen last week or my colleagues and they put an Radio broadcast assistant on her because she also has early stage III ulceration on her right lateral lower leg. He also started on antibiotic because of signs of infection at that time. Patient denies any fevers or chills or purulent drainage from the wound site. The wound has been covered with Unna boot all week. She feels like her swelling has come down significantly on both sides especially since using the TED hose on the left.  Relevant past medical, surgical, family and social history reviewed and updated as indicated. Interim medical history since our last visit reviewed. Allergies and medications reviewed and updated.  Review of Systems  Constitutional: Negative for fever and chills.  HENT: Negative for congestion, ear discharge and ear pain.   Eyes: Negative for redness and visual disturbance.  Respiratory: Negative for chest tightness and shortness of breath.   Cardiovascular: Positive for leg swelling. Negative for chest pain.  Genitourinary: Negative for dysuria and difficulty urinating.  Musculoskeletal: Negative for back pain and gait problem.  Skin: Positive for wound. Negative for rash.  Neurological: Negative for light-headedness and headaches.  Psychiatric/Behavioral: Negative for behavioral problems and agitation.  All other systems reviewed and are negative.   Per HPI unless specifically indicated above     Medication List       This list is accurate as of: 11/03/15  9:30 AM.  Always use your  most recent med list.               alendronate 70 MG tablet  Commonly known as:  FOSAMAX  Take 1 tablet (70 mg total) by mouth once a week. Take with a full glass of water on an empty stomach.     atorvastatin 40 MG tablet  Commonly known as:  LIPITOR  TAKE 1 TABLET (40 MG TOTAL) BY MOUTH DAILY.     busPIRone 7.5 MG tablet  Commonly known as:  BUSPAR  Take 1 tablet (7.5 mg total) by mouth 2 (two) times daily. .     ciprofloxacin 500 MG tablet  Commonly known as:  CIPRO  Take 1 tablet (500 mg total) by mouth 2 (two) times daily.     citalopram 20 MG tablet  Commonly known as:  CELEXA  Take 1 tablet (20 mg total) by mouth daily.     dexlansoprazole 60 MG capsule  Commonly known as:  DEXILANT  TAKE 1 CAPSULE (60 MG TOTAL) BY MOUTH DAILY.     diltiazem 360 MG 24 hr capsule  Commonly known as:  CARDIZEM CD  Take 1 capsule (360 mg total) by mouth daily.     feeding supplement (ENSURE ENLIVE) Liqd  Take 237 mLs by mouth daily.     fluticasone 50 MCG/ACT nasal spray  Commonly known as:  FLONASE  Place 2 sprays into both nostrils daily.     Fluticasone-Salmeterol 250-50 MCG/DOSE Aepb  Commonly known as:  ADVAIR DISKUS  Inhale 1 puff into the lungs 2 (two) times daily.     glucose blood test strip  Commonly known as:  ONETOUCH VERIO  Test 1x per day and prn   Dx- e11.9     HYDROcodone-acetaminophen 5-325 MG tablet  Commonly known as:  NORCO/VICODIN  Take 1 tablet by mouth 2 (two) times daily as needed for moderate pain.     loratadine 10 MG tablet  Commonly known as:  CLARITIN  Take 1 tablet (10 mg total) by mouth daily.     metFORMIN 500 MG (MOD) 24 hr tablet  Commonly known as:  GLUMETZA  Take 500 mg by mouth daily with breakfast.     metoprolol 100 MG tablet  Commonly known as:  LOPRESSOR  Take 1 tablet (100 mg total) by mouth 2 (two) times daily.     nitroGLYCERIN 0.4 MG SL tablet  Commonly known as:  NITROSTAT  Place 1 tablet (0.4 mg total) under the  tongue every 5 (five) minutes as needed for chest pain.     onetouch ultrasoft lancets  Use as instructed     tiotropium 18 MCG inhalation capsule  Commonly known as:  SPIRIVA HANDIHALER  PLACE 1 CAPSULE (18 MCG TOTAL) INTO INHALER AND INHALE DAILY.     torsemide 20 MG tablet  Commonly known as:  DEMADEX  Take 1 tablet (20 mg total) by mouth daily. Take one tablet by mouth if weight is 160 pounds or above     warfarin 5 MG tablet  Commonly known as:  COUMADIN  Take 0.5-1 tablets (2.5-5 mg total) by mouth See admin instructions. Takes 1 tablet (5mg ) every Tuesdays and Fridays and 1/2 tablet (2.5 mg) all other days.           Objective:    BP 119/76 mmHg  Pulse 75  Temp(Src) 98 F (36.7 C) (Oral)  Ht 5' (1.524 m)  Wt 156 lb 6.4 oz (70.943 kg)  BMI 30.54 kg/m2  LMP 03/27/1983  Wt Readings from Last 3 Encounters:  11/03/15 156 lb 6.4 oz (70.943 kg)  10/30/15 159 lb (72.122 kg)  10/26/15 165 lb 6.4 oz (75.025 kg)    Physical Exam  Constitutional: She is oriented to person, place, and time. She appears well-developed and well-nourished. No distress.  Eyes: Conjunctivae and EOM are normal. Pupils are equal, round, and reactive to light.  Cardiovascular: Normal rate, regular rhythm, normal heart sounds and intact distal pulses.   No murmur heard. Pulmonary/Chest: Effort normal and breath sounds normal. No respiratory distress. She has no wheezes.  Musculoskeletal: Normal range of motion. She exhibits edema (2+ pitting edema in right lower extremity, trace edema left lower extremity with compression TED hose on). She exhibits no tenderness.  Neurological: She is alert and oriented to person, place, and time. Coordination normal.  Skin: Skin is warm and dry. No rash noted. She is not diaphoretic.     Psychiatric: She has a normal mood and affect. Her behavior is normal.  Nursing note and vitals reviewed.   Results for orders placed or performed in visit on 10/29/15  POCT INR   Result Value Ref Range   INR 2.3    Wound care: DuoDERM was placed on the site over the open wound and then the leg was wrapped again with an Unna boot and compression. Return in one week    Assessment & Plan:   Problem List Items Addressed This Visit      Other   Leg wound,  right - Primary    Patient has a right leg wound that is about 2 cm in diameter on her right lateral lower leg. Wound does not appear to be infected today and no purulent drainage is noted. Return in one week for wound recheck appears to be improving          Follow up plan: Return in about 1 week (around 11/10/2015), or if symptoms worsen or fail to improve, for Recheck wound.  Arville Care, MD [facility] 11/03/2015, 9:30 AM

## 2015-11-04 ENCOUNTER — Telehealth: Payer: Self-pay | Admitting: *Deleted

## 2015-11-04 DIAGNOSIS — N189 Chronic kidney disease, unspecified: Secondary | ICD-10-CM | POA: Diagnosis not present

## 2015-11-04 DIAGNOSIS — M1990 Unspecified osteoarthritis, unspecified site: Secondary | ICD-10-CM | POA: Diagnosis not present

## 2015-11-04 DIAGNOSIS — I130 Hypertensive heart and chronic kidney disease with heart failure and stage 1 through stage 4 chronic kidney disease, or unspecified chronic kidney disease: Secondary | ICD-10-CM | POA: Diagnosis not present

## 2015-11-04 DIAGNOSIS — I5032 Chronic diastolic (congestive) heart failure: Secondary | ICD-10-CM | POA: Diagnosis not present

## 2015-11-04 DIAGNOSIS — I2510 Atherosclerotic heart disease of native coronary artery without angina pectoris: Secondary | ICD-10-CM | POA: Diagnosis not present

## 2015-11-04 DIAGNOSIS — M810 Age-related osteoporosis without current pathological fracture: Secondary | ICD-10-CM | POA: Diagnosis not present

## 2015-11-04 DIAGNOSIS — Z9181 History of falling: Secondary | ICD-10-CM | POA: Diagnosis not present

## 2015-11-04 DIAGNOSIS — J188 Other pneumonia, unspecified organism: Secondary | ICD-10-CM | POA: Diagnosis not present

## 2015-11-04 DIAGNOSIS — Z9981 Dependence on supplemental oxygen: Secondary | ICD-10-CM | POA: Diagnosis not present

## 2015-11-04 DIAGNOSIS — E785 Hyperlipidemia, unspecified: Secondary | ICD-10-CM | POA: Diagnosis not present

## 2015-11-04 DIAGNOSIS — J449 Chronic obstructive pulmonary disease, unspecified: Secondary | ICD-10-CM | POA: Diagnosis not present

## 2015-11-04 DIAGNOSIS — I482 Chronic atrial fibrillation: Secondary | ICD-10-CM | POA: Diagnosis not present

## 2015-11-04 LAB — POCT INR: INR: 3.2

## 2015-11-04 NOTE — Telephone Encounter (Signed)
INR 3.2  Takes 5mg  on tues, thur, and sat. 2.5mg  all other days

## 2015-11-05 ENCOUNTER — Other Ambulatory Visit: Payer: Self-pay | Admitting: Nurse Practitioner

## 2015-11-05 ENCOUNTER — Ambulatory Visit (INDEPENDENT_AMBULATORY_CARE_PROVIDER_SITE_OTHER): Payer: Medicare Other | Admitting: Pharmacist

## 2015-11-05 ENCOUNTER — Telehealth: Payer: Self-pay | Admitting: *Deleted

## 2015-11-05 DIAGNOSIS — J449 Chronic obstructive pulmonary disease, unspecified: Secondary | ICD-10-CM | POA: Diagnosis not present

## 2015-11-05 DIAGNOSIS — S81801A Unspecified open wound, right lower leg, initial encounter: Secondary | ICD-10-CM | POA: Insufficient documentation

## 2015-11-05 DIAGNOSIS — E785 Hyperlipidemia, unspecified: Secondary | ICD-10-CM | POA: Diagnosis not present

## 2015-11-05 DIAGNOSIS — Z9181 History of falling: Secondary | ICD-10-CM | POA: Diagnosis not present

## 2015-11-05 DIAGNOSIS — Z9981 Dependence on supplemental oxygen: Secondary | ICD-10-CM | POA: Diagnosis not present

## 2015-11-05 DIAGNOSIS — I482 Chronic atrial fibrillation: Secondary | ICD-10-CM | POA: Diagnosis not present

## 2015-11-05 DIAGNOSIS — I2510 Atherosclerotic heart disease of native coronary artery without angina pectoris: Secondary | ICD-10-CM | POA: Diagnosis not present

## 2015-11-05 DIAGNOSIS — I5032 Chronic diastolic (congestive) heart failure: Secondary | ICD-10-CM | POA: Diagnosis not present

## 2015-11-05 DIAGNOSIS — M1990 Unspecified osteoarthritis, unspecified site: Secondary | ICD-10-CM | POA: Diagnosis not present

## 2015-11-05 DIAGNOSIS — N189 Chronic kidney disease, unspecified: Secondary | ICD-10-CM | POA: Diagnosis not present

## 2015-11-05 DIAGNOSIS — I130 Hypertensive heart and chronic kidney disease with heart failure and stage 1 through stage 4 chronic kidney disease, or unspecified chronic kidney disease: Secondary | ICD-10-CM | POA: Diagnosis not present

## 2015-11-05 NOTE — Telephone Encounter (Signed)
Please let [facility] know that we do  Not want to do any wound care until see patient back on Monday

## 2015-11-05 NOTE — Assessment & Plan Note (Signed)
Patient has a right leg wound that is about 2 cm in diameter on her right lateral lower leg. Wound does not appear to be infected today and no purulent drainage is noted. Return in one week for wound recheck appears to be improving

## 2015-11-05 NOTE — Telephone Encounter (Signed)
Write how you want the order and it can be called in

## 2015-11-05 NOTE — Telephone Encounter (Signed)
[facility] could do her leg wound care with orders,if you would like them to, but they said you may want to monitor it weekly yourself?

## 2015-11-05 NOTE — Telephone Encounter (Signed)
Hold warfarin for 1 day , then continue current dose of 5mg  on tuesdays, thursdays and saturdays.  Take 2.5mg  all other days. When discussing patient with [name] she mentions that patient's weight is down and edema is much better.  However she states that patient is still SOB.  O2 sats dropped to 70's when patient ambulated.   Patient has appt here Monday 11/10/15 - I will forward this message to PCP for further review and recommendations as needed.

## 2015-11-05 NOTE — Telephone Encounter (Signed)
It is fine for them to do wound care- not sure how to do order

## 2015-11-06 DIAGNOSIS — I2510 Atherosclerotic heart disease of native coronary artery without angina pectoris: Secondary | ICD-10-CM | POA: Diagnosis not present

## 2015-11-06 DIAGNOSIS — E785 Hyperlipidemia, unspecified: Secondary | ICD-10-CM | POA: Diagnosis not present

## 2015-11-06 DIAGNOSIS — I482 Chronic atrial fibrillation: Secondary | ICD-10-CM | POA: Diagnosis not present

## 2015-11-06 DIAGNOSIS — N189 Chronic kidney disease, unspecified: Secondary | ICD-10-CM | POA: Diagnosis not present

## 2015-11-06 DIAGNOSIS — Z9181 History of falling: Secondary | ICD-10-CM | POA: Diagnosis not present

## 2015-11-06 DIAGNOSIS — I5032 Chronic diastolic (congestive) heart failure: Secondary | ICD-10-CM | POA: Diagnosis not present

## 2015-11-06 DIAGNOSIS — M1990 Unspecified osteoarthritis, unspecified site: Secondary | ICD-10-CM | POA: Diagnosis not present

## 2015-11-06 DIAGNOSIS — I130 Hypertensive heart and chronic kidney disease with heart failure and stage 1 through stage 4 chronic kidney disease, or unspecified chronic kidney disease: Secondary | ICD-10-CM | POA: Diagnosis not present

## 2015-11-06 DIAGNOSIS — Z9981 Dependence on supplemental oxygen: Secondary | ICD-10-CM | POA: Diagnosis not present

## 2015-11-06 DIAGNOSIS — J449 Chronic obstructive pulmonary disease, unspecified: Secondary | ICD-10-CM | POA: Diagnosis not present

## 2015-11-07 ENCOUNTER — Ambulatory Visit: Payer: Medicare Other | Admitting: *Deleted

## 2015-11-07 ENCOUNTER — Telehealth: Payer: Self-pay | Admitting: Pharmacist

## 2015-11-07 ENCOUNTER — Other Ambulatory Visit: Payer: Self-pay | Admitting: *Deleted

## 2015-11-07 DIAGNOSIS — Z9981 Dependence on supplemental oxygen: Secondary | ICD-10-CM | POA: Diagnosis not present

## 2015-11-07 DIAGNOSIS — I482 Chronic atrial fibrillation: Secondary | ICD-10-CM | POA: Diagnosis not present

## 2015-11-07 DIAGNOSIS — Z9181 History of falling: Secondary | ICD-10-CM | POA: Diagnosis not present

## 2015-11-07 DIAGNOSIS — J449 Chronic obstructive pulmonary disease, unspecified: Secondary | ICD-10-CM | POA: Diagnosis not present

## 2015-11-07 DIAGNOSIS — M1990 Unspecified osteoarthritis, unspecified site: Secondary | ICD-10-CM | POA: Diagnosis not present

## 2015-11-07 DIAGNOSIS — I5032 Chronic diastolic (congestive) heart failure: Secondary | ICD-10-CM | POA: Diagnosis not present

## 2015-11-07 DIAGNOSIS — I130 Hypertensive heart and chronic kidney disease with heart failure and stage 1 through stage 4 chronic kidney disease, or unspecified chronic kidney disease: Secondary | ICD-10-CM | POA: Diagnosis not present

## 2015-11-07 DIAGNOSIS — E785 Hyperlipidemia, unspecified: Secondary | ICD-10-CM | POA: Diagnosis not present

## 2015-11-07 DIAGNOSIS — I2510 Atherosclerotic heart disease of native coronary artery without angina pectoris: Secondary | ICD-10-CM | POA: Diagnosis not present

## 2015-11-07 DIAGNOSIS — N189 Chronic kidney disease, unspecified: Secondary | ICD-10-CM | POA: Diagnosis not present

## 2015-11-07 NOTE — Telephone Encounter (Signed)
Current warfarin dose is 5mg  on tuesdays, thursdays and saturdays. Take 2.5mg  all other days.  Above dose communicated with Marchelle FolksAmanda

## 2015-11-07 NOTE — Patient Outreach (Signed)
11/07/15- Telephone call for transition of care week 4, no answer to home telephone and no option to leave voicemail.  PLAN See pt for scheduled home visit next week 11/14/15.  [name] [facility] East Norwegian StreetRNC, BSN [facility] Community Care Coordinator [phone]

## 2015-11-08 DIAGNOSIS — J449 Chronic obstructive pulmonary disease, unspecified: Secondary | ICD-10-CM | POA: Diagnosis not present

## 2015-11-10 ENCOUNTER — Ambulatory Visit (INDEPENDENT_AMBULATORY_CARE_PROVIDER_SITE_OTHER): Payer: Medicare Other | Admitting: Nurse Practitioner

## 2015-11-10 ENCOUNTER — Encounter: Payer: Self-pay | Admitting: Nurse Practitioner

## 2015-11-10 VITALS — BP 85/59 | HR 63 | Temp 97.1°F | Ht 60.0 in | Wt 156.0 lb

## 2015-11-10 DIAGNOSIS — L97919 Non-pressure chronic ulcer of unspecified part of right lower leg with unspecified severity: Secondary | ICD-10-CM

## 2015-11-10 DIAGNOSIS — I482 Chronic atrial fibrillation, unspecified: Secondary | ICD-10-CM

## 2015-11-10 LAB — POCT INR: INR: 3.6

## 2015-11-10 NOTE — Patient Instructions (Signed)
Anticoagulation Dose Instructions as of 11/10/2015      Katie SmilesSun Mon Tue Wed Thu Fri Sat   New Dose 2.5 mg 2.5 mg 5 mg 2.5 mg 5 mg 2.5 mg 5 mg    Description        Hold warfarin for 2 day , then continue current dose of 5mg  on tuesdays, thursdays and saturdays.  Take 2.5mg  all other days.      Recheck in 2 weeks

## 2015-11-10 NOTE — Progress Notes (Signed)
   Subjective:    Patient ID: [name], female    DOB: [birth year], 81 y.o.   MRN: [id number]  HPI Patient here today for follow up- she was seen 10/30/15 with venous stasis with cellulitis of right leg- umma boot was applied. SHe was seen for follow up on 11/03/15 by dr. Louanne Skyeettinger and at that time she had an open wound on right leg- applied duoderm and another unna boot and is here today to recheck wound.    Review of Systems  Constitutional: Negative.   HENT: Negative.   Respiratory: Negative.   Cardiovascular: Negative.   Gastrointestinal: Negative.   Genitourinary: Negative.   Neurological: Negative.   Psychiatric/Behavioral: Negative.   All other systems reviewed and are negative.      Objective:   Physical Exam  Constitutional: She is oriented to person, place, and time. She appears well-developed and well-nourished.  Cardiovascular: Normal rate, regular rhythm and normal heart sounds.   Pulmonary/Chest: Effort normal and breath sounds normal.  Neurological: She is alert and oriented to person, place, and time.  Skin: Skin is warm.  4cm indented wound right lower lateral leg- duoderm applied  Psychiatric: She has a normal mood and affect. [name] behavior is normal. Judgment and thought content normal.     BP 85/59 mmHg  Pulse 63  Temp(Src) 97.1 F (36.2 C) (Oral)  Ht 5' (1.524 m)  Wt 156 lb (70.761 kg)  BMI 30.47 kg/m2  LMP 03/27/1983      Assessment & Plan:  1. Chronic atrial fibrillation (HCC) Hold warfarin for 2 days then continue same - POCT INR  2. Ulcer of right lower leg, with unspecified severity (HCC) Will order home health to do wound care on leg RTO prn  Mary-Margaret Daphine DeutscherMartin, [name]

## 2015-11-11 DIAGNOSIS — I2510 Atherosclerotic heart disease of native coronary artery without angina pectoris: Secondary | ICD-10-CM | POA: Diagnosis not present

## 2015-11-11 DIAGNOSIS — E785 Hyperlipidemia, unspecified: Secondary | ICD-10-CM | POA: Diagnosis not present

## 2015-11-11 DIAGNOSIS — I482 Chronic atrial fibrillation: Secondary | ICD-10-CM | POA: Diagnosis not present

## 2015-11-11 DIAGNOSIS — I130 Hypertensive heart and chronic kidney disease with heart failure and stage 1 through stage 4 chronic kidney disease, or unspecified chronic kidney disease: Secondary | ICD-10-CM | POA: Diagnosis not present

## 2015-11-11 DIAGNOSIS — N189 Chronic kidney disease, unspecified: Secondary | ICD-10-CM | POA: Diagnosis not present

## 2015-11-11 DIAGNOSIS — J449 Chronic obstructive pulmonary disease, unspecified: Secondary | ICD-10-CM | POA: Diagnosis not present

## 2015-11-11 DIAGNOSIS — M1990 Unspecified osteoarthritis, unspecified site: Secondary | ICD-10-CM | POA: Diagnosis not present

## 2015-11-11 DIAGNOSIS — Z9981 Dependence on supplemental oxygen: Secondary | ICD-10-CM | POA: Diagnosis not present

## 2015-11-11 DIAGNOSIS — I5032 Chronic diastolic (congestive) heart failure: Secondary | ICD-10-CM | POA: Diagnosis not present

## 2015-11-11 DIAGNOSIS — Z9181 History of falling: Secondary | ICD-10-CM | POA: Diagnosis not present

## 2015-11-12 ENCOUNTER — Inpatient Hospital Stay (HOSPITAL_COMMUNITY): Payer: Medicare Other

## 2015-11-12 ENCOUNTER — Inpatient Hospital Stay (HOSPITAL_COMMUNITY)
Admission: EM | Admit: 2015-11-12 | Discharge: 2015-11-17 | DRG: 682 | Disposition: A | Discharge status: Home Health | Payer: Medicare Other | Attending: Internal Medicine | Admitting: Internal Medicine

## 2015-11-12 ENCOUNTER — Emergency Department (HOSPITAL_COMMUNITY): Payer: Medicare Other

## 2015-11-12 ENCOUNTER — Encounter (HOSPITAL_COMMUNITY): Payer: Self-pay | Admitting: *Deleted

## 2015-11-12 DIAGNOSIS — I481 Persistent atrial fibrillation: Secondary | ICD-10-CM | POA: Diagnosis not present

## 2015-11-12 DIAGNOSIS — N184 Chronic kidney disease, stage 4 (severe): Secondary | ICD-10-CM

## 2015-11-12 DIAGNOSIS — J069 Acute upper respiratory infection, unspecified: Secondary | ICD-10-CM | POA: Diagnosis not present

## 2015-11-12 DIAGNOSIS — I10 Essential (primary) hypertension: Secondary | ICD-10-CM | POA: Diagnosis not present

## 2015-11-12 DIAGNOSIS — I2510 Atherosclerotic heart disease of native coronary artery without angina pectoris: Secondary | ICD-10-CM | POA: Diagnosis not present

## 2015-11-12 DIAGNOSIS — I130 Hypertensive heart and chronic kidney disease with heart failure and stage 1 through stage 4 chronic kidney disease, or unspecified chronic kidney disease: Secondary | ICD-10-CM | POA: Diagnosis not present

## 2015-11-12 DIAGNOSIS — I482 Chronic atrial fibrillation: Secondary | ICD-10-CM | POA: Diagnosis present

## 2015-11-12 DIAGNOSIS — E785 Hyperlipidemia, unspecified: Secondary | ICD-10-CM | POA: Diagnosis present

## 2015-11-12 DIAGNOSIS — K219 Gastro-esophageal reflux disease without esophagitis: Secondary | ICD-10-CM | POA: Diagnosis not present

## 2015-11-12 DIAGNOSIS — R0602 Shortness of breath: Secondary | ICD-10-CM | POA: Diagnosis not present

## 2015-11-12 DIAGNOSIS — N289 Disorder of kidney and ureter, unspecified: Secondary | ICD-10-CM

## 2015-11-12 DIAGNOSIS — Z7901 Long term (current) use of anticoagulants: Secondary | ICD-10-CM | POA: Diagnosis not present

## 2015-11-12 DIAGNOSIS — R531 Weakness: Secondary | ICD-10-CM | POA: Diagnosis not present

## 2015-11-12 DIAGNOSIS — R05 Cough: Secondary | ICD-10-CM | POA: Diagnosis not present

## 2015-11-12 DIAGNOSIS — N200 Calculus of kidney: Secondary | ICD-10-CM | POA: Diagnosis not present

## 2015-11-12 DIAGNOSIS — I5032 Chronic diastolic (congestive) heart failure: Secondary | ICD-10-CM | POA: Diagnosis present

## 2015-11-12 DIAGNOSIS — J449 Chronic obstructive pulmonary disease, unspecified: Secondary | ICD-10-CM | POA: Diagnosis not present

## 2015-11-12 DIAGNOSIS — J9621 Acute and chronic respiratory failure with hypoxia: Secondary | ICD-10-CM | POA: Diagnosis not present

## 2015-11-12 DIAGNOSIS — R0902 Hypoxemia: Secondary | ICD-10-CM

## 2015-11-12 DIAGNOSIS — R600 Localized edema: Secondary | ICD-10-CM

## 2015-11-12 DIAGNOSIS — I4891 Unspecified atrial fibrillation: Secondary | ICD-10-CM | POA: Diagnosis present

## 2015-11-12 DIAGNOSIS — E860 Dehydration: Secondary | ICD-10-CM

## 2015-11-12 DIAGNOSIS — Z66 Do not resuscitate: Secondary | ICD-10-CM | POA: Diagnosis present

## 2015-11-12 DIAGNOSIS — Z955 Presence of coronary angioplasty implant and graft: Secondary | ICD-10-CM

## 2015-11-12 DIAGNOSIS — R609 Edema, unspecified: Secondary | ICD-10-CM

## 2015-11-12 DIAGNOSIS — N179 Acute kidney failure, unspecified: Secondary | ICD-10-CM

## 2015-11-12 DIAGNOSIS — I480 Paroxysmal atrial fibrillation: Secondary | ICD-10-CM | POA: Diagnosis not present

## 2015-11-12 DIAGNOSIS — E1122 Type 2 diabetes mellitus with diabetic chronic kidney disease: Secondary | ICD-10-CM | POA: Diagnosis not present

## 2015-11-12 DIAGNOSIS — I289 Disease of pulmonary vessels, unspecified: Secondary | ICD-10-CM | POA: Diagnosis not present

## 2015-11-12 DIAGNOSIS — Z87891 Personal history of nicotine dependence: Secondary | ICD-10-CM | POA: Diagnosis not present

## 2015-11-12 DIAGNOSIS — Z9981 Dependence on supplemental oxygen: Secondary | ICD-10-CM | POA: Diagnosis not present

## 2015-11-12 DIAGNOSIS — I252 Old myocardial infarction: Secondary | ICD-10-CM | POA: Diagnosis not present

## 2015-11-12 DIAGNOSIS — E119 Type 2 diabetes mellitus without complications: Secondary | ICD-10-CM

## 2015-11-12 DIAGNOSIS — I5033 Acute on chronic diastolic (congestive) heart failure: Secondary | ICD-10-CM | POA: Diagnosis present

## 2015-11-12 DIAGNOSIS — I6529 Occlusion and stenosis of unspecified carotid artery: Secondary | ICD-10-CM | POA: Diagnosis present

## 2015-11-12 DIAGNOSIS — L03115 Cellulitis of right lower limb: Secondary | ICD-10-CM | POA: Diagnosis not present

## 2015-11-12 DIAGNOSIS — F418 Other specified anxiety disorders: Secondary | ICD-10-CM | POA: Diagnosis present

## 2015-11-12 DIAGNOSIS — Y95 Nosocomial condition: Secondary | ICD-10-CM | POA: Diagnosis present

## 2015-11-12 LAB — COMPREHENSIVE METABOLIC PANEL
ALT: 13 U/L — ABNORMAL LOW (ref 14–54)
AST: 20 U/L (ref 15–41)
Albumin: 2.7 g/dL — ABNORMAL LOW (ref 3.5–5.0)
Alkaline Phosphatase: 43 U/L (ref 38–126)
Anion gap: 9 (ref 5–15)
BUN: 33 mg/dL — ABNORMAL HIGH (ref 6–20)
CO2: 29 mmol/L (ref 22–32)
Calcium: 8.5 mg/dL — ABNORMAL LOW (ref 8.9–10.3)
Chloride: 98 mmol/L — ABNORMAL LOW (ref 101–111)
Creatinine, Ser: 2.6 mg/dL — ABNORMAL HIGH (ref 0.44–1.00)
GFR calc Af Amer: 19 mL/min — ABNORMAL LOW (ref >60)
GFR calc non Af Amer: 16 mL/min — ABNORMAL LOW (ref >60)
Glucose, Bld: 100 mg/dL — ABNORMAL HIGH (ref 65–99)
Potassium: 4.6 mmol/L (ref 3.5–5.1)
Sodium: 136 mmol/L (ref 135–145)
Total Bilirubin: 0.8 mg/dL (ref 0.3–1.2)
Total Protein: 4.9 g/dL — ABNORMAL LOW (ref 6.5–8.1)

## 2015-11-12 LAB — CBC WITH DIFFERENTIAL/PLATELET
Basophils Absolute: 0 10*3/uL (ref 0.0–0.1)
Basophils Relative: 0 %
Eosinophils Absolute: 0.1 10*3/uL (ref 0.0–0.7)
Eosinophils Relative: 1 %
HCT: 36.2 % (ref 36.0–46.0)
Hemoglobin: 11.4 g/dL — ABNORMAL LOW (ref 12.0–15.0)
Lymphocytes Relative: 14 %
Lymphs Abs: 1.1 10*3/uL (ref 0.7–4.0)
MCH: 29.8 pg (ref 26.0–34.0)
MCHC: 31.5 g/dL (ref 30.0–36.0)
MCV: 94.5 fL (ref 78.0–100.0)
Monocytes Absolute: 1.1 10*3/uL — ABNORMAL HIGH (ref 0.1–1.0)
Monocytes Relative: 13 %
Neutro Abs: 6 10*3/uL (ref 1.7–7.7)
Neutrophils Relative %: 72 %
Platelets: 270 10*3/uL (ref 150–400)
RBC: 3.83 MIL/uL — ABNORMAL LOW (ref 3.87–5.11)
RDW: 15 % (ref 11.5–15.5)
WBC: 8.4 10*3/uL (ref 4.0–10.5)

## 2015-11-12 LAB — I-STAT TROPONIN, ED: Troponin i, poc: 0.03 ng/mL (ref 0.00–0.08)

## 2015-11-12 LAB — PROTIME-INR
INR: 2.81 — ABNORMAL HIGH (ref 0.00–1.49)
INR: 2.6 — ABNORMAL HIGH (ref 0.00–1.49)
Prothrombin Time: 29.1 seconds — ABNORMAL HIGH (ref 11.6–15.2)
Prothrombin Time: 27.5 seconds — ABNORMAL HIGH (ref 11.6–15.2)

## 2015-11-12 LAB — GLUCOSE, CAPILLARY
Glucose-Capillary: 94 mg/dL (ref 65–99)
Glucose-Capillary: 115 mg/dL — ABNORMAL HIGH (ref 65–99)
Glucose-Capillary: 114 mg/dL — ABNORMAL HIGH (ref 65–99)
Glucose-Capillary: 121 mg/dL — ABNORMAL HIGH (ref 65–99)
Glucose-Capillary: 128 mg/dL — ABNORMAL HIGH (ref 65–99)

## 2015-11-12 LAB — I-STAT CG4 LACTIC ACID, ED: Lactic Acid, Venous: 1.32 mmol/L (ref 0.5–2.0)

## 2015-11-12 LAB — BRAIN NATRIURETIC PEPTIDE: B Natriuretic Peptide: 686.4 pg/mL — ABNORMAL HIGH (ref 0.0–100.0)

## 2015-11-12 MED ORDER — SODIUM CHLORIDE 0.9 % IV BOLUS (SEPSIS)
500.0000 mL | Freq: Once | INTRAVENOUS | Status: AC
  Administered 2015-11-12: 500 mL via INTRAVENOUS

## 2015-11-12 MED ORDER — BUSPIRONE HCL 15 MG PO TABS
7.5000 mg | ORAL_TABLET | Freq: Two times a day (BID) | ORAL | Status: DC
  Administered 2015-11-12 – 2015-11-17 (×11): 7.5 mg via ORAL
  Filled 2015-11-12 (×15): qty 1

## 2015-11-12 MED ORDER — ATORVASTATIN CALCIUM 40 MG PO TABS
40.0000 mg | ORAL_TABLET | Freq: Every day | ORAL | Status: DC
  Administered 2015-11-12 – 2015-11-16 (×5): 40 mg via ORAL
  Filled 2015-11-12 (×5): qty 1

## 2015-11-12 MED ORDER — FLUTICASONE PROPIONATE 50 MCG/ACT NA SUSP
2.0000 | Freq: Every day | NASAL | Status: DC
  Administered 2015-11-12 – 2015-11-17 (×5): 2 via NASAL
  Filled 2015-11-12 (×2): qty 16

## 2015-11-12 MED ORDER — CITALOPRAM HYDROBROMIDE 10 MG PO TABS: 20.0000 mg | ORAL_TABLET | Freq: Every day | ORAL | Status: DC

## 2015-11-12 MED ORDER — ALENDRONATE SODIUM 70 MG PO TABS: 70.0000 mg | ORAL_TABLET | ORAL | Status: DC

## 2015-11-12 MED ORDER — LORATADINE 10 MG PO TABS
10.0000 mg | ORAL_TABLET | Freq: Every day | ORAL | Status: DC
  Administered 2015-11-12 – 2015-11-17 (×6): 10 mg via ORAL
  Filled 2015-11-12 (×5): qty 1

## 2015-11-12 MED ORDER — MOMETASONE FURO-FORMOTEROL FUM 100-5 MCG/ACT IN AERO
2.0000 | INHALATION_SPRAY | Freq: Two times a day (BID) | RESPIRATORY_TRACT | Status: DC
  Administered 2015-11-12 – 2015-11-16 (×10): 2 via RESPIRATORY_TRACT
  Filled 2015-11-12: qty 8.8

## 2015-11-12 MED ORDER — NITROGLYCERIN 0.4 MG SL SUBL: 0.4000 mg | SUBLINGUAL_TABLET | SUBLINGUAL | Status: DC | PRN

## 2015-11-12 MED ORDER — INSULIN ASPART 100 UNIT/ML ~~LOC~~ SOLN
0.0000 [IU] | Freq: Three times a day (TID) | SUBCUTANEOUS | Status: DC
  Administered 2015-11-12: 1 [IU] via SUBCUTANEOUS
  Administered 2015-11-13: 2 [IU] via SUBCUTANEOUS
  Administered 2015-11-13: 1 [IU] via SUBCUTANEOUS
  Administered 2015-11-13: 3 [IU] via SUBCUTANEOUS
  Administered 2015-11-14 (×2): 2 [IU] via SUBCUTANEOUS
  Administered 2015-11-15: 1 [IU] via SUBCUTANEOUS
  Administered 2015-11-16: 2 [IU] via SUBCUTANEOUS
  Administered 2015-11-17: 3 [IU] via SUBCUTANEOUS

## 2015-11-12 MED ORDER — WARFARIN SODIUM 5 MG PO TABS
5.0000 mg | ORAL_TABLET | ORAL | Status: DC
  Administered 2015-11-13 – 2015-11-15 (×2): 5 mg via ORAL
  Filled 2015-11-12 (×2): qty 1

## 2015-11-12 MED ORDER — HYDROCODONE-ACETAMINOPHEN 5-325 MG PO TABS
1.0000 | ORAL_TABLET | Freq: Four times a day (QID) | ORAL | Status: DC | PRN
  Administered 2015-11-13 – 2015-11-17 (×3): 1 via ORAL
  Filled 2015-11-12 (×4): qty 1

## 2015-11-12 MED ORDER — SODIUM CHLORIDE 0.9 % IV SOLN
INTRAVENOUS | Status: DC
  Administered 2015-11-12 – 2015-11-13 (×3): via INTRAVENOUS

## 2015-11-12 MED ORDER — ACETAMINOPHEN 650 MG RE SUPP: 650.0000 mg | Freq: Four times a day (QID) | RECTAL | Status: DC | PRN

## 2015-11-12 MED ORDER — WARFARIN - PHARMACIST DOSING INPATIENT
Freq: Every day | Status: DC
  Administered 2015-11-12 – 2015-11-16 (×4)

## 2015-11-12 MED ORDER — METOPROLOL TARTRATE 100 MG PO TABS
100.0000 mg | ORAL_TABLET | Freq: Two times a day (BID) | ORAL | Status: DC
  Administered 2015-11-12 – 2015-11-17 (×11): 100 mg via ORAL
  Filled 2015-11-12 (×10): qty 1

## 2015-11-12 MED ORDER — DILTIAZEM HCL ER COATED BEADS 360 MG PO CP24
360.0000 mg | ORAL_CAPSULE | Freq: Every day | ORAL | Status: DC
  Administered 2015-11-12 – 2015-11-17 (×6): 360 mg via ORAL
  Filled 2015-11-12 (×2): qty 1
  Filled 2015-11-12 (×2): qty 2
  Filled 2015-11-12: qty 1
  Filled 2015-11-12: qty 2
  Filled 2015-11-12: qty 1
  Filled 2015-11-12: qty 2
  Filled 2015-11-12: qty 1
  Filled 2015-11-12: qty 2
  Filled 2015-11-12: qty 1

## 2015-11-12 MED ORDER — ACETAMINOPHEN 325 MG PO TABS
650.0000 mg | ORAL_TABLET | Freq: Four times a day (QID) | ORAL | Status: DC | PRN
  Administered 2015-11-12 – 2015-11-16 (×3): 650 mg via ORAL
  Filled 2015-11-12 (×2): qty 2

## 2015-11-12 MED ORDER — PANTOPRAZOLE SODIUM 40 MG PO TBEC
40.0000 mg | DELAYED_RELEASE_TABLET | Freq: Every day | ORAL | Status: DC
  Administered 2015-11-12 – 2015-11-17 (×6): 40 mg via ORAL
  Filled 2015-11-12 (×5): qty 1

## 2015-11-12 MED ORDER — METOPROLOL TARTRATE 1 MG/ML IV SOLN
5.0000 mg | INTRAVENOUS | Status: DC | PRN
  Administered 2015-11-12: 5 mg via INTRAVENOUS

## 2015-11-12 MED ORDER — WARFARIN SODIUM 2.5 MG PO TABS
2.5000 mg | ORAL_TABLET | ORAL | Status: DC
  Administered 2015-11-12 – 2015-11-16 (×3): 2.5 mg via ORAL
  Filled 2015-11-12 (×4): qty 1

## 2015-11-12 MED ORDER — SODIUM CHLORIDE 0.9 % IJ SOLN
3.0000 mL | Freq: Two times a day (BID) | INTRAMUSCULAR | Status: DC
  Administered 2015-11-13 – 2015-11-16 (×6): 3 mL via INTRAVENOUS

## 2015-11-12 MED ORDER — TIOTROPIUM BROMIDE MONOHYDRATE 18 MCG IN CAPS
18.0000 ug | ORAL_CAPSULE | Freq: Every day | RESPIRATORY_TRACT | Status: DC
  Administered 2015-11-12 – 2015-11-16 (×5): 18 ug via RESPIRATORY_TRACT
  Filled 2015-11-12 (×3): qty 5

## 2015-11-12 NOTE — ED Notes (Signed)
Family is requesting info on room location. [name] [phone]

## 2015-11-12 NOTE — Plan of Care (Signed)
Problem: Safety: Goal: Ability to remain free from injury will improve Outcome: Progressing Pt noted with episodes of confusion. Re-oriented as needed. She sometimes forgets to call for assistance to get out of bed. Bed alarm activated at all times when pt in bed. Pt has been free from falls.  Problem: Pain Managment: Goal: General experience of comfort will improve Outcome: Progressing Pt denies any pain this shift.

## 2015-11-12 NOTE — ED Notes (Signed)
Patient transported to Ultrasound 

## 2015-11-12 NOTE — Care Management Note (Signed)
Case Management Note  Patient Details  Name: Alfredia FergusonDessie A Wolin MRN: [id number] Date of Birth: 26-Dec-1933  Subjective/Objective:    Pt admitted with generalized weakness and dehydration; pt in A Fib with RVR                Action/Plan:  Pt is alone from home with home health RN, PT , Aide, pt stated she [facility], CM contacted agency, agency to confirm.  [facility]     Expected Discharge Date:                  Expected Discharge Plan:  Home w Home Health Services  In-House Referral:     Discharge planning Services  CM Consult  Post Acute Care Choice:    Choice offered to:     DME Arranged:    DME Agency:     HH Arranged:    HH Agency:     Status of Service:  In process, will continue to follow  Medicare Important Message Given:    Date Medicare IM Given:    Medicare IM give by:    Date Additional Medicare IM Given:    Additional Medicare Important Message give by:     If discussed at Long Length of Stay Meetings, dates discussed:    Additional Comments:  Cherylann ParrClaxton, Aviyon Hocevar S, RN 11/12/2015, 3:26 PM

## 2015-11-12 NOTE — Progress Notes (Signed)
ANTICOAGULATION CONSULT NOTE - Initial Consult  Pharmacy Consult for Coumadin Indication: atrial fibrillation  No Known Allergies  Patient Measurements: Height: 5' (152.4 cm) Weight: 156 lb (70.761 kg) IBW/kg (Calculated) : 45.5  Vital Signs: Temp: 98.4 F (36.9 C) (11/16 0149) Temp Source: Oral (11/16 0149) BP: 115/69 mmHg (11/16 0345) Pulse Rate: 37 (11/16 0345)  Labs:  Recent Labs  11/10/15 1241 11/12/15 0241  HGB  --  11.4*  HCT  --  36.2  PLT  --  270  LABPROT  --  29.1*  INR 3.6 2.81*  CREATININE  --  2.60*    Estimated Creatinine Clearance: 15.1 mL/min (by C-G formula based on Cr of 2.6).   Medical History: Past Medical History  Diagnosis Date  . Hypertension   . CAD (coronary artery disease) 5/96  . Edema   . Hyperlipidemia   . Chronic atrial fibrillation (HCC)     Coumadin therapy   . Osteopenia   . Renal insufficiency   . Closed right ankle fracture   . CHF (congestive heart failure) (HCC)   . LBP (low back pain)   . Arthritis   . Cataract   . COPD (chronic obstructive pulmonary disease) (HCC)     wears O2 at home as needed  . Esophageal stricture   . Esophageal dysmotility 09/2011    seen on barium esophagram.  . Carotid artery occlusion   . Myocardial infarction (HCC)     Medications:  Fosamax  Lipitor  Buspar  Celexa  Dexilant  Cardizem  FLonase  Advair  Claritin  Lopressor  Ntg  Spiriva  Lasix  Metformin  KCl Coumadin 5 mg TTSat  2.5 mg MWFSun  Assessment: 81 y.o. female admitted with weakness/hypotension, h/o Afib, to continue Coumadin Goal of Therapy:  INR 2-3 Monitor platelets by anticoagulation protocol: Yes   Plan:  Continue home regimen  Daily INR  Alline Pio, Gary FleetGregory Vernon 11/12/2015,4:36 AM

## 2015-11-12 NOTE — H&P (Addendum)
[facility] History and Physical  [name] ZOX:[id number] DOB: [birth year] DOA: 11/12/2015  Referring physician: ED physician PCP: Katie Pierini, [name]  Specialists:   Chief Complaint: Generalized weakness  HPI: [name] is a 81 y.o. female with PMH of hypertension, hyperlipidemia, diabetes mellitus, COPD on 2 L oxygen at home, GERD, depression, anxiety, CAD, stent placement, atrial fibrillation on Coumadin, CKD-4, diastolic congestive heart failure, chronic lower back pain, esophageal stricture, who presents with a generalized weakness.  Patient reports that she does not feel right and has funny feeling since yesterday. Pt cannot elaborate and will not give any other details. Per EDP, pt has generalized weakness, lightheadedness, back pain, decreased appetite, and nonproductive cough.  Pt denies chest pain, abdominal pain, diarrhea, dysuria, unilateral weakness. Chart review showed that pt was recently treated for R leg cellulitis and has duoderm applied to her open wound in R leg. She denies pain in leg. Of  Note, recently had Torsemid dosing increased to 60 mg per day due to lower extremity swelling. That dose was cut back to 40 mg this week.  In ED, patient was found to have INR 2.81, A2 troponin, likely due to 1.32, BNP 686.4, temperature normal, worsening renal function. Pending urinalysis. Chest x-ray showed mild interstitial edema. Patient submitted to inpatient for further evaluated the treatment.  Where does patient live?   At home   Can patient participate in ADLs?  None  Review of Systems:   General: no fevers, chills, no changes in body weight, has fatigue HEENT: no blurry vision, hearing changes or sore throat Pulm: no dyspnea, coughing, wheezing CV: no chest pain, palpitations Abd: no nausea, vomiting, abdominal pain, diarrhea, constipation GU: no dysuria, burning on urination, increased urinary frequency, hematuria  Ext: has leg edema Neuro: no  unilateral weakness, numbness, or tingling, no vision change or hearing loss Skin: no rash. Has a small wound over lateral side of R lower leg. R lower leg is erythematous, but no tenderness or warmth.  MSK: No muscle spasm, no deformity, no limitation of range of movement in spin Heme: No easy bruising.  Travel history: No recent long distant travel.  Allergy: No Known Allergies  Past Medical History  Diagnosis Date  . Hypertension   . CAD (coronary artery disease) 5/96  . Edema   . Hyperlipidemia   . Chronic atrial fibrillation (HCC)     Coumadin therapy   . Osteopenia   . Renal insufficiency   . Closed right ankle fracture   . CHF (congestive heart failure) (HCC)   . LBP (low back pain)   . Arthritis   . Cataract   . COPD (chronic obstructive pulmonary disease) (HCC)     wears O2 at home as needed  . Esophageal stricture   . Esophageal dysmotility 09/2011    seen on barium esophagram.  . Carotid artery occlusion   . Myocardial infarction [facility])     Past Surgical History  Procedure Laterality Date  . Heart stent      X2  . Mole removed  2013    umbilicus  . Esophagogastroduodenoscopy  2012, 05/2013,09/2013    dilation of esophagus and stricture in 09/2011, 05/2013.  meat disimpaction 09/2013  . Esophagogastroduodenoscopy N/A 10/18/2013    Procedure: ESOPHAGOGASTRODUODENOSCOPY (EGD);  Surgeon: Katie Dare, MD;  Location: [facility] ENDOSCOPY;  Service: Endoscopy;  Laterality: N/A;  possible food impaction    Social History:  reports that she has quit smoking. Her smoking use included Cigarettes. She  smoked 0.50 packs per day. She has never used smokeless tobacco. She reports that she does not drink alcohol or use illicit drugs.  Family History:  Family History  Problem Relation Age of Onset  . Colon cancer Neg Hx   . Esophageal cancer Neg Hx   . Stomach cancer Neg Hx   . Rectal cancer Neg Hx   . Hyperlipidemia Mother   . Hypertension Mother   . Hyperlipidemia Father    . Heart disease Father     After age 79  . Cancer Sister     Lung  . Hyperlipidemia Sister   . Hypertension Sister      Prior to Admission medications   Medication Sig Start Date End Date Taking? Authorizing Provider  alendronate (FOSAMAX) 70 MG tablet Take 1 tablet (70 mg total) by mouth once a week. Take with a full glass of water on an empty stomach. 09/16/15  Yes Mary-Margaret Daphine DeutscherMartin, [name]  atorvastatin (LIPITOR) 40 MG tablet TAKE 1 TABLET (40 MG TOTAL) BY MOUTH DAILY. 09/16/15  Yes Mary-Margaret Daphine DeutscherMartin, [name]  busPIRone (BUSPAR) 7.5 MG tablet Take 1 tablet (7.5 mg total) by mouth 2 (two) times daily. . 09/24/15  Yes Mechele ClaudeWarren Stacks, MD  citalopram (CELEXA) 20 MG tablet Take 1 tablet (20 mg total) by mouth daily. 10/14/15  Yes Catarina Hartshornavid Tat, MD  dexlansoprazole (DEXILANT) 60 MG capsule TAKE 1 CAPSULE (60 MG TOTAL) BY MOUTH DAILY. 09/16/15  Yes Mary-Margaret Daphine DeutscherMartin, [name]  diltiazem (CARDIZEM CD) 360 MG 24 hr capsule Take 1 capsule (360 mg total) by mouth daily. 10/14/15  Yes Catarina Hartshornavid Tat, MD  fluticasone (FLONASE) 50 MCG/ACT nasal spray Place 2 sprays into both nostrils daily. 10/14/15  Yes David Tat, MD  Fluticasone-Salmeterol (ADVAIR DISKUS) 250-50 MCG/DOSE AEPB Inhale 1 puff into the lungs 2 (two) times daily. 06/16/15  Yes Mary-Margaret Daphine DeutscherMartin, [name]  furosemide (LASIX) 20 MG tablet Take 40 mg by mouth daily.   Yes Historical Provider, MD  glucose blood (ONETOUCH VERIO) test strip Test 1x per day and prn   Dx- e11.9 10/17/15  Yes Mary-Margaret Daphine DeutscherMartin, [name]  HYDROcodone-acetaminophen (NORCO/VICODIN) 5-325 MG per tablet Take 1 tablet by mouth 2 (two) times daily as needed for moderate pain. Patient taking differently: Take 1 tablet by mouth every 6 (six) hours as needed for moderate pain.  09/10/15  Yes Mary-Margaret Daphine DeutscherMartin, [name]  Lancets [facility](ONETOUCH ULTRASOFT) lancets Use as instructed 10/23/15  Yes Mary-Margaret Daphine DeutscherMartin, [name]  loratadine (CLARITIN) 10 MG tablet Take 1 tablet (10 mg total) by mouth daily.  10/14/15  Yes Catarina Hartshornavid Tat, MD  metFORMIN (GLUMETZA) 500 MG (MOD) 24 hr tablet Take 500 mg by mouth daily with breakfast.   Yes Historical Provider, MD  metoprolol (LOPRESSOR) 100 MG tablet Take 1 tablet (100 mg total) by mouth 2 (two) times daily. 10/06/15  Yes Mary-Margaret Daphine DeutscherMartin, [name]  nitroGLYCERIN (NITROSTAT) 0.4 MG SL tablet Place 1 tablet (0.4 mg total) under the tongue every 5 (five) minutes as needed for chest pain. 04/10/14  Yes Rollene RotundaJames Hochrein, MD  potassium chloride SA (K-DUR,KLOR-CON) 20 MEQ tablet Take 20 mEq by mouth daily.   Yes Historical Provider, MD  tiotropium (SPIRIVA HANDIHALER) 18 MCG inhalation capsule PLACE 1 CAPSULE (18 MCG TOTAL) INTO INHALER AND INHALE DAILY. 09/16/15  Yes Mary-Margaret Daphine DeutscherMartin, [name]  warfarin (COUMADIN) 5 MG tablet Take 0.5-1 tablets (2.5-5 mg total) by mouth See admin instructions. Takes 1 tablet (5mg ) every Tuesdays and Fridays and 1/2 tablet (2.5 mg) all other days. Patient taking differently: Take  2.5-5 mg by mouth See admin instructions. Takes 1 tablet ( ) every Tuesdays, Thursday and Saturday. 1/2 tablet (2.5 mg) all other days. 09/16/15  Yes Mary-Margaret Daphine Deutscher, [name]    Physical Exam: Filed Vitals:   11/12/15 0345 11/12/15 0400 11/12/15 0415 11/12/15 0430  BP: 115/69 132/82 118/52 109/66  Pulse: 37 92 39 32  Temp:      TempSrc:      Resp: Height:      Weight:      SpO2: 97% 95% 96% 95%   General: Not in acute distress. Dry mucus and membrane. HEENT:       Eyes: PERRL, EOMI, no scleral icterus.       ENT: No discharge from the ears and nose, no pharynx injection, no tonsillar enlargement.        Neck: No JVD, no bruit, no mass felt. Heme: No neck lymph node enlargement. Cardiac: S1/S2, RRR, No murmurs, No gallops or rubs. Pulm: No rales, wheezing, rhonchi or rubs. Abd: Soft, nondistended, nontender, no rebound pain, no organomegaly, BS present. Ext: 1+  pitting leg edema bilaterally. 2+DP/PT pulse bilaterally. Musculoskeletal:  No joint deformities, No joint redness or warmth, no limitation of ROM in spin. Skin: No rashes. Her is small wound in right lower lateral leg- duoderm applied. No discharge.   Neuro: drowsy, but oriented X3, cranial nerves II-XII grossly intact, muscle strength 5/5 in all extremities, sensation to light touch intact. Psych: Patient is not psychotic, no suicidal or hemocidal ideation.  Labs on Admission:  Basic Metabolic Panel:  Recent Labs Lab 11/12/15 0241  NA 136  K 4.6  CL 98*  CO2 29  GLUCOSE 100*  BUN 33*  CREATININE 2.60*  CALCIUM 8.5*   Liver Function Tests:  Recent Labs Lab 11/12/15 0241  AST 20  ALT 13*  ALKPHOS 43  BILITOT 0.8  PROT 4.9*  ALBUMIN 2.7*   No results for input(s): LIPASE, AMYLASE in the last 168 hours. No results for input(s): AMMONIA in the last 168 hours. CBC:  Recent Labs Lab 11/12/15 0241  WBC 8.4  NEUTROABS 6.0  HGB 11.4*  HCT 36.2  MCV 94.5  PLT 270   Cardiac Enzymes: No results for input(s): CKTOTAL, CKMB, CKMBINDEX, TROPONINI in the last 168 hours.  BNP (last 3 results)  Recent Labs  07/29/15 1828 10/09/15 0347 11/12/15 0241  BNP 203.6* 375.0* 686.4*    ProBNP (last 3 results) No results for input(s): PROBNP in the last 8760 hours.  CBG: No results for input(s): GLUCAP in the last 168 hours.  Radiological Exams on Admission: Dg Chest Port 1 View  11/12/2015  CLINICAL DATA:  Acute onset of cough and shortness of breath. Initial encounter. EXAM: PORTABLE CHEST 1 VIEW COMPARISON:  Chest radiograph performed 10/26/2015 FINDINGS: The lungs are well-aerated. Vascular congestion is noted, with increased interstitial markings, concerning for mild interstitial edema. Underlying atelectasis is noted. There is no evidence of pleural effusion or pneumothorax. The cardiomediastinal silhouette is mildly enlarged. No acute osseous abnormalities are seen. IMPRESSION: Vascular congestion and mild cardiomegaly, with increased  interstitial markings, concerning for mild interstitial edema. Underlying atelectasis noted. Electronically Signed   By: Roanna Raider M.D.   On: 11/12/2015 02:44    EKG: Independently reviewed. QTC 576, atrial fibrillation, widening QRS wave   Assessment/Plan Principal Problem:   Generalized weakness Active Problems:   Essential hypertension, benign   CAD (coronary artery disease), native coronary artery   Hyperlipidemia with target LDL less  than 100   Atrial fibrillation (HCC)   Gastroesophageal reflux disease without esophagitis   COPD (chronic obstructive pulmonary disease) (HCC)   Acute renal failure superimposed on stage 4 chronic kidney disease (HCC)   Chronic diastolic CHF (congestive heart failure) (HCC)   Diabetes mellitus without complication (HCC)   Cellulitis of right leg   Dehydration   Depression with anxiety   Generalized weakness: Etiology is not clear. Likely multifactorial, including worsening renal function, multiple comorbidities, dehydration and deconditioning. -will admit to tele bed -check UA to r/o UTI -follow up blood culture which was ordered by ED. -500 cc of NS was given in ED -pt/ot  Atrial Fibrillation: CHA2DS2-VASc Score is 6, needs oral anticoagulation. Patient is on Coumadin at home. INR is 2.81on admission. Heart rate is btw 40 -110 which is OK -continue coumadin  -Cardizem and metoprolol  Essential hypertension, benign: -On Cardizem, metoprolol  CAD (coronary artery disease), native coronary artery: no chest pain -Continue metoprolol, Lipitor, when necessary nitroglycerin  HLD: Last LDL was 83 on 09/17/15 -Continue home medications: Lipitor  GERD: -Protonix  COPD (chronic obstructive pulmonary disease) (HCC): stable -Dulera inhaler and spiriva inhaler,  AoCKD-IV: Baseline Cre is 1.3-1.8, her Cre is 2.6, BUN 33 on admission. Likely due to prerenal secondary to dehydration and continuation of diruetics. - IVF as above - Check  FeUrea - US-renal - Follow up renal function by BMP - Hold Lasix  Chronic diastolic CHF (congestive heart failure) (HCC): recently had Torsemid dosing increased to 60 mg per day due to lower extremity swelling. That dose was cut back to 40 mg this week. Though patient has 1+ leg edema, she has worsening renal function, dry mucous and membrane on admission, indicating intravascular volume depletion. Her elevated BNP 686 is likely due to worsening renal function. -Hold her Lasix -Continue metoprolol  DM-II: Last A1c 6.4 on 09/17/15, well controled. Patient is taking metformin at home -SSI  HLD: Last LDL was 83 on 09/17/15 -Continue home medications: Lipitor  Hx of cellulitis of right leg: Patient was recently treated for right leg cellulitis. She still has a small wound in right lateral leg. No discharge. Patient does not have fever or leukocytosis, no indication for antibiotics. -Consult wound care team  Depression and anxiety: Stable, no suicidal or homicidal ideations. -Continue home BuSpar -Hold Celexa due to QT prolongation   DVT ppx: on Coumadin  Code Status: DNR Family Communication: None at bed side.  Disposition Plan: Admit to inpatient   Date of Service 11/12/2015    Lorretta Harp [facility] Pager [phone]  If 7PM-7AM, please contact night-coverage [email or web address] Password [email or web address] 11/12/2015, 5:26 AM

## 2015-11-12 NOTE — Plan of Care (Signed)
Patient admitted earlier this morning with dehydration, generalized weakness acute renal failure, chronic A. fib with RVR. Added further IV fluids for hydration, monitor renal function, heart medications adjusted with as needed IV Lopressor. Already on Coumadin pharmacy monitoring. Increase activity. PT. Likely discharge in 1-2 days.

## 2015-11-12 NOTE — Progress Notes (Signed)
PT Cancellation Note  Patient Details Name: Katie FergusonDessie A Melendez MRN: [id number] DOB: Feb 13, 1933   Cancelled Treatment:    Reason Eval/Treat Not Completed: Patient not medically ready Holding PT evaluation as pt on bedrest until 11/12/15 at 04:38 PM. Will follow up next available time.   Blake DivineShauna A Eliabeth Shoff 11/12/2015, 9:08 AM Mylo RedShauna Sherisse Fullilove, PT, DPT [phone]

## 2015-11-12 NOTE — ED Notes (Signed)
Family at bedside. 

## 2015-11-12 NOTE — Evaluation (Signed)
Physical Therapy Evaluation Patient Details Name: ANAISE STERBENZ MRN: [id number] DOB: 1933-12-25 Today's Date: 11/12/2015   History of Present Illness  Patient is a 81 y/o female presents with generalized weakness and dehydration, found to be in A Fib with RVR. PMH includes COPD on 2L 02 at home, CAD s/p PCI x2, A-fib on warfarin, CKD stage IV, chronic diastolic CHF, and smoking.  Clinical Impression  Patient presents with functional limitations due to deficits listed in PT problem list (see below). Pt with generalized weakness, DOE and impaired balance impacting safe mobility. Sp02 dropped to 87% on 3L/min 02. Pt reports fall history. Pt lives alone and has some support from family. Recommend ST SNF to maximize independence and mobility prior to return home and to minimize fall risk.    Follow Up Recommendations SNF    Equipment Recommendations  None recommended by PT    Recommendations for Other Services OT consult     Precautions / Restrictions Precautions Precautions: Fall Restrictions Weight Bearing Restrictions: No      Mobility  Bed Mobility Overal bed mobility: Needs Assistance Bed Mobility: Supine to Sit;Sit to Supine     Supine to sit: Supervision;HOB elevated Sit to supine: Supervision;HOB elevated   General bed mobility comments: No physical assist needed. Use of rails.   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 Trembling noted in BLEs upon standing.   Ambulation/Gait Ambulation/Gait assistance: Min assist Ambulation Distance (Feet): 40 Feet Assistive device: Rolling walker (2 wheeled) Gait Pattern/deviations: Step-through pattern;Decreased stride length;Trunk flexed Gait velocity: decreased   General Gait Details: Slow, unsteady gait wtih Min  A for balance. DOE. Sp02 dropped to 87% on 3L/min o2. 3/4 dyspnea.  Stairs            Wheelchair Mobility    Modified Rankin (Stroke Patients Only)       Balance Overall balance assessment: Needs assistance Sitting-balance support: Feet supported;No upper extremity supported Sitting balance-Leahy Scale: Fair     Standing balance support: During functional activity Standing balance-Leahy Scale: Poor Standing balance comment: Relient on UEs for support.                             Pertinent Vitals/Pain Pain Assessment: No/denies pain    Home Living Family/patient expects to be discharged to:: Private residence Living Arrangements: Alone Available Help at Discharge: Family;Available PRN/intermittently (niece and nephew live behind her) Type of Home: House Home Access: Level entry Entrance Stairs-Rails: None   Home Layout: One level Home Equipment: Walker - 2 wheels;Cane - single point;Tub bench;Hospital bed;Walker - 4 wheels;Bedside commode      Prior Function Level of Independence: Independent with assistive device(s)         Comments: Uses rollator for all ambulation at baseline. Reports falls. Assist needed for grocery shopping, IADls.     Hand Dominance   Dominant Hand: Right    Extremity/Trunk Assessment   Upper Extremity Assessment: Defer to OT evaluation           Lower Extremity Assessment: Generalized weakness         Communication   Communication: No difficulties  Cognition Arousal/Alertness: Awake/alert Behavior During Therapy: WFL for tasks assessed/performed Overall Cognitive Status: Within Functional Limits for tasks assessed  General Comments      Exercises        Assessment/Plan    PT Assessment Patient needs continued PT services  PT Diagnosis Difficulty walking;Generalized weakness   PT Problem List Decreased strength;Cardiopulmonary status limiting activity;Decreased  activity tolerance;Decreased balance;Decreased mobility  PT Treatment Interventions Balance training;Gait training;Functional mobility training;Therapeutic activities;Therapeutic exercise;Patient/family education   PT Goals (Current goals can be found in the Care Plan section) Acute Rehab PT Goals Patient Stated Goal: " I will do what I gotta do," Want to be able to mop her floor PT Goal Formulation: With patient Time For Goal Achievement: 11/26/15 Potential to Achieve Goals: Fair    Frequency Min 3X/week   Barriers to discharge Decreased caregiver support Pt lives alone    Co-evaluation               End of Session Equipment Utilized During Treatment: Gait belt;Oxygen Activity Tolerance: Patient limited by fatigue Patient left: in bed;with call bell/phone within reach;with bed alarm set Nurse Communication: Mobility status         Time: [id number] PT Time Calculation (min) (ACUTE ONLY): 22 min   Charges:   PT Evaluation $Initial PT Evaluation Tier I: 1 Procedure     PT G Codes:        Bellanie Matthew A Ieisha Gao 11/12/2015, 4:05 PM Mylo RedShauna Rhylynn Perdomo, PT, DPT [phone]

## 2015-11-12 NOTE — Consult Note (Signed)
WOC wound consult note Reason for Consult: Consult requested for right lower calf wound. Wound type: Partial thickness abrasion Measurement: .2X.2X.2cm Wound bed: pink and moist Drainage (amount, consistency, odor) No odor or drainage Periwound: Intact skin surrounding Dressing procedure/placement/frequency: Foam dressing to promote healing.  Discussed plan of care with patient and they verbalize understanding. Please re-consult if further assistance is needed.  Thank-you,  Cammie Mcgeeawn Alyssah Algeo MSN, RN, CWOCN, [place]CN-AP, CNS [phone]

## 2015-11-12 NOTE — ED Notes (Signed)
Pt arrives via EMS from home. Pt states that around 9/10p she started "feeling funny." Pt cannot elaborate and will not give any other details. Pt denies pain anywhere and cannot pinpoint where the "funny feeling" is.

## 2015-11-12 NOTE — Progress Notes (Signed)
Pt felt urge to urinate, RN placed on bedside commode- pt unable to pee. Bladder scan revealed 135 mL in the bladder. Will continue to monitor.  Edgardo RoysMcGrath, Aaro Meyers R

## 2015-11-12 NOTE — ED Provider Notes (Signed)
CSN: [id number]     Arrival date & time 11/12/15  0140 History   By signing my name below, I, Arlan OrganAshley Leger, attest that this documentation has been prepared under the direction and in the presence of Loren Raceravid Blanche Scovell, MD.  Electronically Signed: Arlan OrganAshley Leger, ED Scribe. 11/12/2015. 2:10 AM.   No chief complaint on file.  The history is provided by the patient. No language interpreter was used.    HPI Comments: [name] is a 81 y.o. female with a PMHx of CAD, HTN, hyperlipidemia, osteopenia, CHF, COPD, and MI who presents to the Emergency Department complaining of constant, ongoing generalized weakness x 1 day. Ongoing lightheadedness, back pain, decreased appetite, and nonproductive cough also reports onset 5:00 PM this evening. No aggravating or alleviating factors at this time. She denies any fever, chills, nausea, vomiting, diarrhea, chest pain, or shortness of breath. No palpitations. Pt uses 2 liters of oxygen at home.  PCP: Bennie PieriniMARTIN,[name], [name]    Past Medical History  Diagnosis Date  . Hypertension   . CAD (coronary artery disease) 5/96  . Edema   . Hyperlipidemia   . Chronic atrial fibrillation (HCC)     Coumadin therapy   . Osteopenia   . Renal insufficiency   . Closed right ankle fracture   . CHF (congestive heart failure) (HCC)   . LBP (low back pain)   . Arthritis   . Cataract   . COPD (chronic obstructive pulmonary disease) (HCC)     wears O2 at home as needed  . Esophageal stricture   . Esophageal dysmotility 09/2011    seen on barium esophagram.  . Carotid artery occlusion   . Myocardial infarction [facility](HCC)    Past Surgical History  Procedure Laterality Date  . Heart stent      X2  . Mole removed  2013    umbilicus  . Esophagogastroduodenoscopy  2012, 05/2013,09/2013    dilation of esophagus and stricture in 09/2011, 05/2013.  meat disimpaction 09/2013  . Esophagogastroduodenoscopy N/A 10/18/2013    Procedure: ESOPHAGOGASTRODUODENOSCOPY (EGD);   Surgeon: Meryl DareMalcolm T Stark, MD;  [facility];  Service: Endoscopy;  Laterality: N/A;  possible food impaction   Family History  Problem Relation Age of Onset  . Colon cancer Neg Hx   . Esophageal cancer Neg Hx   . Stomach cancer Neg Hx   . Rectal cancer Neg Hx   . Hyperlipidemia Mother   . Hypertension Mother   . Hyperlipidemia Father   . Heart disease Father     After age 79  . Cancer Sister     Lung  . Hyperlipidemia Sister   . Hypertension Sister    Social History  Substance Use Topics  . Smoking status: Former Smoker -- 0.50 packs/day    Types: Cigarettes  . Smokeless tobacco: Never Used     Comment: pt quit with last hospitalization  . Alcohol Use: No     Comment: previous   OB History    No data available     Review of Systems  Constitutional: Positive for appetite change and fatigue. Negative for fever and chills.  Respiratory: Positive for cough. Negative for shortness of breath and wheezing.   Cardiovascular: Positive for leg swelling. Negative for chest pain and palpitations.  Gastrointestinal: Negative for nausea, vomiting, abdominal pain and diarrhea.  Genitourinary: Negative for dysuria, frequency and flank pain.  Musculoskeletal: Positive for back pain.  Skin: Negative for rash and wound.  Neurological: Positive for weakness (generalized)  and light-headedness. Negative for dizziness, numbness and headaches.  Psychiatric/Behavioral: Negative for confusion.  All other systems reviewed and are negative.     Allergies  Review of patient's allergies indicates no known allergies.  Home Medications   Prior to Admission medications   Medication Sig Start Date End Date Taking? Authorizing Provider  alendronate (FOSAMAX) 70 MG tablet Take 1 tablet (70 mg total) by mouth once a week. Take with a full glass of water on an empty stomach. 09/16/15  Yes Mary-Margaret Daphine Deutscher, [name]  atorvastatin (LIPITOR) 40 MG tablet TAKE 1 TABLET (40 MG TOTAL) BY MOUTH  DAILY. 09/16/15  Yes Mary-Margaret Daphine Deutscher, [name]  busPIRone (BUSPAR) 7.5 MG tablet Take 1 tablet (7.5 mg total) by mouth 2 (two) times daily. . 09/24/15  Yes Mechele Claude, MD  citalopram (CELEXA) 20 MG tablet Take 1 tablet (20 mg total) by mouth daily. 10/14/15  Yes Catarina Hartshorn, MD  dexlansoprazole (DEXILANT) 60 MG capsule TAKE 1 CAPSULE (60 MG TOTAL) BY MOUTH DAILY. 09/16/15  Yes Mary-Margaret Daphine Deutscher, [name]  diltiazem (CARDIZEM CD) 360 MG 24 hr capsule Take 1 capsule (360 mg total) by mouth daily. 10/14/15  Yes Catarina Hartshorn, MD  fluticasone (FLONASE) 50 MCG/ACT nasal spray Place 2 sprays into both nostrils daily. 10/14/15  Yes Oneta Sigman Tat, MD  Fluticasone-Salmeterol (ADVAIR DISKUS) 250-50 MCG/DOSE AEPB Inhale 1 puff into the lungs 2 (two) times daily. 06/16/15  Yes Mary-Margaret Daphine Deutscher, [name]  furosemide (LASIX) 20 MG tablet Take 40 mg by mouth daily.   Yes Historical Provider, MD  glucose blood (ONETOUCH VERIO) test strip Test 1x per day and prn   Dx- e11.9 10/17/15  Yes Mary-Margaret Daphine Deutscher, [name]  HYDROcodone-acetaminophen (NORCO/VICODIN) 5-325 MG per tablet Take 1 tablet by mouth 2 (two) times daily as needed for moderate pain. Patient taking differently: Take 1 tablet by mouth every 6 (six) hours as needed for moderate pain.  09/10/15  Yes Mary-Margaret Daphine Deutscher, [name]  Lancets [facility] ULTRASOFT) lancets Use as instructed 10/23/15  Yes Mary-Margaret Daphine Deutscher, [name]  loratadine (CLARITIN) 10 MG tablet Take 1 tablet (10 mg total) by mouth daily. 10/14/15  Yes Catarina Hartshorn, MD  metFORMIN (GLUMETZA) 500 MG (MOD) 24 hr tablet Take 500 mg by mouth daily with breakfast.   Yes Historical Provider, MD  metoprolol (LOPRESSOR) 100 MG tablet Take 1 tablet (100 mg total) by mouth 2 (two) times daily. 10/06/15  Yes Mary-Margaret Daphine Deutscher, [name]  nitroGLYCERIN (NITROSTAT) 0.4 MG SL tablet Place 1 tablet (0.4 mg total) under the tongue every 5 (five) minutes as needed for chest pain. 04/10/14  Yes Rollene Rotunda, MD  potassium chloride SA  (K-DUR,KLOR-CON) 20 MEQ tablet Take 20 mEq by mouth daily.   Yes Historical Provider, MD  tiotropium (SPIRIVA HANDIHALER) 18 MCG inhalation capsule PLACE 1 CAPSULE (18 MCG TOTAL) INTO INHALER AND INHALE DAILY. 09/16/15  Yes Mary-Margaret Daphine Deutscher, [name]  warfarin (COUMADIN) 5 MG tablet Take 0.5-1 tablets (2.5-5 mg total) by mouth See admin instructions. Takes 1 tablet ( ) every Tuesdays and Fridays and 1/2 tablet (2.5 mg) all other days. Patient taking differently: Take 2.5-5 mg by mouth See admin instructions. Takes 1 tablet ( ) every Tuesdays, Thursday and Saturday. 1/2 tablet (2.5 mg) all other days. 09/16/15  Yes Mary-Margaret Daphine Deutscher, [name]   Triage Vitals: BP 115/69 mmHg  Pulse 37  Temp(Src) 98.4 F (36.9 C) (Oral)  Resp 16  Ht 5' (1.524 m)  Wt 156 lb (70.761 kg)  BMI 30.47 kg/m2  SpO2 97%  LMP 03/27/1983   Physical  Exam  Constitutional: She is oriented to person, place, and time. She appears well-developed and well-nourished. No distress.  Frail-appearing  HENT:  Head: Normocephalic and atraumatic.  Dry tongue  Eyes: EOM are normal. Pupils are equal, round, and reactive to light.  Neck: Normal range of motion. Neck supple. No JVD present.  Cardiovascular: Normal rate and regular rhythm.  Exam reveals no gallop and no friction rub.   No murmur heard. Pulmonary/Chest: Effort normal. No respiratory distress. She has no wheezes. She has rales. She exhibits no tenderness.  Few scattered rhonchi bilateral bases.  Abdominal: Soft. Bowel sounds are normal. She exhibits no distension and no mass. There is no tenderness. There is no rebound and no guarding.  Musculoskeletal: Normal range of motion. She exhibits edema. She exhibits no tenderness.  Bilateral lower extremity pitting edema. Compression stockings in place  Neurological: She is alert and oriented to person, place, and time.  5/5 motor in all extremities. Sensation is fully intact. Patient is alert and oriented. Conversant  Skin:  Skin is warm and dry. No rash noted. No erythema.  Psychiatric: She has a normal mood and affect. Her behavior is normal.  Nursing note and vitals reviewed.   ED Course  Procedures (including critical care time)  DIAGNOSTIC STUDIES: Oxygen Saturation is 97% on RA, Normal by my interpretation.    COORDINATION OF CARE: 2:02 AM- Will order EKG. Discussed treatment plan with pt at bedside and pt agreed to plan.     Labs Review Labs Reviewed  CBC WITH DIFFERENTIAL/PLATELET - Abnormal; Notable for the following:    RBC 3.83 (*)    Hemoglobin 11.4 (*)    Monocytes Absolute 1.1 (*)    All other components within normal limits  COMPREHENSIVE METABOLIC PANEL - Abnormal; Notable for the following:    Chloride 98 (*)    Glucose, Bld 100 (*)    BUN 33 (*)    Creatinine, Ser 2.60 (*)    Calcium 8.5 (*)    Total Protein 4.9 (*)    Albumin 2.7 (*)    ALT 13 (*)    GFR calc non Af Amer 16 (*)    GFR calc Af Amer 19 (*)    All other components within normal limits  BRAIN NATRIURETIC PEPTIDE - Abnormal; Notable for the following:    B Natriuretic Peptide 686.4 (*)    All other components within normal limits  PROTIME-INR - Abnormal; Notable for the following:    Prothrombin Time 29.1 (*)    INR 2.81 (*)    All other components within normal limits  CULTURE, BLOOD (ROUTINE X 2)  CULTURE, BLOOD (ROUTINE X 2)  URINALYSIS, ROUTINE W REFLEX MICROSCOPIC (NOT AT [facility])  CREATININE, URINE, RANDOM  UREA NITROGEN, URINE  I-STAT CG4 LACTIC ACID, ED  Rosezena Sensor, ED    Imaging Review Dg Chest Port 1 View  11/12/2015  CLINICAL DATA:  Acute onset of cough and shortness of breath. Initial encounter. EXAM: PORTABLE CHEST 1 VIEW COMPARISON:  Chest radiograph performed 10/26/2015 FINDINGS: The lungs are well-aerated. Vascular congestion is noted, with increased interstitial markings, concerning for mild interstitial edema. Underlying atelectasis is noted. There is no evidence of pleural effusion  or pneumothorax. The cardiomediastinal silhouette is mildly enlarged. No acute osseous abnormalities are seen. IMPRESSION: Vascular congestion and mild cardiomegaly, with increased interstitial markings, concerning for mild interstitial edema. Underlying atelectasis noted. Electronically Signed   By: Roanna Raider M.D.   On: 11/12/2015 02:44   I have  personally reviewed and evaluated these images and lab results as part of my medical decision-making.   EKG Interpretation   Date/Time:  Wednesday November 12 2015 01:53:48 EST Ventricular Rate:  129 PR Interval:    QRS Duration: 154 QT Interval:  393 QTC Calculation: 576 R Axis:   -42 Text Interpretation:  Atrial fibrillation LVH with IVCD, LAD and secondary  repol abnrm Inferior infarct, old Prolonged QT interval Confirmed by  Ranae Palms  MD, Disa Riedlinger ([id number]) on 11/12/2015 2:35:32 AM      MDM   Final diagnoses:  Dehydration  Atrial fibrillation with rapid ventricular response (HCC)  Renal insufficiency  Peripheral edema    I personally performed the services described in this documentation, which was scribed in my presence. The recorded information has been reviewed and is accurate.   Patient presents with generalized weakness and dizziness for 1 day. She is tachycardic in atrial fibrillation. Blood pressure initially low but is improved with a gentle IV fluids. Patient has evidence of dehydration with dry tongue and elevation in creatinine. She also has evidence of CHF with pulmonary edema and lower extremity edema. Recently had torsemide dosing increased to 60 mg per day due to lower extremity swelling. That dose was cut back to 40 mg this week. We'll discuss with hospitalist about admission.  Loren Racer, MD 11/12/15 [phone]

## 2015-11-12 NOTE — Progress Notes (Signed)

## 2015-11-13 ENCOUNTER — Observation Stay (HOSPITAL_COMMUNITY): Payer: Medicare Other

## 2015-11-13 ENCOUNTER — Encounter (HOSPITAL_COMMUNITY): Payer: Self-pay | Admitting: Physician Assistant

## 2015-11-13 DIAGNOSIS — E785 Hyperlipidemia, unspecified: Secondary | ICD-10-CM | POA: Diagnosis present

## 2015-11-13 DIAGNOSIS — I480 Paroxysmal atrial fibrillation: Secondary | ICD-10-CM

## 2015-11-13 DIAGNOSIS — Z955 Presence of coronary angioplasty implant and graft: Secondary | ICD-10-CM | POA: Diagnosis not present

## 2015-11-13 DIAGNOSIS — Z7901 Long term (current) use of anticoagulants: Secondary | ICD-10-CM

## 2015-11-13 DIAGNOSIS — J449 Chronic obstructive pulmonary disease, unspecified: Secondary | ICD-10-CM | POA: Diagnosis present

## 2015-11-13 DIAGNOSIS — Z66 Do not resuscitate: Secondary | ICD-10-CM | POA: Diagnosis present

## 2015-11-13 DIAGNOSIS — Z9981 Dependence on supplemental oxygen: Secondary | ICD-10-CM | POA: Diagnosis not present

## 2015-11-13 DIAGNOSIS — I2510 Atherosclerotic heart disease of native coronary artery without angina pectoris: Secondary | ICD-10-CM | POA: Diagnosis not present

## 2015-11-13 DIAGNOSIS — I10 Essential (primary) hypertension: Secondary | ICD-10-CM | POA: Diagnosis not present

## 2015-11-13 DIAGNOSIS — Z87891 Personal history of nicotine dependence: Secondary | ICD-10-CM | POA: Diagnosis not present

## 2015-11-13 DIAGNOSIS — R531 Weakness: Secondary | ICD-10-CM | POA: Diagnosis not present

## 2015-11-13 DIAGNOSIS — N184 Chronic kidney disease, stage 4 (severe): Secondary | ICD-10-CM | POA: Diagnosis present

## 2015-11-13 DIAGNOSIS — J9621 Acute and chronic respiratory failure with hypoxia: Secondary | ICD-10-CM | POA: Diagnosis present

## 2015-11-13 DIAGNOSIS — Y95 Nosocomial condition: Secondary | ICD-10-CM | POA: Diagnosis present

## 2015-11-13 DIAGNOSIS — N179 Acute kidney failure, unspecified: Secondary | ICD-10-CM | POA: Diagnosis present

## 2015-11-13 DIAGNOSIS — R0602 Shortness of breath: Secondary | ICD-10-CM | POA: Diagnosis not present

## 2015-11-13 DIAGNOSIS — I6529 Occlusion and stenosis of unspecified carotid artery: Secondary | ICD-10-CM | POA: Diagnosis present

## 2015-11-13 DIAGNOSIS — E860 Dehydration: Secondary | ICD-10-CM | POA: Diagnosis present

## 2015-11-13 DIAGNOSIS — I5033 Acute on chronic diastolic (congestive) heart failure: Secondary | ICD-10-CM | POA: Diagnosis not present

## 2015-11-13 DIAGNOSIS — J069 Acute upper respiratory infection, unspecified: Secondary | ICD-10-CM | POA: Diagnosis present

## 2015-11-13 DIAGNOSIS — I252 Old myocardial infarction: Secondary | ICD-10-CM | POA: Diagnosis not present

## 2015-11-13 DIAGNOSIS — K219 Gastro-esophageal reflux disease without esophagitis: Secondary | ICD-10-CM | POA: Diagnosis present

## 2015-11-13 DIAGNOSIS — I482 Chronic atrial fibrillation: Secondary | ICD-10-CM | POA: Diagnosis present

## 2015-11-13 DIAGNOSIS — E1122 Type 2 diabetes mellitus with diabetic chronic kidney disease: Secondary | ICD-10-CM | POA: Diagnosis present

## 2015-11-13 DIAGNOSIS — F418 Other specified anxiety disorders: Secondary | ICD-10-CM | POA: Diagnosis present

## 2015-11-13 DIAGNOSIS — I481 Persistent atrial fibrillation: Secondary | ICD-10-CM | POA: Diagnosis not present

## 2015-11-13 DIAGNOSIS — I130 Hypertensive heart and chronic kidney disease with heart failure and stage 1 through stage 4 chronic kidney disease, or unspecified chronic kidney disease: Secondary | ICD-10-CM | POA: Diagnosis present

## 2015-11-13 LAB — GLUCOSE, CAPILLARY
Glucose-Capillary: 128 mg/dL — ABNORMAL HIGH (ref 65–99)
Glucose-Capillary: 203 mg/dL — ABNORMAL HIGH (ref 65–99)
Glucose-Capillary: 194 mg/dL — ABNORMAL HIGH (ref 65–99)
Glucose-Capillary: 131 mg/dL — ABNORMAL HIGH (ref 65–99)

## 2015-11-13 LAB — CREATININE, URINE, RANDOM: Creatinine, Urine: 91.51 mg/dL

## 2015-11-13 LAB — BASIC METABOLIC PANEL
Anion gap: 7 (ref 5–15)
BUN: 31 mg/dL — ABNORMAL HIGH (ref 6–20)
CO2: 29 mmol/L (ref 22–32)
Calcium: 8.6 mg/dL — ABNORMAL LOW (ref 8.9–10.3)
Chloride: 99 mmol/L — ABNORMAL LOW (ref 101–111)
Creatinine, Ser: 2.09 mg/dL — ABNORMAL HIGH (ref 0.44–1.00)
GFR calc Af Amer: 25 mL/min — ABNORMAL LOW (ref >60)
GFR calc non Af Amer: 21 mL/min — ABNORMAL LOW (ref >60)
Glucose, Bld: 124 mg/dL — ABNORMAL HIGH (ref 65–99)
Potassium: 4.7 mmol/L (ref 3.5–5.1)
Sodium: 135 mmol/L (ref 135–145)

## 2015-11-13 LAB — URINALYSIS, ROUTINE W REFLEX MICROSCOPIC
Bilirubin Urine: NEGATIVE
Glucose, UA: NEGATIVE mg/dL
Hgb urine dipstick: NEGATIVE
Ketones, ur: NEGATIVE mg/dL
Leukocytes, UA: NEGATIVE
Nitrite: NEGATIVE
Protein, ur: NEGATIVE mg/dL
Specific Gravity, Urine: 1.015 (ref 1.005–1.030)
pH: 5 (ref 5.0–8.0)

## 2015-11-13 LAB — BRAIN NATRIURETIC PEPTIDE: B Natriuretic Peptide: 882.8 pg/mL — ABNORMAL HIGH (ref 0.0–100.0)

## 2015-11-13 LAB — PROTIME-INR
INR: 2.25 — ABNORMAL HIGH (ref 0.00–1.49)
Prothrombin Time: 24.6 seconds — ABNORMAL HIGH (ref 11.6–15.2)

## 2015-11-13 MED ORDER — NITROGLYCERIN 2 % TD OINT
0.5000 [in_us] | TOPICAL_OINTMENT | Freq: Four times a day (QID) | TRANSDERMAL | Status: DC
  Administered 2015-11-13 – 2015-11-15 (×9): 0.5 [in_us] via TOPICAL
  Filled 2015-11-13: qty 30

## 2015-11-13 MED ORDER — FUROSEMIDE 10 MG/ML IJ SOLN
INTRAMUSCULAR | Status: AC
  Filled 2015-11-13: qty 4

## 2015-11-13 MED ORDER — ALBUTEROL SULFATE (2.5 MG/3ML) 0.083% IN NEBU
2.5000 mg | INHALATION_SOLUTION | Freq: Four times a day (QID) | RESPIRATORY_TRACT | Status: DC | PRN
  Administered 2015-11-13 – 2015-11-17 (×3): 2.5 mg via RESPIRATORY_TRACT
  Filled 2015-11-13 (×2): qty 3

## 2015-11-13 MED ORDER — LEVOFLOXACIN 750 MG PO TABS
750.0000 mg | ORAL_TABLET | ORAL | Status: DC
  Administered 2015-11-13: 750 mg via ORAL
  Filled 2015-11-13: qty 1

## 2015-11-13 MED ORDER — METHYLPREDNISOLONE SODIUM SUCC 125 MG IJ SOLR
60.0000 mg | INTRAMUSCULAR | Status: AC
  Administered 2015-11-13: 60 mg via INTRAVENOUS
  Filled 2015-11-13: qty 2

## 2015-11-13 MED ORDER — FUROSEMIDE 10 MG/ML IJ SOLN
40.0000 mg | Freq: Once | INTRAMUSCULAR | Status: AC
  Administered 2015-11-13: 40 mg via INTRAVENOUS

## 2015-11-13 MED ORDER — ALBUTEROL SULFATE (2.5 MG/3ML) 0.083% IN NEBU
2.5000 mg | INHALATION_SOLUTION | RESPIRATORY_TRACT | Status: AC
  Administered 2015-11-13: 2.5 mg via RESPIRATORY_TRACT
  Filled 2015-11-13: qty 3

## 2015-11-13 MED ORDER — ALBUTEROL SULFATE (2.5 MG/3ML) 0.083% IN NEBU
INHALATION_SOLUTION | RESPIRATORY_TRACT | Status: AC
  Filled 2015-11-13: qty 3

## 2015-11-13 MED ORDER — IPRATROPIUM BROMIDE 0.02 % IN SOLN
0.5000 mg | Freq: Once | RESPIRATORY_TRACT | Status: AC
  Administered 2015-11-13: 0.5 mg via RESPIRATORY_TRACT
  Filled 2015-11-13: qty 2.5

## 2015-11-13 MED ORDER — FUROSEMIDE 10 MG/ML IJ SOLN
40.0000 mg | Freq: Two times a day (BID) | INTRAMUSCULAR | Status: DC
  Administered 2015-11-13 – 2015-11-14 (×3): 40 mg via INTRAVENOUS
  Filled 2015-11-13 (×3): qty 4

## 2015-11-13 NOTE — Evaluation (Signed)
Occupational Therapy Evaluation Patient Details Name: [name] MRN: [id number] DOB: 24-Jan-1933 Today's Date: 11/13/2015    History of Present Illness Patient is a 81 y/o female presents with generalized weakness and dehydration, found to be in A Fib with RVR. PMH includes COPD on 2L 02 at home, CAD s/p PCI x2, A-fib on warfarin, CKD stage IV, chronic diastolic CHF, and smoking.   Clinical Impression   Patient presenting with decreased ADL and functional mobility independence secondary to above. Patient required some assistance for ADLs from niece and/or nephew PTA. Patient currently functioning at an overall min to mod assist level. Patient will benefit from acute OT to increase overall independence in the areas of ADLs, functional mobility, education on energy conservation, and overall safety in order to safely discharge to venue listed below.   According to patient, her niece and nephew check on her throughout the day and she has a HHaide and HHPT. At this time, am recommending 24/7 supervision/assistance as patient requires min to mod assist for tasks. Pt with history of falls and will benefit from ST SNF to maximize her overall independence and safety.     Follow Up Recommendations  SNF;Supervision/Assistance - 24 hour    Equipment Recommendations  3 in 1 bedside comode    Recommendations for Other Services  None at this time    Precautions / Restrictions Precautions Precautions: Fall Restrictions Weight Bearing Restrictions: No    Mobility Bed Mobility General bed mobility comments: Pt found seated in recliner upon OT entering/exiting room. See PT note for more information  Transfers Overall transfer level: Needs assistance Equipment used: Rolling walker (2 wheeled) Transfers: Sit to/from Stand Sit to Stand: Min assist General transfer comment: Min assist to power up into standing. Cues for hand placment and overall safety.     Balance Overall balance assessment:  Needs assistance Sitting-balance support: No upper extremity supported;Feet supported Sitting balance-Leahy Scale: Fair     Standing balance support: Bilateral upper extremity supported;During functional activity Standing balance-Leahy Scale: Poor    ADL Overall ADL's : Needs assistance/impaired Eating/Feeding: Set up;Sitting   Grooming: Set up;Sitting   Upper Body Bathing: Minimal assitance;Sitting   Lower Body Bathing: Moderate assistance;Sit to/from stand   Upper Body Dressing : Minimal assistance;Sitting   Lower Body Dressing: Moderate assistance;Sit to/from stand   Toilet Transfer: Minimal assistance;RW;BSC   Toileting- Clothing Manipulation and Hygiene: Minimal assistance;Sit to/from stand       Functional mobility during ADLs: Minimal assistance;Rolling walker;Cueing for safety General ADL Comments: Pt limited by decreased cardiopulmonary support. Patient on 6 L/min supplemental 02 upon OT entering room, sats=94%. Pt reports she normally is on 2L/min at home. Therapist decreased supplemental 02 to 3L/min. Pt stood from recliner took a couple steps forward, then back. 02 sats decreased to 84% on 3L/min. Pt sat in recliner and therapist encouraged rest break and pursed lip breathing. Increased supplemental 02 to 6L/min as from before and sats increased to 91% within ~2 minutes.     Pertinent Vitals/Pain Pain Assessment: Faces Faces Pain Scale: Hurts a little bit Pain Location: back Pain Descriptors / Indicators: Aching Pain Intervention(s): Monitored during session     Hand Dominance Right   Extremity/Trunk Assessment Upper Extremity Assessment Upper Extremity Assessment: Generalized weakness   Lower Extremity Assessment Lower Extremity Assessment: Generalized weakness       Communication Communication Communication: No difficulties   Cognition Arousal/Alertness: Awake/alert Behavior During Therapy: WFL for tasks assessed/performed Overall Cognitive Status:  Within Functional Limits for  tasks assessed              Home Living Family/patient expects to be discharged to:: Private residence Living Arrangements: Alone Available Help at Discharge: Family;Available PRN/intermittently (niece and nephew that live behind her) Type of Home: House Home Access: Level entry   Entrance Stairs-Rails: None Home Layout: One level     Bathroom Shower/Tub: Tub/shower unit;Curtain   Bathroom Toilet: Handicapped height     Home Equipment: Environmental consultantWalker - 2 wheels;Cane - single point;Tub bench;Hospital bed;Walker - 4 wheels   Additional Comments: From PT's note, pt has a BSC. Per pt report she does not have a BSC.       Prior Functioning/Environment Level of Independence: Independent with assistive device(s)  Comments: Uses rollator for all ambulation at baseline. Reports falls. Assist needed for grocery shopping, IADls.    OT Diagnosis: Generalized weakness;Acute pain   OT Problem List: Decreased strength;Decreased activity tolerance;Impaired balance (sitting and/or standing);Decreased knowledge of use of DME or AE;Pain;Cardiopulmonary status limiting activity   OT Treatment/Interventions: Self-care/ADL training;Energy conservation;DME and/or AE instruction;Therapeutic activities;Patient/family education;Balance training    OT Goals(Current goals can be found in the care plan section) Acute Rehab OT Goals Patient Stated Goal: go home, "I don't want anyone staying with me all the time" OT Goal Formulation: With patient Time For Goal Achievement: 11/27/15 Potential to Achieve Goals: Good ADL Goals Pt Will Perform Grooming: with supervision;standing Pt Will Transfer to Toilet: with supervision;bedside commode;ambulating Additional ADL Goal #1: Pt will be supervision using RW/rollator for functional mobility Additional ADL Goal #2: Pt will be educated on energy conservation techniques/strategies and be able to verbalize at least 3 techniques   OT  Frequency: Min 2X/week   Barriers to D/C: Decreased caregiver support   End of Session Equipment Utilized During Treatment: Rolling walker;Oxygen  Activity Tolerance: Patient tolerated treatment well Patient left: in chair;with call bell/phone within reach   Time: 0814-0830 OT Time Calculation (min): 16 min Charges:  OT General Charges $OT Visit: 1 Procedure OT Evaluation $Initial OT Evaluation Tier I: 1 Procedure G-Codes: OT G-codes **NOT FOR INPATIENT CLASS** Functional Limitation: Self care Self Care Current Status ([id number]): At least 20 percent but less than 40 percent impaired, limited or restricted Self Care Goal Status (A2130(G8988): At least 1 percent but less than 20 percent impaired, limited or restricted  Mathew Postiglione , MS, OTR/L, CLT Pager: [phone]  11/13/2015, 8:41 AM

## 2015-11-13 NOTE — [place] FL2 (Signed)
[place] MEDICA[place] FL2 LEVEL OF CARE SCREENING TOOL     IDENTIFICATION  Patient Name: Katie FergusonDessie A Melendez Birthdate: 1933-10-17 Sex: female Admission Date (Current Location): 11/12/2015  [place]   Facility and Address:  [facility], [address], [place], [place] [address]      Provider Number: [id number]  Attending Physician Name and Address:  Leroy SeaPrashant K Singh, MD  Relative Name and Phone Number:       Current Level of Care: Hospital Recommended Level of Care: Skilled Nursing Facility Prior Approval Number:    Date Approved/Denied:   PASRR Number: [id number]  Discharge Plan: SNF    Current Diagnoses: Patient Active Problem List   Diagnosis Date Noted  . Generalized weakness 11/12/2015  . Cellulitis of right leg 11/12/2015  . Dehydration 11/12/2015  . Depression with anxiety 11/12/2015  . Leg wound, right 11/05/2015  . Diabetes mellitus without complication (HCC) 10/16/2015  . Hyperkalemia 10/13/2015  . Acute on chronic respiratory failure with hypoxia (HCC) 10/09/2015  . Chronic diastolic CHF (congestive heart failure) (HCC) 10/09/2015  . COPD (chronic obstructive pulmonary disease) (HCC) 09/17/2015  . Acute renal failure superimposed on stage 4 chronic kidney disease (HCC) 09/17/2015  . BMI 30.0-30.9,adult 09/16/2015  . Acute encephalopathy 09/16/2015  . Angina at rest [facility](HCC) 07/29/2015  . Hypothyroidism 10/28/2014  . Gastroesophageal reflux disease without esophagitis 10/28/2014  . GAD (generalized anxiety disorder) 10/28/2014  . Osteoporosis 10/28/2014  . Essential hypertension, benign 03/26/2013  . CAD (coronary artery disease), native coronary artery 03/26/2013  . Hyperlipidemia with target LDL less than 100 03/26/2013  . Atrial fibrillation (HCC) 03/26/2013  . Chronic low back pain 03/26/2013  . Occlusion and stenosis of carotid artery without mention of cerebral infarction 06/08/2012     Orientation ACTIVITIES/SOCIAL BLADDER RESPIRATION    Self, Time    Continent    BEHAVIORAL SYMPTOMS/MOOD NEUROLOGICAL BOWEL NUTRITION STATUS      Continent    PHYSICIAN VISITS COMMUNICATION OF NEEDS Height & Weight Skin    Verbally 5' (152.4 cm) 163 lbs.            AMBULATORY STATUS RESPIRATION    Assist extensive (min to mod assist)        Personal Care Assistance Level of Assistance  Bathing, Dressing Bathing Assistance: Limited assistance   Dressing Assistance: Limited assistance      Functional Limitations Info                SPECIAL CARE FACTORS FREQUENCY  OT (By licensed OT), PT (By licensed PT)                   Additional Factors Info  Code Status, Allergies, Insulin Sliding Scale Code Status Info: DNR Allergies Info: NKA   Insulin Sliding Scale Info: sliding scale- please see dc summary for details       Current Medications (11/13/2015): Current Facility-Administered Medications  Medication Dose Route Frequency Provider Last Rate Last Dose  . acetaminophen (TYLENOL) tablet 650 mg  650 mg Oral Q6H PRN Lorretta HarpXilin Niu, MD   650 mg at 11/12/15 [id number]   Or  . acetaminophen (TYLENOL) suppository 650 mg  650 mg Rectal Q6H PRN Lorretta HarpXilin Niu, MD      . atorvastatin (LIPITOR) tablet 40 mg  40 mg Oral q1800 Lorretta HarpXilin Niu, MD   40 mg at 11/12/15 1918  . busPIRone (BUSPAR) tablet 7.5 mg  7.5 mg Oral BID Lorretta HarpXilin Niu, MD   7.5  mg at 11/12/15 2147  . diltiazem (CARDIZEM CD) 24 hr capsule 360 mg  360 mg Oral Daily Lorretta Harp, MD   360 mg at 11/12/15 0656  . fluticasone (FLONASE) 50 MCG/ACT nasal spray 2 spray  2 spray Each Nare Daily Lorretta Harp, MD   2 spray at 11/12/15 2000  . furosemide (LASIX) 10 MG/ML injection           . HYDROcodone-acetaminophen (NORCO/VICODIN) 5-325 MG per tablet 1 tablet  1 tablet Oral Q6H PRN Lorretta Harp, MD      . insulin aspart (novoLOG) injection 0-9 Units  0-9 Units Subcutaneous TID WC Lorretta Harp, MD   1 Units at 11/13/15 0720  . loratadine  (CLARITIN) tablet 10 mg  10 mg Oral Daily Lorretta Harp, MD   10 mg at 11/12/15 1030  . metoprolol (LOPRESSOR) injection 5 mg  5 mg Intravenous Q4H PRN Leroy Sea, MD   5 mg at 11/12/15 0809  . metoprolol tartrate (LOPRESSOR) tablet 100 mg  100 mg Oral BID Lorretta Harp, MD   100 mg at 11/12/15 2146  . mometasone-formoterol (DULERA) 100-5 MCG/ACT inhaler 2 puff  2 puff Inhalation BID Lorretta Harp, MD   2 puff at 11/13/15 0800  . nitroGLYCERIN (NITROGLYN) 2 % ointment 0.5 inch  0.5 inch Topical 4 times per day Leroy Sea, MD   0.5 inch at 11/13/15 0640  . nitroGLYCERIN (NITROSTAT) SL tablet 0.4 mg  0.4 mg Sublingual Q5 min PRN Lorretta Harp, MD      . pantoprazole (PROTONIX) EC tablet 40 mg  40 mg Oral Daily Lorretta Harp, MD   40 mg at 11/12/15 1030  . sodium chloride 0.9 % injection 3 mL  3 mL Intravenous Q12H Lorretta Harp, MD   3 mL at 11/12/15 1147  . tiotropium (SPIRIVA) inhalation capsule 18 mcg  18 mcg Inhalation Daily Lorretta Harp, MD   18 mcg at 11/13/15 0800  . warfarin (COUMADIN) tablet 5 mg  5 mg Oral Q T,Th,Sat-1800 Loren Racer, MD       And  . warfarin (COUMADIN) tablet 2.5 mg  2.5 mg Oral Q M,W,F,Su-1800 Loren Racer, MD   2.5 mg at 11/12/15 1918  . Warfarin - Pharmacist Dosing Inpatient   Does not apply q1800 Loren Racer, MD       Do not use this list as official medication orders. Please verify with discharge summary.  Discharge Medications:   Medication List    ASK your doctor about these medications        alendronate 70 MG tablet  Commonly known as:  FOSAMAX  Take 1 tablet (70 mg total) by mouth once a week. Take with a full glass of water on an empty stomach.     atorvastatin 40 MG tablet  Commonly known as:  LIPITOR  TAKE 1 TABLET (40 MG TOTAL) BY MOUTH DAILY.     busPIRone 7.5 MG tablet  Commonly known as:  BUSPAR  Take 1 tablet (7.5 mg total) by mouth 2 (two) times daily. .     citalopram 20 MG tablet  Commonly known as:  CELEXA  Take 1 tablet (20 mg total) by  mouth daily.     dexlansoprazole 60 MG capsule  Commonly known as:  DEXILANT  TAKE 1 CAPSULE (60 MG TOTAL) BY MOUTH DAILY.     diltiazem 360 MG 24 hr capsule  Commonly known as:  CARDIZEM CD  Take 1 capsule (360 mg total) by mouth daily.  fluticasone 50 MCG/ACT nasal spray  Commonly known as:  FLONASE  Place 2 sprays into both nostrils daily.     Fluticasone-Salmeterol 250-50 MCG/DOSE Aepb  Commonly known as:  ADVAIR DISKUS  Inhale 1 puff into the lungs 2 (two) times daily.     glucose blood test strip  Commonly known as:  ONETOUCH VERIO  Test 1x per day and prn   Dx- e11.9     HYDROcodone-acetaminophen 5-325 MG tablet  Commonly known as:  NORCO/VICODIN  Take 1 tablet by mouth 2 (two) times daily as needed for moderate pain.     loratadine 10 MG tablet  Commonly known as:  CLARITIN  Take 1 tablet (10 mg total) by mouth daily.     metFORMIN 500 MG (MOD) 24 hr tablet  Commonly known as:  GLUMETZA  Take 500 mg by mouth daily with breakfast.     metoprolol 100 MG tablet  Commonly known as:  LOPRESSOR  Take 1 tablet (100 mg total) by mouth 2 (two) times daily.     nitroGLYCERIN 0.4 MG SL tablet  Commonly known as:  NITROSTAT  Place 1 tablet (0.4 mg total) under the tongue every 5 (five) minutes as needed for chest pain.     onetouch ultrasoft lancets  Use as instructed     potassium chloride SA 20 MEQ tablet  Commonly known as:  K-DUR,KLOR-CON  Take 20 mEq by mouth daily.     tiotropium 18 MCG inhalation capsule  Commonly known as:  SPIRIVA HANDIHALER  PLACE 1 CAPSULE (18 MCG TOTAL) INTO INHALER AND INHALE DAILY.     torsemide 20 MG tablet  Commonly known as:  DEMADEX  Take 40 mg by mouth daily.     warfarin 5 MG tablet  Commonly known as:  COUMADIN  Take 0.5-1 tablets (2.5-5 mg total) by mouth See admin instructions. Takes 1 tablet ( ) every Tuesdays and Fridays and 1/2 tablet (2.5 mg) all other days.        Relevant Imaging Results:  Relevant Lab  Results:  Recent Labs SSN [id number]  Additional Information    Rondel Baton, LCSW

## 2015-11-13 NOTE — Progress Notes (Signed)
Craige CottaKirby, PA called back and ordered non-rebreather for one hour and then RN will attempt to wean. Pt tolerating well and breathing pattern returning to baseline.

## 2015-11-13 NOTE — Consult Note (Signed)
CARDIOLOGY CONSULT NOTE   Patient ID: [name] MRN: [id number] DOB/AGE: [birth year] 79 y.o.  Admit date: 11/12/2015  Primary Physician   Bennie Pierini, [name] Primary Cardiologist   Dr Antoine Poche Reason for Consultation   CHF  WUJ:WJXBJY [name] is a 81 y.o. year old female with a history of hypertension, hyperlipidemia, diabetes mellitus, COPD on 2 L oxygen at home, GERD, depression, anxiety, CAD, stent placement, atrial fibrillation on Coumadin, CKD-4, diastolic CHF, esophageal stricture,   Admitted 10/13-10/18 with Acute on chronic respiratory failure 2nd HCAP & COPD.   Weight at d/c 170, but trended down after that, with weight 156 lbs on 11/07 and same at this admission.  She was admitted 11/16 with generalized weakness. Hydrated for hypotension, weakness and ARF. Developed D-CHF and acute on chronic respiratory failure and cardiology asked to see. Weight up 7 lbs since admission at 163 lbs.   Pt has LE edema most of the time, but it is worse now than it has been. She has had no chest pain. Chronic orthopnea, no real change. Feels no real change in her breathing since admission. Not aware of rapid or irregular HR. Feels ready to go home. Coughs at times, rare dark yellow sputum.    Past Medical History  Diagnosis Date  . Hypertension   . CAD (coronary artery disease) 5/96  . Edema   . Hyperlipidemia   . Chronic atrial fibrillation (HCC)     Coumadin therapy   . Osteopenia   . Renal insufficiency   . Closed right ankle fracture   . CHF (congestive heart failure) (HCC)   . LBP (low back pain)   . Arthritis   . Cataract   . COPD (chronic obstructive pulmonary disease) (HCC)     wears O2 at home as needed  . Esophageal stricture   . Esophageal dysmotility 09/2011    seen on barium esophagram.  . Carotid artery occlusion   . Myocardial infarction [facility])     Past Surgical History  Procedure Laterality Date  . Heart stent      X2  . Mole removed  2013   umbilicus  . Esophagogastroduodenoscopy  2012, 05/2013,09/2013    dilation of esophagus and stricture in 09/2011, 05/2013.  meat disimpaction 09/2013  . Esophagogastroduodenoscopy N/A 10/18/2013    Procedure: ESOPHAGOGASTRODUODENOSCOPY (EGD);  Surgeon: Meryl Dare, MD;  Location: [facility] ENDOSCOPY;  Service: Endoscopy;  Laterality: N/A;  possible food impaction   No Known Allergies  I have reviewed the patient's current medications . atorvastatin  40 mg Oral q1800  . busPIRone  7.5 mg Oral BID  . diltiazem  360 mg Oral Daily  . fluticasone  2 spray Each Nare Daily  . furosemide      . furosemide  40 mg Intravenous BID  . insulin aspart  0-9 Units Subcutaneous TID WC  . levofloxacin  750 mg Oral Q48H  . loratadine  10 mg Oral Daily  . metoprolol  100 mg Oral BID  . mometasone-formoterol  2 puff Inhalation BID  . nitroGLYCERIN  0.5 inch Topical 4 times per day  . pantoprazole  40 mg Oral Daily  . sodium chloride  3 mL Intravenous Q12H  . tiotropium  18 mcg Inhalation Daily  . warfarin  5 mg Oral Q T,Th,Sat-1800   And  . warfarin  2.5 mg Oral Q M,W,F,Su-1800  . Warfarin - Pharmacist Dosing Inpatient   Does not apply q1800     acetaminophen **  OR** [DISCONTINUED] acetaminophen, HYDROcodone-acetaminophen, metoprolol, nitroGLYCERIN  Medication Sig  alendronate (FOSAMAX) 70 MG tablet Take 1 tablet (70 mg total) by mouth once a week. Take with a full glass of water on an empty stomach.  atorvastatin (LIPITOR) 40 MG tablet TAKE 1 TABLET (40 MG TOTAL) BY MOUTH DAILY.  busPIRone (BUSPAR) 7.5 MG tablet Take 1 tablet (7.5 mg total) by mouth 2 (two) times daily. .  citalopram (CELEXA) 20 MG tablet Take 1 tablet (20 mg total) by mouth daily.  dexlansoprazole (DEXILANT) 60 MG capsule TAKE 1 CAPSULE (60 MG TOTAL) BY MOUTH DAILY.  diltiazem (CARDIZEM CD) 360 MG 24 hr capsule Take 1 capsule (360 mg total) by mouth daily.  fluticasone (FLONASE) 50 MCG/ACT nasal spray Place 2 sprays into both nostrils  daily.  Fluticasone-Salmeterol (ADVAIR DISKUS) 250-50 MCG/DOSE AEPB Inhale 1 puff into the lungs 2 (two) times daily.  glucose blood (ONETOUCH VERIO) test strip Test 1x per day and prn   Dx- e11.9  HYDROcodone-acetaminophen (NORCO/VICODIN) 5-325 MG per tablet Take 1 tablet by mouth 2 (two) times daily as needed for moderate pain. Patient taking differently: Take 1 tablet by mouth every 6 (six) hours as needed for moderate pain.   loratadine (CLARITIN) 10 MG tablet Take 1 tablet (10 mg total) by mouth daily.  metFORMIN (GLUMETZA) 500 MG (MOD) 24 hr tablet Take 500 mg by mouth daily with breakfast.  metoprolol (LOPRESSOR) 100 MG tablet Take 1 tablet (100 mg total) by mouth 2 (two) times daily.  nitroGLYCERIN (NITROSTAT) 0.4 MG SL tablet Place 1 tablet (0.4 mg total) under the tongue every 5 (five) minutes as needed for chest pain.  potassium chloride SA (K-DUR,KLOR-CON) 20 MEQ tablet Take 20 mEq by mouth daily.  tiotropium (SPIRIVA HANDIHALER) 18 MCG inhalation capsule PLACE 1 CAPSULE (18 MCG TOTAL) INTO INHALER AND INHALE DAILY.  torsemide (DEMADEX) 20 MG tablet Take 40 mg by mouth daily.  warfarin (COUMADIN) 5 MG tablet Take 0.5-1 tablets (2.5-5 mg total) by mouth See admin instructions. Patient taking differently: Take 2.5-5 mg by mouth See admin instructions. Takes 1 tablet ( ) every Tuesdays, Thursday and Saturday. 1/2 tablet (2.5 mg) all other days.     Social History   Social History  . Marital Status: Widowed    Spouse Name: N/A  . Number of Children: N/A  . Years of Education: N/A   Occupational History  . retired    Social History Main Topics  . Smoking status: Former Smoker -- 1.00 packs/day    Types: Cigarettes    Quit date: 10/09/2015  . Smokeless tobacco: Never Used  . Alcohol Use: No     Comment: previous  . Drug Use: No  . Sexual Activity: Not on file   Other Topics Concern  . Not on file   Social History Narrative   Pt lives alone, niece and nephew live behind  her and help out.     Family Status  Relation Status Death Age  . Mother Deceased   . Father Deceased   . Sister Deceased   . Maternal Grandmother Deceased   . Maternal Grandfather Deceased   . Paternal Grandmother Deceased   . Paternal Grandfather Deceased    Family History  Problem Relation Age of Onset  . Colon cancer Neg Hx   . Esophageal cancer Neg Hx   . Stomach cancer Neg Hx   . Rectal cancer Neg Hx   . Hyperlipidemia Mother   . Hypertension Mother   . Hyperlipidemia Father   .  Heart disease Father     After age 82  . Cancer Sister     Lung  . Hyperlipidemia Sister   . Hypertension Sister      ROS:  Full 14 point review of systems complete and found to be negative unless listed above.  Physical Exam: Blood pressure 128/77, pulse 101, temperature 97.5 F (36.4 C), temperature source Axillary, resp. rate 18, height 5' (1.524 m), weight 163 lb 1.6 oz (73.982 kg), last menstrual period 03/27/1983, SpO2 93 %.  General: Well developed, well nourished, female in no acute distress on O2 Head: Eyes PERRLA, No xanthomas.   Normocephalic and atraumatic, oropharynx without edema or exudate. Dentition: poor Lungs: decreased BS bases w/ rales Heart: Heart irregular rate and rhythm with S1, S2, 2/6  murmur. pulses are 2+ extrem.   Neck: No carotid bruits. No lymphadenopathy.  JVD 8-9 cm. Abdomen: Bowel sounds present, abdomen soft and non-tender without masses or hernias noted. Msk:  No spine or cva tenderness. No weakness, no joint deformities or effusions. Extremities: No clubbing or cyanosis. 1-2+ edema.  Neuro: Alert and oriented X 3. No focal deficits noted. Psych:  Good affect, responds appropriately Skin: No rashes or lesions noted.  Labs:   Lab Results  Component Value Date   WBC 8.4 11/12/2015   HGB 11.4* 11/12/2015   HCT 36.2 11/12/2015   MCV 94.5 11/12/2015   PLT 270 11/12/2015    Recent Labs  11/13/15 0406  INR 2.25*     Recent Labs Lab  11/12/15 0241 11/13/15 0406  NA 136 135  K 4.6 4.7  CL 98* 99*  CO2 29 29  BUN 33* 31*  CREATININE 2.60* 2.09*  CALCIUM 8.5* 8.6*  PROT 4.9*  --   BILITOT 0.8  --   ALKPHOS 43  --   ALT 13*  --   AST 20  --   GLUCOSE 100* 124*  ALBUMIN 2.7*  --     Recent Labs  11/12/15 0247  TROPIPOC 0.03   Echo: 07/30/2015 - Left ventricle: The cavity size was normal. Wall thickness was increased in a pattern of moderate LVH. Hypertrophy was noted. Systolic function was normal. The estimated ejection fraction was in the range of 50% to 55%. Wall motion was normal; there were no regional wall motion abnormalities. Doppler parameters are consistent with a reversible restrictive pattern, indicative of decreased left ventricular diastolic compliance and/or increased left atrial pressure (grade 3 diastolic dysfunction). - Ventricular septum: The contour showed a normal configuration. There was no evidence of a ventricular septal defect. - Aortic valve: Valve mobility was mildly restricted. - Mitral valve: Mildly to moderately calcified annulus. - Left atrium: The atrium was severely dilated. - Right atrium: The atrium was moderately dilated. Impressions: - Moderate LVH. Patient was in atrial fibrillation throughout the study, making LVEF assessment less accurate. Grade 3-4 diastolic dysfunction. Severe LA and moderate RA enlargement LVEF 50-55% Aortic valve calcification with mildly restricted leaflet movement and no evidence of aortic stenosis by doppler. Peak gradient 12 mmHg, mean gradient 6 mmHg. Peak velocity 1.7 m/s Moderate mitral annular calcification  ECG: Atrial fib, RVR Plus frequent PVCs  Cath: 2006 1. The left main coronary artery was free of significant disease. 2. The left anterior descending artery gave rise to a large diagonal branch  with two separate perforators and small diagonal branch. There was 40%  proximal and 30%  mid narrowing in the left anterior descending. There  was 40% narrowing in the large  diagonal branch. There was moderate  calcification. 3. The circumflex artery gave rise to a large marginal branch and a small  AV branch which terminated into a posterior lateral branch. There was  surface narrowing of the proximal circumflex artery before the marginal  branch. 4. The right coronary artery is a moderately large vessel which gave rise  to a right ventricular branch, a second acute marginal branch which  supplied the inferior wall and a large posterior lateral branch. There  was 40% narrowing within the ________________stent that was placed in  1993. There was 70to 80% narrowing in the second acute marginal branch  which supplied the inferior wall. 5. The left ventriculogram performed in the RAO projection showed akinesis  of the mid inferior wall. Rest of wall motion was quite good, and the  estimated ejection fraction was 55%. 6. A distal aortogram was performed which showed 30% narrowing in each  renal artery. There were irregularities in the distal aorta, but no  significant aorta-iliac obstruction. 7. The aortic pressure was 162/72, with a mean of 105, left ventricular  pressure of 162/21. RECOMMENDATIONS: The patient has mostly non-obstructive coronary disease. The lesion in the acute marginal branch of the right coronary artery which does supply the equivalent of the posterior descending artery appears borderline, but this did not show up as ischemic on the Cardiolite scan. She also has had no chest pain. We will plan to continue medical therapy. We will arrange followup with Dr. Antoine PocheHochrein.  Radiology:  [facility] 11/12/2015  CLINICAL DATA:  Acute kidney injury. EXAM: RENAL / URINARY TRACT ULTRASOUND COMPLETE COMPARISON:  Renal ultrasound 04/23/2010 FINDINGS: Right Kidney: Length: 9.5 cm. There is  thinning of the renal parenchyma and increased renal echogenicity. An echogenic structure in the interpolar region measuring 1.0 x 0.6 x 0.8 cm is likely a nonobstructing stone, and appears similar to prior renal ultrasound. No mass or hydronephrosis visualized. Left Kidney: Length: 10.4 cm. There is thinning of the renal parenchyma and increased echogenicity. There is an echogenic 1.2 x 0.9 x 1.4 cm in shadowing structure in the interpolar region. No mass or hydronephrosis visualized. Bladder: Appears normal for degree of bladder distention. IMPRESSION: 1. Bilateral nonobstructing renal calculi. 2. Thinning of the renal parenchyma and increased renal echogenicity consistent with chronic medical renal disease. 3. No hydronephrosis. Electronically Signed   By: Rubye OaksMelanie  Ehinger M.D.   On: 11/12/2015 05:41   Dg Chest Port 1 View 11/13/2015  CLINICAL DATA:  Hypoxia and shortness of breath. EXAM: PORTABLE CHEST 1 VIEW COMPARISON:  Yesterday at 0215 hour FINDINGS: Cardiomegaly is unchanged. Progressive vascular congestion, mild pulmonary edema is unchanged. Suspect developing right pleural effusion. Bilateral lower lobe and right suprahilar linear atelectasis. No pneumothorax. IMPRESSION: Progressive vascular congestion and probable developing right pleural effusion. Mild pulmonary edema unchanged. Stable cardiomegaly. Electronically Signed   By: Rubye OaksMelanie  Ehinger M.D.   On: 11/13/2015 03:42   Dg Chest Port 1 View 11/12/2015  CLINICAL DATA:  Acute onset of cough and shortness of breath. Initial encounter. EXAM: PORTABLE CHEST 1 VIEW COMPARISON:  Chest radiograph performed 10/26/2015 FINDINGS: The lungs are well-aerated. Vascular congestion is noted, with increased interstitial markings, concerning for mild interstitial edema. Underlying atelectasis is noted. There is no evidence of pleural effusion or pneumothorax. The cardiomediastinal silhouette is mildly enlarged. No acute osseous abnormalities are seen.  IMPRESSION: Vascular congestion and mild cardiomegaly, with increased interstitial markings, concerning for mild interstitial edema. Underlying atelectasis noted. Electronically Signed   By: Leotis ShamesJeffery  Chang M.D.   On: 11/12/2015 02:44    ASSESSMENT AND PLAN:   The patient was seen today by Dr Anne Fu, the patient evaluated and the data reviewed.  Principal Problem:   Generalized weakness - per IM - pt to go to [facility] after d/c     Acute on Chronic diastolic CHF (congestive heart failure) (HCC) - weight is up and CXR with progressing vascular congestion - However, BUN/Cr are higher than normal 33/2.60 on admission, 16/1.66 2 weeks ago - pt received IV Lasix 40 mg this am, scheduled BID - PTA on torsemide 20 mg daily - if this dose is effective, OK. She has had 900 cc out so far today - agree with following I/O, weights, electrolytes and renal function while diuresing      Atrial fibrillation, RVR (HCC) - believe increased HR 2nd acute illness - on home doses of Cardizem 360 mg and metoprolol 100 mg bid - also w/ frequent PVCs - K+ OK but will check Mg in am.  - MD advise on increasing rate-control rx, SBP 90s once but generally 120s-130s    CAD (coronary artery disease), native coronary artery - last cath report above - seen 07/2015 for chest pain during hospitalization but refused cath at that time - continue statin and BB, no ASA since on coumadin, EF normal  Otherwise, per IM Active Problems:   Essential hypertension, benign   Hyperlipidemia with target LDL less than 100   Gastroesophageal reflux disease without esophagitis   COPD (chronic obstructive pulmonary disease) (HCC)   Acute renal failure superimposed on stage 4 chronic kidney disease (HCC)   Diabetes mellitus without complication (HCC)   Cellulitis of right leg   Dehydration   Depression with anxiety   Chronic anticoagulation - Coumadin, CHADS2VASC=6  SignedLeanna Battles 11/13/2015 12:56 PM Beeper [phone]  Co-Sign MD Personally seen and examined. Agree with above. Feeling better now after further diuresis Watch creat.   Donato Schultz, MD

## 2015-11-13 NOTE — Consult Note (Signed)
   [facility] CM Inpatient Consult   11/13/2015  Alfredia FergusonDessie A Jacques February 06, 1933 [id number]   [facility] RN[facility] who expresses concerns about patient's living conditions and roof leaking. Expresses patient really needs placement. Patient's family has expressed concerns about patient being at home and being unsafe. Question whether patient has capacity. Spoke with Dr. Thedore MinsSingh to make aware of family's concerns and questionable capacity. Dr. Thedore MinsSingh states he will assess. Will continue to follow. Discussed all of the above with inpatient RNCM and [facility] RNCM.  Raiford NobleAtika Zhoey Blackstock, MSN-Ed, RN,BSN [facility] Care Management Hospital Liaison [phone]

## 2015-11-13 NOTE — Consult Note (Signed)
   [facility]   11/13/2015  Katie FergusonDessie A Melendez 04-Nov-1933 [id number]   [facility] that patient is admitted. Please see chart review tab and notes in EPIC for further [facility] correspondence. Went to speak with patient at bedside to make her aware that [facility] to follow. Noted discharge plan is for SNF. However, patient reports she is going home. Telephone call made to nephew, [name] at [phone] per patient's request. Spoke with [name] to confirm patient's discharge plan. [name] states "she does not want to go to SNF." Will make [facility] team aware. Made inpatient RNCM aware of conversation with patient and nephew. Will continue to follow.  Katie NobleAtika Cael Worth, MSN-Ed, RN,BSN [facility] Care Management Hospital Liaison [phone]

## 2015-11-13 NOTE — Clinical Social Work Note (Signed)
[facility] [facility]  CSW has sent referral and followed up with phone call.  CSW awaiting a return call back to determine probability of admission to SNF.  Vickii PennaGina Treyana Sturgell, LCSW [phone]  [facility] Licensed Clinical Social Worker

## 2015-11-13 NOTE — Progress Notes (Signed)
Pt was found yelling out for help and appeared very restless and confused. O2 had been removed by the patient. RN reapplied O2 and patient began to realize where she was and requested to go to the bathroom. Pt unable to void and RN notified Craige CottaKirby PA concerning lack of urine output for the previous shift and increased creatinine.   Orders received and pt is resting comfortably.   Edgardo RoysMcGrath, Tianne Plott R

## 2015-11-13 NOTE — Clinical Social Work Placement (Signed)
   CLINICAL SOCIAL WORK PLACEMENT  NOTE  Date:  11/13/2015  Patient Details  Name: Katie FergusonDessie A Foglesong MRN: [id number] Date of Birth: 1933-06-23  Clinical Social Work is seeking post-discharge placement for this patient at the Skilled  Nursing Facility level of care (*CSW will initial, date and re-position this form in  chart as items are completed):  Yes   Patient/family provided with [place] Clinical Social Work Department's list of facilities offering this level of care within the geographic area requested by the patient (or if unable, by the patient's family).  Yes   Patient/family informed of their freedom to choose among providers that offer the needed level of care, that participate in Medicare, Medicaid or managed care program needed by the patient, have an available bed and are willing to accept the patient.  Yes   Patient/family informed of [facility]'s ownership interest in [facility] and [facility], as well as of the fact that they are under no obligation to receive care at these facilities.  PASRR submitted to EDS on 11/13/15     PASRR number received on 11/13/15     Existing PASRR number confirmed on       FL2 transmitted to all facilities in geographic area requested by pt/family on 11/13/15     FL2 transmitted to all facilities within larger geographic area on       Patient informed that his/her managed care company has contracts with or will negotiate with certain facilities, including the following:            Patient/family informed of bed offers received.  Patient chooses bed at       Physician recommends and patient chooses bed at      Patient to be transferred to   on  .  Patient to be transferred to facility by       Patient family notified on   of transfer.  Name of family member notified:        PHYSICIAN       Additional Comment:    _______________________________________________ Rondel BatonIngle, Hughey Rittenberry C, LCSW 11/13/2015, 9:01 AM

## 2015-11-13 NOTE — Clinical Social Work Note (Addendum)
Clinical Social Work Assessment  Patient Details  Name: [name] MRN: [id number] Date of Birth: 1933-02-01  Date of referral:  11/13/15               Reason for consult:  Facility Placement                Permission sought to share information with:  Family Supports Permission granted to share information::  Yes, Verbal Permission Granted (patient is oriented x 2; [name]- nephew will be Management consultantdecision maker)  Name::        Agency::   ([facility]- [facility])  Relationship::     Contact Information:     Housing/Transportation Living arrangements for the past 2 months:  Single Family Home Source of Information:  Patient, Other (Comment Required) Dawna Part(Nephew Marty) Patient Interpreter Needed:  None Criminal Activity/Legal Involvement Pertinent to Current Situation/Hospitalization:  No - Comment as needed Significant Relationships:    Lives with:  Relatives (Neice and Nephew) Do you feel safe going back to the place where you live?  No (fall risk- PT recommending SNF at time of discharge) Need for family participation in patient care:     Care giving concerns:  Nephew feels that patient is unsafe to return home without supervision   Social Worker assessment / plan:  CSW received consult for possible SNF placement.  [facility].  Patient's main support is nephew, [name] and niece.  PT is recommending SNF at time of discharge.  Patient is alert though only oriented x2.  CSW spoke with patient's nephew, [name] who is agreeable to SNF and [facility] which is in close proximity to patient's/family home.    Employment status:  Retired Health and safety inspectornsurance information:  Teacher, English as a foreign languageManaged Medicare (Multimedia programmer[employer] Healthcare Medicare) PT Recommendations:  Skilled Nursing Facility Information / Referral to community resources:  Skilled Nursing Facility  Patient/Family's Response to care:  Family is agreeable to SNF at  time of discharge.  Patient/Family's Understanding of and Emotional Response to Diagnosis, Current Treatment, and Prognosis:  Patient feels she is stronger than she actually is.  Patient family is realistic regarding needed therapies at time of discharge.    Emotional Assessment Appearance:  Appears stated age Attitude/Demeanor/Rapport:   (appropriate) Affect (typically observed):  Accepting, Adaptable Orientation:  Oriented to Self, Oriented to Place Alcohol / Substance use:  Not Applicable Psych involvement (Current and /or in the community):  No (Comment)  Discharge Needs  Concerns to be addressed:  No discharge needs identified Readmission within the last 30 days:  No Current discharge risk:  None Barriers to Discharge:  Continued Medical Work up, Other (Observation status)   Rondel Batonngle, Kellar Westberg C, LCSW 11/13/2015, 8:41 AM

## 2015-11-13 NOTE — Progress Notes (Signed)
PT eval addendum- added G-codes      11/12/15 1608  PT G-Codes **NOT FOR INPATIENT CLASS**  Functional Assessment Tool Used clinical judgmentq  Functional Limitation Mobility: Walking and moving around  Mobility: Walking and Moving Around Current Status (Z6109(G8978) CI  Mobility: Walking and Moving Around Goal Status (U0454(G8979) CI    Mylo RedShauna Miria Cappelli, PT, DPT [phone]

## 2015-11-13 NOTE — Progress Notes (Signed)
Patient Demographics:    [name], is a 81 y.o. female, DOB - Jan 23, 1933, ZOX:[id number]  Admit date - 11/12/2015   Admitting Physician Lorretta Harp, MD  Outpatient Primary MD for the patient is Bennie Pierini, [name]  LOS - 1   No chief complaint on file.       Subjective:    [employer] today has, No headache, No chest pain, No abdominal pain - No Nausea, No new weakness tingling or numbness, +ve Cough - SOB.     Assessment  & Plan :    1. Generalized weakness with dehydration and acute renal failure. Resolved, was hydrated initially, however this time she appears to have gone into acute on chronic diastolic dysfunction, she has severe grade 3 underlying diastolic dysfunction. Renal function has improved. We'll continue to monitor.  2. Acute hypoxic respiratory failure due to Acute on chronic grade 3 diastolic dysfunction with EF around 55%. She is currently on combination of beta blocker and Cardizem for rate control as per her primary cardiologist which will be continued, have added Nitropaste and Lasix. Cardiology consulted. We'll continue to monitor. Continue oxygen supplementation and applies a treatments.   3. Essential hypertension. Continue Cardizem and metoprolol.   4. Underlying CAD. No acute issues. Continue statin, Coumadin, beta blocker for secondary prevention.   5. Chronic atrial fibrillation CHA2DS2-VASc Score is 6, - beta blocker and Cardizem for rate control, continue Coumadin. Pharmacy monitoring.   6. GERD. On PPI continue.   7. Dyslipidemia. Continued on home dose statin.   8. Mild productive cough. Likely due to underlying URI, added Levaquin and monitor. Chest x-ray shows no infiltrate.   9. History of COPD. Minimal wheezing likely due to pulmonary congestion  is moving good amounts of air bilaterally. We will monitor with supportive care.   10. DM type II. On sliding scale monitor.    Code Status : DNR  Family Communication  : None present  Disposition Plan  : SNF  Consults  :  Cards  Procedures  :    DVT Prophylaxis  :  [place]  Lab Results  Component Value Date   INR 2.25* 11/13/2015   INR 2.60* 11/12/2015   INR 2.81* 11/12/2015     Lab Results  Component Value Date   PLT 270 11/12/2015    Inpatient Medications  Scheduled Meds: . atorvastatin  40 mg Oral q1800  . busPIRone  7.5 mg Oral BID  . diltiazem  360 mg Oral Daily  . fluticasone  2 spray Each Nare Daily  . furosemide      . furosemide  40 mg Intravenous BID  . insulin aspart  0-9 Units Subcutaneous TID WC  . loratadine  10 mg Oral Daily  . metoprolol  100 mg Oral BID  . mometasone-formoterol  2 puff Inhalation BID  . nitroGLYCERIN  0.5 inch Topical 4 times per day  . pantoprazole  40 mg Oral Daily  . sodium chloride  3 mL Intravenous Q12H  . tiotropium  18 mcg Inhalation Daily  . warfarin  5 mg Oral Q T,Th,Sat-1800   And  . warfarin  2.5 mg Oral Q M,W,F,Su-1800  . Warfarin - Pharmacist Dosing Inpatient   Does not apply q1800   Continuous  Infusions:  PRN Meds:.acetaminophen **OR** [DISCONTINUED] acetaminophen, HYDROcodone-acetaminophen, metoprolol, nitroGLYCERIN  Antibiotics  :    Anti-infectives    None        Objective:   Filed Vitals:   11/13/15 0552 11/13/15 0626 11/13/15 0808 11/13/15 0947  BP:    128/77  Pulse:    101  Temp:      TempSrc:      Resp: 18 18    Height:      Weight:      SpO2: 90% 91% 93%     Wt Readings from Last 3 Encounters:  11/13/15 73.982 kg (163 lb 1.6 oz)  11/10/15 70.761 kg (156 lb)  11/03/15 70.943 kg (156 lb 6.4 oz)     Intake/Output Summary (Last 24 hours) at 11/13/15 1008 Last data filed at 11/13/15 0948  Gross per 24 hour  Intake    603 ml  Output   1650 ml  Net  -1047 ml     Physical  Exam  Awake Alert, Oriented X 3, No new F.N deficits, Normal affect Noble.AT,PERRAL Supple Neck,No JVD, No cervical lymphadenopathy appriciated.  Symmetrical Chest wall movement, Good air movement bilaterally, +ve rales iRRR,No Gallops,Rubs or new Murmurs, No Parasternal Heave +ve B.Sounds, Abd Soft, No tenderness, No organomegaly appriciated, No rebound - guarding or rigidity. No Cyanosis, Clubbing or edema, No new Rash or bruise       Data Review:   Micro Results No results found for this or any previous visit (from the past 240 hour(s)).  Radiology Reports Dg Chest 2 View  10/26/2015  CLINICAL DATA:  right leg edema.  Fluid leaking from foot for 1 day EXAM: CHEST  2 VIEW COMPARISON:  10/09/2015 FINDINGS: There is moderate cardiac enlargement. Aortic atherosclerosis noted. Atelectasis is noted in the right base. There is a small right pleural effusion identified. No interstitial edema. Multi level degenerative disc disease identified within the thoracic spine. IMPRESSION: 1. Cardiac enlargement, aortic atherosclerosis and small right pleural effusion. 2. Right upper lobe atelectasis. Electronically Signed   By: Signa Kellaylor  Stroud M.D.   On: 10/26/2015 14:49   [place] Renal  11/12/2015  CLINICAL DATA:  Acute kidney injury. EXAM: RENAL / URINARY TRACT ULTRASOUND COMPLETE COMPARISON:  Renal ultrasound 04/23/2010 FINDINGS: Right Kidney: Length: 9.5 cm. There is thinning of the renal parenchyma and increased renal echogenicity. An echogenic structure in the interpolar region measuring 1.0 x 0.6 x 0.8 cm is likely a nonobstructing stone, and appears similar to prior renal ultrasound. No mass or hydronephrosis visualized. Left Kidney: Length: 10.4 cm. There is thinning of the renal parenchyma and increased echogenicity. There is an echogenic 1.2 x 0.9 x 1.4 cm in shadowing structure in the interpolar region. No mass or hydronephrosis visualized. Bladder: Appears normal for degree of bladder distention.  IMPRESSION: 1. Bilateral nonobstructing renal calculi. 2. Thinning of the renal parenchyma and increased renal echogenicity consistent with chronic medical renal disease. 3. No hydronephrosis. Electronically Signed   By: Rubye OaksMelanie  Ehinger M.D.   On: 11/12/2015 05:41   Dg Chest Port 1 View  11/13/2015  CLINICAL DATA:  Hypoxia and shortness of breath. EXAM: PORTABLE CHEST 1 VIEW COMPARISON:  Yesterday at 0215 hour FINDINGS: Cardiomegaly is unchanged. Progressive vascular congestion, mild pulmonary edema is unchanged. Suspect developing right pleural effusion. Bilateral lower lobe and right suprahilar linear atelectasis. No pneumothorax. IMPRESSION: Progressive vascular congestion and probable developing right pleural effusion. Mild pulmonary edema unchanged. Stable cardiomegaly. Electronically Signed   By: Lujean RaveMelanie  Ehinger M.D.  On: 11/13/2015 03:42   Dg Chest Port 1 View  11/12/2015  CLINICAL DATA:  Acute onset of cough and shortness of breath. Initial encounter. EXAM: PORTABLE CHEST 1 VIEW COMPARISON:  Chest radiograph performed 10/26/2015 FINDINGS: The lungs are well-aerated. Vascular congestion is noted, with increased interstitial markings, concerning for mild interstitial edema. Underlying atelectasis is noted. There is no evidence of pleural effusion or pneumothorax. The cardiomediastinal silhouette is mildly enlarged. No acute osseous abnormalities are seen. IMPRESSION: Vascular congestion and mild cardiomegaly, with increased interstitial markings, concerning for mild interstitial edema. Underlying atelectasis noted. Electronically Signed   By: Roanna Raider M.D.   On: 11/12/2015 02:44     CBC  Recent Labs Lab 11/12/15 0241  WBC 8.4  HGB 11.4*  HCT 36.2  PLT 270  MCV 94.5  MCH 29.8  MCHC 31.5  RDW 15.0  LYMPHSABS 1.1  MONOABS 1.1*  EOSABS 0.1  BASOSABS 0.0    Chemistries   Recent Labs Lab 11/12/15 0241 11/13/15 0406  NA 136 135  K 4.6 4.7  CL 98* 99*  CO2 29 29    GLUCOSE 100* 124*  BUN 33* 31*  CREATININE 2.60* 2.09*  CALCIUM 8.5* 8.6*  AST 20  --   ALT 13*  --   ALKPHOS 43  --   BILITOT 0.8  --    ------------------------------------------------------------------------------------------------------------------ estimated creatinine clearance is 19.3 mL/min (by C-G formula based on Cr of 2.09). ------------------------------------------------------------------------------------------------------------------ No results for input(s): HGBA1C in the last 72 hours. ------------------------------------------------------------------------------------------------------------------ No results for input(s): CHOL, HDL, LDLCALC, TRIG, CHOLHDL, LDLDIRECT in the last 72 hours. ------------------------------------------------------------------------------------------------------------------ No results for input(s): TSH, T4TOTAL, T3FREE, THYROIDAB in the last 72 hours.  Invalid input(s): FREET3 ------------------------------------------------------------------------------------------------------------------ No results for input(s): VITAMINB12, FOLATE, FERRITIN, TIBC, IRON, RETICCTPCT in the last 72 hours.  Coagulation profile  Recent Labs Lab 11/10/15 1241 11/12/15 0241 11/12/15 0719 11/13/15 0406  INR 3.6 2.81* 2.60* 2.25*    No results for input(s): DDIMER in the last 72 hours.  Cardiac Enzymes No results for input(s): CKMB, TROPONINI, MYOGLOBIN in the last 168 hours.  Invalid input(s): CK ------------------------------------------------------------------------------------------------------------------ Invalid input(s): POCBNP   Time Spent in minutes  35   Adilen Pavelko K M.D on 11/13/2015 at 10:08 AM  Between 7am to 7pm - Pager - [phone]  After 7pm go to [email or web address] - password [facility]  [facility] -  Office  [phone]

## 2015-11-13 NOTE — Progress Notes (Signed)
Pt got up to bsc and RN noted pt to be wheezing, appear gray and using accessory muscles. RN notified Craige CottaKirby, [place] and orders received. Pt O2 sat was 63 on [place] 2L and RN applied non-rebreather because pt was not breathing through her nose.    Will continue to monitor.   Edgardo RoysMcGrath, Wilfrido Luedke R

## 2015-11-13 NOTE — Progress Notes (Signed)
Pharmacy CONSULT NOTE  Pharmacy Consult for levofloxacin, warfarin Indication: rule out pneumonia, atrial fibrillation  No Known Allergies  Patient Measurements: Height: 5' (152.4 cm) Weight: 163 lb 1.6 oz (73.982 kg) IBW/kg (Calculated) : 45.5  Vital Signs: Temp: 97.5 F (36.4 C) (11/17 0449) Temp Source: Axillary (11/17 0449) BP: 128/77 mmHg (11/17 0947) Pulse Rate: 101 (11/17 0947) Intake/Output from previous day: 11/16 0701 - 11/17 0700 In: 920 [P.O.:920] Out: 700 [Urine:700] Intake/Output from this shift: Total I/O In: 3 [I.V.:3] Out: 950 [Urine:950]  Labs:  Recent Labs  11/12/15 0123 11/12/15 0241 11/13/15 0406  WBC  --  8.4  --   HGB  --  11.4*  --   PLT  --  270  --   LABCREA 91.51  --   --   CREATININE  --  2.60* 2.09*   Estimated Creatinine Clearance: 19.3 mL/min (by C-G formula based on Cr of 2.09). No results for input(s): VANCOTROUGH, VANCOPEAK, VANCORANDOM, GENTTROUGH, GENTPEAK, GENTRANDOM, TOBRATROUGH, TOBRAPEAK, TOBRARND, AMIKACINPEAK, AMIKACINTROU, AMIKACIN in the last 72 hours.   Assessment: 81 yo F admitted for generalized weakness  PMH: HTN, HLD, DM, COPD, GERD, Depression, CAD, A fib on warfarin  Anticoagulation:  warfarin PTA for Afib, INR 2.25  PTA dose: 5 mg Tu,Th,Sat and 2.5 mg AOD  Infectious Disease: recent cellulitis RLE, now URI (cough only) - WBC 8.4, AF, CXR shows right pleural effusion, mild pulm edema  Levofloxacin 11/17>>  11/16 Blood x 2  11/16 Urine  Cardiovascular: CAD s/p PCI, Afib, HTN, HLD, CHF. Possible CHF exacerbation on iv furosemide  Nephrology: CKD - SCr 2.09 (baseline 1.3-1.8)  Pulmonary: COPD, on 6L [place]  Hematology / Oncology: H&H 11.4*36.2, Plt 270  Goal of Therapy:  Resolution of Infection INR 2 - 3  Plan:  Initiate levofloxacin 750 mg PO q48h Continue home warfarin dose Daily INR, CBC q72h (watch INR increase on levoflox) Monitor for s/sx of bleeding, clinical status for infection and  LOT  Isaac BlissMichael Ethon Wymer, PharmD, BCPS Clinical Pharmacist Pager [phone] 11/13/2015 10:20 AM

## 2015-11-13 NOTE — Progress Notes (Signed)
Per respiratory RN applied venturi mask and has now increased it up to 12L 50%. Pt is O2 sat is only 89%. RN made [place]ir[place], [place] aware. Pt is using less accessory muscles, but has developed tremors in arms.    Will continue to monitor.   Edgardo RoysMcGrath, Nyzir Dubois R

## 2015-11-14 ENCOUNTER — Inpatient Hospital Stay (HOSPITAL_COMMUNITY): Payer: Medicare Other

## 2015-11-14 ENCOUNTER — Ambulatory Visit: Payer: Medicare Other | Admitting: *Deleted

## 2015-11-14 ENCOUNTER — Other Ambulatory Visit: Payer: Self-pay | Admitting: *Deleted

## 2015-11-14 DIAGNOSIS — I5033 Acute on chronic diastolic (congestive) heart failure: Secondary | ICD-10-CM

## 2015-11-14 LAB — BASIC METABOLIC PANEL
Anion gap: 9 (ref 5–15)
BUN: 28 mg/dL — ABNORMAL HIGH (ref 6–20)
CO2: 30 mmol/L (ref 22–32)
Calcium: 8.3 mg/dL — ABNORMAL LOW (ref 8.9–10.3)
Chloride: 97 mmol/L — ABNORMAL LOW (ref 101–111)
Creatinine, Ser: 1.89 mg/dL — ABNORMAL HIGH (ref 0.44–1.00)
GFR calc Af Amer: 28 mL/min — ABNORMAL LOW (ref >60)
GFR calc non Af Amer: 24 mL/min — ABNORMAL LOW (ref >60)
Glucose, Bld: 137 mg/dL — ABNORMAL HIGH (ref 65–99)
Potassium: 4.1 mmol/L (ref 3.5–5.1)
Sodium: 136 mmol/L (ref 135–145)

## 2015-11-14 LAB — UREA NITROGEN, URINE: Urea Nitrogen, Ur: 417 mg/dL

## 2015-11-14 LAB — PROTIME-INR
INR: 2.28 — ABNORMAL HIGH (ref 0.00–1.49)
Prothrombin Time: 24.9 seconds — ABNORMAL HIGH (ref 11.6–15.2)

## 2015-11-14 LAB — CBC
HCT: 35.3 % — ABNORMAL LOW (ref 36.0–46.0)
Hemoglobin: 11.1 g/dL — ABNORMAL LOW (ref 12.0–15.0)
MCH: 29.3 pg (ref 26.0–34.0)
MCHC: 31.4 g/dL (ref 30.0–36.0)
MCV: 93.1 fL (ref 78.0–100.0)
Platelets: 249 10*3/uL (ref 150–400)
RBC: 3.79 MIL/uL — ABNORMAL LOW (ref 3.87–5.11)
RDW: 14.5 % (ref 11.5–15.5)
WBC: 10.9 10*3/uL — ABNORMAL HIGH (ref 4.0–10.5)

## 2015-11-14 LAB — URINE CULTURE: Culture: >=100000

## 2015-11-14 LAB — GLUCOSE, CAPILLARY
Glucose-Capillary: 178 mg/dL — ABNORMAL HIGH (ref 65–99)
Glucose-Capillary: 107 mg/dL — ABNORMAL HIGH (ref 65–99)
Glucose-Capillary: 155 mg/dL — ABNORMAL HIGH (ref 65–99)
Glucose-Capillary: 102 mg/dL — ABNORMAL HIGH (ref 65–99)

## 2015-11-14 LAB — MAGNESIUM: Magnesium: 1.7 mg/dL (ref 1.7–2.4)

## 2015-11-14 MED ORDER — LEVOFLOXACIN 500 MG PO TABS
500.0000 mg | ORAL_TABLET | ORAL | Status: DC
Start: 2015-11-15 — End: 2015-11-17
  Administered 2015-11-15 – 2015-11-17 (×2): 500 mg via ORAL
  Filled 2015-11-14 (×2): qty 1

## 2015-11-14 MED ORDER — METOLAZONE 2.5 MG PO TABS
2.5000 mg | ORAL_TABLET | Freq: Once | ORAL | Status: AC
  Administered 2015-11-14: 2.5 mg via ORAL
  Filled 2015-11-14: qty 1

## 2015-11-14 MED ORDER — MAGNESIUM SULFATE 2 GM/50ML IV SOLN
2.0000 g | Freq: Once | INTRAVENOUS | Status: AC
  Administered 2015-11-14: 2 g via INTRAVENOUS
  Filled 2015-11-14: qty 50

## 2015-11-14 MED ORDER — LORAZEPAM 0.5 MG PO TABS
0.5000 mg | ORAL_TABLET | Freq: Once | ORAL | Status: AC
  Administered 2015-11-15: 0.5 mg via ORAL
  Filled 2015-11-14: qty 1

## 2015-11-14 NOTE — Progress Notes (Signed)
ANTICOAGULATION CONSULT NOTE - Follow Up Consult ANTIBIOTIC CONSULT NOTE - Follow Up Consult  Pharmacy Consult for Coumadin and Levaquin Indication: atrial fibrillation and rule out pneumonia  No Known Allergies  Patient Measurements: Height: 5' (152.4 cm) Weight: 166 lb 12.8 oz (75.66 kg) IBW/kg (Calculated) : 45.5  Vital Signs: Temp: 97.4 F (36.3 C) (11/18 0419) Temp Source: Oral (11/18 0419) BP: 130/52 mmHg (11/18 0419) Pulse Rate: 103 (11/18 0419)  Labs:  Recent Labs  11/12/15 0241 11/12/15 0719 11/13/15 0406 11/14/15 0412  HGB 11.4*  --   --  11.1*  HCT 36.2  --   --  35.3*  PLT 270  --   --  249  LABPROT 29.1* 27.5* 24.6* 24.9*  INR 2.81* 2.60* 2.25* 2.28*  CREATININE 2.60*  --  2.09* 1.89*    Estimated Creatinine Clearance: 21.6 mL/min (by C-G formula based on Cr of 1.89).  Assessment: 82yof continues on coumadin as pta for afib. INR remains therapeutic. CBC is stable. No bleeding reported.  She was also started on levaquin yesterday for possible pneumonia. Levaquin can potentiate the INR, however, no drug interaction seen yet. Renal function is improving, but CrCl still < 3230ml/min so will adjust dose. Received 750mg  x 1 yesterday.  Levaquin 11/17>>  11/16 Blood x 2>>ngtd 11/16 Urine>>pending  Goal of Therapy:  INR 2-3 Monitor platelets by anticoagulation protocol: Yes   Plan:  1) Continue coumadin 2.5mg  daily except 5mg  T/Th/Sat 2) Daily INR 3) Change levaquin to 500mg  PO q48 - next dose due tomorrow  Fredrik RiggerMarkle, Buffey Zabinski Sue 11/14/2015,9:28 AM

## 2015-11-14 NOTE — Care Management Note (Addendum)
Case Management Note  Patient Details  Name: Katie FergusonDessie A Melendez MRN: [id number] Date of Birth: 1933-12-25  Subjective/Objective:    Pt admitted with generalized weakness and dehydration; pt in A Fib with RVR                Action/Plan:  Pt is alone from home with home health RN, PT , Aide, pt stated she believed she is [facility] with [facility], CM contacted agency, agency to confirm, agency confirmed pt is active.  [facility]     Expected Discharge Date:                  Expected Discharge Plan:  Home w Home Health Services (Pt stays alone with close family (nephew and neices provide constant support and assistance when needed)  In-House Referral:     Discharge planning Services  CM Consult  Post Acute Care Choice:    Choice offered to:     DME Arranged:   3:1 [facility]  HH Arranged:   PT, RN, Aide, SW HH Agency:   [facility]  Status of Service:  In process, will continue to follow  Medicare Important Message Given:    Date Medicare IM Given:    Medicare IM give by:    Date Additional Medicare IM Given:    Additional Medicare Important Message give by:     If discussed at Long Length of Stay Meetings, dates discussed:    Additional Comments: Pt discharge location (nephews address) [address], [place] [address]  Pt is currently refusing SNF placement as recommended.  SW, CM, PT, OT, nephew and [facility] have all attempted to discuss the safety concern and rationale for the SNF recommendation, however pt continues to refuse.  Pt will discharge home with her nephew [name] following discharge and will have 24 hour supervision, this discharge plan was verified with [name].  CM contacted [facility] to inform of pending discharge 11/15/15 and discharge address.   Cherylann ParrClaxton, Loistine Eberlin S, RN 11/14/2015, 2:40 PM

## 2015-11-14 NOTE — Patient Outreach (Signed)
11/13/15- RN CM received call from Katie RodriguezAmanda Melendez, (she helps care for pt and lives behind the pt), Katie Folksmanda expresses concern that it has been recommended pt go to skilled nursing facility short term and pt is refusing, and pt will require more care than the family can provide. RN CM talked with Raiford NobleAtika Melendez, [facility] hospital liason and informed her family concerned that pt is refusing short term skilled care as recommended by medical staff at hospital.  Katie ScullAtika spoke with [name] and MD will assess further on 11/14/15- patient's mental capacity. 11/14/15- note [facility] from [facility] RN stating pt is coherent and is still refusing skilled nursing facility and has been noted tentative plan is to discharge home and live with [name] and Katie Folksmanda and will have home health. Pt still admitted 11/14/15.  [name] [facility], BSN [name] [phone]

## 2015-11-14 NOTE — Progress Notes (Addendum)
NP Lynch paged about Pt being  unable to sleep for past 2 nights. New order received for Ativan 0.5mg  PO. Will continue to monitor. Laurann MontanaFolashade Alabi,RN

## 2015-11-14 NOTE — Consult Note (Signed)
   [facility]   11/14/2015  Alfredia FergusonDessie A Gulick Dec 26, 1933 [id number]   Came back by to visit with Ms. Ellis SavageKiser. Spoke with inpatient RNCM prior to returning to room. Made aware that patient is still refusing SNF. Spoke with Ms. Ellis SavageKiser at bedside and she continues to be adamant about going to SNF. States " I would be grieved to death if I went to one of those places". She did not want to discuss SNF options any further. However, she was pleasant. Appears discharge plan will be to stay with her nephew per inpatient RNCM and will have home health services. Will send notification to [facility] RNCM.   Raiford NobleAtika Trenika Hudson, MSN-Ed, RN,BSN [facility] Hospital Liaison [phone]

## 2015-11-14 NOTE — Progress Notes (Signed)
Patient Name: [name] Date of Encounter: 11/14/2015   SUBJECTIVE  Had worsen sob early this morning, now improved seems. Still have cough. Denies CP or palpitations.   CURRENT MEDS . atorvastatin  40 mg Oral q1800  . busPIRone  7.5 mg Oral BID  . diltiazem  360 mg Oral Daily  . fluticasone  2 spray Each Nare Daily  . furosemide  40 mg Intravenous BID  . insulin aspart  0-9 Units Subcutaneous TID WC  . [START ON 11/15/2015] levofloxacin  500 mg Oral Q48H  . loratadine  10 mg Oral Daily  . metoprolol  100 mg Oral BID  . mometasone-formoterol  2 puff Inhalation BID  . nitroGLYCERIN  0.5 inch Topical 4 times per day  . pantoprazole  40 mg Oral Daily  . sodium chloride  3 mL Intravenous Q12H  . tiotropium  18 mcg Inhalation Daily  . warfarin  5 mg Oral Q T,Th,Sat-1800   And  . warfarin  2.5 mg Oral Q M,W,F,Su-1800  . Warfarin - Pharmacist Dosing Inpatient   Does not apply q1800    OBJECTIVE  Filed Vitals:   11/14/15 0608 11/14/15 0740 11/14/15 1109 11/14/15 1110  BP:   119/66   Pulse:    107  Temp:      TempSrc:      Resp:      Height:      Weight:      SpO2: 94% 94%      Intake/Output Summary (Last 24 hours) at 11/14/15 1219 Last data filed at 11/14/15 1107  Gross per 24 hour  Intake    243 ml  Output   1525 ml  Net  -1282 ml   Filed Weights   11/12/15 0602 11/13/15 0311 11/14/15 0419  Weight: 161 lb (73.029 kg) 163 lb 1.6 oz (73.982 kg) 166 lb 12.8 oz (75.66 kg)    PHYSICAL EXAM  General: Pleasant, ill appering female in NAD. Sitting in chair. Nasal cannula in place.  Neuro: Alert and oriented X 3. Moves all extremities spontaneously. Psych: Normal affect. HEENT:  Normal  Neck: Supple without bruits or JVD.  Lungs:  Resp regular and unlabored. Diminished breath sound with faint rales.  Heart: Ir Ir with tachycardia no s3, s4, or murmurs. Abdomen: Soft, non-tender, non-distended, BS + x 4.  Extremities: No clubbing, cyanosis. 2+ BL LE edema.  Compression stocking in place.  DP/PT/Radials 2+ and equal bilaterally.  Accessory Clinical Findings  CBC  Recent Labs  11/12/15 0241 11/14/15 0412  WBC 8.4 10.9*  NEUTROABS 6.0  --   HGB 11.4* 11.1*  HCT 36.2 35.3*  MCV 94.5 93.1  PLT 270 249   Basic Metabolic Panel  Recent Labs  11/13/15 0406 11/14/15 0412  NA 135 136  K 4.7 4.1  CL 99* 97*  CO2 29 30  GLUCOSE 124* 137*  BUN 31* 28*  CREATININE 2.09* 1.89*  CALCIUM 8.6* 8.3*  MG  --  1.7   Liver Function Tests  Recent Labs  11/12/15 0241  AST 20  ALT 13*  ALKPHOS 43  BILITOT 0.8  PROT 4.9*  ALBUMIN 2.7*    TELE  afib at rate of 90-110s.   Radiology/Studies  Dg Chest 2 View  10/26/2015  CLINICAL DATA:  right leg edema.  Fluid leaking from foot for 1 day EXAM: CHEST  2 VIEW COMPARISON:  10/09/2015 FINDINGS: There is moderate cardiac enlargement. Aortic atherosclerosis noted. Atelectasis is noted in the right base. There  is a small right pleural effusion identified. No interstitial edema. Multi level degenerative disc disease identified within the thoracic spine. IMPRESSION: 1. Cardiac enlargement, aortic atherosclerosis and small right pleural effusion. 2. Right upper lobe atelectasis. Electronically Signed   By: Signa Kell M.D.   On: 10/26/2015 14:49   US Renal  11/12/2015  CLINICAL DATA:  Acute kidney injury. EXAM: RENAL / URINARY TRACT ULTRASOUND COMPLETE COMPARISON:  Renal ultrasound 04/23/2010 FINDINGS: Right Kidney: Length: 9.5 cm. There is thinning of the renal parenchyma and increased renal echogenicity. An echogenic structure in the interpolar region measuring 1.0 x 0.6 x 0.8 cm is likely a nonobstructing stone, and appears similar to prior renal ultrasound. No mass or hydronephrosis visualized. Left Kidney: Length: 10.4 cm. There is thinning of the renal parenchyma and increased echogenicity. There is an echogenic 1.2 x 0.9 x 1.4 cm in shadowing structure in the interpolar region. No mass or  hydronephrosis visualized. Bladder: Appears normal for degree of bladder distention. IMPRESSION: 1. Bilateral nonobstructing renal calculi. 2. Thinning of the renal parenchyma and increased renal echogenicity consistent with chronic medical renal disease. 3. No hydronephrosis. Electronically Signed   By: Rubye Oaks M.D.   On: 11/12/2015 05:41   Dg Chest Port 1 View  11/14/2015  CLINICAL DATA:  Shortness of breath.  History of COPD. EXAM: PORTABLE CHEST 1 VIEW COMPARISON:  Single view of the chest 11/13/2015 and 11/12/2015. CT chest 09/20/2013. FINDINGS: Cardiomegaly is again seen. Small bilateral pleural effusions are present, greater on the right. There is mild interstitial edema. Right mid lung zone and right basilar atelectasis noted. IMPRESSION: Some increase in a small right pleural effusion with a trace left effusion now seen. Cardiomegaly and mild interstitial edema without marked interval change. Electronically Signed   By: Drusilla Kanner M.D.   On: 11/14/2015 07:16   Dg Chest Port 1 View  11/13/2015  CLINICAL DATA:  Hypoxia and shortness of breath. EXAM: PORTABLE CHEST 1 VIEW COMPARISON:  Yesterday at 0215 hour FINDINGS: Cardiomegaly is unchanged. Progressive vascular congestion, mild pulmonary edema is unchanged. Suspect developing right pleural effusion. Bilateral lower lobe and right suprahilar linear atelectasis. No pneumothorax. IMPRESSION: Progressive vascular congestion and probable developing right pleural effusion. Mild pulmonary edema unchanged. Stable cardiomegaly. Electronically Signed   By: Rubye Oaks M.D.   On: 11/13/2015 03:42   Dg Chest Port 1 View  11/12/2015  CLINICAL DATA:  Acute onset of cough and shortness of breath. Initial encounter. EXAM: PORTABLE CHEST 1 VIEW COMPARISON:  Chest radiograph performed 10/26/2015 FINDINGS: The lungs are well-aerated. Vascular congestion is noted, with increased interstitial markings, concerning for mild interstitial edema.  Underlying atelectasis is noted. There is no evidence of pleural effusion or pneumothorax. The cardiomediastinal silhouette is mildly enlarged. No acute osseous abnormalities are seen. IMPRESSION: Vascular congestion and mild cardiomegaly, with increased interstitial markings, concerning for mild interstitial edema. Underlying atelectasis noted. Electronically Signed   By: Roanna Raider M.D.   On: 11/12/2015 02:44    ASSESSMENT AND PLAN  Generalized weakness - per IM - pt to go to [facility] after d/c   Acute on Chronic diastolic CHF (congestive heart failure) (HCC) - weight is up and CXR with progressing vascular congestion. Now on IV Lasix 40 mg BID. BNP of 882.8 - Diuresed 2.2 L yesterday however weight up 3lb. Doubt accurate.  Continue IV lasix with stocking.  - Creatinine improved to 1.89. Continue to monitor electrolytes and renal function while diuresing.  - strict  I/O and daily weights.  - Echo 07/30/15 showed Grade 3-4 diastolic dysfunction. Severe LA and moderate RA enlargement LVEF 50-55%. Aortic valve calcification with mildly restricted leaflet movement and no evidence of aortic stenosis by doppler. Peak gradient 12 mmHg, mean gradient 6 mmHg. Peak velocity 1.7 m/s Moderate mitral annular calcification  Atrial fibrillation, RVR (HCC) - Likely increased HR 2nd acute illness - on home doses of Cardizem 360 mg and metoprolol 100 mg bid - Tele showed rate of 90-100s w/ frequent PVCs - MD advise on increasing rate-control rx - Coumadin per pharmacy for anticoagulation  CAD (coronary artery disease), native coronary artery - last cath report 2006 showed non obstructive CAD - seen 07/2015 for chest pain during hospitalization but refused cath at that time - continue statin and BB, no ASA since on coumadin, EF normal  Essential hypertension, benign - BP improved. Now stable.   Otherwise, per IM Active Problems:  Hyperlipidemia with target LDL less than 100   Gastroesophageal reflux disease without esophagitis  COPD (chronic obstructive pulmonary disease) (HCC)  Acute renal failure superimposed on stage 4 chronic kidney disease (HCC)  Diabetes mellitus without complication (HCC)  Cellulitis of right leg  Dehydration  Depression with anxiety  Chronic anticoagulation - Coumadin, CHADS2VASC=6  Signed, Bhagat,Bhavinkumar PA-C Pager [phone]  Personally seen and examined. Agree with above. AFIB reasonable control on dilt and metop at current doses. Diastolic HF improved Diuresis going well Possible DC tomorrow  .mwm

## 2015-11-14 NOTE — Progress Notes (Signed)
Occupational Therapy Treatment Patient Details Name: [name] MRN: [id number] DOB: 1933-11-18 Today's Date: 11/14/2015    History of present illness Patient is a 81 y/o female presents with generalized weakness and dehydration, found to be in A Fib with RVR. PMH includes COPD on 2L 02 at home, CAD s/p PCI x2, A-fib on warfarin, CKD stage IV, chronic diastolic CHF, and smoking.   OT comments  Pt demonstrates fall risk and cognitive deficits this session. Pt is a fall risk and declines to ask family to stay with her to (A). No family present to discuss d/c options or recommendations. Pt states "i dont need it" Pt educated on safety tips for home ( ie having phone access at all times in case of a fall) Pt reports having previous fall and staying on the ground until nephew came and another fall but being able to get up off floor independently.   Follow Up Recommendations  SNF;Supervision/Assistance - 24 hour (pt refusing SNF- recommend HHOT/ family 24 A upon d/c)    Equipment Recommendations  3 in 1 bedside comode    Recommendations for Other Services      Precautions / Restrictions Precautions Precautions: Fall Precaution Comments: monitor 02 Restrictions Weight Bearing Restrictions: No       Mobility Bed Mobility               General bed mobility comments: in chair on arrival  Transfers Overall transfer level: Needs assistance Equipment used: Rolling walker (2 wheeled) Transfers: Sit to/from Stand Sit to Stand: Min guard         General transfer comment: requires use of bil Ue to complete task. Pt needs min (A) to manage oxygen tubes    Balance Overall balance assessment: Needs assistance Sitting-balance support: Bilateral upper extremity supported;Feet supported Sitting balance-Leahy Scale: Good     Standing balance support: Bilateral upper extremity supported;During functional activity Standing balance-Leahy Scale: Poor                      ADL Overall ADL's : Needs assistance/impaired     Grooming: Wash/dry hands;Min guard;Standing Grooming Details (indicate cue type and reason): leaning on counter with bil UE and improper positioning of RW                 Toilet Transfer: Min guard;RW;Grab bars Toilet Transfer Details (indicate cue type and reason): pt attempting to exit the room after requesting trip to the bathroom. pt needed cues to locate bathroom Toileting- Clothing Manipulation and Hygiene: Min guard Toileting - Clothing Manipulation Details (indicate cue type and reason): Pt pushing the door open and OT asking if patient was ready to exit bathroom. Pt states "i am looking for the toilet paper" Pt was unable to locate the toilet paper without max cues      Functional mobility during ADLs: Minimal assistance;Rolling walker;Cueing for safety General ADL Comments: Pt reports nephew and niece are self employeed and can stay with patient. Pt then reports she doesnt plan to have them present. Ot asked "why" pt states "because I dont need them" Pt educated on fall prevention, keeping phone with her person at all times in case of a fall since she lives alone, showed patient how to attach grocery bag to walker to hold items and discussed meal planning for safety in kitchen. pt states "they can bring me something to eat if i need it" Pt lacks awareness to deficits. pt with unsafe use of oxygen tubing  during ambulation. pt needed cues for safety with RW       Vision                     Perception     Praxis      Cognition   Behavior During Therapy: [facility] for tasks assessed/performed Overall Cognitive Status: Impaired/Different from baseline Area of Impairment: Awareness;Safety/judgement;Following commands     Memory: Decreased short-term memory  Following Commands: Follows multi-step commands with increased time (poor return demo of 3 step commands) Safety/Judgement: Decreased awareness of safety;Decreased  awareness of deficits Awareness: Emergent   General Comments: Pt with lack of awareness to fall risk and need for (A) for initial d/c home    Extremity/Trunk Assessment               Exercises General Exercises - Lower Extremity Ankle Circles/Pumps: Both;10 reps;Seated Long Arc Quad: Both;20 reps;Seated;Strengthening Hip Flexion/Marching: 20 reps;Both;Seated;Strengthening   Shoulder Instructions       General Comments      Pertinent Vitals/ Pain       Pain Assessment: No/denies pain  Home Living                                          Prior Functioning/Environment              Frequency Min 2X/week     Progress Toward Goals  OT Goals(current goals can now be found in the care plan section)  Progress towards OT goals: Progressing toward goals  Acute Rehab OT Goals Patient Stated Goal: go home, "I don't want anyone staying with me all the time" OT Goal Formulation: With patient Time For Goal Achievement: 11/27/15 Potential to Achieve Goals: Good ADL Goals Pt Will Perform Grooming: with supervision;standing Pt Will Transfer to Toilet: with supervision;bedside commode;ambulating Additional ADL Goal #1: Pt will be supervision using RW/rollator for functional mobility Additional ADL Goal #2: Pt will be educated on energy conservation techniques/strategies and be able to verbalize at least 3 techniques   Plan Discharge plan remains appropriate    Co-evaluation                 End of Session Equipment Utilized During Treatment: Gait belt;Rolling walker;Oxygen   Activity Tolerance Patient tolerated treatment well   Patient Left in chair;with call bell/phone within reach   Nurse Communication Mobility status;Precautions        Time: 1000-1015 OT Time Calculation (min): 15 min  Charges: OT General Charges $OT Visit: 1 Procedure OT Treatments $Self Care/Home Management : 8-22 mins  Boone MasterJones, Zachariah Pavek B 11/14/2015, 11:05  AM  Mateo FlowJones, Brynn   OTR/L Pager: [phone] Office: [phone] .

## 2015-11-14 NOTE — Progress Notes (Signed)
Physical Therapy Treatment Patient Details Name: Katie FergusonDessie A Melendez MRN: [id number] DOB: [birth year] Today's Date: 11/14/2015    History of Present Illness Patient is a 81 y/o female presents with generalized weakness and dehydration, found to be in A Fib with RVR. PMH includes COPD on 2L 02 at home, CAD s/p PCI x2, A-fib on warfarin, CKD stage IV, chronic diastolic CHF, and smoking.    PT Comments    Patient progressing slowly towards PT goals. Tolerated short bouts of activity with longer rest breaks but continues to fatigue quickly and demonstrates DOE. Continues to have decrease in oxygen saturation with mobility to low 80s on 3L/min 02. Pt with almost LOB when going to sit on surface requiring Min A to prevent missing bed. High falls risk! [facility] however if pt continues to refuse, recommend 24/7 assist and HHPT. Will follow acutely.   Follow Up Recommendations  SNF (pt refusing ST SNF)     Equipment Recommendations  3in1 (PT)    Recommendations for Other Services       Precautions / Restrictions Precautions Precautions: Fall Precaution Comments: monitor 02 Restrictions Weight Bearing Restrictions: No    Mobility  Bed Mobility               General bed mobility comments: Sitting in chair upon PT arrival.   Transfers Overall transfer level: Needs assistance Equipment used: Rolling walker (2 wheeled) Transfers: Sit to/from Stand Sit to Stand: Min guard         General transfer comment: Min guard for safety. Stood from chair x1, from EOB x1. ALmost LOB when turning to sit down on surface.  Ambulation/Gait Ambulation/Gait assistance: Min assist Ambulation Distance (Feet): 60 Feet (x2 bouts) Assistive device: Rolling walker (2 wheeled) Gait Pattern/deviations: Step-through pattern;Decreased stride length;Staggering right;Staggering left;Drifts right/left Gait velocity: decreased   General Gait Details: Slow, unsteady gait with min A for  balance/safety. Some assist negotiating RW. Sp02 dropped to low 80s on 3L/min 02. 2-3/4 DOE. 1 longer seated rest break.    Stairs            Wheelchair Mobility    Modified Rankin (Stroke Patients Only)       Balance Overall balance assessment: Needs assistance Sitting-balance support: Feet supported;No upper extremity supported Sitting balance-Leahy Scale: Good     Standing balance support: During functional activity Standing balance-Leahy Scale: Poor                      Cognition Arousal/Alertness: Awake/alert Behavior During Therapy: WFL for tasks assessed/performed Overall Cognitive Status: Within Functional Limits for tasks assessed                      Exercises General Exercises - Lower Extremity Ankle Circles/Pumps: Both;10 reps;Seated Long Arc Quad: Both;20 reps;Seated;Strengthening Hip Flexion/Marching: 20 reps;Both;Seated;Strengthening    General Comments        Pertinent Vitals/Pain Pain Assessment: No/denies pain    Home Living                      Prior Function            PT Goals (current goals can now be found in the care plan section) Progress towards PT goals: Progressing toward goals    Frequency  Min 3X/week    PT Plan Current plan remains appropriate    Co-evaluation  End of Session Equipment Utilized During Treatment: Gait belt;Oxygen Activity Tolerance: Patient limited by fatigue Patient left: in chair;with call bell/phone within reach     Time: 0908-0933 PT Time Calculation (min) (ACUTE ONLY): 25 min  Charges:  $Gait Training: 8-22 mins $Therapeutic Activity: 8-22 mins                    G Codes:      [name] 11/14/2015, 9:43 AM Mylo Red, PT, DPT [phone]

## 2015-11-14 NOTE — Progress Notes (Signed)
Patient Demographics:    [name], is a 81 y.o. female, DOB - 07-Jul-1933, WUJ:[id number]  Admit date - 11/12/2015   Admitting Physician Lorretta HarpXilin Niu, MD  Outpatient Primary MD for the patient is Katie PieriniMARTIN,Katie MARGARET, [name]  LOS - 2   No chief complaint on file.       Subjective:    Big LotsDessie Claudio today has, No headache, No chest pain, No abdominal pain - No Nausea, No new weakness tingling or numbness, improved cough and shortness of breath.   Assessment  & Plan :    1. Generalized weakness with dehydration and acute renal failure. Resolved, was hydrated initially, however this time she appears to have gone into acute on chronic diastolic dysfunction, she has severe grade 3 underlying diastolic dysfunction. Hydration held and in fact she had to be started on diuretics, fortunately renal function continues to improve.    2. Acute hypoxic respiratory failure due to Acute on chronic grade 3 diastolic dysfunction with EF around 55%. She is currently on combination of beta blocker and Cardizem for rate control as per her primary cardiologist which will be continued, have added Nitropaste and Lasix. Cardiology consulted. We'll continue to monitor. Continue oxygen supplementation and applies a treatments. She is on 2 L nasal cannula oxygen at home. Feels much better this morning.   3. Essential hypertension. Continue Cardizem and metoprolol.   4. Underlying CAD. No acute issues. Continue statin, Coumadin, beta blocker for secondary prevention.   5. Chronic atrial fibrillation CHA2DS2-VASc Score is 6, - beta blocker and Cardizem for rate control, continue Coumadin. Pharmacy monitoring. Cardiology on board.   6. GERD. On PPI continue.   7. Dyslipidemia. Continued on home dose statin.   8. Mild  productive cough. Likely due to underlying URI, added Levaquin and monitor. Chest x-ray shows no infiltrate.   9. History of COPD. Minimal wheezing likely due to pulmonary congestion is moving good amounts of air bilaterally. We will monitor with supportive care.   10. DM type II. On sliding scale monitor.  CBG (last 3)   Recent Labs  11/13/15 1616 11/13/15 2118 11/14/15 0605  GLUCAP 194* 131* 178*    Lab Results  Component Value Date   HGBA1C 6.4* 09/17/2015     Code Status : DNR  Family Communication  : None present  Disposition Plan  : HHPT  Consults  :  Cards  Procedures  :      DVT Prophylaxis  :  [place]din  Lab Results  Component Value Date   INR 2.28* 11/14/2015   INR 2.25* 11/13/2015   INR 2.60* 11/12/2015     Lab Results  Component Value Date   PLT 249 11/14/2015    Inpatient Medications  Scheduled Meds: . atorvastatin  40 mg Oral q1800  . busPIRone  7.5 mg Oral BID  . diltiazem  360 mg Oral Daily  . fluticasone  2 spray Each Nare Daily  . furosemide  40 mg Intravenous BID  . insulin aspart  0-9 Units Subcutaneous TID WC  . [START ON 11/15/2015] levofloxacin  500 mg Oral Q48H  . loratadine  10 mg Oral Daily  . magnesium sulfate 1 - 4 g bolus IVPB  2 g Intravenous Once  . metolazone  2.5 mg Oral Once  . metoprolol  100 mg Oral BID  . mometasone-formoterol  2 puff Inhalation BID  . nitroGLYCERIN  0.5 inch Topical 4 times per day  . pantoprazole  40 mg Oral Daily  . sodium chloride  3 mL Intravenous Q12H  . tiotropium  18 mcg Inhalation Daily  . warfarin  5 mg Oral Q T,Th,Sat-1800   And  . warfarin  2.5 mg Oral Q M,W,F,Su-1800  . Warfarin - Pharmacist Dosing Inpatient   Does not apply q1800   Continuous Infusions:  PRN Meds:.acetaminophen **OR** [DISCONTINUED] acetaminophen, albuterol, HYDROcodone-acetaminophen, metoprolol, nitroGLYCERIN  Antibiotics  :    Anti-infectives    Start     Dose/Rate Route Frequency Ordered Stop    11/15/15 1200  levofloxacin (LEVAQUIN) tablet 500 mg     500 mg Oral Every 48 hours 11/14/15 0911     11/13/15 1200  levofloxacin (LEVAQUIN) tablet 750 mg  Status:  Discontinued     750 mg Oral Every 48 hours 11/13/15 1018 11/14/15 0911        Objective:   Filed Vitals:   11/13/15 2121 11/14/15 0419 11/14/15 0608 11/14/15 0740  BP: 121/64 130/52    Pulse: 89 103    Temp: 98.7 F (37.1 C) 97.4 F (36.3 C)    TempSrc: Oral Oral    Resp: 21 21    Height:      Weight:  75.66 kg (166 lb 12.8 oz)    SpO2: 95% 95% 94% 94%    Wt Readings from Last 3 Encounters:  11/14/15 75.66 kg (166 lb 12.8 oz)  11/10/15 70.761 kg (156 lb)  11/03/15 70.943 kg (156 lb 6.4 oz)     Intake/Output Summary (Last 24 hours) at 11/14/15 0953 Last data filed at 11/14/15 0745  Gross per 24 hour  Intake    240 ml  Output   1925 ml  Net  -1685 ml     Physical Exam  Awake Alert, Oriented X 3, No new F.N deficits, Normal affect [place].AT,PERRAL Supple Neck,No JVD, No cervical lymphadenopathy appriciated.  Symmetrical Chest wall movement, Good air movement bilaterally, +ve rales iRRR,No Gallops,Rubs or new Murmurs, No Parasternal Heave +ve B.Sounds, Abd Soft, No tenderness, No organomegaly appriciated, No rebound - guarding or rigidity. No Cyanosis, Clubbing or edema, No new Rash or bruise       Data Review:   Micro Results Recent Results (from the past 240 hour(s))  Culture, blood (routine x 2)     Status: None (Preliminary result)   Collection Time: 11/12/15  2:39 AM  Result Value Ref Range Status   Specimen Description BLOOD RIGHT ANTECUBITAL  Final   Special Requests BOTTLES DRAWN AEROBIC AND ANAEROBIC 5CC   Final   Culture NO GROWTH 1 DAY  Final   Report Status PENDING  Incomplete  Culture, blood (routine x 2)     Status: None (Preliminary result)   Collection Time: 11/12/15  2:41 AM  Result Value Ref Range Status   Specimen Description BLOOD RIGHT WRIST  Final   Special Requests BOTTLES  DRAWN AEROBIC AND ANAEROBIC 10CC   Final   Culture NO GROWTH 1 DAY  Final   Report Status PENDING  Incomplete    Radiology Reports Dg Chest 2 View  10/26/2015  CLINICAL DATA:  right leg edema.  Fluid leaking from foot for 1 day EXAM: CHEST  2 VIEW COMPARISON:  10/09/2015 FINDINGS: There is moderate cardiac enlargement. Aortic atherosclerosis noted. Atelectasis is noted in  the right base. There is a small right pleural effusion identified. No interstitial edema. Multi level degenerative disc disease identified within the thoracic spine. IMPRESSION: 1. Cardiac enlargement, aortic atherosclerosis and small right pleural effusion. 2. Right upper lobe atelectasis. Electronically Signed   By: Signa Kell M.D.   On: 10/26/2015 14:49   US Renal  11/12/2015  CLINICAL DATA:  Acute kidney injury. EXAM: RENAL / URINARY TRACT ULTRASOUND COMPLETE COMPARISON:  Renal ultrasound 04/23/2010 FINDINGS: Right Kidney: Length: 9.5 cm. There is thinning of the renal parenchyma and increased renal echogenicity. An echogenic structure in the interpolar region measuring 1.0 x 0.6 x 0.8 cm is likely a nonobstructing stone, and appears similar to prior renal ultrasound. No mass or hydronephrosis visualized. Left Kidney: Length: 10.4 cm. There is thinning of the renal parenchyma and increased echogenicity. There is an echogenic 1.2 x 0.9 x 1.4 cm in shadowing structure in the interpolar region. No mass or hydronephrosis visualized. Bladder: Appears normal for degree of bladder distention. IMPRESSION: 1. Bilateral nonobstructing renal calculi. 2. Thinning of the renal parenchyma and increased renal echogenicity consistent with chronic medical renal disease. 3. No hydronephrosis. Electronically Signed   By: Rubye Oaks M.D.   On: 11/12/2015 05:41   Dg Chest Port 1 View  11/14/2015  CLINICAL DATA:  Shortness of breath.  History of COPD. EXAM: PORTABLE CHEST 1 VIEW COMPARISON:  Single view of the chest 11/13/2015 and  11/12/2015. CT chest 09/20/2013. FINDINGS: Cardiomegaly is again seen. Small bilateral pleural effusions are present, greater on the right. There is mild interstitial edema. Right mid lung zone and right basilar atelectasis noted. IMPRESSION: Some increase in a small right pleural effusion with a trace left effusion now seen. Cardiomegaly and mild interstitial edema without marked interval change. Electronically Signed   By: Drusilla Kanner M.D.   On: 11/14/2015 07:16   Dg Chest Port 1 View  11/13/2015  CLINICAL DATA:  Hypoxia and shortness of breath. EXAM: PORTABLE CHEST 1 VIEW COMPARISON:  Yesterday at 0215 hour FINDINGS: Cardiomegaly is unchanged. Progressive vascular congestion, mild pulmonary edema is unchanged. Suspect developing right pleural effusion. Bilateral lower lobe and right suprahilar linear atelectasis. No pneumothorax. IMPRESSION: Progressive vascular congestion and probable developing right pleural effusion. Mild pulmonary edema unchanged. Stable cardiomegaly. Electronically Signed   By: Rubye Oaks M.D.   On: 11/13/2015 03:42   Dg Chest Port 1 View  11/12/2015  CLINICAL DATA:  Acute onset of cough and shortness of breath. Initial encounter. EXAM: PORTABLE CHEST 1 VIEW COMPARISON:  Chest radiograph performed 10/26/2015 FINDINGS: The lungs are well-aerated. Vascular congestion is noted, with increased interstitial markings, concerning for mild interstitial edema. Underlying atelectasis is noted. There is no evidence of pleural effusion or pneumothorax. The cardiomediastinal silhouette is mildly enlarged. No acute osseous abnormalities are seen. IMPRESSION: Vascular congestion and mild cardiomegaly, with increased interstitial markings, concerning for mild interstitial edema. Underlying atelectasis noted. Electronically Signed   By: Roanna Raider M.D.   On: 11/12/2015 02:44     CBC  Recent Labs Lab 11/12/15 0241 11/14/15 0412  WBC 8.4 10.9*  HGB 11.4* 11.1*  HCT 36.2 35.3*   PLT 270 249  MCV 94.5 93.1  MCH 29.8 29.3  MCHC 31.5 31.4  RDW 15.0 14.5  LYMPHSABS 1.1  --   MONOABS 1.1*  --   EOSABS 0.1  --   BASOSABS 0.0  --     Chemistries   Recent Labs Lab 11/12/15 0241 11/13/15 0406 11/14/15 0412  NA  136 135 136  K 4.6 4.7 4.1  CL 98* 99* 97*  CO2 GLUCOSE 100* 124* 137*  BUN 33* 31* 28*  CREATININE 2.60* 2.09* 1.89*  CALCIUM 8.5* 8.6* 8.3*  MG  --   --  1.7  AST 20  --   --   ALT 13*  --   --   ALKPHOS 43  --   --   BILITOT 0.8  --   --    ------------------------------------------------------------------------------------------------------------------ estimated creatinine clearance is 21.6 mL/min (by C-G formula based on Cr of 1.89). ------------------------------------------------------------------------------------------------------------------ No results for input(s): HGBA1C in the last 72 hours. ------------------------------------------------------------------------------------------------------------------ No results for input(s): CHOL, HDL, LDLCALC, TRIG, CHOLHDL, LDLDIRECT in the last 72 hours. ------------------------------------------------------------------------------------------------------------------ No results for input(s): TSH, T4TOTAL, T3FREE, THYROIDAB in the last 72 hours.  Invalid input(s): FREET3 ------------------------------------------------------------------------------------------------------------------ No results for input(s): VITAMINB12, FOLATE, FERRITIN, TIBC, IRON, RETICCTPCT in the last 72 hours.  Coagulation profile  Recent Labs Lab 11/10/15 1241 11/12/15 0241 11/12/15 0719 11/13/15 0406 11/14/15 0412  INR 3.6 2.81* 2.60* 2.25* 2.28*    No results for input(s): DDIMER in the last 72 hours.  Cardiac Enzymes No results for input(s): CKMB, TROPONINI, MYOGLOBIN in the last 168 hours.  Invalid input(s):  CK ------------------------------------------------------------------------------------------------------------------ Invalid input(s): POCBNP   Time Spent in minutes  35   SINGH,PRASHANT K M.D on 11/14/2015 at 9:53 AM  Between 7am to 7pm - Pager - [phone]  After 7pm go to [email or web address] - password [facility]  [facility] -  Office  [phone]

## 2015-11-14 NOTE — Clinical Social Work Note (Signed)
CSW spoke with patient and son.  [facility] [phone] once medically stable; however, patient is now refusing SNF.    If patient is agreeable to [facility] and is discharged over the weekend, the weekend admission's coordinator can be reached at [phone].  Otherwise, son states patient will move in with him and [facility]  [facility] commode.  Vickii PennaGina Callan Norden, LCSW [phone]  [facility] Licensed Clinical Social Worker

## 2015-11-14 NOTE — Progress Notes (Addendum)
0242- Pt having frequent 3-4 beat runs of PVC's. Dr. Tresa EndoKelly paged to make him aware.   [id number]- No new orders given at this time. Pt has current orders for labs to be drawn this am. RN will continue to monitor.  Roselie AwkwardMegan, Angus Amini, RN

## 2015-11-15 LAB — GLUCOSE, CAPILLARY
Glucose-Capillary: 106 mg/dL — ABNORMAL HIGH (ref 65–99)
Glucose-Capillary: 109 mg/dL — ABNORMAL HIGH (ref 65–99)
Glucose-Capillary: 131 mg/dL — ABNORMAL HIGH (ref 65–99)
Glucose-Capillary: 155 mg/dL — ABNORMAL HIGH (ref 65–99)

## 2015-11-15 LAB — PROTIME-INR
INR: 2.29 — ABNORMAL HIGH (ref 0.00–1.49)
Prothrombin Time: 25 seconds — ABNORMAL HIGH (ref 11.6–15.2)

## 2015-11-15 MED ORDER — FUROSEMIDE 10 MG/ML IJ SOLN
40.0000 mg | Freq: Two times a day (BID) | INTRAMUSCULAR | Status: DC
  Administered 2015-11-15: 40 mg via INTRAVENOUS
  Filled 2015-11-15: qty 4

## 2015-11-15 MED ORDER — TORSEMIDE 20 MG PO TABS
20.0000 mg | ORAL_TABLET | Freq: Every day | ORAL | Status: DC
  Administered 2015-11-15: 20 mg via ORAL
  Filled 2015-11-15: qty 1

## 2015-11-15 NOTE — Progress Notes (Signed)
Patient Demographics:    [name], is a 81 y.o. female, DOB - [birth year], ZOX:[id number]  Admit date - 11/12/2015   Admitting Physician Lorretta Harp, MD  Outpatient Primary MD for the patient is Bennie Pierini, [name]  LOS - 3   No chief complaint on file.       Subjective:    [employer] today has, No headache, No chest pain, No abdominal pain - No Nausea, No new weakness tingling or numbness, improved cough and shortness of breath.   Assessment  & Plan :    1. Generalized weakness with dehydration and acute renal failure. Resolved with hydration initially.    2. Acute hypoxic respiratory failure due to Acute on chronic grade 3 diastolic dysfunction with EF around 55%. She is currently on combination of beta blocker and Cardizem for rate control as per her primary cardiologist which will be continued, have added Nitropaste and Lasix. Cardiology has been following. We'll continue to monitor. Continue oxygen supplementation and applies a treatments. She is on 2 L nasal cannula oxygen at home. Feels much better this morning.   3. Essential hypertension. Continue Cardizem and metoprolol.   4. Underlying CAD. No acute issues. Continue statin, Coumadin, beta blocker for secondary prevention.   5. Chronic atrial fibrillation CHA2DS2-VASc Score is 6, - beta blocker and Cardizem for rate control, continue Coumadin. Pharmacy monitoring. Cardiology on board.   6. GERD. On PPI continue.   7. Dyslipidemia. Continued on home dose statin.   8. Mild productive cough. Likely due to underlying URI, added Levaquin and monitor. Chest x-ray shows no infiltrate.   9. History of COPD. Minimal wheezing likely due to pulmonary congestion is moving good amounts of air bilaterally. We will monitor with  supportive care.   10. 1 out of 2 blood cultures positive for cocoa bacilli. Most likely contamination. Discussed with ID over the phone. We will monitor till final speciation.    11. DM type II. On sliding scale monitor.  CBG (last 3)   Recent Labs  11/14/15 1558 11/14/15 2059 11/15/15 0623  GLUCAP 155* 102* 106*    Lab Results  Component Value Date   HGBA1C 6.4* 09/17/2015     Code Status : DNR  Family Communication  : None present  Disposition Plan  : HHPT she has refused SNF and has the capacity to make decision  Consults  :  Cards  Procedures  :      DVT Prophylaxis  :  Coaumdin  Lab Results  Component Value Date   INR 2.29* 11/15/2015   INR 2.28* 11/14/2015   INR 2.25* 11/13/2015     Lab Results  Component Value Date   PLT 249 11/14/2015    Inpatient Medications  Scheduled Meds: . atorvastatin  40 mg Oral q1800  . busPIRone  7.5 mg Oral BID  . diltiazem  360 mg Oral Daily  . fluticasone  2 spray Each Nare Daily  . insulin aspart  0-9 Units Subcutaneous TID WC  . levofloxacin  500 mg Oral Q48H  . loratadine  10 mg Oral Daily  . metoprolol  100 mg Oral BID  . mometasone-formoterol  2 puff Inhalation BID  . pantoprazole  40 mg Oral Daily  . sodium  chloride  3 mL Intravenous Q12H  . tiotropium  18 mcg Inhalation Daily  . torsemide  20 mg Oral Daily  . warfarin  5 mg Oral Q T,Th,Sat-1800   And  . warfarin  2.5 mg Oral Q M,W,F,Su-1800  . Warfarin - Pharmacist Dosing Inpatient   Does not apply q1800   Continuous Infusions:  PRN Meds:.acetaminophen **OR** [DISCONTINUED] acetaminophen, albuterol, HYDROcodone-acetaminophen, metoprolol, nitroGLYCERIN  Antibiotics  :    Anti-infectives    Start     Dose/Rate Route Frequency Ordered Stop   11/15/15 1200  levofloxacin (LEVAQUIN) tablet 500 mg     500 mg Oral Every 48 hours 11/14/15 0911     11/13/15 1200  levofloxacin (LEVAQUIN) tablet 750 mg  Status:  Discontinued     750 mg Oral Every 48  hours 11/13/15 1018 11/14/15 0911        Objective:   Filed Vitals:   11/14/15 1925 11/14/15 2014 11/15/15 0605 11/15/15 0859  BP: 126/72  116/64   Pulse: 83  94   Temp: 98.1 F (36.7 C)  98.2 F (36.8 C)   TempSrc: Oral  Oral   Resp: 18  18   Height:      Weight:   76.749 kg (169 lb 3.2 oz)   SpO2: 93% 94% 92% 94%    Wt Readings from Last 3 Encounters:  11/15/15 76.749 kg (169 lb 3.2 oz)  11/10/15 70.761 kg (156 lb)  11/03/15 70.943 kg (156 lb 6.4 oz)     Intake/Output Summary (Last 24 hours) at 11/15/15 0924 Last data filed at 11/14/15 1941  Gross per 24 hour  Intake    243 ml  Output   1200 ml  Net   -957 ml     Physical Exam  Awake Alert, Oriented X 3, No new F.N deficits, Normal affect [place].AT,PERRAL Supple Neck,No JVD, No cervical lymphadenopathy appriciated.  Symmetrical Chest wall movement, Good air movement bilaterally, +ve rales iRRR,No Gallops,Rubs or new Murmurs, No Parasternal Heave +ve B.Sounds, Abd Soft, No tenderness, No organomegaly appriciated, No rebound - guarding or rigidity. No Cyanosis, Clubbing or edema, No new Rash or bruise       Data Review:   Micro Results Recent Results (from the past 240 hour(s))  Urine culture     Status: None   Collection Time: 11/12/15  1:24 AM  Result Value Ref Range Status   Specimen Description URINE, CLEAN CATCH  Final   Special Requests NONE  Final   Culture >=100,000 COLONIES/mL YEAST  Final   Report Status 11/14/2015 FINAL  Final  Culture, blood (routine x 2)     Status: None (Preliminary result)   Collection Time: 11/12/15  2:39 AM  Result Value Ref Range Status   Specimen Description BLOOD RIGHT ANTECUBITAL  Final   Special Requests BOTTLES DRAWN AEROBIC AND ANAEROBIC 5CC   Final   Culture NO GROWTH 2 DAYS  Final   Report Status PENDING  Incomplete  Culture, blood (routine x 2)     Status: None (Preliminary result)   Collection Time: 11/12/15  2:41 AM  Result Value Ref Range Status   Specimen  Description BLOOD RIGHT WRIST  Final   Special Requests BOTTLES DRAWN AEROBIC AND ANAEROBIC 10CC   Final   Culture  Setup Time   Final    GRAM POSITIVE COCCOBACILLUS AEROBIC BOTTLE ONLY CRITICAL RESULT CALLED TO, READ BACK BY AND VERIFIED WITH: MEGAN  11/15/15 MKELLY    Culture CULTURE REINCUBATED FOR BETTER GROWTH  Final   Report Status PENDING  Incomplete    Radiology Reports Dg Chest 2 View  10/26/2015  CLINICAL DATA:  right leg edema.  Fluid leaking from foot for 1 day EXAM: CHEST  2 VIEW COMPARISON:  10/09/2015 FINDINGS: There is moderate cardiac enlargement. Aortic atherosclerosis noted. Atelectasis is noted in the right base. There is a small right pleural effusion identified. No interstitial edema. Multi level degenerative disc disease identified within the thoracic spine. IMPRESSION: 1. Cardiac enlargement, aortic atherosclerosis and small right pleural effusion. 2. Right upper lobe atelectasis. Electronically Signed   By: Signa Kell M.D.   On: 10/26/2015 14:49   US Renal  11/12/2015  CLINICAL DATA:  Acute kidney injury. EXAM: RENAL / URINARY TRACT ULTRASOUND COMPLETE COMPARISON:  Renal ultrasound 04/23/2010 FINDINGS: Right Kidney: Length: 9.5 cm. There is thinning of the renal parenchyma and increased renal echogenicity. An echogenic structure in the interpolar region measuring 1.0 x 0.6 x 0.8 cm is likely a nonobstructing stone, and appears similar to prior renal ultrasound. No mass or hydronephrosis visualized. Left Kidney: Length: 10.4 cm. There is thinning of the renal parenchyma and increased echogenicity. There is an echogenic 1.2 x 0.9 x 1.4 cm in shadowing structure in the interpolar region. No mass or hydronephrosis visualized. Bladder: Appears normal for degree of bladder distention. IMPRESSION: 1. Bilateral nonobstructing renal calculi. 2. Thinning of the renal parenchyma and increased renal echogenicity consistent with chronic medical renal disease. 3. No  hydronephrosis. Electronically Signed   By: Rubye Oaks M.D.   On: 11/12/2015 05:41   Dg Chest Port 1 View  11/14/2015  CLINICAL DATA:  Shortness of breath.  History of COPD. EXAM: PORTABLE CHEST 1 VIEW COMPARISON:  Single view of the chest 11/13/2015 and 11/12/2015. CT chest 09/20/2013. FINDINGS: Cardiomegaly is again seen. Small bilateral pleural effusions are present, greater on the right. There is mild interstitial edema. Right mid lung zone and right basilar atelectasis noted. IMPRESSION: Some increase in a small right pleural effusion with a trace left effusion now seen. Cardiomegaly and mild interstitial edema without marked interval change. Electronically Signed   By: Drusilla Kanner M.D.   On: 11/14/2015 07:16   Dg Chest Port 1 View  11/13/2015  CLINICAL DATA:  Hypoxia and shortness of breath. EXAM: PORTABLE CHEST 1 VIEW COMPARISON:  Yesterday at 0215 hour FINDINGS: Cardiomegaly is unchanged. Progressive vascular congestion, mild pulmonary edema is unchanged. Suspect developing right pleural effusion. Bilateral lower lobe and right suprahilar linear atelectasis. No pneumothorax. IMPRESSION: Progressive vascular congestion and probable developing right pleural effusion. Mild pulmonary edema unchanged. Stable cardiomegaly. Electronically Signed   By: Rubye Oaks M.D.   On: 11/13/2015 03:42   Dg Chest Port 1 View  11/12/2015  CLINICAL DATA:  Acute onset of cough and shortness of breath. Initial encounter. EXAM: PORTABLE CHEST 1 VIEW COMPARISON:  Chest radiograph performed 10/26/2015 FINDINGS: The lungs are well-aerated. Vascular congestion is noted, with increased interstitial markings, concerning for mild interstitial edema. Underlying atelectasis is noted. There is no evidence of pleural effusion or pneumothorax. The cardiomediastinal silhouette is mildly enlarged. No acute osseous abnormalities are seen. IMPRESSION: Vascular congestion and mild cardiomegaly, with increased  interstitial markings, concerning for mild interstitial edema. Underlying atelectasis noted. Electronically Signed   By: Roanna Raider M.D.   On: 11/12/2015 02:44     CBC  Recent Labs Lab 11/12/15 0241 11/14/15 0412  WBC 8.4 10.9*  HGB 11.4* 11.1*  HCT 36.2 35.3*  PLT 270 249  MCV  94.5 93.1  MCH 29.8 29.3  MCHC 31.5 31.4  RDW 15.0 14.5  LYMPHSABS 1.1  --   MONOABS 1.1*  --   EOSABS 0.1  --   BASOSABS 0.0  --     Chemistries   Recent Labs Lab 11/12/15 0241 11/13/15 0406 11/14/15 0412  NA 136 135 136  K 4.6 4.7 4.1  CL 98* 99* 97*  CO2 29 29 30   GLUCOSE 100* 124* 137*  BUN 33* 31* 28*  CREATININE 2.60* 2.09* 1.89*  CALCIUM 8.5* 8.6* 8.3*  MG  --   --  1.7  AST 20  --   --   ALT 13*  --   --   ALKPHOS 43  --   --   BILITOT 0.8  --   --    ------------------------------------------------------------------------------------------------------------------ estimated creatinine clearance is 21.7 mL/min (by C-G formula based on Cr of 1.89). ------------------------------------------------------------------------------------------------------------------ No results for input(s): HGBA1C in the last 72 hours. ------------------------------------------------------------------------------------------------------------------ No results for input(s): CHOL, HDL, LDLCALC, TRIG, CHOLHDL, LDLDIRECT in the last 72 hours. ------------------------------------------------------------------------------------------------------------------ No results for input(s): TSH, T4TOTAL, T3FREE, THYROIDAB in the last 72 hours.  Invalid input(s): FREET3 ------------------------------------------------------------------------------------------------------------------ No results for input(s): VITAMINB12, FOLATE, FERRITIN, TIBC, IRON, RETICCTPCT in the last 72 hours.  Coagulation profile  Recent Labs Lab 11/12/15 0241 11/12/15 0719 11/13/15 0406 11/14/15 0412 11/15/15 0307  INR 2.81* 2.60*  2.25* 2.28* 2.29*    No results for input(s): DDIMER in the last 72 hours.  Cardiac Enzymes No results for input(s): CKMB, TROPONINI, MYOGLOBIN in the last 168 hours.  Invalid input(s): CK ------------------------------------------------------------------------------------------------------------------ Invalid input(s): POCBNP   Time Spent in minutes  35   Creig Landin K M.D on 11/15/2015 at 9:24 AM  Between 7am to 7pm - Pager - [phone]  After 7pm go to [email or web address] - password [email or web address]  [facility] -  Office  [phone]

## 2015-11-15 NOTE — Progress Notes (Signed)
RN received call from lab that blood cultures were positive for Gram positive coccobacillus (Aerobic).  Dr. Thedore MinsSingh paged made aware. No new orders given. RN will continue to monitor.   Roselie AwkwardMegan, Idonia Zollinger, RN

## 2015-11-15 NOTE — Progress Notes (Signed)
No change in d/c plan. Patient is not medically stable for d/c per Dr. Thedore MinsSingh- he is aware that patient is refusing SNF and will return home when stable. CSW will sign off but will be available should d/c plan change.  Lorri Frederickonna T. Jaci LazierCrowder, KentuckyLCSW [phone] (weekend coverage)

## 2015-11-15 NOTE — Progress Notes (Signed)
Occupational Therapy Treatment Patient Details Name: [name] MRN: [id number] DOB: 1933/05/11 Today's Date: 11/15/2015    History of present illness Patient is a 81 y/o female presents with generalized weakness and dehydration, found to be in A Fib with RVR. PMH includes COPD on 2L 02 at home, CAD s/p PCI x2, A-fib on warfarin, CKD stage IV, chronic diastolic CHF, and smoking.   OT comments  Pt. Continues to require verbal and tactile instuctional cues for safety and o2 tube and line management.  Demonstrates poor safety awareness but when mentioned pt. Is dismissive of it and continues to operate that she will be fine and is eager for home.  Continue to recommend 24/7 S for safety.    Follow Up Recommendations  SNF;Supervision/Assistance - 24 hour    Equipment Recommendations  3 in 1 bedside comode    Recommendations for Other Services      Precautions / Restrictions Precautions Precautions: Fall Precaution Comments: monitor 02 Restrictions Weight Bearing Restrictions: No       Mobility Bed Mobility               General bed mobility comments: standing upon arrival  Transfers Overall transfer level: Needs assistance Equipment used: Rolling walker (2 wheeled)   Sit to Stand: Min guard         General transfer comment: requires use of bil Ue to complete task. Pt needs min (A) to manage oxygen tubes    Balance                                   ADL Overall ADL's : Needs assistance/impaired                         Toilet Transfer: Minimal assistance;Cueing for safety;Cueing for sequencing;Stand-pivot;BSC;RW Toilet Transfer Details (indicate cue type and reason): cues for tube and line management during pivot Toileting- Clothing Manipulation and Hygiene: Min guard;Sit to/from stand;Cueing for sequencing       Functional mobility during ADLs: Minimal assistance;Rolling walker;Cueing for safety General ADL Comments: remains  confused.  when asked "do you know why you need to organize your o2 tubing before you stand" pt. states "yeah cause they said i needed to".  max cues to manage tubes and lines.  stood without warning to begin pulling up pants.  cont. to say "how do you work this phone"  provided verbal instructions.  located  nephew's phone number and assisted pt. with making phone call.      Vision                     Perception     Praxis      Cognition   Behavior During Therapy: [facility] for tasks assessed/performed Overall Cognitive Status: Impaired/Different from baseline Area of Impairment: Awareness;Safety/judgement;Following commands     Memory: Decreased short-term memory  Following Commands: Follows multi-step commands with increased time Safety/Judgement: Decreased awareness of safety;Decreased awareness of deficits Awareness: Emergent   General Comments: Pt with lack of awareness to fall risk and need for (A) for initial d/c home    Extremity/Trunk Assessment               Exercises     Shoulder Instructions       General Comments      Pertinent Vitals/ Pain          Home  Living                                          Prior Functioning/Environment              Frequency Min 2X/week     Progress Toward Goals  OT Goals(current goals can now be found in the care plan section)  Progress towards OT goals: Progressing toward goals     Plan Discharge plan remains appropriate    Co-evaluation                 End of Session Equipment Utilized During Treatment: Gait belt;Rolling walker;Oxygen   Activity Tolerance Patient tolerated treatment well   Patient Left in chair;with call bell/phone within reach;with chair alarm set   Nurse Communication Mobility status;Precautions;Other (comment) (discussed with CNA need for chair alarm for pt.)        Time: [phone] OT Time Calculation (min): 19 min  Charges: OT General  Charges $OT Visit: 1 Procedure OT Treatments $Self Care/Home Management : 8-22 mins  Robet LeuMorris, Jonny Longino [place], COTA/L 11/15/2015, 10:01 AM

## 2015-11-15 NOTE — Care Management Important Message (Signed)
Important Message  Patient Details  Name: Katie FergusonDessie A Sottile MRN: [id number] Date of Birth: 1933/08/25   Medicare Important Message Given:  Yes    Isaias Cowmanliveras-Aizpurua, Koven Belinsky, RN 11/15/2015, 10:27 AM

## 2015-11-15 NOTE — Progress Notes (Signed)
ANTICOAGULATION CONSULT NOTE - Follow Up Consult  Pharmacy Consult for Coumadin Indication: atrial fibrillation  No Known Allergies  Patient Measurements: Height: 5' (152.4 cm) Weight: 169 lb 3.2 oz (76.749 kg) IBW/kg (Calculated) : 45.5  Vital Signs: Temp: 98.2 F (36.8 C) (11/19 0605) Temp Source: Oral (11/19 0605) BP: 115/69 mmHg (11/19 0900) Pulse Rate: 109 (11/19 0955)  Labs:  Recent Labs  11/13/15 0406 11/14/15 0412 11/15/15 0307  HGB  --  11.1*  --   HCT  --  35.3*  --   PLT  --  249  --   LABPROT 24.6* 24.9* 25.0*  INR 2.25* 2.28* 2.29*  CREATININE 2.09* 1.89*  --     Estimated Creatinine Clearance: 21.7 mL/min (by C-G formula based on Cr of 1.89).  Assessment: 82yof continues on coumadin as pta for afib. INR remains therapeutic. CBC is stable. No bleeding reported. Also continues on day #3 levaquin which can potentiate INR, but no drug interaction seen yet.  Goal of Therapy:  INR 2-3 Monitor platelets by anticoagulation protocol: Yes   Plan:  1) Continue coumadin 2.5mg  daily except 5mg  T/Th/Sat 2) Daily INR  Fredrik RiggerMarkle, Vinicio Lynk Sue 11/15/2015,10:49 AM

## 2015-11-15 NOTE — Progress Notes (Signed)
Patient Name: [name] Date of Encounter: 11/15/2015   SUBJECTIVE  Denies dyspnea or chest pain; pedal edema improving  CURRENT MEDS . atorvastatin  40 mg Oral q1800  . busPIRone  7.5 mg Oral BID  . diltiazem  360 mg Oral Daily  . fluticasone  2 spray Each Nare Daily  . furosemide  40 mg Intravenous BID  . insulin aspart  0-9 Units Subcutaneous TID WC  . levofloxacin  500 mg Oral Q48H  . loratadine  10 mg Oral Daily  . metoprolol  100 mg Oral BID  . mometasone-formoterol  2 puff Inhalation BID  . nitroGLYCERIN  0.5 inch Topical 4 times per day  . pantoprazole  40 mg Oral Daily  . sodium chloride  3 mL Intravenous Q12H  . tiotropium  18 mcg Inhalation Daily  . warfarin  5 mg Oral Q T,Th,Sat-1800   And  . warfarin  2.5 mg Oral Q M,W,F,Su-1800  . Warfarin - Pharmacist Dosing Inpatient   Does not apply q1800    OBJECTIVE  Filed Vitals:   11/14/15 1110 11/14/15 1925 11/14/15 2014 11/15/15 0605  BP:  126/72  116/64  Pulse: 107 83  94  Temp:  98.1 F (36.7 C)  98.2 F (36.8 C)  TempSrc:  Oral  Oral  Resp:  18  18  Height:      Weight:    76.749 kg (169 lb 3.2 oz)  SpO2:  93% 94% 92%    Intake/Output Summary (Last 24 hours) at 11/15/15 0808 Last data filed at 11/14/15 1941  Gross per 24 hour  Intake    243 ml  Output   1200 ml  Net   -957 ml   Filed Weights   11/13/15 0311 11/14/15 0419 11/15/15 0605  Weight: 73.982 kg (163 lb 1.6 oz) 75.66 kg (166 lb 12.8 oz) 76.749 kg (169 lb 3.2 oz)    PHYSICAL EXAM  General: Pleasant, chronically ill appearing female in NAD.  Neuro: Grossly intact HEENT:  Normal  Neck: Supple Lungs:  Diminished BS throughout Heart: irregular Abdomen: Soft, non-tender, non-distended Extremities: trace edema   Accessory Clinical Findings  CBC  Recent Labs  11/14/15 0412  WBC 10.9*  HGB 11.1*  HCT 35.3*  MCV 93.1  PLT 249   Basic Metabolic Panel  Recent Labs  11/13/15 0406 11/14/15 0412  NA 135 136  K 4.7 4.1    CL 99* 97*  CO2 29 30  GLUCOSE 124* 137*  BUN 31* 28*  CREATININE 2.09* 1.89*  CALCIUM 8.6* 8.3*  MG  --  1.7    TELE  afib at rate of 90-110s.   Radiology/Studies  Dg Chest 2 View  10/26/2015  CLINICAL DATA:  right leg edema.  Fluid leaking from foot for 1 day EXAM: CHEST  2 VIEW COMPARISON:  10/09/2015 FINDINGS: There is moderate cardiac enlargement. Aortic atherosclerosis noted. Atelectasis is noted in the right base. There is a small right pleural effusion identified. No interstitial edema. Multi level degenerative disc disease identified within the thoracic spine. IMPRESSION: 1. Cardiac enlargement, aortic atherosclerosis and small right pleural effusion. 2. Right upper lobe atelectasis. Electronically Signed   By: Signa Kellaylor  Stroud M.D.   On: 10/26/2015 14:49   [place] Renal  11/12/2015  CLINICAL DATA:  Acute kidney injury. EXAM: RENAL / URINARY TRACT ULTRASOUND COMPLETE COMPARISON:  Renal ultrasound 04/23/2010 FINDINGS: Right Kidney: Length: 9.5 cm. There is thinning of the renal parenchyma and increased renal echogenicity. An echogenic structure  in the interpolar region measuring 1.0 x 0.6 x 0.8 cm is likely a nonobstructing stone, and appears similar to prior renal ultrasound. No mass or hydronephrosis visualized. Left Kidney: Length: 10.4 cm. There is thinning of the renal parenchyma and increased echogenicity. There is an echogenic 1.2 x 0.9 x 1.4 cm in shadowing structure in the interpolar region. No mass or hydronephrosis visualized. Bladder: Appears normal for degree of bladder distention. IMPRESSION: 1. Bilateral nonobstructing renal calculi. 2. Thinning of the renal parenchyma and increased renal echogenicity consistent with chronic medical renal disease. 3. No hydronephrosis. Electronically Signed   By: Rubye Oaks M.D.   On: 11/12/2015 05:41   Dg Chest Port 1 View  11/14/2015  CLINICAL DATA:  Shortness of breath.  History of COPD. EXAM: PORTABLE CHEST 1 VIEW COMPARISON:   Single view of the chest 11/13/2015 and 11/12/2015. CT chest 09/20/2013. FINDINGS: Cardiomegaly is again seen. Small bilateral pleural effusions are present, greater on the right. There is mild interstitial edema. Right mid lung zone and right basilar atelectasis noted. IMPRESSION: Some increase in a small right pleural effusion with a trace left effusion now seen. Cardiomegaly and mild interstitial edema without marked interval change. Electronically Signed   By: Drusilla Kanner M.D.   On: 11/14/2015 07:16   Dg Chest Port 1 View  11/13/2015  CLINICAL DATA:  Hypoxia and shortness of breath. EXAM: PORTABLE CHEST 1 VIEW COMPARISON:  Yesterday at 0215 hour FINDINGS: Cardiomegaly is unchanged. Progressive vascular congestion, mild pulmonary edema is unchanged. Suspect developing right pleural effusion. Bilateral lower lobe and right suprahilar linear atelectasis. No pneumothorax. IMPRESSION: Progressive vascular congestion and probable developing right pleural effusion. Mild pulmonary edema unchanged. Stable cardiomegaly. Electronically Signed   By: Rubye Oaks M.D.   On: 11/13/2015 03:42   Dg Chest Port 1 View  11/12/2015  CLINICAL DATA:  Acute onset of cough and shortness of breath. Initial encounter. EXAM: PORTABLE CHEST 1 VIEW COMPARISON:  Chest radiograph performed 10/26/2015 FINDINGS: The lungs are well-aerated. Vascular congestion is noted, with increased interstitial markings, concerning for mild interstitial edema. Underlying atelectasis is noted. There is no evidence of pleural effusion or pneumothorax. The cardiomediastinal silhouette is mildly enlarged. No acute osseous abnormalities are seen. IMPRESSION: Vascular congestion and mild cardiomegaly, with increased interstitial markings, concerning for mild interstitial edema. Underlying atelectasis noted. Electronically Signed   By: Roanna Raider M.D.   On: 11/12/2015 02:44    ASSESSMENT AND PLAN  Generalized weakness - per IM - pt to go  to [facility] after d/c   Acute on Chronic diastolic CHF (congestive heart failure) (HCC) - strict I/O (-1117 yesterday) and daily weights.  - Echo 07/30/15 showed Grade 3-4 diastolic dysfunction. Severe LA and moderate RA enlargement LVEF 50-55%. Aortic valve calcification with mildly restricted leaflet movement and no evidence of aortic stenosis by doppler. Peak gradient 12 mmHg, mean gradient 6 mmHg. Peak velocity 1.7 m/s Moderate mitral annular calcification  - plan DC IV lasix and begin demadex 20 mg daily; follow renal function.  Atrial fibrillation, RVR (HCC) - on home doses of Cardizem 360 mg and metoprolol 100 mg bid - Tele showed rate of 90-100s w/ frequent PVCs/NSVT vs aberrancy - Coumadin per pharmacy for anticoagulation  CAD (coronary artery disease), native coronary artery - last cath report 2006 showed non obstructive CAD - seen 07/2015 for chest pain during hospitalization but refused cath at that time - continue statin and BB, no ASA since on coumadin, EF normal,  DC nitrates  Essential hypertension, benign - BP improved. Now stable.  Bacteremia  - management per primary care Patient to FU with Dr Antoine Poche following DC.  Signed, Olga Millers MD

## 2015-11-16 LAB — BASIC METABOLIC PANEL
Anion gap: 8 (ref 5–15)
BUN: 28 mg/dL — ABNORMAL HIGH (ref 6–20)
CO2: 36 mmol/L — ABNORMAL HIGH (ref 22–32)
Calcium: 8.8 mg/dL — ABNORMAL LOW (ref 8.9–10.3)
Chloride: 94 mmol/L — ABNORMAL LOW (ref 101–111)
Creatinine, Ser: 1.99 mg/dL — ABNORMAL HIGH (ref 0.44–1.00)
GFR calc Af Amer: 26 mL/min — ABNORMAL LOW (ref >60)
GFR calc non Af Amer: 23 mL/min — ABNORMAL LOW (ref >60)
Glucose, Bld: 103 mg/dL — ABNORMAL HIGH (ref 65–99)
Potassium: 3.2 mmol/L — ABNORMAL LOW (ref 3.5–5.1)
Sodium: 138 mmol/L (ref 135–145)

## 2015-11-16 LAB — CULTURE, BLOOD (ROUTINE X 2)

## 2015-11-16 LAB — GLUCOSE, CAPILLARY
Glucose-Capillary: 107 mg/dL — ABNORMAL HIGH (ref 65–99)
Glucose-Capillary: 188 mg/dL — ABNORMAL HIGH (ref 65–99)
Glucose-Capillary: 80 mg/dL (ref 65–99)
Glucose-Capillary: 171 mg/dL — ABNORMAL HIGH (ref 65–99)

## 2015-11-16 LAB — PROTIME-INR
INR: 2.13 — ABNORMAL HIGH (ref 0.00–1.49)
Prothrombin Time: 23.6 seconds — ABNORMAL HIGH (ref 11.6–15.2)

## 2015-11-16 MED ORDER — MAGNESIUM SULFATE IN D5W 10-5 MG/ML-% IV SOLN
1.0000 g | Freq: Once | INTRAVENOUS | Status: AC
  Administered 2015-11-16: 1 g via INTRAVENOUS
  Filled 2015-11-16: qty 100

## 2015-11-16 MED ORDER — POTASSIUM CHLORIDE CRYS ER 20 MEQ PO TBCR
40.0000 meq | EXTENDED_RELEASE_TABLET | ORAL | Status: AC
  Administered 2015-11-16 (×2): 40 meq via ORAL
  Filled 2015-11-16 (×2): qty 2

## 2015-11-16 MED ORDER — TORSEMIDE 20 MG PO TABS
20.0000 mg | ORAL_TABLET | Freq: Two times a day (BID) | ORAL | Status: DC
  Administered 2015-11-16: 20 mg via ORAL
  Filled 2015-11-16: qty 1

## 2015-11-16 MED ORDER — FUROSEMIDE 40 MG PO TABS: 40.0000 mg | ORAL_TABLET | Freq: Two times a day (BID) | ORAL | Status: DC

## 2015-11-16 MED ORDER — FLUCONAZOLE 100 MG PO TABS
100.0000 mg | ORAL_TABLET | Freq: Once | ORAL | Status: AC
  Administered 2015-11-16: 100 mg via ORAL
  Filled 2015-11-16: qty 1

## 2015-11-16 MED ORDER — TORSEMIDE 20 MG PO TABS
20.0000 mg | ORAL_TABLET | Freq: Two times a day (BID) | ORAL | Status: DC
  Administered 2015-11-17: 20 mg via ORAL
  Filled 2015-11-16: qty 1

## 2015-11-16 NOTE — Progress Notes (Signed)
Patient Name: [name] Date of Encounter: 11/16/2015   SUBJECTIVE  Denies dyspnea or chest pain; pedal edema improving  CURRENT MEDS . atorvastatin  40 mg Oral q1800  . busPIRone  7.5 mg Oral BID  . diltiazem  360 mg Oral Daily  . fluticasone  2 spray Each Nare Daily  . insulin aspart  0-9 Units Subcutaneous TID WC  . levofloxacin  500 mg Oral Q48H  . loratadine  10 mg Oral Daily  . metoprolol  100 mg Oral BID  . mometasone-formoterol  2 puff Inhalation BID  . pantoprazole  40 mg Oral Daily  . potassium chloride  40 mEq Oral Q4H  . sodium chloride  3 mL Intravenous Q12H  . tiotropium  18 mcg Inhalation Daily  . torsemide  20 mg Oral BID  . warfarin  5 mg Oral Q T,Th,Sat-1800   And  . warfarin  2.5 mg Oral Q M,W,F,Su-1800  . Warfarin - Pharmacist Dosing Inpatient   Does not apply q1800    OBJECTIVE  Filed Vitals:   11/15/15 2037 11/16/15 0443 11/16/15 0610 11/16/15 0904  BP: 120/60 118/78    Pulse: 97 96    Temp: 98 F (36.7 C) 97.8 F (36.6 C)    TempSrc: Oral Oral    Resp: 18 16    Height:      Weight:   73.211 kg (161 lb 6.4 oz)   SpO2: 96% 94%  95%    Intake/Output Summary (Last 24 hours) at 11/16/15 0911 Last data filed at 11/16/15 0555  Gross per 24 hour  Intake    120 ml  Output   3900 ml  Net  -3780 ml   Filed Weights   11/14/15 0419 11/15/15 0605 11/16/15 0610  Weight: 75.66 kg (166 lb 12.8 oz) 76.749 kg (169 lb 3.2 oz) 73.211 kg (161 lb 6.4 oz)    PHYSICAL EXAM  General: Pleasant, chronically ill appearing female in NAD.  Neuro: Grossly intact HEENT:  Normal  Neck: Supple Lungs:  Diminished BS throughout Heart: irregular Abdomen: Soft, non-tender, non-distended Extremities: trace edema   Accessory Clinical Findings  CBC  Recent Labs  11/14/15 0412  WBC 10.9*  HGB 11.1*  HCT 35.3*  MCV 93.1  PLT 249   Basic Metabolic Panel  Recent Labs  11/14/15 0412 11/16/15 0237  NA 136 138  K 4.1 3.2*  CL 97* 94*  CO2 30  36*  GLUCOSE 137* 103*  BUN 28* 28*  CREATININE 1.89* 1.99*  CALCIUM 8.3* 8.8*  MG 1.7  --     TELE  afib at rate of 90-110s.   Radiology/Studies  Dg Chest 2 View  10/26/2015  CLINICAL DATA:  right leg edema.  Fluid leaking from foot for 1 day EXAM: CHEST  2 VIEW COMPARISON:  10/09/2015 FINDINGS: There is moderate cardiac enlargement. Aortic atherosclerosis noted. Atelectasis is noted in the right base. There is a small right pleural effusion identified. No interstitial edema. Multi level degenerative disc disease identified within the thoracic spine. IMPRESSION: 1. Cardiac enlargement, aortic atherosclerosis and small right pleural effusion. 2. Right upper lobe atelectasis. Electronically Signed   By: Signa Kellaylor  Stroud M.D.   On: 10/26/2015 14:49   [place] Renal  11/12/2015  CLINICAL DATA:  Acute kidney injury. EXAM: RENAL / URINARY TRACT ULTRASOUND COMPLETE COMPARISON:  Renal ultrasound 04/23/2010 FINDINGS: Right Kidney: Length: 9.5 cm. There is thinning of the renal parenchyma and increased renal echogenicity. An echogenic structure in the interpolar region  measuring 1.0 x 0.6 x 0.8 cm is likely a nonobstructing stone, and appears similar to prior renal ultrasound. No mass or hydronephrosis visualized. Left Kidney: Length: 10.4 cm. There is thinning of the renal parenchyma and increased echogenicity. There is an echogenic 1.2 x 0.9 x 1.4 cm in shadowing structure in the interpolar region. No mass or hydronephrosis visualized. Bladder: Appears normal for degree of bladder distention. IMPRESSION: 1. Bilateral nonobstructing renal calculi. 2. Thinning of the renal parenchyma and increased renal echogenicity consistent with chronic medical renal disease. 3. No hydronephrosis. Electronically Signed   By: Rubye Oaks M.D.   On: 11/12/2015 05:41   Dg Chest Port 1 View  11/14/2015  CLINICAL DATA:  Shortness of breath.  History of COPD. EXAM: PORTABLE CHEST 1 VIEW COMPARISON:  Single view of the chest  11/13/2015 and 11/12/2015. CT chest 09/20/2013. FINDINGS: Cardiomegaly is again seen. Small bilateral pleural effusions are present, greater on the right. There is mild interstitial edema. Right mid lung zone and right basilar atelectasis noted. IMPRESSION: Some increase in a small right pleural effusion with a trace left effusion now seen. Cardiomegaly and mild interstitial edema without marked interval change. Electronically Signed   By: Drusilla Kanner M.D.   On: 11/14/2015 07:16   Dg Chest Port 1 View  11/13/2015  CLINICAL DATA:  Hypoxia and shortness of breath. EXAM: PORTABLE CHEST 1 VIEW COMPARISON:  Yesterday at 0215 hour FINDINGS: Cardiomegaly is unchanged. Progressive vascular congestion, mild pulmonary edema is unchanged. Suspect developing right pleural effusion. Bilateral lower lobe and right suprahilar linear atelectasis. No pneumothorax. IMPRESSION: Progressive vascular congestion and probable developing right pleural effusion. Mild pulmonary edema unchanged. Stable cardiomegaly. Electronically Signed   By: Rubye Oaks M.D.   On: 11/13/2015 03:42   Dg Chest Port 1 View  11/12/2015  CLINICAL DATA:  Acute onset of cough and shortness of breath. Initial encounter. EXAM: PORTABLE CHEST 1 VIEW COMPARISON:  Chest radiograph performed 10/26/2015 FINDINGS: The lungs are well-aerated. Vascular congestion is noted, with increased interstitial markings, concerning for mild interstitial edema. Underlying atelectasis is noted. There is no evidence of pleural effusion or pneumothorax. The cardiomediastinal silhouette is mildly enlarged. No acute osseous abnormalities are seen. IMPRESSION: Vascular congestion and mild cardiomegaly, with increased interstitial markings, concerning for mild interstitial edema. Underlying atelectasis noted. Electronically Signed   By: Roanna Raider M.D.   On: 11/12/2015 02:44    ASSESSMENT AND PLAN  Generalized weakness - per IM - pt to go to [facility] and  Re[facility] after d/c   Acute on Chronic diastolic CHF (congestive heart failure) (HCC) - strict I/O (-3540 yesterday) and daily weights.  - Echo 07/30/15 showed Grade 3-4 diastolic dysfunction. Severe LA and moderate RA enlargement LVEF 50-55%. Aortic valve calcification with mildly restricted leaflet movement and no evidence of aortic stenosis by doppler. Peak gradient 12 mmHg, mean gradient 6 mmHg. Peak velocity 1.7 m/s Moderate mitral annular calcification  - continue demadex; follow renal function. May need to decrease demadex to 20 mg daily at DC pending fu renal function.  Atrial fibrillation, RVR (HCC) - on home doses of Cardizem 360 mg and metoprolol 100 mg bid - Tele rate controlled - Coumadin per pharmacy for anticoagulation  CAD (coronary artery disease), native coronary artery - last cath report 2006 showed non obstructive CAD - seen 07/2015 for chest pain during hospitalization but refused cath at that time - continue statin and BB, no ASA since on coumadin, EF normal  Essential hypertension, benign -  BP improved. Now stable.  Bacteremia  - management per primary care Patient to FU with Dr Antoine Poche following DC.  Signed, Olga Millers MD

## 2015-11-16 NOTE — Progress Notes (Signed)
ANTICOAGULATION CONSULT NOTE - Follow Up Consult  Pharmacy Consult for Coumadin Indication: atrial fibrillation  No Known Allergies  Patient Measurements: Height: 5' (152.4 cm) Weight: 161 lb 6.4 oz (73.211 kg) IBW/kg (Calculated) : 45.5  Vital Signs: Temp: 97.8 F (36.6 C) (11/20 0443) Temp Source: Oral (11/20 0443) BP: 111/60 mmHg (11/20 1003) Pulse Rate: 104 (11/20 1003)  Labs:  Recent Labs  11/14/15 0412 11/15/15 0307 11/16/15 0237  HGB 11.1*  --   --   HCT 35.3*  --   --   PLT 249  --   --   LABPROT 24.9* 25.0* 23.6*  INR 2.28* 2.29* 2.13*  CREATININE 1.89*  --  1.99*    Estimated Creatinine Clearance: 20.1 mL/min (by C-G formula based on Cr of 1.99).  Assessment: 82yof continues on coumadin as pta for afib. INR remains therapeutic. CBC is stable. No bleeding reported. Also continues on day #4 levaquin which can potentiate INR, but no drug interaction seen yet.  Goal of Therapy:  INR 2-3 Monitor platelets by anticoagulation protocol: Yes   Plan:  1) Continue coumadin 2.5mg  daily except 5mg  T/Th/Sat 2) Consider changing INR checks to MWF given stability of INR  Fredrik RiggerMarkle, Cael Worth Sue 11/16/2015,11:49 AM

## 2015-11-16 NOTE — Progress Notes (Signed)
Patient Demographics:    [name], is a 81 y.o. female, DOB - 1933-08-06, ZOX:[id number]  Admit date - 11/12/2015   Admitting Physician Lorretta Harp, MD  Outpatient Primary MD for the patient is Bennie Pierini, [name]  LOS - 4   No chief complaint on file.       Subjective:    [employer] today has, No headache, No chest pain, No abdominal pain - No Nausea, No new weakness tingling or numbness, improved cough and shortness of breath.   Assessment  & Plan :    1. Generalized weakness with dehydration and acute renal failure and chronic kidney disease stage III. Baseline creatinine close to 1.6. Resolved with hydration, now on close to home dose diuretic. Monitor BMP closely.   2. Acute hypoxic respiratory failure due to Acute on chronic grade 3 diastolic dysfunction with EF around 55%. She is currently on combination of beta blocker and Cardizem for rate control as per her primary cardiologist which will be continued, have added Nitropaste and Lasix. Cardiology has been following. We'll continue to monitor. Continue oxygen supplementation and applies a treatments. She is on 2 L nasal cannula oxygen at home. Feels much better this morning.   3. Essential hypertension. Continue Cardizem and metoprolol.   4. Underlying CAD. No acute issues. Continue statin, Coumadin, beta blocker for secondary prevention.   5. Chronic atrial fibrillation CHA2DS2-VASc Score is 6 - beta blocker and Cardizem for rate control, continue Coumadin. Pharmacy monitoring. Cardiology on board.   6. GERD. On PPI continue.   7. Dyslipidemia. Continued on home dose statin.   8. Mild productive cough. Likely due to underlying URI, added Levaquin and monitor. Chest x-ray shows no infiltrate.   9. History of COPD.  Minimal wheezing likely due to pulmonary congestion is moving good amounts of air bilaterally. We will monitor with supportive care.   10. 1 out of 2 blood cultures positive for cocoa bacilli. Most likely contamination. Discussed with ID over the phone. We will monitor till final speciation.    11. DM type II. On sliding scale monitor.  CBG (last 3)   Recent Labs  11/15/15 1647 11/15/15 2117 11/16/15 0624  GLUCAP 131* 155* 107*    Lab Results  Component Value Date   HGBA1C 6.4* 09/17/2015     Code Status : DNR  Family Communication  : None present  Disposition Plan  : HHPT she has refused SNF and has the capacity to make decision  Consults  :  Cards  Procedures  :      DVT Prophylaxis  :  Co[place]din  Lab Results  Component Value Date   INR 2.13* 11/16/2015   INR 2.29* 11/15/2015   INR 2.28* 11/14/2015     Lab Results  Component Value Date   PLT 249 11/14/2015    Inpatient Medications  Scheduled Meds: . atorvastatin  40 mg Oral q1800  . busPIRone  7.5 mg Oral BID  . diltiazem  360 mg Oral Daily  . fluticasone  2 spray Each Nare Daily  . insulin aspart  0-9 Units Subcutaneous TID WC  . levofloxacin  500 mg Oral Q48H  . loratadine  10 mg Oral Daily  . metoprolol  100 mg Oral BID  .  mometasone-formoterol  2 puff Inhalation BID  . pantoprazole  40 mg Oral Daily  . potassium chloride  40 mEq Oral Q4H  . sodium chloride  3 mL Intravenous Q12H  . tiotropium  18 mcg Inhalation Daily  . torsemide  20 mg Oral BID  . warfarin  5 mg Oral Q T,Th,Sat-1800   And  . warfarin  2.5 mg Oral Q M,W,F,Su-1800  . Warfarin - Pharmacist Dosing Inpatient   Does not apply q1800   Continuous Infusions:  PRN Meds:.acetaminophen **OR** [DISCONTINUED] acetaminophen, albuterol, HYDROcodone-acetaminophen, metoprolol, nitroGLYCERIN  Antibiotics  :    Anti-infectives    Start     Dose/Rate Route Frequency Ordered Stop   11/16/15 0615  fluconazole (DIFLUCAN) tablet 100 mg      100 mg Oral  Once 11/16/15 0603 11/16/15 0610   11/15/15 1200  levofloxacin (LEVAQUIN) tablet 500 mg     500 mg Oral Every 48 hours 11/14/15 0911     11/13/15 1200  levofloxacin (LEVAQUIN) tablet 750 mg  Status:  Discontinued     750 mg Oral Every 48 hours 11/13/15 1018 11/14/15 0911        Objective:   Filed Vitals:   11/15/15 2037 11/16/15 0443 11/16/15 0610 11/16/15 0904  BP: 120/60 118/78    Pulse: 97 96    Temp: 98 F (36.7 C) 97.8 F (36.6 C)    TempSrc: Oral Oral    Resp: 18 16    Height:      Weight:   73.211 kg (161 lb 6.4 oz)   SpO2: 96% 94%  95%    Wt Readings from Last 3 Encounters:  11/16/15 73.211 kg (161 lb 6.4 oz)  11/10/15 70.761 kg (156 lb)  11/03/15 70.943 kg (156 lb 6.4 oz)     Intake/Output Summary (Last 24 hours) at 11/16/15 0947 Last data filed at 11/16/15 0555  Gross per 24 hour  Intake    120 ml  Output   3900 ml  Net  -3780 ml     Physical Exam  Awake Alert, Oriented X 3, No new F.N deficits, Normal affect Kaser.AT,PERRAL Supple Neck,No JVD, No cervical lymphadenopathy appriciated.  Symmetrical Chest wall movement, Good air movement bilaterally, NO rales iRRR,No Gallops,Rubs or new Murmurs, No Parasternal Heave +ve B.Sounds, Abd Soft, No tenderness, No organomegaly appriciated, No rebound - guarding or rigidity. No Cyanosis, Clubbing or edema, No new Rash or bruise       Data Review:   Micro Results Recent Results (from the past 240 hour(s))  Urine culture     Status: None   Collection Time: 11/12/15  1:24 AM  Result Value Ref Range Status   Specimen Description URINE, CLEAN CATCH  Final   Special Requests NONE  Final   Culture >=100,000 COLONIES/mL YEAST  Final   Report Status 11/14/2015 FINAL  Final  Culture, blood (routine x 2)     Status: None (Preliminary result)   Collection Time: 11/12/15  2:39 AM  Result Value Ref Range Status   Specimen Description BLOOD RIGHT ANTECUBITAL  Final   Special Requests BOTTLES DRAWN  AEROBIC AND ANAEROBIC 5CC   Final   Culture NO GROWTH 3 DAYS  Final   Report Status PENDING  Incomplete  Culture, blood (routine x 2)     Status: None (Preliminary result)   Collection Time: 11/12/15  2:41 AM  Result Value Ref Range Status   Specimen Description BLOOD RIGHT WRIST  Final   Special Requests BOTTLES DRAWN  AEROBIC AND ANAEROBIC 10CC   Final   Culture  Setup Time   Final    GRAM POSITIVE COCCOBACILLUS AEROBIC BOTTLE ONLY CRITICAL RESULT CALLED TO, READ BACK BY AND VERIFIED WITH: MEGAN @0713  11/15/15 MKELLY    Culture CULTURE REINCUBATED FOR BETTER GROWTH  Final   Report Status PENDING  Incomplete    Radiology Reports Dg Chest 2 View  10/26/2015  CLINICAL DATA:  right leg edema.  Fluid leaking from foot for 1 day EXAM: CHEST  2 VIEW COMPARISON:  10/09/2015 FINDINGS: There is moderate cardiac enlargement. Aortic atherosclerosis noted. Atelectasis is noted in the right base. There is a small right pleural effusion identified. No interstitial edema. Multi level degenerative disc disease identified within the thoracic spine. IMPRESSION: 1. Cardiac enlargement, aortic atherosclerosis and small right pleural effusion. 2. Right upper lobe atelectasis. Electronically Signed   By: Signa Kellaylor  Stroud M.D.   On: 10/26/2015 14:49   [place] Renal  11/12/2015  CLINICAL DATA:  Acute kidney injury. EXAM: RENAL / URINARY TRACT ULTRASOUND COMPLETE COMPARISON:  Renal ultrasound 04/23/2010 FINDINGS: Right Kidney: Length: 9.5 cm. There is thinning of the renal parenchyma and increased renal echogenicity. An echogenic structure in the interpolar region measuring 1.0 x 0.6 x 0.8 cm is likely a nonobstructing stone, and appears similar to prior renal ultrasound. No mass or hydronephrosis visualized. Left Kidney: Length: 10.4 cm. There is thinning of the renal parenchyma and increased echogenicity. There is an echogenic 1.2 x 0.9 x 1.4 cm in shadowing structure in the interpolar region. No mass or hydronephrosis  visualized. Bladder: Appears normal for degree of bladder distention. IMPRESSION: 1. Bilateral nonobstructing renal calculi. 2. Thinning of the renal parenchyma and increased renal echogenicity consistent with chronic medical renal disease. 3. No hydronephrosis. Electronically Signed   By: Rubye OaksMelanie  Ehinger M.D.   On: 11/12/2015 05:41   Dg Chest Port 1 View  11/14/2015  CLINICAL DATA:  Shortness of breath.  History of COPD. EXAM: PORTABLE CHEST 1 VIEW COMPARISON:  Single view of the chest 11/13/2015 and 11/12/2015. CT chest 09/20/2013. FINDINGS: Cardiomegaly is again seen. Small bilateral pleural effusions are present, greater on the right. There is mild interstitial edema. Right mid lung zone and right basilar atelectasis noted. IMPRESSION: Some increase in a small right pleural effusion with a trace left effusion now seen. Cardiomegaly and mild interstitial edema without marked interval change. Electronically Signed   By: Drusilla Kannerhomas  Dalessio M.D.   On: 11/14/2015 07:16   Dg Chest Port 1 View  11/13/2015  CLINICAL DATA:  Hypoxia and shortness of breath. EXAM: PORTABLE CHEST 1 VIEW COMPARISON:  Yesterday at 0215 hour FINDINGS: Cardiomegaly is unchanged. Progressive vascular congestion, mild pulmonary edema is unchanged. Suspect developing right pleural effusion. Bilateral lower lobe and right suprahilar linear atelectasis. No pneumothorax. IMPRESSION: Progressive vascular congestion and probable developing right pleural effusion. Mild pulmonary edema unchanged. Stable cardiomegaly. Electronically Signed   By: Rubye OaksMelanie  Ehinger M.D.   On: 11/13/2015 03:42   Dg Chest Port 1 View  11/12/2015  CLINICAL DATA:  Acute onset of cough and shortness of breath. Initial encounter. EXAM: PORTABLE CHEST 1 VIEW COMPARISON:  Chest radiograph performed 10/26/2015 FINDINGS: The lungs are well-aerated. Vascular congestion is noted, with increased interstitial markings, concerning for mild interstitial edema. Underlying  atelectasis is noted. There is no evidence of pleural effusion or pneumothorax. The cardiomediastinal silhouette is mildly enlarged. No acute osseous abnormalities are seen. IMPRESSION: Vascular congestion and mild cardiomegaly, with increased interstitial markings, concerning for mild interstitial  edema. Underlying atelectasis noted. Electronically Signed   By: Roanna Raider M.D.   On: 11/12/2015 02:44     CBC  Recent Labs Lab 11/12/15 0241 11/14/15 0412  WBC 8.4 10.9*  HGB 11.4* 11.1*  HCT 36.2 35.3*  PLT 270 249  MCV 94.5 93.1  MCH 29.8 29.3  MCHC 31.5 31.4  RDW 15.0 14.5  LYMPHSABS 1.1  --   MONOABS 1.1*  --   EOSABS 0.1  --   BASOSABS 0.0  --     Chemistries   Recent Labs Lab 11/12/15 0241 11/13/15 0406 11/14/15 0412 11/16/15 0237  NA 136 135 136 138  K 4.6 4.7 4.1 3.2*  CL 98* 99* 97* 94*  CO2 36*  GLUCOSE 100* 124* 137* 103*  BUN 33* 31* 28* 28*  CREATININE 2.60* 2.09* 1.89* 1.99*  CALCIUM 8.5* 8.6* 8.3* 8.8*  MG  --   --  1.7  --   AST 20  --   --   --   ALT 13*  --   --   --   ALKPHOS 43  --   --   --   BILITOT 0.8  --   --   --    ------------------------------------------------------------------------------------------------------------------ estimated creatinine clearance is 20.1 mL/min (by C-G formula based on Cr of 1.99). ------------------------------------------------------------------------------------------------------------------ No results for input(s): HGBA1C in the last 72 hours. ------------------------------------------------------------------------------------------------------------------ No results for input(s): CHOL, HDL, LDLCALC, TRIG, CHOLHDL, LDLDIRECT in the last 72 hours. ------------------------------------------------------------------------------------------------------------------ No results for input(s): TSH, T4TOTAL, T3FREE, THYROIDAB in the last 72 hours.  Invalid input(s):  FREET3 ------------------------------------------------------------------------------------------------------------------ No results for input(s): VITAMINB12, FOLATE, FERRITIN, TIBC, IRON, RETICCTPCT in the last 72 hours.  Coagulation profile  Recent Labs Lab 11/12/15 0719 11/13/15 0406 11/14/15 0412 11/15/15 0307 11/16/15 0237  INR 2.60* 2.25* 2.28* 2.29* 2.13*    No results for input(s): DDIMER in the last 72 hours.  Cardiac Enzymes No results for input(s): CKMB, TROPONINI, MYOGLOBIN in the last 168 hours.  Invalid input(s): CK ------------------------------------------------------------------------------------------------------------------ Invalid input(s): POCBNP   Time Spent in minutes  35   Monroe Qin K M.D on 11/16/2015 at 9:47 AM  Between 7am to 7pm - Pager - [phone]  After 7pm go to [email or web address] - password [facility]  [facility] -  Office  [phone]

## 2015-11-17 ENCOUNTER — Ambulatory Visit: Payer: Medicare Other | Admitting: Nurse Practitioner

## 2015-11-17 LAB — BASIC METABOLIC PANEL
Anion gap: 5 (ref 5–15)
BUN: 28 mg/dL — ABNORMAL HIGH (ref 6–20)
CO2: 38 mmol/L — ABNORMAL HIGH (ref 22–32)
Calcium: 9 mg/dL (ref 8.9–10.3)
Chloride: 95 mmol/L — ABNORMAL LOW (ref 101–111)
Creatinine, Ser: 1.96 mg/dL — ABNORMAL HIGH (ref 0.44–1.00)
GFR calc Af Amer: 27 mL/min — ABNORMAL LOW (ref >60)
GFR calc non Af Amer: 23 mL/min — ABNORMAL LOW (ref >60)
Glucose, Bld: 128 mg/dL — ABNORMAL HIGH (ref 65–99)
Potassium: 4 mmol/L (ref 3.5–5.1)
Sodium: 138 mmol/L (ref 135–145)

## 2015-11-17 LAB — CULTURE, BLOOD (ROUTINE X 2): Culture: NO GROWTH

## 2015-11-17 LAB — CBC
HCT: 36.2 % (ref 36.0–46.0)
Hemoglobin: 11.8 g/dL — ABNORMAL LOW (ref 12.0–15.0)
MCH: 30.3 pg (ref 26.0–34.0)
MCHC: 32.6 g/dL (ref 30.0–36.0)
MCV: 92.8 fL (ref 78.0–100.0)
Platelets: 242 10*3/uL (ref 150–400)
RBC: 3.9 MIL/uL (ref 3.87–5.11)
RDW: 14.7 % (ref 11.5–15.5)
WBC: 9.8 10*3/uL (ref 4.0–10.5)

## 2015-11-17 LAB — GLUCOSE, CAPILLARY
Glucose-Capillary: 114 mg/dL — ABNORMAL HIGH (ref 65–99)
Glucose-Capillary: 227 mg/dL — ABNORMAL HIGH (ref 65–99)

## 2015-11-17 LAB — MAGNESIUM: Magnesium: 2 mg/dL (ref 1.7–2.4)

## 2015-11-17 LAB — PROTIME-INR
INR: 2.07 — ABNORMAL HIGH (ref 0.00–1.49)
Prothrombin Time: 23.1 seconds — ABNORMAL HIGH (ref 11.6–15.2)

## 2015-11-17 MED ORDER — ISOSORBIDE MONONITRATE ER 30 MG PO TB24: 30.0000 mg | ORAL_TABLET | Freq: Every day | ORAL | Status: AC

## 2015-11-17 NOTE — Care Management Note (Signed)
Case Management Note  Patient Details  Name: [name] MRN: [id number] Date of Birth: [birth year]  Subjective/Objective:    Pt admitted with generalized weakness and dehydration; pt in A Fib with RVR                Action/Plan:  Pt is alone from home with home health RN, PT , Aide, pt stated she believed [facility] is [facility], CM contacted agency, agency to confirm, agency confirmed pt is active.  [facility]     Expected Discharge Date:                  Expected Discharge Plan:  Home w Home Health Services (Pt stays alone with close family (nephew and neices provide constant support and assistance when needed)  In-House Referral:     Discharge planning Services  CM Consult  Post Acute Care Choice:    Choice offered to:     DME Arranged:   3:1 [facility]  HH Arranged:   PT, RN, Aide, SW HH Agency:   [facility]  Status of Service:  Complete, will sign off  Medicare Important Message Given:  Yes Date Medicare IM Given:    Medicare IM give by:    Date Additional Medicare IM Given:    Additional Medicare Important Message give by:     If discussed at Long Length of Stay Meetings, dates discussed:    Additional Comments: 11/17/2015 Pt will discharge today to [name] (nephew) [facility]  CM contacted agency to inform of discharge today  11/14/15 Pt discharge location (nephews address) [address], [place] [id number]  Pt is currently refusing SNF placement as recommended.  SW, CM, PT, OT, nephew and [facility] have all attempted to discuss the safety concern and rationale for the SNF recommendation, however pt continues to refuse.  Pt will discharge home with her nephew [name] following discharge and will have 24 hour supervision, this discharge plan was verified with [name].  CM contacted [facility] to inform of pending discharge 11/15/15 and discharge address.   [name], Katie Ranes S, RN 11/17/2015, 9:45 AM

## 2015-11-17 NOTE — Progress Notes (Signed)
Physical Therapy Treatment Patient Details Name: [name] MRN: [id number] DOB: 09-27-1933 Today's Date: 11/17/2015    History of Present Illness Patient is an 81 y.o. female presents with generalized weakness and dehydration, found to be in A Fib with RVR. PMH includes COPD on 2L 02 at home, CAD s/p PCI x2, A-fib on warfarin, CKD stage IV, chronic diastolic CHF, and smoking.    PT Comments    Patient progressing well toward PT goals. Improved ambulation distance today with less DOE. Able to maintain Sp02 >94% on 2L/min 02 which is close to baseline. Continues to fatigue. Discussed safety and energy conservation techniques. Pt continues to refuse ST SNF so would benefit from maximizing [facility] services. Will follow acutely.   Follow Up Recommendations  SNF (pt refusing SNF)     Equipment Recommendations       Recommendations for Other Services       Precautions / Restrictions Precautions Precautions: Fall Precaution Comments: monitor 02 Restrictions Weight Bearing Restrictions: No    Mobility  Bed Mobility Overal bed mobility: Needs Assistance Bed Mobility: Supine to Sit     Supine to sit: Min assist;HOB elevated Sit to supine: Supervision   General bed mobility comments: Pt pulling up on therapist's arm to get to EOB.  Transfers Overall transfer level: Needs assistance Equipment used: Rolling walker (2 wheeled) Transfers: Sit to/from Stand Sit to Stand: Supervision         General transfer comment: Supervision for safety.   Ambulation/Gait Ambulation/Gait assistance: Min guard Ambulation Distance (Feet): 150 Feet Assistive device: Rolling walker (2 wheeled) Gait Pattern/deviations: Step-through pattern;Decreased stride length;Drifts right/left Gait velocity: decreased   General Gait Details: Slow, mildly unsteady gait with min guard for safety. DOE 2/4. Sp02 remained >94% on 2L/min 02.    Stairs            Wheelchair Mobility    Modified  Rankin (Stroke Patients Only)       Balance Overall balance assessment: Needs assistance Sitting-balance support: Feet supported;No upper extremity supported Sitting balance-Leahy Scale: Good     Standing balance support: During functional activity Standing balance-Leahy Scale: Poor Standing balance comment: Tolerated standing at sink to to brush teeth with 1 UE support. Stood for ~2 minutes.                     Cognition Arousal/Alertness: Awake/alert Behavior During Therapy: WFL for tasks assessed/performed Overall Cognitive Status: No family/caregiver present to determine baseline cognitive functioning Area of Impairment: Memory     Memory: Decreased short-term memory              Exercises      General Comments        Pertinent Vitals/Pain Pain Assessment: No/denies pain    Home Living                      Prior Function            PT Goals (current goals can now be found in the care plan section) Acute Rehab PT Goals Patient Stated Goal: not stated Progress towards PT goals: Progressing toward goals    Frequency  Min 3X/week    PT Plan Current plan remains appropriate    Co-evaluation             End of Session Equipment Utilized During Treatment: Gait belt;Oxygen Activity Tolerance: Patient tolerated treatment well Patient left: in bed;with call bell/phone within reach;with bed alarm set  Time: [id number] PT Time Calculation (min) (ACUTE ONLY): 22 min  Charges:  $Gait Training: 8-22 mins                    G Codes:      Fabiano Ginley A Yousaf Sainato 11/17/2015, 9:57 AM  Mylo RedShauna Jadzia Ibsen, PT, DPT [phone]

## 2015-11-17 NOTE — Progress Notes (Signed)
Occupational Therapy Treatment Patient Details Name: Katie FergusonDessie A Melendez MRN: [id number] DOB: 08/27/1933 Today's Date: 11/17/2015    History of present illness Patient is an 81 y.o. female presents with generalized weakness and dehydration, found to be in A Fib with RVR. PMH includes COPD on 2L 02 at home, CAD s/p PCI x2, A-fib on warfarin, CKD stage IV, chronic diastolic CHF, and smoking.   OT comments  Educated provided in session. Recommending HHOTand 24/7 supervision/assist if pt goes home-notified nurse of recommendation of HHOT.   Follow Up Recommendations  SNF;Supervision/Assistance - 24 hour    Equipment Recommendations  3 in 1 bedside comode    Recommendations for Other Services      Precautions / Restrictions Precautions Precautions: Fall Precaution Comments: monitor 02 Restrictions Weight Bearing Restrictions: No       Mobility Bed Mobility Overal bed mobility: Needs Assistance Bed Mobility: Supine to Sit;Sit to Supine     Supine to sit: Supervision Sit to supine: Supervision      Transfers Overall transfer level: Needs assistance     Sit to Stand: Supervision         General transfer comment: cues for hand placement    Balance    No LOB in session.                                ADL Overall ADL's : Needs assistance/impaired     Grooming: Wash/dry face;Applying deodorant;Sitting;Standing;Min guard Grooming Details (indicate cue type and reason): pt gathered items, otherwise supervision level Upper Body Bathing: Sitting;Min guard (washed underarms; pt gathered items, otherwise supervision)           Lower Body Dressing: Set up;Sitting/lateral leans (donned sock)   Toilet Transfer: Min guard;Ambulation;RW;BSC (supervision-sit to stand )           Functional mobility during ADLs: Min guard;Rolling walker General ADL Comments: Educated on safety such as safe footwear. Pt able to verbalize that she needs cell phone with her when  she is up. Gave pt energy conservation handout.       Vision                     Perception     Praxis      Cognition  Awake/Alert Behavior During Therapy: WFL for tasks assessed/performed Overall Cognitive Status: No family/caregiver present to determine baseline cognitive functioning Area of Impairment: Memory     Memory: Decreased short-term memory               Extremity/Trunk Assessment               Exercises     Shoulder Instructions       General Comments      Pertinent Vitals/ Pain       Pain Assessment: No/denies pain; O2 sats around 90-91% towards end of session on 2L of O2.  Home Living                                          Prior Functioning/Environment              Frequency Min 2X/week     Progress Toward Goals  OT Goals(current goals can now be found in the care plan section)  Progress towards OT goals: Progressing toward goals  Acute Rehab  OT Goals Patient Stated Goal: not stated OT Goal Formulation: With patient Time For Goal Achievement: 11/27/15 Potential to Achieve Goals: Good ADL Goals Pt Will Perform Grooming: with supervision;standing Pt Will Transfer to Toilet: with supervision;bedside commode;ambulating Additional ADL Goal #1: Pt will be supervision using RW/rollator for functional mobility Additional ADL Goal #2: Pt will be educated on energy conservation techniques/strategies and be able to verbalize at least 3 techniques   Plan Discharge plan remains appropriate    Co-evaluation                 End of Session Equipment Utilized During Treatment: Gait belt;Rolling walker;Oxygen   Activity Tolerance Patient tolerated treatment well   Patient Left in bed;with call bell/phone within reach;with bed alarm set   Nurse Communication Other (comment) (recommending HHOT)        Time: [phone] (PA came in and very briefly spoke to pt) OT Time Calculation (min): 16  min  Charges: OT General Charges $OT Visit: 1 Procedure OT Treatments $Self Care/Home Management : 8-22 mins  [name] OTR/L [phone] 11/17/2015, 9:25 AM

## 2015-11-17 NOTE — Discharge Summary (Signed)
[name], is a 81 y.o. female  DOB 1933-02-07  MRN [id number].  Admission date:  11/12/2015  Admitting Physician  Lorretta Harp, MD  Discharge Date:  11/17/2015   Primary MD  Bennie Pierini, [name]  Recommendations for primary care physician for things to follow:   Monitor INR, CBC, BMP, CXR in 2-3 days   Admission Diagnosis  Dehydration [E86.0] Peripheral edema [R60.9] Renal insufficiency [N28.9] Atrial fibrillation with rapid ventricular response (HCC) [I48.91] AKI (acute kidney injury) (HCC) [N17.9]   Discharge Diagnosis  Dehydration [E86.0] Peripheral edema [R60.9] Renal insufficiency [N28.9] Atrial fibrillation with rapid ventricular response (HCC) [I48.91] AKI (acute kidney injury) (HCC) [N17.9]     Principal Problem:   Generalized weakness Active Problems:   Essential hypertension, benign   CAD (coronary artery disease), native coronary artery   Hyperlipidemia with target LDL less than 100   Atrial fibrillation (HCC)   Gastroesophageal reflux disease without esophagitis   COPD (chronic obstructive pulmonary disease) (HCC)   Acute renal failure superimposed on stage 4 chronic kidney disease (HCC)   Chronic diastolic CHF (congestive heart failure) (HCC)   Diabetes mellitus without complication (HCC)   Cellulitis of right leg   Dehydration   Depression with anxiety   Chronic anticoagulation - Coumadin, CHADS2VASC=6   Acute on chronic diastolic CHF (congestive heart failure) (HCC)      Past Medical History  Diagnosis Date  . Hypertension   . CAD (coronary artery disease) 5/96  . Edema   . Hyperlipidemia   . Chronic atrial fibrillation (HCC)     Coumadin therapy   . Osteopenia   . Renal insufficiency   . Closed right ankle fracture   . CHF (congestive heart failure) (HCC)   . LBP  (low back pain)   . Arthritis   . Cataract   . COPD (chronic obstructive pulmonary disease) (HCC)     wears O2 at home as needed  . Esophageal stricture   . Esophageal dysmotility 09/2011    seen on barium esophagram.  . Carotid artery occlusion   . Myocardial infarction [facility])     Past Surgical History  Procedure Laterality Date  . Coronary angioplasty with stent placement  1993    RCA  . Mole removed  2013    umbilicus  . Esophagogastroduodenoscopy  2012, 05/2013,09/2013    dilation of esophagus and stricture in 09/2011, 05/2013.  meat disimpaction 09/2013  . Esophagogastroduodenoscopy N/A 10/18/2013    Procedure: ESOPHAGOGASTRODUODENOSCOPY (EGD);  Surgeon: Meryl Dare, MD;  Location: [facility] ENDOSCOPY;  Service: Endoscopy;  Laterality: N/A;  possible food impaction  . Cardiac catheterization  2006    LAD 40%, Diag 40%, CFX minimal dz, RCA w/ 40% ISR, AM2 70-80%, EF 55%        HPI  from the history and physical done on the day of admission:     [name] is a 81 y.o. female with PMH of hypertension, hyperlipidemia, diabetes mellitus, COPD on 2 L oxygen at home, GERD, depression, anxiety, CAD,  stent placement, atrial fibrillation on Coumadin, CKD-4, diastolic congestive heart failure, chronic lower back pain, esophageal stricture, who presents with a generalized weakness.  Patient reports that she does not feel right and has funny feeling since yesterday. Pt cannot elaborate and will not give any other details. Per EDP, pt has generalized weakness, lightheadedness, back pain, decreased appetite, and nonproductive cough. Pt denies chest pain, abdominal pain, diarrhea, dysuria, unilateral weakness. Chart review showed that pt was recently treated for R leg cellulitis and has duoderm applied to her open wound in R leg. She denies pain in leg. Of Note, recently had Torsemid dosing increased to 60 mg per day due to lower extremity swelling. That dose was cut back to 40 mg this  week.  In ED, patient was found to have INR 2.81, A2 troponin, likely due to 1.32, BNP 686.4, temperature normal, worsening renal function. Pending urinalysis. Chest x-ray showed mild interstitial edema. Patient submitted to inpatient for further evaluated the treatment.    Hospital Course:     1. Generalized weakness with dehydration and acute renal failure and chronic kidney disease stage III. Baseline creatinine close to 1.6. Much improved with initial hydration and now tolerating diuretics, creatinine trend is better peaked at 2.6. She is now on close to home dose diuretic. Request PCP to monitor BMP weight in diuretic dose closely.   2. Acute hypoxic respiratory failure due to Acute on chronic grade 3 diastolic dysfunction with EF around 55%. She is currently on combination of beta blocker and Cardizem for rate control as per her primary cardiologist which will be continued, she needed extra diuresis last few days per cardiology, now back to her home dose diuretic for the last 24 hours and stable. Cardiology following and cleared by them for discharge. Now symptom-free and back to her baseline. She is on 2 L nasal cannula oxygen at home which will be continued. She is symptom-free at this morning and eager to go home. She has refused SNF 3 and wants to go home, home health PT has been ordered. Continue home dose diuretic, other heart medications with addition of Imdur upon discharge with outpatient cardiology follow-up.   3. Essential hypertension. Continue Cardizem and metoprolol.   4. Underlying CAD. No acute issues. Continue statin, Coumadin, beta blocker for secondary prevention.   5. Chronic atrial fibrillation CHA2DS2-VASc Score is 6 - beta blocker and Cardizem for rate control, continue Coumadin. Pharmacy monitoring. Cardiology on board.   6. GERD. On PPI continue.   7. Dyslipidemia. Continued on home dose statin.   8. Mild productive cough. Likely due to underlying URI,  treated with Levaquin with resolution of symptoms. Chest x-ray shows no infiltrate.   9. History of COPD. Minimal wheezing likely due to pulmonary congestion is moving good amounts of air bilaterally. No wheezing or rales on exam today. On baseline 2 L nasal cannula oxygen continue along with her usual home medications.   10. 1 out of 2 blood cultures positive for cocoa bacilli which turned out to be diphtheroid species suggesting contamination. Discussed with ID physician Dr. Algis Liming over the phone. No further treatment. She is afebrile with no leukocytosis.   11. DM type II. Resume home regimen request PCP to monitor glycemic control.       Discharge Condition: Fair  Follow UP  Follow-up Information    Follow up with Bennie Pierini, [name]. Schedule an appointment as soon as possible for a visit in 2 days.   Specialty:  Family Medicine  Contact information:   [address] [place] [address] [phone]       Follow up with Donato Schultz, MD. Schedule an appointment as soon as possible for a visit in 1 week.   Specialty:  Cardiology   Why:  Afib   Contact information:   [address] [place] [address] [phone]        Consults obtained - Cards  Diet and Activity recommendation: See Discharge Instructions below  Discharge Instructions           Discharge Instructions    Discharge instructions    Complete by:  As directed   Follow with Primary MD Bennie Pierini, [name] in 2-3 days   Get CBC, CMP, INR, 2 view Chest X ray checked  by Primary MD next visit.    Activity: As tolerated with Full fall precautions use walker/cane & assistance as needed   Disposition Home    Diet: Heart Healthy - Low carb.  Check your Weight same time everyday, if you gain over 2 pounds, or you develop in leg swelling, experience more shortness of breath or chest pain, call your Primary MD immediately. Follow Cardiac Low Salt Diet and 1.5  lit/day fluid restriction.   On your next visit with your primary care physician please Get Medicines reviewed and adjusted.   Please request your Prim.MD to go over all Hospital Tests and Procedure/Radiological results at the follow up, please get all Hospital records sent to your Prim MD by signing hospital release before you go home.   If you experience worsening of your admission symptoms, develop shortness of breath, life threatening emergency, suicidal or homicidal thoughts you must seek medical attention immediately by calling 911 or calling your MD immediately  if symptoms less severe.  You Must read complete instructions/literature along with all the possible adverse reactions/side effects for all the Medicines you take and that have been prescribed to you. Take any new Medicines after you have completely understood and accpet all the possible adverse reactions/side effects.   Do not drive, operating heavy machinery, perform activities at heights, swimming or participation in water activities or provide baby sitting services if your were admitted for syncope or siezures until you have seen by Primary MD or a Neurologist and advised to do so again.  Do not drive when taking Pain medications.    Do not take more than prescribed Pain, Sleep and Anxiety Medications  Special Instructions: If you have smoked or chewed Tobacco  in the last 2 yrs please stop smoking, stop any regular Alcohol  and or any Recreational drug use.  Wear Seat belts while driving.   Please note  You were cared for by a hospitalist during your hospital stay. If you have any questions about your discharge medications or the care you received while you were in the hospital after you are discharged, you can call the unit and asked to speak with the hospitalist on call if the hospitalist that took care of you is not available. Once you are discharged, your primary care physician will handle any further medical  issues. Please note that NO REFILLS for any discharge medications will be authorized once you are discharged, as it is imperative that you return to your primary care physician (or establish a relationship with a primary care physician if you do not have one) for your aftercare needs so that they can reassess your need for medications and monitor your lab values.  Increase activity slowly    Complete by:  As directed              Discharge Medications       Medication List    TAKE these medications        alendronate 70 MG tablet  Commonly known as:  FOSAMAX  Take 1 tablet (70 mg total) by mouth once a week. Take with a full glass of water on an empty stomach.     atorvastatin 40 MG tablet  Commonly known as:  LIPITOR  TAKE 1 TABLET (40 MG TOTAL) BY MOUTH DAILY.     busPIRone 7.5 MG tablet  Commonly known as:  BUSPAR  Take 1 tablet (7.5 mg total) by mouth 2 (two) times daily. .     citalopram 20 MG tablet  Commonly known as:  CELEXA  Take 1 tablet (20 mg total) by mouth daily.     dexlansoprazole 60 MG capsule  Commonly known as:  DEXILANT  TAKE 1 CAPSULE (60 MG TOTAL) BY MOUTH DAILY.     diltiazem 360 MG 24 hr capsule  Commonly known as:  CARDIZEM CD  Take 1 capsule (360 mg total) by mouth daily.     fluticasone 50 MCG/ACT nasal spray  Commonly known as:  FLONASE  Place 2 sprays into both nostrils daily.     Fluticasone-Salmeterol 250-50 MCG/DOSE Aepb  Commonly known as:  ADVAIR DISKUS  Inhale 1 puff into the lungs 2 (two) times daily.     glucose blood test strip  Commonly known as:  ONETOUCH VERIO  Test 1x per day and prn   Dx- e11.9     HYDROcodone-acetaminophen 5-325 MG tablet  Commonly known as:  NORCO/VICODIN  Take 1 tablet by mouth 2 (two) times daily as needed for moderate pain.     isosorbide mononitrate 30 MG 24 hr tablet  Commonly known as:  IMDUR  Take 1 tablet (30 mg total) by mouth daily.     loratadine 10 MG tablet  Commonly known as:   CLARITIN  Take 1 tablet (10 mg total) by mouth daily.     metFORMIN 500 MG (MOD) 24 hr tablet  Commonly known as:  GLUMETZA  Take 500 mg by mouth daily with breakfast.     metoprolol 100 MG tablet  Commonly known as:  LOPRESSOR  Take 1 tablet (100 mg total) by mouth 2 (two) times daily.     nitroGLYCERIN 0.4 MG SL tablet  Commonly known as:  NITROSTAT  Place 1 tablet (0.4 mg total) under the tongue every 5 (five) minutes as needed for chest pain.     onetouch ultrasoft lancets  Use as instructed     potassium chloride SA 20 MEQ tablet  Commonly known as:  K-DUR,KLOR-CON  Take 20 mEq by mouth daily.     tiotropium 18 MCG inhalation capsule  Commonly known as:  SPIRIVA HANDIHALER  PLACE 1 CAPSULE (18 MCG TOTAL) INTO INHALER AND INHALE DAILY.     torsemide 20 MG tablet  Commonly known as:  DEMADEX  Take 40 mg by mouth daily.     warfarin 5 MG tablet  Commonly known as:  COUMADIN  Take 0.5-1 tablets (2.5-5 mg total) by mouth See admin instructions. Takes 1 tablet (5mg ) every Tuesdays and Fridays and 1/2 tablet (2.5 mg) all other days.        Major procedures and Radiology Reports - PLEASE review detailed and final reports for all details, in brief -  Dg Chest 2 View  10/26/2015  CLINICAL DATA:  right leg edema.  Fluid leaking from foot for 1 day EXAM: CHEST  2 VIEW COMPARISON:  10/09/2015 FINDINGS: There is moderate cardiac enlargement. Aortic atherosclerosis noted. Atelectasis is noted in the right base. There is a small right pleural effusion identified. No interstitial edema. Multi level degenerative disc disease identified within the thoracic spine. IMPRESSION: 1. Cardiac enlargement, aortic atherosclerosis and small right pleural effusion. 2. Right upper lobe atelectasis. Electronically Signed   By: Signa Kell M.D.   On: 10/26/2015 14:49   US Renal  11/12/2015  CLINICAL DATA:  Acute kidney injury. EXAM: RENAL / URINARY TRACT ULTRASOUND COMPLETE COMPARISON:   Renal ultrasound 04/23/2010 FINDINGS: Right Kidney: Length: 9.5 cm. There is thinning of the renal parenchyma and increased renal echogenicity. An echogenic structure in the interpolar region measuring 1.0 x 0.6 x 0.8 cm is likely a nonobstructing stone, and appears similar to prior renal ultrasound. No mass or hydronephrosis visualized. Left Kidney: Length: 10.4 cm. There is thinning of the renal parenchyma and increased echogenicity. There is an echogenic 1.2 x 0.9 x 1.4 cm in shadowing structure in the interpolar region. No mass or hydronephrosis visualized. Bladder: Appears normal for degree of bladder distention. IMPRESSION: 1. Bilateral nonobstructing renal calculi. 2. Thinning of the renal parenchyma and increased renal echogenicity consistent with chronic medical renal disease. 3. No hydronephrosis. Electronically Signed   By: Rubye Oaks M.D.   On: 11/12/2015 05:41   Dg Chest Port 1 View  11/14/2015  CLINICAL DATA:  Shortness of breath.  History of COPD. EXAM: PORTABLE CHEST 1 VIEW COMPARISON:  Single view of the chest 11/13/2015 and 11/12/2015. CT chest 09/20/2013. FINDINGS: Cardiomegaly is again seen. Small bilateral pleural effusions are present, greater on the right. There is mild interstitial edema. Right mid lung zone and right basilar atelectasis noted. IMPRESSION: Some increase in a small right pleural effusion with a trace left effusion now seen. Cardiomegaly and mild interstitial edema without marked interval change. Electronically Signed   By: Drusilla Kanner M.D.   On: 11/14/2015 07:16   Dg Chest Port 1 View  11/13/2015  CLINICAL DATA:  Hypoxia and shortness of breath. EXAM: PORTABLE CHEST 1 VIEW COMPARISON:  Yesterday at 0215 hour FINDINGS: Cardiomegaly is unchanged. Progressive vascular congestion, mild pulmonary edema is unchanged. Suspect developing right pleural effusion. Bilateral lower lobe and right suprahilar linear atelectasis. No pneumothorax. IMPRESSION: Progressive  vascular congestion and probable developing right pleural effusion. Mild pulmonary edema unchanged. Stable cardiomegaly. Electronically Signed   By: Rubye Oaks M.D.   On: 11/13/2015 03:42   Dg Chest Port 1 View  11/12/2015  CLINICAL DATA:  Acute onset of cough and shortness of breath. Initial encounter. EXAM: PORTABLE CHEST 1 VIEW COMPARISON:  Chest radiograph performed 10/26/2015 FINDINGS: The lungs are well-aerated. Vascular congestion is noted, with increased interstitial markings, concerning for mild interstitial edema. Underlying atelectasis is noted. There is no evidence of pleural effusion or pneumothorax. The cardiomediastinal silhouette is mildly enlarged. No acute osseous abnormalities are seen. IMPRESSION: Vascular congestion and mild cardiomegaly, with increased interstitial markings, concerning for mild interstitial edema. Underlying atelectasis noted. Electronically Signed   By: Roanna Raider M.D.   On: 11/12/2015 02:44    Micro Results      Recent Results (from the past 240 hour(s))  Urine culture     Status: None   Collection Time: 11/12/15  1:24 AM  Result Value Ref Range Status   Specimen Description  URINE, CLEAN CATCH  Final   Special Requests NONE  Final   Culture >=100,000 COLONIES/mL YEAST  Final   Report Status 11/14/2015 FINAL  Final  Culture, blood (routine x 2)     Status: None (Preliminary result)   Collection Time: 11/12/15  2:39 AM  Result Value Ref Range Status   Specimen Description BLOOD RIGHT ANTECUBITAL  Final   Special Requests BOTTLES DRAWN AEROBIC AND ANAEROBIC 5CC   Final   Culture NO GROWTH 4 DAYS  Final   Report Status PENDING  Incomplete  Culture, blood (routine x 2)     Status: None   Collection Time: 11/12/15  2:41 AM  Result Value Ref Range Status   Specimen Description BLOOD RIGHT WRIST  Final   Special Requests BOTTLES DRAWN AEROBIC AND ANAEROBIC 10CC   Final   Culture  Setup Time   Final    GRAM POSITIVE COCCOBACILLUS AEROBIC  BOTTLE ONLY CRITICAL RESULT CALLED TO, READ BACK BY AND VERIFIED WITH: MEGAN @0713  11/15/15 MKELLY    Culture   Final    DIPHTHEROIDS(CORYNEBACTERIUM SPECIES) Standardized susceptibility testing for this organism is not available.    Report Status 11/16/2015 FINAL  Final    Today   Subjective    [name] today has no headache,no chest abdominal pain,no new weakness tingling or numbness, feels much better wants to go home today.    Objective   Blood pressure 133/61, pulse 93, temperature 97.8 F (36.6 C), temperature source Oral, resp. rate 18, height 5' (1.524 m), weight 72.938 kg (160 lb 12.8 oz), last menstrual period 03/27/1983, SpO2 95 %.   Intake/Output Summary (Last 24 hours) at 11/17/15 0840 Last data filed at 11/17/15 0355  Gross per 24 hour  Intake      0 ml  Output   2500 ml  Net  -2500 ml    Exam Awake Alert, Oriented x 3, No new F.N deficits, Normal affect [place].AT,PERRAL Supple Neck,No JVD, No cervical lymphadenopathy appriciated.  Symmetrical Chest wall movement, Good air movement bilaterally, no rales iRRR,No Gallops,Rubs or new Murmurs, No Parasternal Heave +ve B.Sounds, Abd Soft, Non tender, No organomegaly appriciated, No rebound -guarding or rigidity. No Cyanosis, Clubbing or edema, No new Rash or bruise   Data Review   CBC w Diff:  Lab Results  Component Value Date   WBC 9.8 11/17/2015   WBC 14.6* 09/24/2015   WBC 10.8* 06/14/2014   HGB 11.8* 11/17/2015   HGB 15.3 07/08/2014   HCT 36.2 11/17/2015   HCT 51.0* 09/24/2015   HCT 51.2* 06/14/2014   PLT 242 11/17/2015   LYMPHOPCT 14 11/12/2015   MONOPCT 13 11/12/2015   EOSPCT 1 11/12/2015   BASOPCT 0 11/12/2015    CMP:  Lab Results  Component Value Date   NA 138 11/17/2015   NA 137 10/16/2015   K 4.0 11/17/2015   CL 95* 11/17/2015   CO2 38* 11/17/2015   BUN 28* 11/17/2015   BUN 34* 10/16/2015   CREATININE 1.96* 11/17/2015   CREATININE 1.13* 06/25/2013   GLU 104 01/18/2013   PROT  4.9* 11/12/2015   PROT 6.3 09/24/2015   ALBUMIN 2.7* 11/12/2015   ALBUMIN 3.9 09/24/2015   BILITOT 0.8 11/12/2015   BILITOT 0.3 09/24/2015   ALKPHOS 43 11/12/2015   AST 20 11/12/2015   ALT 13* 11/12/2015  . Lab Results  Component Value Date   INR 2.07* 11/17/2015   INR 2.13* 11/16/2015   INR 2.29* 11/15/2015   Lab Results  Component Value Date   HGBA1C 6.4* 09/17/2015    CBG (last 3)   Recent Labs  11/16/15 1619 11/16/15 2138 11/17/15 0631  GLUCAP 80 171* 114*      Total Time in preparing paper work, data evaluation and todays exam - 35 minutes  Leroy Sea M.D on 11/17/2015 at 8:40 AM  [facility]   Office  [phone]

## 2015-11-17 NOTE — Progress Notes (Signed)
ANTICOAGULATION CONSULT NOTE - Follow Up Consult  Pharmacy Consult for Coumadin Indication: atrial fibrillation  No Known Allergies  Patient Measurements: Height: 5' (152.4 cm) Weight: 160 lb 12.8 oz (72.938 kg) IBW/kg (Calculated) : 45.5  Vital Signs: Temp: 97.5 F (36.4 C) (11/21 0918) Temp Source: Oral (11/21 0918) BP: 96/66 mmHg (11/21 0918) Pulse Rate: 99 (11/21 0918)  Labs:  Recent Labs  11/15/15 0307 11/16/15 0237 11/17/15 0512  HGB  --   --  11.8*  HCT  --   --  36.2  PLT  --   --  242  LABPROT 25.0* 23.6* 23.1*  INR 2.29* 2.13* 2.07*  CREATININE  --  1.99* 1.96*    Estimated Creatinine Clearance: 20.4 mL/min (by C-G formula based on Cr of 1.96).  Assessment: 82yof continues on her usual coumadin dosage as PTA for afib. INR= 2.07, remains therapeutic. CBC is stable. No bleeding reported. Day #5 levaquin which can potentiate INR, but no drug interaction seen yet and levaquin not to be continued at discharge.  Plans per MD to discharge home today on home dose of coumadin  2.5mg  daily except 5mg  T/Th/Sat.   Goal of Therapy:  INR 2-3 Monitor platelets by anticoagulation protocol: Yes   Plan:  1) MD is discharging home today. To continue her PTA dosage of coumadin 2.5mg  daily except 5mg  T/Th/Sat  Noah Delaineuth Timea Breed, RPh Clinical Pharmacist Pager: [phone]  11/17/2015,11:25 AM

## 2015-11-17 NOTE — Discharge Instructions (Signed)
Follow with Primary MD Bennie Pierini, [name] in 2-3 days   Get CBC, CMP, INR , 2 view Chest X ray checked  by Primary MD next visit.    Activity: As tolerated with Full fall precautions use walker/cane & assistance as needed   Disposition Home    Diet: Heart Healthy - Low carb.  Check your Weight same time everyday, if you gain over 2 pounds, or you develop in leg swelling, experience more shortness of breath or chest pain, call your Primary MD immediately. Follow Cardiac Low Salt Diet and 1.5 lit/day fluid restriction.   On your next visit with your primary care physician please Get Medicines reviewed and adjusted.   Please request your Prim.MD to go over all Hospital Tests and Procedure/Radiological results at the follow up, please get all Hospital records sent to your Prim MD by signing hospital release before you go home.   If you experience worsening of your admission symptoms, develop shortness of breath, life threatening emergency, suicidal or homicidal thoughts you must seek medical attention immediately by calling 911 or calling your MD immediately  if symptoms less severe.  You Must read complete instructions/literature along with all the possible adverse reactions/side effects for all the Medicines you take and that have been prescribed to you. Take any new Medicines after you have completely understood and accpet all the possible adverse reactions/side effects.   Do not drive, operating heavy machinery, perform activities at heights, swimming or participation in water activities or provide baby sitting services if your were admitted for syncope or siezures until you have seen by Primary MD or a Neurologist and advised to do so again.  Do not drive when taking Pain medications.    Do not take more than prescribed Pain, Sleep and Anxiety Medications  Special Instructions: If you have smoked or chewed Tobacco  in the last 2 yrs please stop smoking, stop any regular Alcohol   and or any Recreational drug use.  Wear Seat belts while driving.   Please note  You were cared for by a hospitalist during your hospital stay. If you have any questions about your discharge medications or the care you received while you were in the hospital after you are discharged, you can call the unit and asked to speak with the hospitalist on call if the hospitalist that took care of you is not available. Once you are discharged, your primary care physician will handle any further medical issues. Please note that NO REFILLS for any discharge medications will be authorized once you are discharged, as it is imperative that you return to your primary care physician (or establish a relationship with a primary care physician if you do not have one) for your aftercare needs so that they can reassess your need for medications and monitor your lab values.  Information on my medicine - Coumadin   (Warfarin)- you were taking this medication prior to this hospitalization.  Why was Coumadin prescribed for you?   Coumadin was prescribed for you because you have a blood clot or a medical condition that can cause an increased risk of forming blood clots. Blood clots can cause serious health problems by blocking the flow of blood to the heart, lung, or brain. Coumadin can prevent harmful blood clots from forming. As a reminder your indication for Coumadin is:   Stroke Prevention Because Of Atrial Fibrillation  What test will check on my response to Coumadin? While on Coumadin (warfarin) you will need to have an  INR test regularly to ensure that your dose is keeping you in the desired range. The INR (international normalized ratio) number is calculated from the result of the laboratory test called prothrombin time (PT).  If an INR APPOINTMENT HAS NOT ALREADY BEEN MADE FOR YOU please schedule an appointment to have this lab work done by your health care provider within 7 days. Your INR goal is usually a  number between:  2 to 3 or your provider may give you a more narrow range like 2-2.5.  Ask your health care provider during an office visit what your goal INR is.  What  do you need to  know  About  COUMADIN? Take Coumadin (warfarin) exactly as prescribed by your healthcare provider about the same time each day.  DO NOT stop taking without talking to the doctor who prescribed the medication.  Stopping without other blood clot prevention medication to take the place of Coumadin may increase your risk of developing a new clot or stroke.  Get refills before you run out.  What do you do if you miss a dose? If you miss a dose, take it as soon as you remember on the same day then continue your regularly scheduled regimen the next day.  Do not take two doses of Coumadin at the same time.  Important Safety Information A possible side effect of Coumadin (Warfarin) is an increased risk of bleeding. You should call your healthcare provider right away if you experience any of the following: ? Bleeding from an injury or your nose that does not stop. ? Unusual colored urine (red or dark brown) or unusual colored stools (red or black). ? Unusual bruising for unknown reasons. ? A serious fall or if you hit your head (even if there is no bleeding).  Some foods or medicines interact with Coumadin (warfarin) and might alter your response to warfarin. To help avoid this: ? Eat a balanced diet, maintaining a consistent amount of Vitamin K. ? Notify your provider about major diet changes you plan to make. ? Avoid alcohol or limit your intake to 1 drink for women and 2 drinks for men per day. (1 drink is 5 oz. wine, 12 oz. beer, or 1.5 oz. liquor.)  Make sure that ANY health care provider who prescribes medication for you knows that you are taking Coumadin (warfarin).  Also make sure the healthcare provider who is monitoring your Coumadin knows when you have started a new medication including herbals and  non-prescription products.  Coumadin (Warfarin)  Major Drug Interactions  Increased Warfarin Effect Decreased Warfarin Effect  Alcohol (large quantities) Antibiotics (esp. Septra/Bactrim, Flagyl, Cipro) Amiodarone (Cordarone) Aspirin (ASA) Cimetidine (Tagamet) Megestrol (Megace) NSAIDs (ibuprofen, naproxen, etc.) Piroxicam (Feldene) Propafenone (Rythmol SR) Propranolol (Inderal) Isoniazid (INH) Posaconazole (Noxafil) Barbiturates (Phenobarbital) Carbamazepine (Tegretol) Chlordiazepoxide (Librium) Cholestyramine (Questran) Griseofulvin Oral Contraceptives Rifampin Sucralfate (Carafate) Vitamin K   Coumadin (Warfarin) Major Herbal Interactions  Increased Warfarin Effect Decreased Warfarin Effect  Garlic Ginseng Ginkgo biloba Coenzyme Q10 Green tea St. Johns wort    Coumadin (Warfarin) FOOD Interactions  Eat a consistent number of servings per week of foods HIGH in Vitamin K (1 serving =  cup)  Collards (cooked, or boiled & drained) Kale (cooked, or boiled & drained) Mustard greens (cooked, or boiled & drained) Parsley *serving size only =  cup Spinach (cooked, or boiled & drained) Swiss chard (cooked, or boiled & drained) Turnip greens (cooked, or boiled & drained)  Eat a consistent number of servings per week  of foods MEDIUM-HIGH in Vitamin K (1 serving = 1 cup)  Asparagus (cooked, or boiled & drained) Broccoli (cooked, boiled & drained, or raw & chopped) Brussel sprouts (cooked, or boiled & drained) *serving size only =  cup Lettuce, raw (green leaf, endive, romaine) Spinach, raw Turnip greens, raw & chopped   These websites have more information on Coumadin (warfarin):  http://www.king-russell.com/; https://www.hines.net/;

## 2015-11-18 ENCOUNTER — Encounter: Payer: Self-pay | Admitting: *Deleted

## 2015-11-18 ENCOUNTER — Other Ambulatory Visit: Payer: Self-pay | Admitting: Nurse Practitioner

## 2015-11-18 ENCOUNTER — Other Ambulatory Visit: Payer: Self-pay | Admitting: Licensed Clinical Social Worker

## 2015-11-18 ENCOUNTER — Other Ambulatory Visit: Payer: Self-pay | Admitting: *Deleted

## 2015-11-18 DIAGNOSIS — I2510 Atherosclerotic heart disease of native coronary artery without angina pectoris: Secondary | ICD-10-CM | POA: Diagnosis not present

## 2015-11-18 DIAGNOSIS — E785 Hyperlipidemia, unspecified: Secondary | ICD-10-CM | POA: Diagnosis not present

## 2015-11-18 DIAGNOSIS — I130 Hypertensive heart and chronic kidney disease with heart failure and stage 1 through stage 4 chronic kidney disease, or unspecified chronic kidney disease: Secondary | ICD-10-CM | POA: Diagnosis not present

## 2015-11-18 DIAGNOSIS — M1990 Unspecified osteoarthritis, unspecified site: Secondary | ICD-10-CM | POA: Diagnosis not present

## 2015-11-18 DIAGNOSIS — N189 Chronic kidney disease, unspecified: Secondary | ICD-10-CM | POA: Diagnosis not present

## 2015-11-18 DIAGNOSIS — Z9981 Dependence on supplemental oxygen: Secondary | ICD-10-CM | POA: Diagnosis not present

## 2015-11-18 DIAGNOSIS — Z9181 History of falling: Secondary | ICD-10-CM | POA: Diagnosis not present

## 2015-11-18 DIAGNOSIS — J449 Chronic obstructive pulmonary disease, unspecified: Secondary | ICD-10-CM | POA: Diagnosis not present

## 2015-11-18 DIAGNOSIS — I482 Chronic atrial fibrillation: Secondary | ICD-10-CM | POA: Diagnosis not present

## 2015-11-18 DIAGNOSIS — I5032 Chronic diastolic (congestive) heart failure: Secondary | ICD-10-CM | POA: Diagnosis not present

## 2015-11-18 MED ORDER — LORATADINE 10 MG PO TABS: 10.0000 mg | ORAL_TABLET | Freq: Every day | ORAL | Status: AC

## 2015-11-18 NOTE — Patient Outreach (Signed)
Assessment:  CSW called [facility] on 11/18/15 and spoke with [facility] representative Katie Hews(Shane) regarding Personal Care Service application status for client. [name] reported to CSW that [facility] had received Personal Care Services application for client and that client just needed to call [facility] to set up home nursing assessment visit for client with [facility] RN.  [name] said that client and [name], niece, could call [facility] at [phone] to speak with [facility] representative about setting up home nursing assessment for client with [facility] RN.  CSW thanked [place] with [facility] for above information.  Plan: CSW to call client/[name] to give client/Katie [facility] contact number [phone]) and to encourage client/Katie to call [facility] to set up RN nursing assessment home visit for client with [facility] RN. CSW to inform RN [name] of above information.  [name] MSW, LCSW Licensed Clinical Social Worker [facility] Care Management [phone]

## 2015-11-18 NOTE — Patient Outreach (Signed)
Assessment: CSW received call on 11/18/15 from Carver FilaKimberly Melendez of [facility].  Carver FilaKimberly Melendez identified herself to CSW.  [name] and CSW spoke of client needs. [name] said that she was going to visit client on 11/18/15 at home of client.  CSW informed [name] that client has some support from niece [name]; however, [name] feels that she has difficulty now caring for ongoing client needs. Client also has some support from her nephew, [name].  CSW informed [name] that client was recommended to have 24 hours around the clock care on hospital discharge; however, client would not agree to go to care facility, wanting instead to return to her home upon hospital discharge.  [name] has phone number of CSW ([phone]) to contact CSW as needed to further discuss client needs.  Plan: Carver FilaKimberly Melendez of [facility] to conduct home visit with client on 11/18/15 to assess current needs of client. CSW to inform RN Irving ShowsJulie Melendez of above information.  Kelton PillarMichael Melendez.Anneta Rounds MSW, LCSW Licensed Clinical Social Worker [facility] Care Management [phone]

## 2015-11-18 NOTE — Patient Outreach (Signed)
Assessment:  CSW called client on 11/18/15 and spoke via phone with client on 11/18/15. .CSW verified identity of client. [name] said she was [facility]. She said she is staying at her home and receiving assistance and care support as needed from her nephew, [name], and from [place], her niece.  She said she has her prescribed medications and is taking medications as prescribed.  She said she has adequate food supply and is sleeping adequately.  CSW and client spoke of client care plan goal. CSW encouraged client to cooperate with scheduled in home care providers helping client in home environment for next 30 days. Client also said she is still hoping to receive some roof repair support from community agency recently contacted by her nephew, [name].  CSW encouraged client to call CSW at [phone] as needed to discuss social work needs of client.  Plan: Client to cooperate with care providers assisting client in the home environment as scheduled for next 30 days. CSW to call client in 4 weeks to assess needs of client at that time.  Kelton PillarMichael S.Eadie Repetto MSW, LCSW Licensed Clinical Social Worker [facility] Care Management [phone]

## 2015-11-18 NOTE — Patient Outreach (Addendum)
11/18/15- Pt hospitalized 11/16-11/21/16- telephone call for transition of care week 1, spoke with caregiver Ricki Rodriguez (she lives behind pt on the same land).  Marchelle Folks states "I'm stressed and exhausted, we can't do this"  Pt is not living with Sharl Ma and Marchelle Folks as planned as they do not have a wheelchair ramp and pt wants to be in her own home.  Marchelle Folks states "she already fell last night, it's already started" , denies any injury. Home health is seeing pt Genevieve Norlander). Marchelle Folks has not been checking CBG and says home health will check it citing " I'm too worn out to check anything and I haven't been helping her weigh either, it's the last thing on my mind"  Marchelle Folks says pt cannot operate the tablet and scales by herself and says pt cannot do "much of anything by herself".  Pt is on CAP list for [facility] and pt did sign papers for this last night after much coaxing.  Pt has no water at her home and there is no money to fix the water, the roof is still in terrible condition and leaks.  Pt was deemed competent at hospital and was therefore allowed to come back to her own home as this is her choice, she refuses any other level of care.  [name] requests prescription for claritin as this is on discharge summary, pt is to take and says " this really helps her cough and other symptoms"  Pt prefers not to purchase over the counter due to expense.  RN CM sent In Basket to Paulene Floor NP asking for prescription to [facility] [place], also faxed letter to Paulene Floor NP informing pt in transition of care program. RN CM ask Marchelle Folks to take all medication bottles to MD appointment on Friday 11/21/15. RN CM called [facility] assistant director Livia Snellen and reported the above mentioned concerns and agreed upon that RN CM will contact Adult Protective Services and update [facility] CSW for issues related to CAP and PCS services,level of care issues, running water and roofing issues. RN CM called Adult Prote[facility] Services in  [place], spokes with Lupita Leash, report given. RN CM sent In Basket to [facility] CSW Lorna Few with report and update that Adult Protective Services was contacted and pt continues to have ongoing financial issues, housing issues, no running water and ask that CSW continue following up with PCS and CAP.   [facility] CM Care Plan Problem One        Most Recent Value   Care Plan Problem One      Role Documenting the Problem One  Clinical Social Worker   Care Plan for Problem One  Active   [facility] Long Term Goal (31-90 days)  pt will have no readmissions related to CHF within 90 days.   [facility]N Long Term Goal Start Date  10/23/15   [facility] CM Short Term Goal #1 (0-30 days)      [facility] CM Short Term Goal #1 Start Date  11/18/15   Interventions for Short Term Goal #1       [facility] CM Care Plan Problem Two        Most Recent Value   Care Plan Problem Two  knowledge deficit related to COPD/ pneumonia   Role Documenting the Problem Two  Care Management Coordinator   Care Plan for Problem Two  Active   Interventions for Problem Two Long Term Goal   RN CM reviewed COPD action plan with caregiver Marchelle Folks  THN Long Term Goal (31-90) days  pt will have no readmissions related to COPD/ pneumonia within 90 days   THN Long Term Goal Start Date  10/17/15   THN CM Short Term Goal #1 (0-30 days)  pt will verbalize COPD action plan/ zones within 30 days.   Interventions for Short Term Goal #2   RN CM reinforced importance of calling early for change in health status, reviewed action plan      PLAN Follow up with home visit next week 11/25/15  [name] [facility], BSN [facility] Community Care Coordinator [phone]

## 2015-11-19 ENCOUNTER — Telehealth: Payer: Self-pay | Admitting: *Deleted

## 2015-11-19 ENCOUNTER — Telehealth: Payer: Self-pay

## 2015-11-19 NOTE — Telephone Encounter (Signed)
Patient discharged from hospital and did not go into Rehab like they suggested but [name] talked to her yesterday and the patient has now [facility]  Everything in place to do that per [name].   Patient has had one fall since being home

## 2015-11-19 NOTE — Telephone Encounter (Signed)
Appt was scheduled prior to discharge.  1st attempt at contact there was no answer and no voicemail to leave a message. Will try again later today.  2nd attempt at 10:00 was unsuccessful. No answer and no voicemail. Will try again this afternoon.   Date of Discharge:11/17/15  [facility]   Principal Discharge Diagnosis: Dehydration  Reason for Chronic Case Management: CHF, DM, CKD, COPD  Call Completed and Appointment Scheduled: Yes, Date: 11/21/15 with Bennie PieriniMary Margaret Martin, [name]   Patient is knowledgeable of his/her condition(s) and/or treatment: Yes  Family and/or caregiver is knowledgeable of patient's condition (s) and/or treatment: yes  Patient is caring for self at home: Yes  Patient is receiving home health services: yes Patient has had home health services twice weekly for a couple of months   MEDICATION RECONCILIATION Medication list reviewed with patient: Yes  Patient is able to obtain needed medications: Yes  ACTIVITIES OF DAILY LIVING  Is the patient able to perform his/her own ADLs: No-assisted by family and home health   PATIENT EDUCATION  Questions/Concerns Discussed: Has had several falls since being discharged from hospital. Complains of coccyx pain. Family would like an xray to r/o fracture. Family is concerned about her ability to stay at home without round the clock help.

## 2015-11-21 ENCOUNTER — Encounter: Payer: Self-pay | Admitting: Nurse Practitioner

## 2015-11-21 ENCOUNTER — Ambulatory Visit (INDEPENDENT_AMBULATORY_CARE_PROVIDER_SITE_OTHER): Payer: Medicare Other | Admitting: Nurse Practitioner

## 2015-11-21 VITALS — BP 81/52 | HR 80 | Temp 96.8°F | Ht 60.0 in | Wt 165.0 lb

## 2015-11-21 DIAGNOSIS — M1990 Unspecified osteoarthritis, unspecified site: Secondary | ICD-10-CM | POA: Diagnosis not present

## 2015-11-21 DIAGNOSIS — E119 Type 2 diabetes mellitus without complications: Secondary | ICD-10-CM | POA: Diagnosis not present

## 2015-11-21 DIAGNOSIS — I252 Old myocardial infarction: Secondary | ICD-10-CM | POA: Diagnosis not present

## 2015-11-21 DIAGNOSIS — S32301D Unspecified fracture of right ilium, subsequent encounter for fracture with routine healing: Secondary | ICD-10-CM | POA: Diagnosis not present

## 2015-11-21 DIAGNOSIS — I6529 Occlusion and stenosis of unspecified carotid artery: Secondary | ICD-10-CM | POA: Diagnosis not present

## 2015-11-21 DIAGNOSIS — E860 Dehydration: Secondary | ICD-10-CM | POA: Diagnosis not present

## 2015-11-21 DIAGNOSIS — L03116 Cellulitis of left lower limb: Secondary | ICD-10-CM | POA: Diagnosis not present

## 2015-11-21 DIAGNOSIS — Z955 Presence of coronary angioplasty implant and graft: Secondary | ICD-10-CM | POA: Diagnosis not present

## 2015-11-21 DIAGNOSIS — M8580 Other specified disorders of bone density and structure, unspecified site: Secondary | ICD-10-CM | POA: Diagnosis not present

## 2015-11-21 DIAGNOSIS — M545 Low back pain: Secondary | ICD-10-CM | POA: Diagnosis not present

## 2015-11-21 DIAGNOSIS — R0602 Shortness of breath: Secondary | ICD-10-CM | POA: Diagnosis not present

## 2015-11-21 DIAGNOSIS — Z09 Encounter for follow-up examination after completed treatment for conditions other than malignant neoplasm: Secondary | ICD-10-CM | POA: Diagnosis not present

## 2015-11-21 DIAGNOSIS — F339 Major depressive disorder, recurrent, unspecified: Secondary | ICD-10-CM | POA: Diagnosis not present

## 2015-11-21 DIAGNOSIS — R358 Other polyuria: Secondary | ICD-10-CM | POA: Diagnosis present

## 2015-11-21 DIAGNOSIS — J9621 Acute and chronic respiratory failure with hypoxia: Secondary | ICD-10-CM | POA: Diagnosis not present

## 2015-11-21 DIAGNOSIS — F419 Anxiety disorder, unspecified: Secondary | ICD-10-CM | POA: Diagnosis not present

## 2015-11-21 DIAGNOSIS — Z87891 Personal history of nicotine dependence: Secondary | ICD-10-CM | POA: Diagnosis not present

## 2015-11-21 DIAGNOSIS — J441 Chronic obstructive pulmonary disease with (acute) exacerbation: Secondary | ICD-10-CM | POA: Diagnosis not present

## 2015-11-21 DIAGNOSIS — N184 Chronic kidney disease, stage 4 (severe): Secondary | ICD-10-CM | POA: Diagnosis not present

## 2015-11-21 DIAGNOSIS — S5001XA Contusion of right elbow, initial encounter: Secondary | ICD-10-CM | POA: Diagnosis not present

## 2015-11-21 DIAGNOSIS — K219 Gastro-esophageal reflux disease without esophagitis: Secondary | ICD-10-CM | POA: Diagnosis not present

## 2015-11-21 DIAGNOSIS — Z9981 Dependence on supplemental oxygen: Secondary | ICD-10-CM | POA: Diagnosis not present

## 2015-11-21 DIAGNOSIS — M25521 Pain in right elbow: Secondary | ICD-10-CM | POA: Diagnosis not present

## 2015-11-21 DIAGNOSIS — I5032 Chronic diastolic (congestive) heart failure: Secondary | ICD-10-CM | POA: Diagnosis not present

## 2015-11-21 DIAGNOSIS — M6281 Muscle weakness (generalized): Secondary | ICD-10-CM | POA: Diagnosis not present

## 2015-11-21 DIAGNOSIS — J449 Chronic obstructive pulmonary disease, unspecified: Secondary | ICD-10-CM | POA: Diagnosis not present

## 2015-11-21 DIAGNOSIS — M25551 Pain in right hip: Secondary | ICD-10-CM | POA: Diagnosis not present

## 2015-11-21 DIAGNOSIS — I482 Chronic atrial fibrillation, unspecified: Secondary | ICD-10-CM

## 2015-11-21 DIAGNOSIS — I480 Paroxysmal atrial fibrillation: Secondary | ICD-10-CM | POA: Diagnosis not present

## 2015-11-21 DIAGNOSIS — Z515 Encounter for palliative care: Secondary | ICD-10-CM | POA: Diagnosis present

## 2015-11-21 DIAGNOSIS — I4891 Unspecified atrial fibrillation: Secondary | ICD-10-CM | POA: Diagnosis not present

## 2015-11-21 DIAGNOSIS — R269 Unspecified abnormalities of gait and mobility: Secondary | ICD-10-CM | POA: Diagnosis not present

## 2015-11-21 DIAGNOSIS — Z7901 Long term (current) use of anticoagulants: Secondary | ICD-10-CM | POA: Diagnosis not present

## 2015-11-21 DIAGNOSIS — S52021D Displaced fracture of olecranon process without intraarticular extension of right ulna, subsequent encounter for closed fracture with routine healing: Secondary | ICD-10-CM | POA: Diagnosis not present

## 2015-11-21 DIAGNOSIS — I130 Hypertensive heart and chronic kidney disease with heart failure and stage 1 through stage 4 chronic kidney disease, or unspecified chronic kidney disease: Secondary | ICD-10-CM | POA: Diagnosis not present

## 2015-11-21 DIAGNOSIS — R41841 Cognitive communication deficit: Secondary | ICD-10-CM | POA: Diagnosis not present

## 2015-11-21 DIAGNOSIS — I10 Essential (primary) hypertension: Secondary | ICD-10-CM | POA: Diagnosis not present

## 2015-11-21 DIAGNOSIS — N183 Chronic kidney disease, stage 3 (moderate): Secondary | ICD-10-CM | POA: Diagnosis not present

## 2015-11-21 DIAGNOSIS — N179 Acute kidney failure, unspecified: Secondary | ICD-10-CM | POA: Diagnosis not present

## 2015-11-21 DIAGNOSIS — S32401A Unspecified fracture of right acetabulum, initial encounter for closed fracture: Secondary | ICD-10-CM | POA: Diagnosis not present

## 2015-11-21 DIAGNOSIS — S32511A Fracture of superior rim of right pubis, initial encounter for closed fracture: Secondary | ICD-10-CM | POA: Diagnosis not present

## 2015-11-21 DIAGNOSIS — W1830XA Fall on same level, unspecified, initial encounter: Secondary | ICD-10-CM | POA: Diagnosis not present

## 2015-11-21 DIAGNOSIS — J189 Pneumonia, unspecified organism: Secondary | ICD-10-CM | POA: Diagnosis not present

## 2015-11-21 DIAGNOSIS — S32810A Multiple fractures of pelvis with stable disruption of pelvic ring, initial encounter for closed fracture: Secondary | ICD-10-CM | POA: Diagnosis not present

## 2015-11-21 DIAGNOSIS — Z79899 Other long term (current) drug therapy: Secondary | ICD-10-CM | POA: Diagnosis not present

## 2015-11-21 DIAGNOSIS — R0902 Hypoxemia: Secondary | ICD-10-CM | POA: Diagnosis not present

## 2015-11-21 DIAGNOSIS — I5031 Acute diastolic (congestive) heart failure: Secondary | ICD-10-CM | POA: Diagnosis not present

## 2015-11-21 DIAGNOSIS — D519 Vitamin B12 deficiency anemia, unspecified: Secondary | ICD-10-CM | POA: Diagnosis not present

## 2015-11-21 DIAGNOSIS — R739 Hyperglycemia, unspecified: Secondary | ICD-10-CM | POA: Diagnosis not present

## 2015-11-21 DIAGNOSIS — E785 Hyperlipidemia, unspecified: Secondary | ICD-10-CM | POA: Diagnosis not present

## 2015-11-21 DIAGNOSIS — R000 Tachycardia, unspecified: Secondary | ICD-10-CM | POA: Diagnosis not present

## 2015-11-21 DIAGNOSIS — E559 Vitamin D deficiency, unspecified: Secondary | ICD-10-CM | POA: Diagnosis not present

## 2015-11-21 DIAGNOSIS — J188 Other pneumonia, unspecified organism: Secondary | ICD-10-CM | POA: Diagnosis not present

## 2015-11-21 DIAGNOSIS — Z7983 Long term (current) use of bisphosphonates: Secondary | ICD-10-CM | POA: Diagnosis not present

## 2015-11-21 DIAGNOSIS — T501X5A Adverse effect of loop [high-ceiling] diuretics, initial encounter: Secondary | ICD-10-CM | POA: Diagnosis not present

## 2015-11-21 DIAGNOSIS — J9600 Acute respiratory failure, unspecified whether with hypoxia or hypercapnia: Secondary | ICD-10-CM | POA: Diagnosis not present

## 2015-11-21 DIAGNOSIS — H269 Unspecified cataract: Secondary | ICD-10-CM | POA: Diagnosis not present

## 2015-11-21 DIAGNOSIS — Z7984 Long term (current) use of oral hypoglycemic drugs: Secondary | ICD-10-CM | POA: Diagnosis not present

## 2015-11-21 DIAGNOSIS — Z7189 Other specified counseling: Secondary | ICD-10-CM | POA: Diagnosis not present

## 2015-11-21 DIAGNOSIS — F329 Major depressive disorder, single episode, unspecified: Secondary | ICD-10-CM | POA: Diagnosis present

## 2015-11-21 DIAGNOSIS — M25552 Pain in left hip: Secondary | ICD-10-CM | POA: Diagnosis not present

## 2015-11-21 DIAGNOSIS — S32491A Other specified fracture of right acetabulum, initial encounter for closed fracture: Secondary | ICD-10-CM | POA: Diagnosis not present

## 2015-11-21 DIAGNOSIS — M810 Age-related osteoporosis without current pathological fracture: Secondary | ICD-10-CM | POA: Diagnosis not present

## 2015-11-21 DIAGNOSIS — S300XXA Contusion of lower back and pelvis, initial encounter: Secondary | ICD-10-CM

## 2015-11-21 DIAGNOSIS — Y95 Nosocomial condition: Secondary | ICD-10-CM | POA: Diagnosis present

## 2015-11-21 DIAGNOSIS — Z66 Do not resuscitate: Secondary | ICD-10-CM | POA: Diagnosis present

## 2015-11-21 DIAGNOSIS — E1122 Type 2 diabetes mellitus with diabetic chronic kidney disease: Secondary | ICD-10-CM | POA: Diagnosis not present

## 2015-11-21 DIAGNOSIS — R600 Localized edema: Secondary | ICD-10-CM | POA: Diagnosis not present

## 2015-11-21 DIAGNOSIS — S52021A Displaced fracture of olecranon process without intraarticular extension of right ulna, initial encounter for closed fracture: Secondary | ICD-10-CM | POA: Diagnosis not present

## 2015-11-21 DIAGNOSIS — J440 Chronic obstructive pulmonary disease with acute lower respiratory infection: Secondary | ICD-10-CM | POA: Diagnosis not present

## 2015-11-21 DIAGNOSIS — I2510 Atherosclerotic heart disease of native coronary artery without angina pectoris: Secondary | ICD-10-CM | POA: Diagnosis not present

## 2015-11-21 LAB — POCT INR: INR: 4

## 2015-11-21 NOTE — Progress Notes (Signed)
   Subjective:    Patient ID: [name], female    DOB: 1933-01-13, 81 y.o.   MRN: [id number]  [facility]PI [facility]. This is the 3 time she has been in the hospital in the last month. She says that she is feeling better. Has fallen since being out of hospital- landed on her butt and says that it is sore now. Says it does not hurt to walk only sore when sitting.   Transitional care management planned to start - was discharged from hospital  11/17/15 * [facility] - will stay about 30 days.   Review of Systems  Constitutional: Negative.   HENT: Negative.   Respiratory: Positive for shortness of breath (no more then usual).   Cardiovascular: Negative.   Genitourinary: Negative.   Neurological: Negative.   Psychiatric/Behavioral: Negative.   All other systems reviewed and are negative.      Objective:   Physical Exam  Constitutional: She is oriented to person, place, and time. She appears well-developed and well-nourished.  Cardiovascular: Normal rate, regular rhythm and normal heart sounds.   Pulmonary/Chest: Effort normal.  Diminished breath sounds on right- continuous O2 via nasal cannulla.  Musculoskeletal:  Mild contusion on buttocks with mild tenderness to touch No pain with gait.  Neurological: She is alert and oriented to person, place, and time.  Skin: Skin is warm.  Psychiatric: She has a normal mood and affect. Her behavior is normal. Judgment and thought content normal.   BP 81/52 mmHg  Pulse 80  Temp(Src) 96.8 F (36 C) (Oral)  Ht 5' (1.524 m)  Wt 165 lb (74.844 kg)  BMI 32.22 kg/m2  LMP 03/27/1983        Assessment & Plan:   1. Hospital discharge follow-up   2. Chronic atrial fibrillation (HCC)   3. Dehydration    Hold coumadin for 2 days then continue current dose. - recheck in 1 week Med list given to patient to take too rehab Follow up  as needed while in rehab  Mary-Margaret Daphine DeutscherMartin, [name]

## 2015-11-21 NOTE — Telephone Encounter (Signed)
Saw patient in office to day and addressed

## 2015-11-24 DIAGNOSIS — E860 Dehydration: Secondary | ICD-10-CM | POA: Diagnosis not present

## 2015-11-24 DIAGNOSIS — I4891 Unspecified atrial fibrillation: Secondary | ICD-10-CM | POA: Diagnosis not present

## 2015-11-24 DIAGNOSIS — J9600 Acute respiratory failure, unspecified whether with hypoxia or hypercapnia: Secondary | ICD-10-CM | POA: Diagnosis not present

## 2015-11-24 DIAGNOSIS — J449 Chronic obstructive pulmonary disease, unspecified: Secondary | ICD-10-CM | POA: Diagnosis not present

## 2015-11-24 DIAGNOSIS — M6281 Muscle weakness (generalized): Secondary | ICD-10-CM | POA: Diagnosis not present

## 2015-11-25 ENCOUNTER — Ambulatory Visit: Payer: Medicare Other | Admitting: *Deleted

## 2015-11-25 ENCOUNTER — Other Ambulatory Visit: Payer: Self-pay | Admitting: *Deleted

## 2015-11-25 NOTE — Patient Outreach (Signed)
11/25/15- RN CM drove to patient's home, RN CM spoke with Ricki RodriguezAmanda Shoemaker (caregiver) over the phone and she states [facility] "to hopefully get stronger because she started falling every day at home".  Plan is for short term 2-3 weeks and then bring pt back home.  PLAN RN CM to follow up on pt disposition in 2 weeks Collaborate with skilled nursing facility as needed.  Irving ShowsJulie Leiam [place] [facility], BSN [facility] Community Care Coordinator [phone]

## 2015-11-26 ENCOUNTER — Encounter: Payer: Medicare Other | Admitting: Family Medicine

## 2015-12-01 ENCOUNTER — Telehealth: Payer: Self-pay | Admitting: Nurse Practitioner

## 2015-12-02 NOTE — Telephone Encounter (Signed)
Patient is in rehab in [place] and she wants to know if they should be weighing her daily. They are only weighing her only once a week.  They are giving her torsemide 20mg  2 pills a day and she is wetting herself. She is only gaining 1/2 pound and then loose 1/2 pound. She would like to know what you recommend. Please advise

## 2015-12-02 NOTE — Telephone Encounter (Signed)
Please call and see what they need

## 2015-12-02 NOTE — Telephone Encounter (Signed)
Daily wieghts would be helpful to determne her amount of fluid retention.

## 2015-12-02 NOTE — Telephone Encounter (Signed)
[name] aware.

## 2015-12-08 ENCOUNTER — Other Ambulatory Visit: Payer: Self-pay | Admitting: *Deleted

## 2015-12-08 NOTE — Patient Outreach (Signed)
12/08/15- [facility], spoke with Lori/ Shantal for pt update on plan of care, pt is scheduled for tentative discharge at end of week and will return home, facility has RN CM contact number and states they will call if this plan changes.  PLAN RN CM to follow up with skilled facility/ family at end of week.  Irving ShowsJulie Desta Bujak [facility], BSN [facility] Community Care Coordinator [phone]

## 2015-12-09 ENCOUNTER — Emergency Department (HOSPITAL_COMMUNITY): Payer: Medicare Other

## 2015-12-09 ENCOUNTER — Inpatient Hospital Stay (HOSPITAL_COMMUNITY)
Admission: EM | Admit: 2015-12-09 | Discharge: 2015-12-18 | DRG: 510 | Disposition: A | Discharge status: Hospice | Payer: Medicare Other | Attending: Internal Medicine | Admitting: Internal Medicine

## 2015-12-09 ENCOUNTER — Encounter (HOSPITAL_COMMUNITY): Payer: Self-pay | Admitting: Emergency Medicine

## 2015-12-09 DIAGNOSIS — S329XXA Fracture of unspecified parts of lumbosacral spine and pelvis, initial encounter for closed fracture: Secondary | ICD-10-CM

## 2015-12-09 DIAGNOSIS — Z955 Presence of coronary angioplasty implant and graft: Secondary | ICD-10-CM | POA: Diagnosis not present

## 2015-12-09 DIAGNOSIS — E119 Type 2 diabetes mellitus without complications: Secondary | ICD-10-CM

## 2015-12-09 DIAGNOSIS — Y95 Nosocomial condition: Secondary | ICD-10-CM | POA: Diagnosis present

## 2015-12-09 DIAGNOSIS — J449 Chronic obstructive pulmonary disease, unspecified: Secondary | ICD-10-CM | POA: Diagnosis present

## 2015-12-09 DIAGNOSIS — Z515 Encounter for palliative care: Secondary | ICD-10-CM | POA: Diagnosis not present

## 2015-12-09 DIAGNOSIS — E785 Hyperlipidemia, unspecified: Secondary | ICD-10-CM | POA: Diagnosis present

## 2015-12-09 DIAGNOSIS — Z9981 Dependence on supplemental oxygen: Secondary | ICD-10-CM

## 2015-12-09 DIAGNOSIS — Z7189 Other specified counseling: Secondary | ICD-10-CM | POA: Insufficient documentation

## 2015-12-09 DIAGNOSIS — S52023A Displaced fracture of olecranon process without intraarticular extension of unspecified ulna, initial encounter for closed fracture: Secondary | ICD-10-CM | POA: Diagnosis present

## 2015-12-09 DIAGNOSIS — F329 Major depressive disorder, single episode, unspecified: Secondary | ICD-10-CM | POA: Diagnosis present

## 2015-12-09 DIAGNOSIS — N183 Chronic kidney disease, stage 3 unspecified: Secondary | ICD-10-CM | POA: Insufficient documentation

## 2015-12-09 DIAGNOSIS — J441 Chronic obstructive pulmonary disease with (acute) exacerbation: Secondary | ICD-10-CM | POA: Diagnosis not present

## 2015-12-09 DIAGNOSIS — M25552 Pain in left hip: Secondary | ICD-10-CM | POA: Diagnosis not present

## 2015-12-09 DIAGNOSIS — H269 Unspecified cataract: Secondary | ICD-10-CM | POA: Diagnosis present

## 2015-12-09 DIAGNOSIS — J9621 Acute and chronic respiratory failure with hypoxia: Secondary | ICD-10-CM

## 2015-12-09 DIAGNOSIS — I6529 Occlusion and stenosis of unspecified carotid artery: Secondary | ICD-10-CM | POA: Diagnosis not present

## 2015-12-09 DIAGNOSIS — Z7901 Long term (current) use of anticoagulants: Secondary | ICD-10-CM

## 2015-12-09 DIAGNOSIS — E860 Dehydration: Secondary | ICD-10-CM | POA: Diagnosis not present

## 2015-12-09 DIAGNOSIS — S32401A Unspecified fracture of right acetabulum, initial encounter for closed fracture: Secondary | ICD-10-CM | POA: Diagnosis present

## 2015-12-09 DIAGNOSIS — I5031 Acute diastolic (congestive) heart failure: Secondary | ICD-10-CM | POA: Diagnosis present

## 2015-12-09 DIAGNOSIS — S32810A Multiple fractures of pelvis with stable disruption of pelvic ring, initial encounter for closed fracture: Secondary | ICD-10-CM | POA: Diagnosis not present

## 2015-12-09 DIAGNOSIS — I130 Hypertensive heart and chronic kidney disease with heart failure and stage 1 through stage 4 chronic kidney disease, or unspecified chronic kidney disease: Secondary | ICD-10-CM | POA: Diagnosis not present

## 2015-12-09 DIAGNOSIS — S32491A Other specified fracture of right acetabulum, initial encounter for closed fracture: Secondary | ICD-10-CM | POA: Diagnosis not present

## 2015-12-09 DIAGNOSIS — I252 Old myocardial infarction: Secondary | ICD-10-CM

## 2015-12-09 DIAGNOSIS — J440 Chronic obstructive pulmonary disease with acute lower respiratory infection: Secondary | ICD-10-CM | POA: Diagnosis present

## 2015-12-09 DIAGNOSIS — E1122 Type 2 diabetes mellitus with diabetic chronic kidney disease: Secondary | ICD-10-CM | POA: Diagnosis not present

## 2015-12-09 DIAGNOSIS — S52021D Displaced fracture of olecranon process without intraarticular extension of right ulna, subsequent encounter for closed fracture with routine healing: Secondary | ICD-10-CM | POA: Diagnosis not present

## 2015-12-09 DIAGNOSIS — R358 Other polyuria: Secondary | ICD-10-CM | POA: Diagnosis present

## 2015-12-09 DIAGNOSIS — S52021B Displaced fracture of olecranon process without intraarticular extension of right ulna, initial encounter for open fracture type I or II: Secondary | ICD-10-CM | POA: Diagnosis not present

## 2015-12-09 DIAGNOSIS — M1990 Unspecified osteoarthritis, unspecified site: Secondary | ICD-10-CM | POA: Diagnosis not present

## 2015-12-09 DIAGNOSIS — I2510 Atherosclerotic heart disease of native coronary artery without angina pectoris: Secondary | ICD-10-CM | POA: Diagnosis not present

## 2015-12-09 DIAGNOSIS — J189 Pneumonia, unspecified organism: Secondary | ICD-10-CM | POA: Diagnosis not present

## 2015-12-09 DIAGNOSIS — I480 Paroxysmal atrial fibrillation: Secondary | ICD-10-CM | POA: Diagnosis not present

## 2015-12-09 DIAGNOSIS — Z7984 Long term (current) use of oral hypoglycemic drugs: Secondary | ICD-10-CM

## 2015-12-09 DIAGNOSIS — N179 Acute kidney failure, unspecified: Secondary | ICD-10-CM | POA: Diagnosis not present

## 2015-12-09 DIAGNOSIS — R739 Hyperglycemia, unspecified: Secondary | ICD-10-CM | POA: Diagnosis not present

## 2015-12-09 DIAGNOSIS — R600 Localized edema: Secondary | ICD-10-CM | POA: Diagnosis present

## 2015-12-09 DIAGNOSIS — Z87891 Personal history of nicotine dependence: Secondary | ICD-10-CM | POA: Diagnosis not present

## 2015-12-09 DIAGNOSIS — R0602 Shortness of breath: Secondary | ICD-10-CM | POA: Insufficient documentation

## 2015-12-09 DIAGNOSIS — M8580 Other specified disorders of bone density and structure, unspecified site: Secondary | ICD-10-CM | POA: Diagnosis not present

## 2015-12-09 DIAGNOSIS — F32A Depression, unspecified: Secondary | ICD-10-CM

## 2015-12-09 DIAGNOSIS — R509 Fever, unspecified: Secondary | ICD-10-CM

## 2015-12-09 DIAGNOSIS — R000 Tachycardia, unspecified: Secondary | ICD-10-CM | POA: Diagnosis not present

## 2015-12-09 DIAGNOSIS — K219 Gastro-esophageal reflux disease without esophagitis: Secondary | ICD-10-CM | POA: Diagnosis present

## 2015-12-09 DIAGNOSIS — S32301D Unspecified fracture of right ilium, subsequent encounter for fracture with routine healing: Secondary | ICD-10-CM | POA: Diagnosis not present

## 2015-12-09 DIAGNOSIS — M25551 Pain in right hip: Secondary | ICD-10-CM | POA: Diagnosis not present

## 2015-12-09 DIAGNOSIS — S52021A Displaced fracture of olecranon process without intraarticular extension of right ulna, initial encounter for closed fracture: Principal | ICD-10-CM | POA: Diagnosis present

## 2015-12-09 DIAGNOSIS — I4891 Unspecified atrial fibrillation: Secondary | ICD-10-CM | POA: Diagnosis present

## 2015-12-09 DIAGNOSIS — M25521 Pain in right elbow: Secondary | ICD-10-CM | POA: Diagnosis not present

## 2015-12-09 DIAGNOSIS — M810 Age-related osteoporosis without current pathological fracture: Secondary | ICD-10-CM | POA: Diagnosis present

## 2015-12-09 DIAGNOSIS — S5001XA Contusion of right elbow, initial encounter: Secondary | ICD-10-CM | POA: Diagnosis not present

## 2015-12-09 DIAGNOSIS — S32511A Fracture of superior rim of right pubis, initial encounter for closed fracture: Secondary | ICD-10-CM | POA: Diagnosis not present

## 2015-12-09 DIAGNOSIS — Z66 Do not resuscitate: Secondary | ICD-10-CM | POA: Diagnosis present

## 2015-12-09 DIAGNOSIS — W1830XA Fall on same level, unspecified, initial encounter: Secondary | ICD-10-CM | POA: Diagnosis present

## 2015-12-09 DIAGNOSIS — M545 Low back pain: Secondary | ICD-10-CM | POA: Diagnosis present

## 2015-12-09 DIAGNOSIS — L03116 Cellulitis of left lower limb: Secondary | ICD-10-CM | POA: Diagnosis present

## 2015-12-09 DIAGNOSIS — J410 Simple chronic bronchitis: Secondary | ICD-10-CM

## 2015-12-09 DIAGNOSIS — T501X5A Adverse effect of loop [high-ceiling] diuretics, initial encounter: Secondary | ICD-10-CM | POA: Diagnosis not present

## 2015-12-09 DIAGNOSIS — R0902 Hypoxemia: Secondary | ICD-10-CM

## 2015-12-09 DIAGNOSIS — Z7983 Long term (current) use of bisphosphonates: Secondary | ICD-10-CM

## 2015-12-09 DIAGNOSIS — I10 Essential (primary) hypertension: Secondary | ICD-10-CM

## 2015-12-09 DIAGNOSIS — Z79899 Other long term (current) drug therapy: Secondary | ICD-10-CM

## 2015-12-09 LAB — CBC WITH DIFFERENTIAL/PLATELET
Basophils Absolute: 0 10*3/uL (ref 0.0–0.1)
Basophils Relative: 0 %
Eosinophils Absolute: 0.2 10*3/uL (ref 0.0–0.7)
Eosinophils Relative: 2 %
HCT: 35.7 % — ABNORMAL LOW (ref 36.0–46.0)
Hemoglobin: 10.9 g/dL — ABNORMAL LOW (ref 12.0–15.0)
Lymphocytes Relative: 6 %
Lymphs Abs: 0.7 10*3/uL (ref 0.7–4.0)
MCH: 29.4 pg (ref 26.0–34.0)
MCHC: 30.5 g/dL (ref 30.0–36.0)
MCV: 96.2 fL (ref 78.0–100.0)
Monocytes Absolute: 0.5 10*3/uL (ref 0.1–1.0)
Monocytes Relative: 5 %
Neutro Abs: 10.1 10*3/uL — ABNORMAL HIGH (ref 1.7–7.7)
Neutrophils Relative %: 87 %
Platelets: 194 10*3/uL (ref 150–400)
RBC: 3.71 MIL/uL — ABNORMAL LOW (ref 3.87–5.11)
RDW: 15.5 % (ref 11.5–15.5)
WBC: 11.6 10*3/uL — ABNORMAL HIGH (ref 4.0–10.5)

## 2015-12-09 LAB — BASIC METABOLIC PANEL
Anion gap: 6 (ref 5–15)
Anion gap: 6 (ref 5–15)
BUN: 20 mg/dL (ref 6–20)
BUN: 21 mg/dL — ABNORMAL HIGH (ref 6–20)
CO2: 35 mmol/L — ABNORMAL HIGH (ref 22–32)
CO2: 33 mmol/L — ABNORMAL HIGH (ref 22–32)
Calcium: 8.7 mg/dL — ABNORMAL LOW (ref 8.9–10.3)
Calcium: 8.4 mg/dL — ABNORMAL LOW (ref 8.9–10.3)
Chloride: 97 mmol/L — ABNORMAL LOW (ref 101–111)
Chloride: 99 mmol/L — ABNORMAL LOW (ref 101–111)
Creatinine, Ser: 1.58 mg/dL — ABNORMAL HIGH (ref 0.44–1.00)
Creatinine, Ser: 1.56 mg/dL — ABNORMAL HIGH (ref 0.44–1.00)
GFR calc Af Amer: 35 mL/min — ABNORMAL LOW (ref >60)
GFR calc Af Amer: 35 mL/min — ABNORMAL LOW (ref >60)
GFR calc non Af Amer: 30 mL/min — ABNORMAL LOW (ref >60)
GFR calc non Af Amer: 30 mL/min — ABNORMAL LOW (ref >60)
Glucose, Bld: 126 mg/dL — ABNORMAL HIGH (ref 65–99)
Glucose, Bld: 148 mg/dL — ABNORMAL HIGH (ref 65–99)
Potassium: 4.1 mmol/L (ref 3.5–5.1)
Potassium: 4.3 mmol/L (ref 3.5–5.1)
Sodium: 138 mmol/L (ref 135–145)
Sodium: 138 mmol/L (ref 135–145)

## 2015-12-09 LAB — PROTIME-INR
INR: 2.25 — ABNORMAL HIGH (ref 0.00–1.49)
INR: 2.01 — ABNORMAL HIGH (ref 0.00–1.49)
Prothrombin Time: 24.6 seconds — ABNORMAL HIGH (ref 11.6–15.2)
Prothrombin Time: 22.6 seconds — ABNORMAL HIGH (ref 11.6–15.2)

## 2015-12-09 LAB — GLUCOSE, CAPILLARY
Glucose-Capillary: 125 mg/dL — ABNORMAL HIGH (ref 65–99)
Glucose-Capillary: 157 mg/dL — ABNORMAL HIGH (ref 65–99)

## 2015-12-09 LAB — BRAIN NATRIURETIC PEPTIDE: B Natriuretic Peptide: 1201.2 pg/mL — ABNORMAL HIGH (ref 0.0–100.0)

## 2015-12-09 MED ORDER — HYDROCODONE-ACETAMINOPHEN 5-325 MG PO TABS
1.0000 | ORAL_TABLET | Freq: Two times a day (BID) | ORAL | Status: DC | PRN
  Administered 2015-12-09 – 2015-12-14 (×8): 1 via ORAL
  Filled 2015-12-09 (×8): qty 1

## 2015-12-09 MED ORDER — ONDANSETRON HCL 4 MG PO TABS: 4.0000 mg | ORAL_TABLET | Freq: Four times a day (QID) | ORAL | Status: DC | PRN

## 2015-12-09 MED ORDER — HYDROMORPHONE HCL 1 MG/ML IJ SOLN
0.5000 mg | INTRAMUSCULAR | Status: DC | PRN
  Administered 2015-12-09 – 2015-12-10 (×2): 0.5 mg via INTRAVENOUS
  Filled 2015-12-09 (×2): qty 1

## 2015-12-09 MED ORDER — METOPROLOL TARTRATE 100 MG PO TABS
100.0000 mg | ORAL_TABLET | Freq: Two times a day (BID) | ORAL | Status: DC
  Administered 2015-12-09 – 2015-12-18 (×17): 100 mg via ORAL
  Filled 2015-12-09 (×18): qty 1

## 2015-12-09 MED ORDER — DILTIAZEM HCL ER COATED BEADS 120 MG PO CP24
360.0000 mg | ORAL_CAPSULE | Freq: Every day | ORAL | Status: DC
  Administered 2015-12-09 – 2015-12-11 (×3): 360 mg via ORAL
  Filled 2015-12-09: qty 3
  Filled 2015-12-09: qty 2
  Filled 2015-12-09: qty 3
  Filled 2015-12-09: qty 2

## 2015-12-09 MED ORDER — ALENDRONATE SODIUM 70 MG PO TABS: 70.0000 mg | ORAL_TABLET | ORAL | Status: DC

## 2015-12-09 MED ORDER — OXYCODONE HCL 5 MG PO TABS
5.0000 mg | ORAL_TABLET | ORAL | Status: DC | PRN
  Administered 2015-12-11: 10 mg via ORAL
  Administered 2015-12-11 – 2015-12-13 (×5): 5 mg via ORAL
  Filled 2015-12-09 (×3): qty 1
  Filled 2015-12-09: qty 2
  Filled 2015-12-09 (×3): qty 1

## 2015-12-09 MED ORDER — PANTOPRAZOLE SODIUM 40 MG PO TBEC
40.0000 mg | DELAYED_RELEASE_TABLET | Freq: Every day | ORAL | Status: DC
  Administered 2015-12-09 – 2015-12-18 (×10): 40 mg via ORAL
  Filled 2015-12-09 (×9): qty 1

## 2015-12-09 MED ORDER — TORSEMIDE 20 MG PO TABS
40.0000 mg | ORAL_TABLET | Freq: Every day | ORAL | Status: DC
  Administered 2015-12-09 – 2015-12-10 (×2): 40 mg via ORAL
  Filled 2015-12-09 (×3): qty 2

## 2015-12-09 MED ORDER — CLINDAMYCIN PHOSPHATE 600 MG/50ML IV SOLN
600.0000 mg | Freq: Three times a day (TID) | INTRAVENOUS | Status: DC
  Administered 2015-12-09 – 2015-12-14 (×13): 600 mg via INTRAVENOUS
  Filled 2015-12-09 (×15): qty 50

## 2015-12-09 MED ORDER — IPRATROPIUM-ALBUTEROL 0.5-2.5 (3) MG/3ML IN SOLN: 3.0000 mL | RESPIRATORY_TRACT | Status: DC | PRN

## 2015-12-09 MED ORDER — SODIUM CHLORIDE 0.9 % IV SOLN
INTRAVENOUS | Status: DC
  Administered 2015-12-09: 21:00:00 via INTRAVENOUS

## 2015-12-09 MED ORDER — NITROGLYCERIN 0.4 MG SL SUBL
0.4000 mg | SUBLINGUAL_TABLET | SUBLINGUAL | Status: DC | PRN
Start: 2015-12-09 — End: 2015-12-18
  Filled 2015-12-09: qty 1

## 2015-12-09 MED ORDER — INSULIN ASPART 100 UNIT/ML ~~LOC~~ SOLN
0.0000 [IU] | Freq: Three times a day (TID) | SUBCUTANEOUS | Status: DC
  Administered 2015-12-10 – 2015-12-11 (×3): 1 [IU] via SUBCUTANEOUS
  Administered 2015-12-13: 2 [IU] via SUBCUTANEOUS
  Administered 2015-12-13: 3 [IU] via SUBCUTANEOUS
  Administered 2015-12-14 (×2): 2 [IU] via SUBCUTANEOUS
  Administered 2015-12-14 – 2015-12-15 (×3): 1 [IU] via SUBCUTANEOUS
  Administered 2015-12-15: 2 [IU] via SUBCUTANEOUS
  Administered 2015-12-16: 3 [IU] via SUBCUTANEOUS
  Administered 2015-12-17: 2 [IU] via SUBCUTANEOUS
  Administered 2015-12-17: 1 [IU] via SUBCUTANEOUS
  Administered 2015-12-18 (×2): 2 [IU] via SUBCUTANEOUS

## 2015-12-09 MED ORDER — BUSPIRONE HCL 15 MG PO TABS
7.5000 mg | ORAL_TABLET | Freq: Two times a day (BID) | ORAL | Status: DC
  Administered 2015-12-09 – 2015-12-18 (×15): 7.5 mg via ORAL
  Filled 2015-12-09 (×23): qty 1

## 2015-12-09 MED ORDER — ISOSORBIDE MONONITRATE ER 30 MG PO TB24
30.0000 mg | ORAL_TABLET | Freq: Every day | ORAL | Status: DC
  Administered 2015-12-09 – 2015-12-18 (×10): 30 mg via ORAL
  Filled 2015-12-09 (×10): qty 1

## 2015-12-09 MED ORDER — CITALOPRAM HYDROBROMIDE 20 MG PO TABS
20.0000 mg | ORAL_TABLET | Freq: Every day | ORAL | Status: DC
  Administered 2015-12-09 – 2015-12-18 (×10): 20 mg via ORAL
  Filled 2015-12-09 (×11): qty 1

## 2015-12-09 MED ORDER — PHYTONADIONE 1 MG/0.5 ML ORAL SOLUTION
2.0000 mg | Freq: Once | ORAL | Status: AC
  Administered 2015-12-09: 2 mg via ORAL
  Filled 2015-12-09: qty 1

## 2015-12-09 MED ORDER — HYDROCODONE-ACETAMINOPHEN 5-325 MG PO TABS
1.0000 | ORAL_TABLET | Freq: Once | ORAL | Status: AC
Start: 2015-12-09 — End: 2015-12-09
  Administered 2015-12-09: 1 via ORAL
  Filled 2015-12-09: qty 1

## 2015-12-09 MED ORDER — FLUTICASONE PROPIONATE 50 MCG/ACT NA SUSP
2.0000 | Freq: Every day | NASAL | Status: DC
  Administered 2015-12-09 – 2015-12-18 (×4): 2 via NASAL
  Filled 2015-12-09 (×2): qty 16

## 2015-12-09 MED ORDER — DEXAMETHASONE SODIUM PHOSPHATE 10 MG/ML IJ SOLN
10.0000 mg | Freq: Once | INTRAMUSCULAR | Status: AC
  Administered 2015-12-09: 10 mg via INTRAVENOUS
  Filled 2015-12-09: qty 1

## 2015-12-09 MED ORDER — ONDANSETRON HCL 4 MG/2ML IJ SOLN: 4.0000 mg | Freq: Four times a day (QID) | INTRAMUSCULAR | Status: DC | PRN

## 2015-12-09 MED ORDER — LORATADINE 10 MG PO TABS
10.0000 mg | ORAL_TABLET | Freq: Every day | ORAL | Status: DC
  Administered 2015-12-09 – 2015-12-18 (×10): 10 mg via ORAL
  Filled 2015-12-09 (×10): qty 1

## 2015-12-09 MED ORDER — POTASSIUM CHLORIDE CRYS ER 20 MEQ PO TBCR
20.0000 meq | EXTENDED_RELEASE_TABLET | Freq: Every day | ORAL | Status: DC
  Administered 2015-12-09 – 2015-12-18 (×9): 20 meq via ORAL
  Filled 2015-12-09 (×9): qty 1

## 2015-12-09 NOTE — H&P (Signed)
[facility] History and Physical  Katie FergusonDessie A Melendez ZOX:[id number] DOB: 1933-11-25 DOA: 12/09/2015  Referring physician: Emergency Department PCP: Bennie PieriniMARTIN,Katie MARGARET, [name]   CHIEF COMPLAINT:  Fall with right elbow fracture    HPI: [name] is a 81 y.o. female  with multiple medical problems limited to coronary artery disease/history of remote PCI, COPD on home O2, chronic kidney disease and atrial fibrillation on Coumadin. Patient currently residing at rehabilitation facility. This morning she lost her balance and fell resulting in a fracture of her right elbow and mildly displaced fractures of the right superior and inferior pubic rami. No other medical complaints. She does endorse polyuria which started when diuretics were recently increased. No dysuria or hematuria.  ED COURSE:     Labs:   Creatinine 1.5 a (better than baseline of 1.9) Wbc 11 point 6, hemoglobin 10.9, MCV 96 INR 2.25  Urinalysis:     CXR:    Mild cardiomegaly, mild vascular congestion  EKG:    Age not entered, assumed to be 81 years old for purpose of ECG interpretation Atrial fibrillation Borderline repolarization abnormality Confirmed by HORTON MD, COURTNEY ([id number]) on 12/09/2015 6:11:21 AM QT 422   Medications  HYDROcodone-acetaminophen (NORCO/VICODIN) 5-325 MG per tablet 1 tablet (1 tablet Oral Given 12/09/15 0524)    Review of Systems  Constitutional: Negative.   HENT: Negative.   Eyes: Negative.   Respiratory: Positive for wheezing.   Cardiovascular: Positive for leg swelling.  Gastrointestinal: Negative.   Genitourinary: Positive for frequency.  Musculoskeletal: Positive for falls.  Skin: Negative.   Neurological: Negative.   Endo/Heme/Allergies: Negative.   Psychiatric/Behavioral: Negative.     Past Medical History  Diagnosis Date  . Hypertension   . CAD (coronary artery disease) 5/96  . Hyperlipidemia   . Chronic atrial fibrillation (HCC)     Coumadin therapy   .  Osteopenia   . Renal insufficiency   . Closed right ankle fracture   . CHF (congestive heart failure) (HCC)   . LBP (low back pain)   . Arthritis   . Cataract   . COPD (chronic obstructive pulmonary disease) (HCC)     wears O2 at home as needed  . Esophageal stricture   . Esophageal dysmotility 09/2011    seen on barium esophagram.  . Carotid artery occlusion   . Myocardial infarction [facility](HCC)    Past Surgical History  Procedure Laterality Date  . Coronary angioplasty with stent placement  1993    RCA  . Mole removed  2013    umbilicus  . Esophagogastroduodenoscopy  2012, 05/2013,09/2013    dilation of esophagus and stricture in 09/2011, 05/2013.  meat disimpaction 09/2013  . Esophagogastroduodenoscopy N/A 10/18/2013    Procedure: ESOPHAGOGASTRODUODENOSCOPY (EGD);  Surgeon: Meryl DareMalcolm T Stark, MD;  [facility] END[facility];  Service: Endoscopy;  Laterality: N/A;  possible food impaction  . Cardiac catheterization  2006    LAD 40%, Diag 40%, CFX minimal dz, RCA w/ 40% ISR, AM2 70-80%, EF 55%     SOCIAL HISTORY:  reports that she quit smoking about 2 months ago. Her smoking use included Cigarettes. She smoked 1.00 pack per day. She has never used smokeless tobacco. She reports that she does not drink alcohol or use illicit drugs. Lives:  Currently residing at rehabilitation center   Assistive devices: Dan HumphreysWalker needed for ambulation.   No Known Allergies  Family History  Problem Relation Age of Onset  . Colon cancer Neg Hx   . Esophageal cancer Neg Hx   .  Stomach cancer Neg Hx   . Rectal cancer Neg Hx   . Hyperlipidemia Mother   . Hypertension Mother   . Hyperlipidemia Father   . Heart disease Father     After age 21  . Cancer Sister     Lung  . Hyperlipidemia Sister   . Hypertension Sister     Prior to Admission medications   Medication Sig Start Date End Date Taking? Authorizing Provider  alendronate (FOSAMAX) 70 MG tablet Take 1 tablet (70 mg total) by mouth once a week. Take  with a full glass of water on an empty stomach. 09/16/15  Yes Katie Daphine Deutscher, [name]  atorvastatin (LIPITOR) 40 MG tablet TAKE 1 TABLET (40 MG TOTAL) BY MOUTH DAILY. 09/16/15  Yes Katie Daphine Deutscher, [name]  busPIRone (BUSPAR) 7.5 MG tablet Take 1 tablet (7.5 mg total) by mouth 2 (two) times daily. . 09/24/15  Yes Mechele Claude, MD  citalopram (CELEXA) 20 MG tablet Take 1 tablet (20 mg total) by mouth daily. 10/14/15  Yes Catarina Hartshorn, MD  dexlansoprazole (DEXILANT) 60 MG capsule TAKE 1 CAPSULE (60 MG TOTAL) BY MOUTH DAILY. 09/16/15  Yes Katie Daphine Deutscher, [name]  diltiazem (CARDIZEM CD) 360 MG 24 hr capsule Take 1 capsule (360 mg total) by mouth daily. 10/14/15  Yes Catarina Hartshorn, MD  fluticasone (FLONASE) 50 MCG/ACT nasal spray Place 2 sprays into both nostrils daily. 10/14/15  Yes David Tat, MD  Fluticasone-Salmeterol (ADVAIR DISKUS) 250-50 MCG/DOSE AEPB Inhale 1 puff into the lungs 2 (two) times daily. 06/16/15  Yes Katie Daphine Deutscher, [name]  glucose blood (ONETOUCH VERIO) test strip Test 1x per day and prn   Dx- e11.9 10/17/15  Yes Katie Daphine Deutscher, [name]  HYDROcodone-acetaminophen (NORCO/VICODIN) 5-325 MG per tablet Take 1 tablet by mouth 2 (two) times daily as needed for moderate pain. Patient taking differently: Take 1 tablet by mouth every 6 (six) hours as needed for moderate pain.  09/10/15  Yes Katie Daphine Deutscher, [name]  isosorbide mononitrate (IMDUR) 30 MG 24 hr tablet Take 1 tablet (30 mg total) by mouth daily. 11/17/15  Yes Leroy Sea, MD  Lancets [facility] ULTRASOFT) lancets Use as instructed 10/23/15  Yes Katie Daphine Deutscher, [name]  loratadine (CLARITIN) 10 MG tablet Take 1 tablet (10 mg total) by mouth daily. 11/18/15  Yes Katie Daphine Deutscher, [name]  metFORMIN (GLUMETZA) 500 MG (MOD) 24 hr tablet Take 500 mg by mouth daily with breakfast.   Yes Historical Provider, MD  metoprolol (LOPRESSOR) 100 MG tablet Take 1 tablet (100 mg total) by mouth 2 (two) times daily. 10/06/15  Yes  Katie Daphine Deutscher, [name]  nitroGLYCERIN (NITROSTAT) 0.4 MG SL tablet Place 1 tablet (0.4 mg total) under the tongue every 5 (five) minutes as needed for chest pain. 04/10/14  Yes Rollene Rotunda, MD  potassium chloride SA (K-DUR,KLOR-CON) 20 MEQ tablet Take 20 mEq by mouth daily.   Yes Historical Provider, MD  tiotropium (SPIRIVA HANDIHALER) 18 MCG inhalation capsule PLACE 1 CAPSULE (18 MCG TOTAL) INTO INHALER AND INHALE DAILY. 09/16/15  Yes Katie Daphine Deutscher, [name]  torsemide (DEMADEX) 20 MG tablet Take 40 mg by mouth daily.   Yes Historical Provider, MD  warfarin (COUMADIN) 5 MG tablet Take 0.5-1 tablets (2.5-5 mg total) by mouth See admin instructions. Takes 1 tablet ( ) every Tuesdays and Fridays and 1/2 tablet (2.5 mg) all other days. Patient taking differently: Take 2.5-5 mg by mouth See admin instructions. Takes 1 tablet ( ) every Tuesdays, Thursday. 1/2 tablet (2.5 mg) all other days. 09/16/15  Yes Katie Daphine Deutscher, [name]  PHYSICAL EXAM: Filed Vitals:   12/09/15 0515 12/09/15 0600 12/09/15 0604 12/09/15 0606  BP: 127/101 130/94    Pulse: 117 121 109 69  Temp:      TempSrc:      Resp: SpO2: 93%  91% 94%    Wt Readings from Last 3 Encounters:  11/21/15 74.844 kg (165 lb)  11/17/15 72.938 kg (160 lb 12.8 oz)  11/10/15 70.761 kg (156 lb)    General:  Pleasant white female. Appears calm and comfortable Eyes: PER, normal lids, irises & conjunctiva ENT: grossly normal hearing, lips & tongue Neck: no LAD, no masses Cardiac: RRR, 2-3 plus bilateral lower extremity edema. Left lower extremity erythematous but not unusually warm. Negative Homan sign.  Respiratory: Respirations even and unlabored. On 3-4 L /min O2 per [place]. Normal respiratory effort Expiratory wheezing in lungs. Difficult to reposition patient for further auscultation given her fracture. .   Abdomen: soft, non-distended, non-tender, active bowel sounds. No obvious masses.  Skin: Left lower extremity  erythematous Musculoskeletal: grossly normal tone BUE/BLE Psychiatric: grossly normal mood and affect, speech fluent and appropriate Neurologic: grossly non-focal.         LABS ON ADMISSION:    Basic Metabolic Panel:  Recent Labs Lab 12/09/15 0534  NA 138  K 4.1  CL 97*  CO2 35*  GLUCOSE 126*  BUN 20  CREATININE 1.58*  CALCIUM 8.7*   CBC:  Recent Labs Lab 12/09/15 0534  WBC 11.6*  NEUTROABS 10.1*  HGB 10.9*  HCT 35.7*  MCV 96.2  PLT 194    Creatinine clearance cannot be calculated (Unknown ideal weight.)  Radiological Exams on Admission: Dg Elbow Complete Right  12/09/2015  CLINICAL DATA:  Status post fall, with right elbow pain. Initial encounter. EXAM: RIGHT ELBOW - COMPLETE 3+ VIEW COMPARISON:  None. FINDINGS: There is a significantly displaced fracture of the olecranon, demonstrating up to 3 cm of displacement. Overlying soft tissue swelling is noted. An elbow joint effusion is seen. IMPRESSION: Significantly displaced fracture of the olecranon, demonstrating up to 3 cm of displacement. Overlying soft tissue swelling and elbow joint effusion noted. Electronically Signed   By: Roanna Raider M.D.   On: 12/09/2015 05:54   Dg Chest Portable 1 View  12/09/2015  CLINICAL DATA:  Acute onset of hypoxia.  Initial encounter. EXAM: PORTABLE CHEST 1 VIEW COMPARISON:  Chest radiograph performed 11/14/2015 FINDINGS: The lungs are well-aerated. Mild vascular congestion noted. Mild bilateral atelectasis is seen. There is no evidence of pleural effusion or pneumothorax. The cardiomediastinal silhouette is mildly enlarged. No acute osseous abnormalities are seen. IMPRESSION: Mild vascular congestion and mild cardiomegaly noted. Mild bilateral atelectasis seen. Electronically Signed   By: Roanna Raider M.D.   On: 12/09/2015 07:01   Dg Hip Unilat  With Pelvis 2-3 Views Right  12/09/2015  CLINICAL DATA:  Status post fall, with right hip pain. Initial encounter. EXAM: DG HIP (WITH OR  WITHOUT PELVIS) 2-3V RIGHT COMPARISON:  None. FINDINGS: There are mildly displaced fractures of the right superior and inferior pubic rami. Extension of the right superior pubic ramus fracture to the acetabulum cannot be excluded. Both femoral heads are seated normally within their respective acetabula. The proximal right femur appears intact. Degenerative change is noted along the lower lumbar spine. The sacroiliac joints are unremarkable in appearance. The visualized bowel gas pattern is grossly unremarkable in appearance. Scattered vascular calcifications are seen. IMPRESSION: Mildly displaced fractures of the right superior and inferior pubic rami.  Extension of the right superior pubic ramus fracture to the acetabulum cannot be excluded. Electronically Signed   By: Roanna Raider M.D.   On: 12/09/2015 05:52   Echo August 2016 Impressions:  - Moderate LVH. Patient was in atrial fibrillation throughout the study, making LVEF assessment less accurate. Grade 3-4 diastolic dysfunction. Severe LA and moderate RA enlargement LVEF 50-55% Aortic valve calcification with mildly restricted leaflet movement and no evidence of aortic stenosis by doppler. Peak gradient 12 mmHg, mean gradient 6 mmHg. Peak velocity 1.7 m/s Moderate mitral annular calcification  ASSESSMENT / PLAN    Right olecranon fracture. Spoke with Dr. Luiz Blare (Ortho), plan is for elbow surgery tomorrow afternoon, she may need a pin placed in pelvis as well.  -Admit to Telemetry  -NPO after midnight -analgesics -consult social worker- will likely need to return to the [facility] after discharge  Acute on chronic respiratory failure with hypoxia / COPD, On home 02. Wheezing on exam. Non-productive cough started yesterday) 02 sats normal but on 3-4 litersMild vascular congestion on CXR. COPD exacerbation not excluded.  -nebulizers -continue 02 to maintain 02 sats 88-92% -Continue home diuretics so volume overload doesn't  compound breathing problems.  -consider steroids  CAD/ remote PCI / diastolic dysfunction. Echo August 2016 revealed moderate left ventricular hypertrophy, grade 3-4 diastolic dysfunction. Left ventricular ejection fraction 50-55%. -continue diuretics -continue BB -strict I&0 -daily weights  Bilateral lower extremity edema.  -check BNP -no pulmonary edema on CXR.  -Obtain doppler of LLE (erythematous) though doubt DVT as she is on coumadin with therapeutic INR -continue home diuretics.  -elevate legs  Polyuria. Likely secondary to recent increase in diuretics - u/a  Results pending   Atrial fibrillation, rate in 120's. On chronic coumadin. INR therapeutic.  -Monitor on Telemetry -Will need to hold coumadin, give Vit K in anticipation of surgery tomorrow afternoon  Hypertension. BP normal / stable in ED. -Continue home anti-hypertensive meds  CKD, stage III. Creatinine actually better than her baseline. -Continue home diuretics for now -Follow renal function closely -Avoid nephrotoxic medications  Diabetes mellitus without complication.  -Hold home oral diabetic medications. -Monitor CBGs, start sliding scale insulin .  Gastroesophageal reflux disease without esophagitis.  -Continue PPI therapy   Osteoporosis. On Fosamax at home  Depression, stable. Continue home antidepressant.   CONSULTANTS:     Code Status: DNR DVT Prophylaxis: On coumadin Family Communication:  Patient alert, oriented and understands plan of care.    Disposition Plan: Discharge back to rehab in 3-4 days   Time spent: 60 minutes Willette Cluster  NP [facility] Pager [phone]

## 2015-12-09 NOTE — ED Notes (Signed)
Consulting MD at bedside

## 2015-12-09 NOTE — ED Notes (Signed)
Inpatient RN unavailable for report, will call back. 

## 2015-12-09 NOTE — Consult Note (Signed)
Reason for Consult:81 year old female with right hip and right elbow fractures Referring Physician: hospitalists  Hadleigh A Muha is an 81 y.o. female.  HPI: the patient is an 81 year old frail female who fell earlier today.  She suffered fractures of the right hip and right elbow and we are consult for management.  She is a long-term Coumadin user and has a significant hematoma over the area of the elbow.  She is to be admitted to the medicine service for evaluation and we are consult for management of the fractures.  Past Medical History  Diagnosis Date  . Hypertension   . CAD (coronary artery disease) 5/96  . Edema   . Hyperlipidemia   . Chronic atrial fibrillation (HCC)     Coumadin therapy   . Osteopenia   . Renal insufficiency   . Closed right ankle fracture   . CHF (congestive heart failure) ([place])   . LBP (low back pain)   . Arthritis   . Cataract   . COPD (chronic obstructive pulmonary disease) ([place])     wears O2 at home as needed  . Esophageal stricture   . Esophageal dysmotility 09/2011    seen on barium esophagram.  . Carotid artery occlusion   . Myocardial infarction [facility])     Past Surgical History  Procedure Laterality Date  . Coronary angioplasty with stent placement  1993    RCA  . Mole removed  4034    umbilicus  . Esophagogastroduodenoscopy  2012, 05/2013,09/2013    dilation of esophagus and stricture in 09/2011, 05/2013.  meat disimpaction 09/2013  . Esophagogastroduodenoscopy N/A 10/18/2013    Procedure: ESOPHAGOGASTRODUODENOSCOPY (EGD);  Surgeon: Ladene Artist, MD;  Location: [facility] ENDOSCOPY;  Service: Endoscopy;  Laterality: N/A;  possible food impaction  . Cardiac catheterization  2006    LAD 40%, Diag 40%, CFX minimal dz, RCA w/ 40% ISR, AM2 70-80%, EF 55%     Family History  Problem Relation Age of Onset  . Colon cancer Neg Hx   . Esophageal cancer Neg Hx   . Stomach cancer Neg Hx   . Rectal cancer Neg Hx   . Hyperlipidemia Mother   .  Hypertension Mother   . Hyperlipidemia Father   . Heart disease Father     After age 65  . Cancer Sister     Lung  . Hyperlipidemia Sister   . Hypertension Sister     Social History:  reports that she quit smoking about 2 months ago. Her smoking use included Cigarettes. She smoked 1.00 pack per day. She has never used smokeless tobacco. She reports that she does not drink alcohol or use illicit drugs.  Allergies: No Known Allergies  Medications: I have reviewed the patient's current medications.  Results for orders placed or performed during the hospital encounter of 12/09/15 (from the past 48 hour(s))  CBC with Differential     Status: Abnormal   Collection Time: 12/09/15  5:34 AM  Result Value Ref Range   WBC 11.6 (H) 4.0 - 10.5 K/uL   RBC 3.71 (L) 3.87 - 5.11 MIL/uL   Hemoglobin 10.9 (L) 12.0 - 15.0 g/dL   HCT 35.7 (L) 36.0 - 46.0 %   MCV 96.2 78.0 - 100.0 fL   MCH 29.4 26.0 - 34.0 pg   MCHC 30.5 30.0 - 36.0 g/dL   RDW 15.5 11.5 - 15.5 %   Platelets 194 150 - 400 K/uL   Neutrophils Relative % 87 %   Neutro Abs 10.1 (  H) 1.7 - 7.7 K/uL   Lymphocytes Relative 6 %   Lymphs Abs 0.7 0.7 - 4.0 K/uL   Monocytes Relative 5 %   Monocytes Absolute 0.5 0.1 - 1.0 K/uL   Eosinophils Relative 2 %   Eosinophils Absolute 0.2 0.0 - 0.7 K/uL   Basophils Relative 0 %   Basophils Absolute 0.0 0.0 - 0.1 K/uL  Basic metabolic panel     Status: Abnormal   Collection Time: 12/09/15  5:34 AM  Result Value Ref Range   Sodium 138 135 - 145 mmol/L   Potassium 4.1 3.5 - 5.1 mmol/L   Chloride 97 (L) 101 - 111 mmol/L   CO2 35 (H) 22 - 32 mmol/L   Glucose, Bld 126 (H) 65 - 99 mg/dL   BUN 20 6 - 20 mg/dL   Creatinine, Ser 1.58 (H) 0.44 - 1.00 mg/dL   Calcium 8.7 (L) 8.9 - 10.3 mg/dL   GFR calc non Af Amer 30 (L) >60 mL/min   GFR calc Af Amer 35 (L) >60 mL/min    Comment: (NOTE) The eGFR has been calculated using the CKD EPI equation. This calculation has not been validated in all clinical  situations. eGFR's persistently <60 mL/min signify possible Chronic Kidney Disease.    Anion gap 6 5 - 15  Protime-INR     Status: Abnormal   Collection Time: 12/09/15  5:34 AM  Result Value Ref Range   Prothrombin Time 24.6 (H) 11.6 - 15.2 seconds   INR 2.25 (H) 0.00 - 1.49    Dg Elbow Complete Right  12/09/2015  CLINICAL DATA:  Status post fall, with right elbow pain. Initial encounter. EXAM: RIGHT ELBOW - COMPLETE 3+ VIEW COMPARISON:  None. FINDINGS: There is a significantly displaced fracture of the olecranon, demonstrating up to 3 cm of displacement. Overlying soft tissue swelling is noted. An elbow joint effusion is seen. IMPRESSION: Significantly displaced fracture of the olecranon, demonstrating up to 3 cm of displacement. Overlying soft tissue swelling and elbow joint effusion noted. Electronically Signed   By: Garald Balding M.D.   On: 12/09/2015 05:54   Dg Hip Unilat  With Pelvis 2-3 Views Right  12/09/2015  CLINICAL DATA:  Status post fall, with right hip pain. Initial encounter. EXAM: DG HIP (WITH OR WITHOUT PELVIS) 2-3V RIGHT COMPARISON:  None. FINDINGS: There are mildly displaced fractures of the right superior and inferior pubic rami. Extension of the right superior pubic ramus fracture to the acetabulum cannot be excluded. Both femoral heads are seated normally within their respective acetabula. The proximal right femur appears intact. Degenerative change is noted along the lower lumbar spine. The sacroiliac joints are unremarkable in appearance. The visualized bowel gas pattern is grossly unremarkable in appearance. Scattered vascular calcifications are seen. IMPRESSION: Mildly displaced fractures of the right superior and inferior pubic rami. Extension of the right superior pubic ramus fracture to the acetabulum cannot be excluded. Electronically Signed   By: Garald Balding M.D.   On: 12/09/2015 05:52    ROS  ROS: I have reviewed the patient's review of systems thoroughly and  there are no positive responses as relates to the HPI. Blood pressure 130/94, pulse 69, temperature 98.2 F (36.8 C), temperature source Oral, resp. rate 21, last menstrual period 03/27/1983, SpO2 94 %. Physical Exam Well-developed well-nourished patient in no acute distress. Alert and oriented x3 HEENT:within normal limits Cardiac: Regular rate and rhythm Pulmonary: Lungs clear to auscultation Abdomen: Soft and nontender.  Normal active bowel sounds  Musculoskeletal: (right hip: Pain with range of motion.  Mild soft tissue swelling.  Neurovascularly intact distally.)  Right elbow: Significant ecchymosis and swelling with pain on range of motion. Assessment/Plan: 81 year old otherwise unhealthy female who fell earlier today.  She has superior and inferior pubic ramus fractures with extension into the right acetabulum.  This fracture essentially is nonoperative treatment early on.  If this were to displace it's possible that she could need surgery.  If she is going to the operating room for her elbow(which is likely) but I will decide later today(after I discussed the case with Dr. Gavin Pound who is on for upper extremity tonight).based on elevated INR I don't think she will be going to the operating room today. I we'll discuss the case with Dr. Marcelino Scot with the possibility of a single percutaneous screw to see if that would be helpfulif she is going to the operating room for her elbow. I have had a prolonged discussion with the patient regarding the risk and benefits of the surgical procedure.  The patient understands the risks include but are not limited to bleeding, infection and failure of the surgery to cure the problem and need for further surgery.  The patient understands there is a slight risk of death at the time of surgery.  The patient understands these risks along with the potential benefits and we will decide whether she would like to proceed with surgical intervention.  The patient will be  followed in the office in the postoperative period.  Arlisa Leclere L 12/09/2015, 6:52 AM

## 2015-12-09 NOTE — ED Provider Notes (Signed)
CSN: [id number]     Arrival date & time 12/09/15  0425 History   First MD Initiated Contact with Patient 12/09/15 0445     Chief Complaint  Patient presents with  . Fall     (Consider location/radiation/quality/duration/timing/severity/associated sxs/prior Treatment) HPI  This is an 81 year old fair female with a history of hypertension, coronary artery disease, atrial fibrillation on Coumadin who presents following a fall. Patient reports that she was trying to get her oxygen on when she lost her balance and fell. Denies hitting her head or loss of consciousness. She fell onto her right side and complains of right elbow and right hip pain. She denies any headache, shortness of breath, chest pain. Niece is at the bedside. She is noted left lower extremity redness which she thinks is new. Patient is currently in rehabilitation.  Recent admission for CHF and [place] [facility].  Past Medical History  Diagnosis Date  . Hypertension   . CAD (coronary artery disease) 5/96  . Edema   . Hyperlipidemia   . Chronic atrial fibrillation (HCC)     Coumadin therapy   . Osteopenia   . Renal insufficiency   . Closed right ankle fracture   . CHF (congestive heart failure) (HCC)   . LBP (low back pain)   . Arthritis   . Cataract   . COPD (chronic obstructive pulmonary disease) (HCC)     wears O2 at home as needed  . Esophageal stricture   . Esophageal dysmotility 09/2011    seen on barium esophagram.  . Carotid artery occlusion   . Myocardial infarction [facility](HCC)    Past Surgical History  Procedure Laterality Date  . Coronary angioplasty with stent placement  1993    RCA  . Mole removed  2013    umbilicus  . Esophagogastroduodenoscopy  2012, 05/2013,09/2013    dilation of esophagus and stricture in 09/2011, 05/2013.  meat disimpaction 09/2013  . Esophagogastroduodenoscopy N/A 10/18/2013    Procedure: ESOPHAGOGASTRODUODENOSCOPY (EGD);  Surgeon: Meryl DareMalcolm T Stark, MD;   [facility];  Service: Endoscopy;  Laterality: N/A;  possible food impaction  . Cardiac catheterization  2006    LAD 40%, Diag 40%, CFX minimal dz, RCA w/ 40% ISR, AM2 70-80%, EF 55%    Family History  Problem Relation Age of Onset  . Colon cancer Neg Hx   . Esophageal cancer Neg Hx   . Stomach cancer Neg Hx   . Rectal cancer Neg Hx   . Hyperlipidemia Mother   . Hypertension Mother   . Hyperlipidemia Father   . Heart disease Father     After age 79  . Cancer Sister     Lung  . Hyperlipidemia Sister   . Hypertension Sister    Social History  Substance Use Topics  . Smoking status: Former Smoker -- 1.00 packs/day    Types: Cigarettes    Quit date: 10/09/2015  . Smokeless tobacco: Never Used  . Alcohol Use: No     Comment: previous   OB History    No data available     Review of Systems  Constitutional: Negative for fever.  Respiratory: Negative for chest tightness and shortness of breath.   Cardiovascular: Negative for chest pain.  Gastrointestinal: Negative for nausea, vomiting and abdominal pain.  Genitourinary: Negative for dysuria.  Musculoskeletal: Negative for back pain.       Right hip pain, right elbow pain  Skin: Positive for color change and wound.  Neurological: Negative  for syncope, light-headedness and headaches.  All other systems reviewed and are negative.     Allergies  Review of patient's allergies indicates no known allergies.  Home Medications   Prior to Admission medications   Medication Sig Start Date End Date Taking? Authorizing Provider  alendronate (FOSAMAX) 70 MG tablet Take 1 tablet (70 mg total) by mouth once a week. Take with a full glass of water on an empty stomach. 09/16/15   Mary-Margaret Daphine Deutscher, [name]  atorvastatin (LIPITOR) 40 MG tablet TAKE 1 TABLET (40 MG TOTAL) BY MOUTH DAILY. 09/16/15   Mary-Margaret Daphine Deutscher, [name]  busPIRone (BUSPAR) 7.5 MG tablet Take 1 tablet (7.5 mg total) by mouth 2 (two) times daily. . 09/24/15    Mechele Claude, MD  citalopram (CELEXA) 20 MG tablet Take 1 tablet (20 mg total) by mouth daily. 10/14/15   Catarina Hartshorn, MD  dexlansoprazole (DEXILANT) 60 MG capsule TAKE 1 CAPSULE (60 MG TOTAL) BY MOUTH DAILY. 09/16/15   Mary-Margaret Daphine Deutscher, [name]  diltiazem (CARDIZEM CD) 360 MG 24 hr capsule Take 1 capsule (360 mg total) by mouth daily. 10/14/15   Catarina Hartshorn, MD  fluticasone (FLONASE) 50 MCG/ACT nasal spray Place 2 sprays into both nostrils daily. 10/14/15   Catarina Hartshorn, MD  Fluticasone-Salmeterol (ADVAIR DISKUS) 250-50 MCG/DOSE AEPB Inhale 1 puff into the lungs 2 (two) times daily. 06/16/15   Mary-Margaret Daphine Deutscher, [name]  glucose blood (ONETOUCH VERIO) test strip Test 1x per day and prn   Dx- e11.9 10/17/15   Mary-Margaret Daphine Deutscher, [name]  HYDROcodone-acetaminophen (NORCO/VICODIN) 5-325 MG per tablet Take 1 tablet by mouth 2 (two) times daily as needed for moderate pain. Patient taking differently: Take 1 tablet by mouth every 6 (six) hours as needed for moderate pain.  09/10/15   Mary-Margaret Daphine Deutscher, [name]  isosorbide mononitrate (IMDUR) 30 MG 24 hr tablet Take 1 tablet (30 mg total) by mouth daily. 11/17/15   Leroy Sea, MD  Lancets [facility] ULTRASOFT) lancets Use as instructed 10/23/15   Mary-Margaret Daphine Deutscher, [name]  loratadine (CLARITIN) 10 MG tablet Take 1 tablet (10 mg total) by mouth daily. 11/18/15   Mary-Margaret Daphine Deutscher, [name]  metFORMIN (GLUMETZA) 500 MG (MOD) 24 hr tablet Take 500 mg by mouth daily with breakfast.    Historical Provider, MD  metoprolol (LOPRESSOR) 100 MG tablet Take 1 tablet (100 mg total) by mouth 2 (two) times daily. 10/06/15   Mary-Margaret Daphine Deutscher, [name]  nitroGLYCERIN (NITROSTAT) 0.4 MG SL tablet Place 1 tablet (0.4 mg total) under the tongue every 5 (five) minutes as needed for chest pain. 04/10/14   Rollene Rotunda, MD  potassium chloride SA (K-DUR,KLOR-CON) 20 MEQ tablet Take 20 mEq by mouth daily.    Historical Provider, MD  tiotropium (SPIRIVA HANDIHALER) 18 MCG inhalation capsule  PLACE 1 CAPSULE (18 MCG TOTAL) INTO INHALER AND INHALE DAILY. 09/16/15   Mary-Margaret Daphine Deutscher, [name]  torsemide (DEMADEX) 20 MG tablet Take 40 mg by mouth daily.    Historical Provider, MD  warfarin (COUMADIN) 5 MG tablet Take 0.5-1 tablets (2.5-5 mg total) by mouth See admin instructions. Takes 1 tablet ( ) every Tuesdays and Fridays and 1/2 tablet (2.5 mg) all other days. Patient taking differently: Take 2.5-5 mg by mouth See admin instructions. Takes 1 tablet ( ) every Tuesdays, Thursday and Saturday. 1/2 tablet (2.5 mg) all other days. 09/16/15   Mary-Margaret Daphine Deutscher, [name]   BP 130/94 mmHg  Pulse 69  Temp(Src) 98.2 F (36.8 C) (Oral)  Resp 21  SpO2 94%  LMP 03/27/1983 Physical Exam  Constitutional: She is oriented to person, place, and time.  Elderly, ABC's intact, no acute distress  HENT:  Head: Normocephalic and atraumatic.  Mouth/Throat: Oropharynx is clear and moist.  Neck: Normal range of motion. Neck supple.  No midline C-spine tenderness, step-off, or deformity  Cardiovascular: Normal rate, regular rhythm and normal heart sounds.   Pulmonary/Chest: Effort normal. No respiratory distress. She has no wheezes.  Nasal cannula in place, coarse breath sounds bilaterally.  Abdominal: Soft. Bowel sounds are normal. There is no tenderness. There is no rebound.  Musculoskeletal: She exhibits edema.  2+ Bilateral lower extremity edema, symmetric Normal range of motion of right hip and knee Large hematoma noted to the right elbow, pain with range of motion of the right elbow, 2+ radial pulse  Neurological: She is alert and oriented to person, place, and time.  Skin: Skin is warm and dry.  Blanching erythema noted to the left shin, mild warmth noted  Psychiatric: She has a normal mood and affect.  Nursing note and vitals reviewed.   ED Course  Procedures (including critical care time) Labs Review Labs Reviewed  CBC WITH DIFFERENTIAL/PLATELET - Abnormal; Notable for the following:     WBC 11.6 (*)    RBC 3.71 (*)    Hemoglobin 10.9 (*)    HCT 35.7 (*)    Neutro Abs 10.1 (*)    All other components within normal limits  BASIC METABOLIC PANEL - Abnormal; Notable for the following:    Chloride 97 (*)    CO2 35 (*)    Glucose, Bld 126 (*)    Creatinine, Ser 1.58 (*)    Calcium 8.7 (*)    GFR calc non Af Amer 30 (*)    GFR calc Af Amer 35 (*)    All other components within normal limits  PROTIME-INR - Abnormal; Notable for the following:    Prothrombin Time 24.6 (*)    INR 2.25 (*)    All other components within normal limits  URINALYSIS, ROUTINE W REFLEX MICROSCOPIC (NOT AT [facility])    Imaging Review Dg Elbow Complete Right  12/09/2015  CLINICAL DATA:  Status post fall, with right elbow pain. Initial encounter. EXAM: RIGHT ELBOW - COMPLETE 3+ VIEW COMPARISON:  None. FINDINGS: There is a significantly displaced fracture of the olecranon, demonstrating up to 3 cm of displacement. Overlying soft tissue swelling is noted. An elbow joint effusion is seen. IMPRESSION: Significantly displaced fracture of the olecranon, demonstrating up to 3 cm of displacement. Overlying soft tissue swelling and elbow joint effusion noted. Electronically Signed   By: Roanna Raider M.D.   On: 12/09/2015 05:54   Dg Hip Unilat  With Pelvis 2-3 Views Right  12/09/2015  CLINICAL DATA:  Status post fall, with right hip pain. Initial encounter. EXAM: DG HIP (WITH OR WITHOUT PELVIS) 2-3V RIGHT COMPARISON:  None. FINDINGS: There are mildly displaced fractures of the right superior and inferior pubic rami. Extension of the right superior pubic ramus fracture to the acetabulum cannot be excluded. Both femoral heads are seated normally within their respective acetabula. The proximal right femur appears intact. Degenerative change is noted along the lower lumbar spine. The sacroiliac joints are unremarkable in appearance. The visualized bowel gas pattern is grossly unremarkable in appearance. Scattered  vascular calcifications are seen. IMPRESSION: Mildly displaced fractures of the right superior and inferior pubic rami. Extension of the right superior pubic ramus fracture to the acetabulum cannot be excluded. Electronically Signed   By: Beryle Beams.D.  On: 12/09/2015 05:52   I have personally reviewed and evaluated these images and lab results as part of my medical decision-making.   EKG Interpretation   Date/Time:  Tuesday December 09 2015 05:28:03 EST Ventricular Rate:  119 PR Interval:    QRS Duration: 93 QT Interval:  300 QTC Calculation: 422 R Axis:   78 Text Interpretation:  Age not entered, assumed to be  81 years old for  purpose of ECG interpretation Atrial fibrillation Borderline  repolarization abnormality Confirmed by HORTON  MD, COURTNEY ([id number]) on  12/09/2015 6:11:21 AM      MDM   Final diagnoses:  Olecranon fracture, right, closed, initial encounter  Pelvic fracture, closed, initial encounter  Hypoxia    Patient presents following a fall. ABCs intact. Initial vital signs are reassuring. She is on nasal cannula at her baseline. Recent history admission for CHF. She has pain and hematoma to the right elbow. Also reported pain to the right hip which is resolved. Basic labwork obtained. INR is therapeutic.  Mild leukocytosis of unknown significance. Other lab work is at baseline. Patient was noted to require more than her 2 L of home oxygen. For this reason, chest x-ray was added. She does have evidence of lower extremity edema. EKG shows atrial fibrillation and at times she is in rapid ventricular response. Currently she is rate controlled.  Plain films show a right olecranon fracture and right pubic rami fractures. Discussed with Dr. Luiz Blare and Dr. Orlan Leavens on call for hand and orthopedics. They will come up with a plan regarding treatment. Patient is a poor operative candidate at baseline. Will plan to admit to the hospitalist for further  management.      Shon Baton, MD 12/09/15 [phone]

## 2015-12-09 NOTE — Progress Notes (Signed)
Paged, return call from Jiles Haroldanielle Laliberte, [place] with [facility] regarding ortho consult earlier today with Dr. Luiz BlareGraves to request additional orders including including pain management [Vicodin Q 12 not effective managing pain], weightbearing status RUE and BLE, her extreme pain with placing on the bedpan to urinate, possible need for continuous IV fluid r/t declining PO intake, and probable plan for surgery 12/10/15 including NPO status. Stated would contact Dr. Luiz BlareGraves or his PA and call back with orders to address.

## 2015-12-09 NOTE — ED Notes (Signed)
Per EMS, pt fell on the way to the bathroom. Denies LOC or head trauma. C/o pain to R elbow & R hip. CBG 252. SpO2 90% on 6L [place]. Pt is on blood thinners.

## 2015-12-09 NOTE — Progress Notes (Signed)
Orthopedic Tech Progress Note Patient Details:  Alfredia FergusonDessie A Barbaro 1933/02/16 [id number]  Ortho Devices Type of Ortho Device: Ace wrap, Arm sling, Long leg splint Ortho Device/Splint Interventions: Application   Saul FordyceJennifer C Linnie Delgrande 12/09/2015, 8:47 AM

## 2015-12-09 NOTE — Consult Note (Signed)
   [facility] Consult   12/09/2015  Katie FergusonDessie A Melendez Dec 12, 1933 [id number] [facility]. [facility] for chronic disease management services.  Patient has been engaged by a [employer] Community Care Coordinator and CSW. [facility] staff aware of hospitalization.  Patient will receive a post discharge transition of care call and will be evaluated for monthly home visits for assessments and disease process education.  Will make Inpatient Case Manager aware that [facility] following for post hospital follow up needs, as appropriate. Of note, [facility] services does not replace or interfere with any services that are needed or arranged by inpatient case management or social work.  For additional questions or referrals please contact: Charlesetta ShanksVictoria Laketia Vicknair, RN BSN CCM [facility] Liaison  [phone] business mobile phone

## 2015-12-09 NOTE — Progress Notes (Signed)
Spoke with PA Orma FlamingBethune about patient's pain status/PO intake.  New orders received.  Pt resting more comfortably now. Spoke with niece [name] and updated on plan of care.  Will cont to monitor pt closely.  Erenest Rasheraldwell,River Ambrosio B, RN

## 2015-12-10 ENCOUNTER — Encounter (HOSPITAL_COMMUNITY)
Admission: EM | Disposition: A | Discharge status: Hospice | Payer: Self-pay | Source: Home / Self Care | Attending: Internal Medicine

## 2015-12-10 ENCOUNTER — Encounter (HOSPITAL_COMMUNITY): Payer: Self-pay | Admitting: Certified Registered Nurse Anesthetist

## 2015-12-10 ENCOUNTER — Observation Stay (HOSPITAL_COMMUNITY): Payer: Medicare Other | Admitting: Anesthesiology

## 2015-12-10 ENCOUNTER — Ambulatory Visit: Payer: Medicare Other | Admitting: Cardiology

## 2015-12-10 DIAGNOSIS — R600 Localized edema: Secondary | ICD-10-CM | POA: Diagnosis not present

## 2015-12-10 DIAGNOSIS — R000 Tachycardia, unspecified: Secondary | ICD-10-CM | POA: Diagnosis not present

## 2015-12-10 DIAGNOSIS — J189 Pneumonia, unspecified organism: Secondary | ICD-10-CM | POA: Diagnosis not present

## 2015-12-10 DIAGNOSIS — I5033 Acute on chronic diastolic (congestive) heart failure: Secondary | ICD-10-CM

## 2015-12-10 DIAGNOSIS — N179 Acute kidney failure, unspecified: Secondary | ICD-10-CM | POA: Diagnosis not present

## 2015-12-10 DIAGNOSIS — S52021D Displaced fracture of olecranon process without intraarticular extension of right ulna, subsequent encounter for closed fracture with routine healing: Secondary | ICD-10-CM

## 2015-12-10 DIAGNOSIS — S32301D Unspecified fracture of right ilium, subsequent encounter for fracture with routine healing: Secondary | ICD-10-CM

## 2015-12-10 DIAGNOSIS — Z7189 Other specified counseling: Secondary | ICD-10-CM | POA: Diagnosis not present

## 2015-12-10 DIAGNOSIS — J9621 Acute and chronic respiratory failure with hypoxia: Secondary | ICD-10-CM | POA: Diagnosis not present

## 2015-12-10 DIAGNOSIS — N183 Chronic kidney disease, stage 3 (moderate): Secondary | ICD-10-CM | POA: Diagnosis not present

## 2015-12-10 DIAGNOSIS — I480 Paroxysmal atrial fibrillation: Secondary | ICD-10-CM | POA: Diagnosis not present

## 2015-12-10 HISTORY — PX: ORIF ELBOW FRACTURE: SHX5031

## 2015-12-10 LAB — PROTIME-INR
INR: 1.54 — ABNORMAL HIGH (ref 0.00–1.49)
Prothrombin Time: 18.6 seconds — ABNORMAL HIGH (ref 11.6–15.2)

## 2015-12-10 LAB — BASIC METABOLIC PANEL
Anion gap: 9 (ref 5–15)
BUN: 23 mg/dL — ABNORMAL HIGH (ref 6–20)
CO2: 30 mmol/L (ref 22–32)
Calcium: 8.5 mg/dL — ABNORMAL LOW (ref 8.9–10.3)
Chloride: 96 mmol/L — ABNORMAL LOW (ref 101–111)
Creatinine, Ser: 1.54 mg/dL — ABNORMAL HIGH (ref 0.44–1.00)
GFR calc Af Amer: 36 mL/min — ABNORMAL LOW (ref >60)
GFR calc non Af Amer: 31 mL/min — ABNORMAL LOW (ref >60)
Glucose, Bld: 156 mg/dL — ABNORMAL HIGH (ref 65–99)
Potassium: 4.1 mmol/L (ref 3.5–5.1)
Sodium: 135 mmol/L (ref 135–145)

## 2015-12-10 LAB — GLUCOSE, CAPILLARY
Glucose-Capillary: 148 mg/dL — ABNORMAL HIGH (ref 65–99)
Glucose-Capillary: 126 mg/dL — ABNORMAL HIGH (ref 65–99)
Glucose-Capillary: 133 mg/dL — ABNORMAL HIGH (ref 65–99)
Glucose-Capillary: 101 mg/dL — ABNORMAL HIGH (ref 65–99)

## 2015-12-10 LAB — CBC
HCT: 32.4 % — ABNORMAL LOW (ref 36.0–46.0)
Hemoglobin: 9.9 g/dL — ABNORMAL LOW (ref 12.0–15.0)
MCH: 28.9 pg (ref 26.0–34.0)
MCHC: 30.6 g/dL (ref 30.0–36.0)
MCV: 94.7 fL (ref 78.0–100.0)
Platelets: 170 10*3/uL (ref 150–400)
RBC: 3.42 MIL/uL — ABNORMAL LOW (ref 3.87–5.11)
RDW: 15.4 % (ref 11.5–15.5)
WBC: 6.1 10*3/uL (ref 4.0–10.5)

## 2015-12-10 LAB — SURGICAL PCR SCREEN
MRSA, PCR: NEGATIVE
Staphylococcus aureus: NEGATIVE

## 2015-12-10 SURGERY — OPEN REDUCTION INTERNAL FIXATION (ORIF) ELBOW/OLECRANON FRACTURE
Anesthesia: General | Laterality: Right

## 2015-12-10 MED ORDER — LIDOCAINE HCL (CARDIAC) 20 MG/ML IV SOLN
INTRAVENOUS | Status: AC
Start: 2015-12-10 — End: 2015-12-10
  Filled 2015-12-10: qty 5

## 2015-12-10 MED ORDER — EPHEDRINE SULFATE 50 MG/ML IJ SOLN
INTRAMUSCULAR | Status: AC
  Filled 2015-12-10: qty 2

## 2015-12-10 MED ORDER — ROCURONIUM BROMIDE 50 MG/5ML IV SOLN
INTRAVENOUS | Status: AC
  Filled 2015-12-10: qty 1

## 2015-12-10 MED ORDER — ONDANSETRON HCL 4 MG/2ML IJ SOLN: 4.0000 mg | Freq: Once | INTRAMUSCULAR | Status: DC | PRN

## 2015-12-10 MED ORDER — FENTANYL CITRATE (PF) 100 MCG/2ML IJ SOLN
INTRAMUSCULAR | Status: DC | PRN
  Administered 2015-12-10: 25 ug via INTRAVENOUS

## 2015-12-10 MED ORDER — NALOXONE HCL 0.4 MG/ML IJ SOLN
INTRAMUSCULAR | Status: AC
  Filled 2015-12-10: qty 2

## 2015-12-10 MED ORDER — LEVALBUTEROL HCL 0.63 MG/3ML IN NEBU
0.6300 mg | INHALATION_SOLUTION | Freq: Once | RESPIRATORY_TRACT | Status: AC
  Administered 2015-12-10: 0.63 mg via RESPIRATORY_TRACT
  Filled 2015-12-10: qty 3

## 2015-12-10 MED ORDER — STERILE WATER FOR INJECTION IJ SOLN
INTRAMUSCULAR | Status: AC
  Filled 2015-12-10: qty 10

## 2015-12-10 MED ORDER — PROPOFOL 10 MG/ML IV BOLUS
INTRAVENOUS | Status: DC | PRN
  Administered 2015-12-10: 120 mg via INTRAVENOUS

## 2015-12-10 MED ORDER — LACTATED RINGERS IV SOLN
INTRAVENOUS | Status: DC | PRN
  Administered 2015-12-10: 17:00:00 via INTRAVENOUS

## 2015-12-10 MED ORDER — BUPIVACAINE HCL (PF) 0.5 % IJ SOLN
INTRAMUSCULAR | Status: DC | PRN
  Administered 2015-12-10: 10 mL

## 2015-12-10 MED ORDER — PHENYLEPHRINE HCL 10 MG/ML IJ SOLN
INTRAMUSCULAR | Status: DC | PRN
  Administered 2015-12-10 (×2): 80 ug via INTRAVENOUS
  Administered 2015-12-10: 120 ug via INTRAVENOUS

## 2015-12-10 MED ORDER — LIDOCAINE HCL (CARDIAC) 20 MG/ML IV SOLN
INTRAVENOUS | Status: DC | PRN
  Administered 2015-12-10: 60 mg via INTRAVENOUS

## 2015-12-10 MED ORDER — SODIUM CHLORIDE 0.9 % IJ SOLN
INTRAMUSCULAR | Status: AC
  Filled 2015-12-10: qty 20

## 2015-12-10 MED ORDER — FUROSEMIDE 10 MG/ML IJ SOLN
40.0000 mg | Freq: Once | INTRAMUSCULAR | Status: AC
  Administered 2015-12-10: 40 mg via INTRAVENOUS
  Filled 2015-12-10: qty 4

## 2015-12-10 MED ORDER — BUPIVACAINE HCL (PF) 0.5 % IJ SOLN
INTRAMUSCULAR | Status: AC
  Filled 2015-12-10: qty 30

## 2015-12-10 MED ORDER — CEFAZOLIN SODIUM-DEXTROSE 2-3 GM-% IV SOLR
2.0000 g | INTRAVENOUS | Status: DC
  Filled 2015-12-10: qty 50

## 2015-12-10 MED ORDER — SODIUM CHLORIDE 0.9 % IN NEBU
INHALATION_SOLUTION | RESPIRATORY_TRACT | Status: AC
  Filled 2015-12-10: qty 3

## 2015-12-10 MED ORDER — LACTATED RINGERS IV SOLN
INTRAVENOUS | Status: DC
  Administered 2015-12-10: 16:00:00 via INTRAVENOUS

## 2015-12-10 MED ORDER — 0.9 % SODIUM CHLORIDE (POUR BTL) OPTIME
TOPICAL | Status: DC | PRN
  Administered 2015-12-10: 1000 mL

## 2015-12-10 MED ORDER — CHLORHEXIDINE GLUCONATE 4 % EX LIQD
60.0000 mL | Freq: Once | CUTANEOUS | Status: DC
Start: 2015-12-10 — End: 2015-12-11
  Filled 2015-12-10: qty 60

## 2015-12-10 MED ORDER — HYDROMORPHONE HCL 1 MG/ML IJ SOLN: 0.2500 mg | INTRAMUSCULAR | Status: DC | PRN

## 2015-12-10 MED ORDER — ONDANSETRON HCL 4 MG/2ML IJ SOLN
INTRAMUSCULAR | Status: DC | PRN
  Administered 2015-12-10: 4 mg via INTRAVENOUS

## 2015-12-10 MED ORDER — HYDROCODONE-ACETAMINOPHEN 7.5-325 MG PO TABS
1.0000 | ORAL_TABLET | Freq: Once | ORAL | Status: DC | PRN
Start: 2015-12-10 — End: 2015-12-11

## 2015-12-10 MED ORDER — ALBUTEROL SULFATE HFA 108 (90 BASE) MCG/ACT IN AERS
INHALATION_SPRAY | RESPIRATORY_TRACT | Status: AC
  Filled 2015-12-10: qty 6.7

## 2015-12-10 SURGICAL SUPPLY — 63 items
BANDAGE ELASTIC 3 VELCRO ST LF (GAUZE/BANDAGES/DRESSINGS) ×1 IMPLANT
BANDAGE ELASTIC 4 VELCRO ST LF (GAUZE/BANDAGES/DRESSINGS) ×1 IMPLANT
BIT DRILL 2.5X2.75 QC CALB (BIT) ×1 IMPLANT
BIT DRILL CALIBRATED 2.7 (BIT) ×1 IMPLANT
BNDG CMPR 9X4 STRL LF SNTH (GAUZE/BANDAGES/DRESSINGS) ×1
BNDG COHESIVE 4X5 TAN STRL (GAUZE/BANDAGES/DRESSINGS) ×2 IMPLANT
BNDG ESMARK 4X9 LF (GAUZE/BANDAGES/DRESSINGS) ×2 IMPLANT
BNDG GAUZE ELAST 4 BULKY (GAUZE/BANDAGES/DRESSINGS) IMPLANT
CORDS BIPOLAR (ELECTRODE) ×2 IMPLANT
COVER MAYO STAND STRL (DRAPES) ×2 IMPLANT
COVER SURGICAL LIGHT HANDLE (MISCELLANEOUS) ×2 IMPLANT
CUFF TOURNIQUET SINGLE 18IN (TOURNIQUET CUFF) ×2 IMPLANT
CUFF TOURNIQUET SINGLE 24IN (TOURNIQUET CUFF) IMPLANT
DRAPE INCISE IOBAN 66X45 STRL (DRAPES) ×2 IMPLANT
DRAPE OEC MINIVIEW 54X84 (DRAPES) IMPLANT
DRSG ADAPTIC 3X8 NADH LF (GAUZE/BANDAGES/DRESSINGS) ×1 IMPLANT
GAUZE SPONGE 4X4 12PLY STRL (GAUZE/BANDAGES/DRESSINGS) IMPLANT
GAUZE XEROFORM 1X8 LF (GAUZE/BANDAGES/DRESSINGS) IMPLANT
GLOVE BIOGEL PI IND STRL 8 (GLOVE) ×2 IMPLANT
GLOVE BIOGEL PI INDICATOR 8 (GLOVE) ×2
GLOVE ECLIPSE 7.5 STRL STRAW (GLOVE) ×4 IMPLANT
GOWN STRL REUS W/ TWL LRG LVL3 (GOWN DISPOSABLE) ×1 IMPLANT
GOWN STRL REUS W/ TWL XL LVL3 (GOWN DISPOSABLE) ×2 IMPLANT
GOWN STRL REUS W/TWL LRG LVL3 (GOWN DISPOSABLE) ×2
GOWN STRL REUS W/TWL XL LVL3 (GOWN DISPOSABLE) ×4
KIT BASIN OR (CUSTOM PROCEDURE TRAY) ×2 IMPLANT
KIT ROOM TURNOVER OR (KITS) ×2 IMPLANT
LOOP VESSEL MAXI BLUE (MISCELLANEOUS) IMPLANT
MANIFOLD NEPTUNE II (INSTRUMENTS) ×2 IMPLANT
NDL HYPO 25GX1X1/2 BEV (NEEDLE) IMPLANT
NEEDLE HYPO 25GX1X1/2 BEV (NEEDLE) IMPLANT
NS IRRIG 1000ML POUR BTL (IV SOLUTION) ×2 IMPLANT
PACK ORTHO EXTREMITY (CUSTOM PROCEDURE TRAY) ×2 IMPLANT
PAD ARMBOARD 7.5X6 YLW CONV (MISCELLANEOUS) ×4 IMPLANT
PAD CAST 4YDX4 CTTN HI CHSV (CAST SUPPLIES) IMPLANT
PADDING CAST ABS 4INX4YD NS (CAST SUPPLIES) ×1
PADDING CAST ABS COTTON 4X4 ST (CAST SUPPLIES) IMPLANT
PADDING CAST COTTON 4X4 STRL (CAST SUPPLIES)
PLATE OLECRANON LRG (Plate) ×1 IMPLANT
SCREW LOCK CORT STAR 3.5X12 (Screw) ×1 IMPLANT
SCREW LOCK CORT STAR 3.5X14 (Screw) ×2 IMPLANT
SCREW LOCK CORT STAR 3.5X16 (Screw) ×1 IMPLANT
SCREW LOCK CORT STAR 3.5X42 (Screw) ×1 IMPLANT
SCREW LOW PROFILE 18MMX3.5MM (Screw) ×1 IMPLANT
SCREW LOW PROFILE 22MMX3.5MM (Screw) ×1 IMPLANT
SLING ARM FOAM STRAP LRG (SOFTGOODS) ×1 IMPLANT
SOLUTION BETADINE 4OZ (MISCELLANEOUS) ×2 IMPLANT
SPECIMEN JAR SMALL (MISCELLANEOUS) ×2 IMPLANT
SPLINT FIBERGLASS 3X35 (CAST SUPPLIES) ×1 IMPLANT
SPONGE GAUZE 4X4 12PLY STER LF (GAUZE/BANDAGES/DRESSINGS) ×1 IMPLANT
SPONGE SCRUB IODOPHOR (GAUZE/BANDAGES/DRESSINGS) ×2 IMPLANT
SUCTION FRAZIER TIP 10 FR DISP (SUCTIONS) IMPLANT
SUT MERSILENE 4 0 P 3 (SUTURE) IMPLANT
SUT PROLENE 4 0 PS 2 18 (SUTURE) IMPLANT
SUT VIC AB 2-0 CT1 27 (SUTURE)
SUT VIC AB 2-0 CT1 TAPERPNT 27 (SUTURE) IMPLANT
SYR CONTROL 10ML LL (SYRINGE) IMPLANT
TOWEL OR 17X24 6PK STRL BLUE (TOWEL DISPOSABLE) ×2 IMPLANT
TOWEL OR 17X26 10 PK STRL BLUE (TOWEL DISPOSABLE) ×4 IMPLANT
TUBE CONNECTING 12X1/4 (SUCTIONS) IMPLANT
UNDERPAD 30X30 INCONTINENT (UNDERPADS AND DIAPERS) ×2 IMPLANT
WASHER 3.5MM (Orthopedic Implant) ×2 IMPLANT
WATER STERILE IRR 1000ML POUR (IV SOLUTION) ×2 IMPLANT

## 2015-12-10 NOTE — Progress Notes (Addendum)
[facility] PROGRESS NOTE  KERRA GUILFOIL ZHY:[id number] DOB: [birth year] DOA: 12/09/2015 PCP: Bennie Pierini, [name]  brief narrative 81 year old female with history of coronary artery disease (remote PCI), COPD on home O2, chronic kidney disease, A. fib on Coumadin resumed of skilled nursing facility presented to the ED after sustaining a fall and fractured her right elbow and sustained a mildly displaced fracture of right superior inferior pubic rami. Patient admitted to hospitalist service and orthopedic surgeon consulted.  Assessment/Plan: Right olecranon fracture Plan on ORIF today. Nothing by mouth. Pain control with when necessary medications. Patient will need to return to skilled nursing facility upon discharge.  Right pelvic and acetabular fracture Recommend touchdown weightbearing on right side.  Acute on chronic hypoxic respiratory failure Patient is on home O2 (2 L). Was wheezing on admission. Also showed mild vascular congestion on chest x-ray with elevated BNP (200). Last echo from 07/2015 shows here for 50-50% with grade 3 diastolic dysfunction.. Continue O2 via nasal cannula. Resumed home diuretic (torsemide). Patient has bilateral pitting edema . Ordered a dose  of IV Lasix. Monitor I/O. D/c IV fluids.  Paroxysmal A. fib Holding Coumadin and received vitamin K on admission. INR improved 1.5 for today. Continue metoprolol and Cardizem. Resume anticoagulation postop..  Left lower leg cellulitis On empiric IV clindamycin.  CKD stage III Stable.  GERD Continue PPI  Type 2 diabetes mellitus Monitor with sliding scale coverage.  Depression Stable. Continue home medications.   DVT prophylaxis: Therapeutic on limited  Diet: Nothing by mouth  Code Status: DNR Family Communication: Nephew and niece at bedside Disposition Plan: return to SNF once stable postop, possible next 2-3 sdays   Consultants:  Orthopedics (Dr. Lonna Cobb)  Procedures:  For or  today  Antibiotics:  Clindamycin  HPI/Subjective: Seen and examined. Complains of pain in her right arm and hip.  Objective: Filed Vitals:   12/09/15 2116 12/10/15 0452  BP: 117/80 144/68  Pulse: 114 107  Temp: 98.7 F (37.1 C) 98.9 F (37.2 C)  Resp: 18 18    Intake/Output Summary (Last 24 hours) at 12/10/15 1219 Last data filed at 12/09/15 1800  Gross per 24 hour  Intake    360 ml  Output      0 ml  Net    360 ml   Filed Weights   12/09/15 0938 12/10/15 0452  Weight: 81.5 kg (179 lb 10.8 oz) 81.1 kg (178 lb 12.7 oz)    Exam:   General: Elderly obese female not in distress  HEENT: No pallor, moist oral mucosa, supple neck  Chest: Diminished bibasilar breath sounds  CVS: S1 and S2 irregular, no murmurs rub or gallop  GI: Soft, nondistended, nontender, bowel sounds present,   Musculoskeletal: Limited mobility of the right hip, Ace wrap Over right elbow, 1+ pitting edema bilaterally  CNS: Alert and oriented  Data Reviewed: Basic Metabolic Panel:  Recent Labs Lab 12/09/15 0534 12/09/15 1205 12/10/15 0349  NA 138 138 135  K 4.1 4.3 4.1  CL 97* 99* 96*  CO2 35* 33* 30  GLUCOSE 126* 148* 156*  BUN 20 21* 23*  CREATININE 1.58* 1.56* 1.54*  CALCIUM 8.7* 8.4* 8.5*   Liver Function Tests: No results for input(s): AST, ALT, ALKPHOS, BILITOT, PROT, ALBUMIN in the last 168 hours. No results for input(s): LIPASE, AMYLASE in the last 168 hours. No results for input(s): AMMONIA in the last 168 hours. CBC:  Recent Labs Lab 12/09/15 0534 12/10/15 0349  WBC 11.6* 6.1  NEUTROABS 10.1*  --  HGB 10.9* 9.9*  HCT 35.7* 32.4*  MCV 96.2 94.7  PLT 194 170   Cardiac Enzymes: No results for input(s): CKTOTAL, CKMB, CKMBINDEX, TROPONINI in the last 168 hours. BNP (last 3 results)  Recent Labs  11/12/15 0241 11/13/15 0845 12/09/15 1205  BNP 686.4* 882.8* 1201.2*    ProBNP (last 3 results) No results for input(s): PROBNP in the last 8760  hours.  CBG:  Recent Labs Lab 12/09/15 1803 12/09/15 2115 12/10/15 0624 12/10/15 0632 12/10/15 1136  GLUCAP 125* 157* 148* 126* 133*    No results found for this or any previous visit (from the past 240 hour(s)).   Studies: Dg Elbow Complete Right  12/09/2015  CLINICAL DATA:  Status post fall, with right elbow pain. Initial encounter. EXAM: RIGHT ELBOW - COMPLETE 3+ VIEW COMPARISON:  None. FINDINGS: There is a significantly displaced fracture of the olecranon, demonstrating up to 3 cm of displacement. Overlying soft tissue swelling is noted. An elbow joint effusion is seen. IMPRESSION: Significantly displaced fracture of the olecranon, demonstrating up to 3 cm of displacement. Overlying soft tissue swelling and elbow joint effusion noted. Electronically Signed   By: Roanna Raider M.D.   On: 12/09/2015 05:54   Dg Chest Portable 1 View  12/09/2015  CLINICAL DATA:  Acute onset of hypoxia.  Initial encounter. EXAM: PORTABLE CHEST 1 VIEW COMPARISON:  Chest radiograph performed 11/14/2015 FINDINGS: The lungs are well-aerated. Mild vascular congestion noted. Mild bilateral atelectasis is seen. There is no evidence of pleural effusion or pneumothorax. The cardiomediastinal silhouette is mildly enlarged. No acute osseous abnormalities are seen. IMPRESSION: Mild vascular congestion and mild cardiomegaly noted. Mild bilateral atelectasis seen. Electronically Signed   By: Roanna Raider M.D.   On: 12/09/2015 07:01   Dg Hip Unilat  With Pelvis 2-3 Views Right  12/09/2015  CLINICAL DATA:  Status post fall, with right hip pain. Initial encounter. EXAM: DG HIP (WITH OR WITHOUT PELVIS) 2-3V RIGHT COMPARISON:  None. FINDINGS: There are mildly displaced fractures of the right superior and inferior pubic rami. Extension of the right superior pubic ramus fracture to the acetabulum cannot be excluded. Both femoral heads are seated normally within their respective acetabula. The proximal right femur appears  intact. Degenerative change is noted along the lower lumbar spine. The sacroiliac joints are unremarkable in appearance. The visualized bowel gas pattern is grossly unremarkable in appearance. Scattered vascular calcifications are seen. IMPRESSION: Mildly displaced fractures of the right superior and inferior pubic rami. Extension of the right superior pubic ramus fracture to the acetabulum cannot be excluded. Electronically Signed   By: Roanna Raider M.D.   On: 12/09/2015 05:52    Scheduled Meds: . busPIRone  7.5 mg Oral BID  .  ceFAZolin (ANCEF) IV  2 g Intravenous to SS-Proc  . chlorhexidine  60 mL Topical Once  . citalopram  20 mg Oral Daily  . clindamycin (CLEOCIN) IV  600 mg Intravenous Q8H  . diltiazem  360 mg Oral Daily  . fluticasone  2 spray Each Nare Daily  . insulin aspart  0-9 Units Subcutaneous TID WC  . isosorbide mononitrate  30 mg Oral Daily  . loratadine  10 mg Oral Daily  . metoprolol  100 mg Oral BID  . pantoprazole  40 mg Oral Daily  . potassium chloride SA  20 mEq Oral Daily  . torsemide  40 mg Oral Daily   Continuous Infusions:     Time spent: 25 minutes    Josselin Gaulin  [facility] Pager [phone] If 7PM-7AM, please contact night-coverage at [email or web address], password [email or web address] 12/10/2015, 12:19 PM  LOS: 1 day

## 2015-12-10 NOTE — NC FL2 (Signed)
[place] MEDICAID FL2 LEVEL OF CARE SCREENING TOOL     IDENTIFICATION  Patient Name: [name] Birthdate: 09-Dec-1933 Sex: female Admission Date (Current Location): 12/09/2015  [facility] and [place] Number: [employer] and Address:  [facility] [place], [address] [place], [place] [address]      Provider Number: [id number]  Attending Physician Name and Address:  Eddie North, MD  Relative Name and Phone Number:       Current Level of Care: Hospital Recommended Level of Care: Skilled Nursing Facility Prior Approval Number:    Date Approved/Denied:   PASRR Number: [id number]  Discharge Plan: SNF    Current Diagnoses: Patient Active Problem List   Diagnosis Date Noted  . Olecranon fracture 12/09/2015  . CKD (chronic kidney disease), stage III 12/09/2015  . Depression 12/09/2015  . Pelvic fracture (HCC) 12/09/2015  . Bilateral lower extremity edema 12/09/2015  . Chronic anticoagulation - Coumadin, CHADS2VASC=6 11/13/2015  . Acute on chronic diastolic CHF (congestive heart failure) (HCC) 11/13/2015  . Generalized weakness 11/12/2015  . Cellulitis of right leg 11/12/2015  . Dehydration 11/12/2015  . Depression with anxiety 11/12/2015  . Leg wound, right 11/05/2015  . Diabetes mellitus without complication (HCC) 10/16/2015  . Hyperkalemia 10/13/2015  . Acute on chronic respiratory failure with hypoxia (HCC) 10/09/2015  . Chronic diastolic CHF (congestive heart failure) (HCC) 10/09/2015  . COPD (chronic obstructive pulmonary disease) (HCC) 09/17/2015  . Acute renal failure superimposed on stage 4 chronic kidney disease (HCC) 09/17/2015  . BMI 30.0-30.9,adult 09/16/2015  . Acute encephalopathy 09/16/2015  . Angina at rest [facility]) 07/29/2015  . Hypothyroidism 10/28/2014  . Gastroesophageal reflux disease without esophagitis 10/28/2014  . GAD (generalized anxiety disorder) 10/28/2014  . Osteoporosis 10/28/2014  . Essential  hypertension, benign 03/26/2013  . CAD (coronary artery disease), native coronary artery 03/26/2013  . Hyperlipidemia with target LDL less than 100 03/26/2013  . Atrial fibrillation (HCC) 03/26/2013  . Chronic low back pain 03/26/2013  . Occlusion and stenosis of carotid artery without mention of cerebral infarction 06/08/2012    Orientation RESPIRATION BLADDER Height & Weight    Self, Time, Situation, Place  O2 (4L [place]) Continent 5' (152.4 cm) 178 lbs.  BEHAVIORAL SYMPTOMS/MOOD NEUROLOGICAL BOWEL NUTRITION STATUS      Continent Diet (cardiac)  AMBULATORY STATUS COMMUNICATION OF NEEDS Skin   Extensive Assist Verbally Normal                       Personal Care Assistance Level of Assistance  Bathing, Dressing Bathing assistance: extensive   Dressing assistance: extensive      Functional Limitations Info             SPECIAL CARE FACTORS FREQUENCY  PT (By licensed PT), OT (By licensed OT)     PT Frequency: 5/wk OT Frequency: 5/wk            Contractures      Additional Factors Info  Code Status, Allergies, Insulin Sliding Scale, Psychotropic Code Status Info: DNR Allergies Info: NKA Psychotropic Info: buspar Insulin Sliding Scale Info: sliding scale- 3/day       Current Medications (12/10/2015):  This is the current hospital active medication list Current Facility-Administered Medications  Medication Dose Route Frequency Provider Last Rate Last Dose  . busPIRone (BUSPAR) tablet 7.5 mg  7.5 mg Oral BID Meredith Pel, NP   7.5 mg at 12/09/15 2126  . ceFAZolin (ANCEF) IVPB 2 g/50 mL premix  2 g Intravenous to SS-Proc Jodi GeraldsJohn Graves, MD      . chlorhexidine (HIBICLENS) 4 % liquid 4 application  60 mL Topical Once Jodi GeraldsJohn Graves, MD      . citalopram (CELEXA) tablet 20 mg  20 mg Oral Daily Meredith PelPaula M Guenther, NP   20 mg at 12/09/15 1015  . clindamycin (CLEOCIN) IVPB 600 mg  600 mg Intravenous Q8H Ozella Rocksavid J Merrell, MD   600 mg at 12/10/15 0418  . diltiazem (CARDIZEM  CD) 24 hr capsule 360 mg  360 mg Oral Daily Meredith PelPaula M Guenther, NP   360 mg at 12/09/15 1014  . fluticasone (FLONASE) 50 MCG/ACT nasal spray 2 spray  2 spray Each Nare Daily Meredith PelPaula M Guenther, NP   2 spray at 12/09/15 2128  . furosemide (LASIX) injection 40 mg  40 mg Intravenous Once Nishant Dhungel, MD      . HYDROcodone-acetaminophen (NORCO/VICODIN) 5-325 MG per tablet 1 tablet  1 tablet Oral BID PRN Meredith PelPaula M Guenther, NP   1 tablet at 12/09/15 1015  . HYDROmorphone (DILAUDID) injection 0.5 mg  0.5 mg Intravenous Q2H PRN Marshia LyJames Bethune, PA-C   0.5 mg at 12/09/15 2053  . insulin aspart (novoLOG) injection 0-9 Units  0-9 Units Subcutaneous TID WC Meredith PelPaula M Guenther, NP   1 Units at 12/10/15 0636  . ipratropium-albuterol (DUONEB) 0.5-2.5 (3) MG/3ML nebulizer solution 3 mL  3 mL Nebulization Q4H PRN Meredith PelPaula M Guenther, NP      . isosorbide mononitrate (IMDUR) 24 hr tablet 30 mg  30 mg Oral Daily Meredith PelPaula M Guenther, NP   30 mg at 12/09/15 1015  . loratadine (CLARITIN) tablet 10 mg  10 mg Oral Daily Meredith PelPaula M Guenther, NP   10 mg at 12/09/15 1807  . metoprolol (LOPRESSOR) tablet 100 mg  100 mg Oral BID Meredith PelPaula M Guenther, NP   100 mg at 12/09/15 2129  . nitroGLYCERIN (NITROSTAT) SL tablet 0.4 mg  0.4 mg Sublingual Q5 min PRN Meredith PelPaula M Guenther, NP      . ondansetron [facility](ZOFRAN) tablet 4 mg  4 mg Oral Q6H PRN Meredith PelPaula M Guenther, NP       Or  . ondansetron [facility](ZOFRAN) injection 4 mg  4 mg Intravenous Q6H PRN Meredith PelPaula M Guenther, NP      . oxyCODONE (Oxy IR/ROXICODONE) immediate release tablet 5-10 mg  5-10 mg Oral Q4H PRN Marshia LyJames Bethune, PA-C      . pantoprazole (PROTONIX) EC tablet 40 mg  40 mg Oral Daily Meredith PelPaula M Guenther, NP   40 mg at 12/09/15 1807  . potassium chloride SA (K-DUR,KLOR-CON) CR tablet 20 mEq  20 mEq Oral Daily Meredith PelPaula M Guenther, NP   20 mEq at 12/09/15 1807  . torsemide (DEMADEX) tablet 40 mg  40 mg Oral Daily Meredith PelPaula M Guenther, NP   40 mg at 12/09/15 1806     Discharge Medications: Please see discharge summary  for a list of discharge medications.  Relevant Imaging Results:  Relevant Lab Results:   Additional Information SS#: [id number]  Izora RibasHoloman, Karlon Schlafer M, [place]

## 2015-12-10 NOTE — Op Note (Signed)
NAME:  [name], [name]                ACCOUNT NO.:  [id number]  MEDICAL RECORD NO.:  [id number]  LOCATION:  [facility]                         FACILITY:  [facility]  PHYSICIAN:  Harvie JuniorJohn L. Valencia Kassa, M.D.   DATE OF BIRTH:  [birth year]  DATE OF PROCEDURE:  12/10/2015 DATE OF DISCHARGE:                              OPERATIVE REPORT   PREOPERATIVE DIAGNOSIS:  Displaced olecranon fracture, right.  POSTOPERATIVE DIAGNOSIS:  Displaced olecranon fracture, right.  PROCEDURES: 1. Open reduction and internal fixation of displaced olecranon     fracture, right. 2. Interpretation of multiple intraoperative fluoroscopic images.  SURGEON:  Harvie JuniorJohn L. Levander Katzenstein, M.D.  ASSISTANT:  Marshia LyJames Bethune, P.A.  ANESTHESIA:  General.  BRIEF HISTORY:  [name] is 81 year old female with long history of significant complaints of right elbow and right hip pain.  She had fallen and suffered a pelvic fracture with a right acetabular fracture as well as a right dramatically displaced olecranon fracture.  We talked about treatment options, but given her overall reasonable health, we felt that open reduction and internal fixation was appropriate.  She was brought to the operating room for this procedure.  In terms of her acetabular fracture, I discussed the case with Dr. Myrene GalasMichael Handy, a trauma expert and he felt that there was no appropriate indication for operative intervention for her pelvic and acetabular fractures.  She was brought to the operating room for elbow surgery.  DESCRIPTION OF PROCEDURE:  The patient was brought to the operating room.  After adequate anesthesia was obtained with general anesthetic, the patient was placed supine on the operating table.  The right arm was then prepped and draped in usual sterile fashion.  Following this, an incision was made for posterior approach to the elbow subcutaneous tissue down the level of the elbow area.  We then basically identified the fracture, cleansed it of healing  elements.  Did a manipulative closed reduction and then put the plate on.  We made a little rent in the triceps to allow the plate to get very close to bone and this allowed us to get great fixation into this area.  We used two dorsal taps to get better control of the posterior fragment.  Once this was done, we did a compressive hole in the oma and then slid the plate down very close to the olecranon and got an anatomic reduction and then put the two screws in the dorsal olecranon and then once this was done, we did the distal screws and then the home-run screw.  We got excellent compressive fixation distally and excellent fixation in the proximal fragment and an anatomic reduction.  At this point, the wound was irrigated, suctioned dry, closed in layers.  Sterile compressive dressing was applied. The patient was taken to the recovery room, noted be in satisfactory condition.  Estimated blood loss for the procedure was minimal.  During the case, multiple intraoperative fluoroscopic images were taken and interpreted to make sure that we had appropriate position of the plate.     Harvie JuniorJohn L. Jaimon Bugaj, M.D.     Ranae PlumberJLG/MEDQ  D:  12/10/2015  T:  12/10/2015  Job:  [id number]

## 2015-12-10 NOTE — Anesthesia Postprocedure Evaluation (Signed)
Anesthesia Post Note  Patient: [name]  Procedure(s) Performed: Procedure(s) (LRB): OPEN REDUCTION INTERNAL FIXATION (ORIF) ELBOW/OLECRANON FRACTURE (Right)  Patient location during evaluation: PACU Anesthesia Type: General Level of consciousness: awake and alert Pain management: pain level controlled Vital Signs Assessment: post-procedure vital signs reviewed and stable Respiratory status: spontaneous breathing, respiratory function stable and non-rebreather facemask Cardiovascular status: blood pressure returned to baseline Postop Assessment: no signs of nausea or vomiting Anesthetic complications: no    Last Vitals:  Filed Vitals:   12/10/15 2200 12/10/15 2215  BP: 127/80 139/100  Pulse: 103   Temp:    Resp: 20     Last Pain:  Filed Vitals:   12/10/15 2224  PainSc: 0-No pain                 Neven Fina S

## 2015-12-10 NOTE — Progress Notes (Signed)
Subjective: C/O PAIN R ELBOW AND R HIP  Objective: Vital signs in last 24 hours: Temp:  [97.8 F (36.6 C)-98.9 F (37.2 C)] 98.9 F (37.2 C) (12/14 0452) Pulse Rate:  [100-114] 107 (12/14 0452) Resp:  [16-21] 18 (12/14 0452) BP: (112-158)/(60-80) 144/68 mmHg (12/14 0452) SpO2:  [91 %-100 %] 93 % (12/14 0452) Weight:  [81.1 kg (178 lb 12.7 oz)-81.5 kg (179 lb 10.8 oz)] 81.1 kg (178 lb 12.7 oz) (12/14 0452)  Intake/Output from previous day: 12/13 0701 - 12/14 0700 In: 360 [P.O.:360] Out: -  Intake/Output this shift:     Recent Labs  12/09/15 0534 12/10/15 0349  HGB 10.9* 9.9*    Recent Labs  12/09/15 0534 12/10/15 0349  WBC 11.6* 6.1  RBC 3.71* 3.42*  HCT 35.7* 32.4*  PLT 194 170    Recent Labs  12/09/15 1205 12/10/15 0349  NA 138 135  K 4.3 4.1  CL 99* 96*  CO2 33* 30  BUN 21* 23*  CREATININE 1.56* 1.54*  GLUCOSE 148* 156*  CALCIUM 8.4* 8.5*    Recent Labs  12/09/15 0534 12/09/15 2135  INR 2.25* 2.01*    Neurologically intact ABD soft Neurovascular intact Sensation intact distally Intact pulses distally No cellulitis present Compartment soft  Assessment/Plan: R OLECRANON FRACTURE //PLAN ORIF TODAY r pelvic fracture  ttwb r side r acetabular fracture ttwb r side   Daurice Ovando L 12/10/2015, 8:03 AM

## 2015-12-10 NOTE — Progress Notes (Signed)
1145am    Unsuccessful attempt to place Foley cath,  Unable to hold legs open long enough, lots of screaming in pain when trying to wash labia and keep legs apart.  Yelling for us to stop this procedure,  Lowry RamKathryn Arshad Oberholzer,RN

## 2015-12-10 NOTE — Anesthesia Preprocedure Evaluation (Signed)
Anesthesia Evaluation  Patient identified by MRN, date of birth, ID band Patient awake    Reviewed: Allergy & Precautions, H&P , NPO status , Patient's Chart, lab work & pertinent test results  History of Anesthesia Complications Negative for: history of anesthetic complications  Airway Mallampati: II  TM Distance: >3 FB Neck ROM: full    Dental no notable dental hx.    Pulmonary COPD,  COPD inhaler, former smoker,    Pulmonary exam normal breath sounds clear to auscultation       Cardiovascular hypertension, + CAD, + Past MI, + Cardiac Stents (PCI in history) and +CHF  Normal cardiovascular exam Rhythm:regular Rate:Normal  August 2016 echo with significant diastolic dysfunction, EF preserved at 55%   Neuro/Psych Depression  Neuromuscular disease    GI/Hepatic Neg liver ROS, GERD  ,  Endo/Other  diabetesHypothyroidism   Renal/GU CRFRenal disease     Musculoskeletal   Abdominal   Peds  Hematology negative hematology ROS (+)   Anesthesia Other Findings   Reproductive/Obstetrics negative OB ROS                             Anesthesia Physical Anesthesia Plan  ASA: III  Anesthesia Plan: General   Post-op Pain Management:    Induction: Intravenous  Airway Management Planned: LMA  Additional Equipment:   Intra-op Plan:   Post-operative Plan: Extubation in OR  Informed Consent: I have reviewed the patients History and Physical, chart, labs and discussed the procedure including the risks, benefits and alternatives for the proposed anesthesia with the patient or authorized representative who has indicated his/her understanding and acceptance.   Dental Advisory Given  Plan Discussed with: Anesthesiologist, CRNA and Surgeon  Anesthesia Plan Comments:         Anesthesia Quick Evaluation

## 2015-12-10 NOTE — Progress Notes (Signed)
Patient with significant COPD and home O2 use unable to maintain adequate oxygen saturation on [place]. Will try venturi mask and switch to a stepdown unit.

## 2015-12-10 NOTE — Clinical Social Work Note (Signed)
Clinical Social Work Assessment  Patient Details  Name: Katie FergusonDessie A Trimm MRN: [id number] Date of Birth: 1933-07-28  Date of referral:  12/10/15               Reason for consult:  Facility Placement                Permission sought to share information with:  Facility Medical sales representativeContact Representative, Family Supports Permission granted to share information::  Yes, Verbal Permission Granted  Name::     Leonor LivMarty, [name]  Agency::  SNF  Relationship::  nephew and his wife  Contact Information:     Housing/Transportation Living arrangements for the past 2 months:  Skilled Building surveyorursing Facility Source of Information:  Patient, Other (Comment Required) (nephew/niece) Patient Interpreter Needed:  None Criminal Activity/Legal Involvement Pertinent to Current Situation/Hospitalization:  No - Comment as needed Significant Relationships:  Other Family Members Lives with:  Self Do you feel safe going back to the place where you live?  No Need for family participation in patient care:  Yes (Comment) (ADLs)  Care giving concerns:  Pt lives at home alone but family lives across the street and is able to help throughout the day- unsure if they can take pt home [facility]   Social Worker assessment / plan:  CSW spoke with pt and pt nephew concerning plan for time of DC.  Employment status:  Retired Database administratornsurance information:  Managed Medicare PT Recommendations:  Not assessed at this time Information / Referral to community resources:  Skilled Nursing Facility  Patient/Family's Response to care:  Pt nephew states pt was about to DC from SNF on this Friday but she fell at the facility- is very irritated with SNF for care factors that he believes led to the fall- also feels as if he was forced to place the pt in SNF in the first place by home health staff.  Nephew and pt are unsure about going to SNF again- both agree they would want a different facility if the pt was unsafe to return  home.  Pt niece is very interested in looking into home health options and if it would be possible to take pt home with special equipment- RNCM consulted to discuss options.  Everyone in agreement to start search to get options and make a decision from there.  Patient/Family's Understanding of and Emotional Response to Diagnosis, Current Treatment, and Prognosis:  No questions or concerns at this time.  Emotional Assessment Appearance:  Appears stated age Attitude/Demeanor/Rapport:    Affect (typically observed):  Appropriate, Apprehensive Orientation:  Oriented to Self, Oriented to Place, Oriented to  Time, Oriented to Situation Alcohol / Substance use:  Not Applicable Psych involvement (Current and /or in the community):  No (Comment)  Discharge Needs  Concerns to be addressed:  Home Safety Concerns, Care Coordination Readmission within the last 30 days:  No Current discharge risk:  Physical Impairment Barriers to Discharge:  Continued Medical Work up   Peabody EnergyHoloman, Elon Lomeli M, LCSW 12/10/2015, 1:19 PM

## 2015-12-10 NOTE — Clinical Social Work Placement (Signed)
   CLINICAL SOCIAL WORK PLACEMENT  NOTE  Date:  12/10/2015  Patient Details  Name: [name] MRN: [id number] Date of Birth: 1933-10-06  Clinical Social Work is seeking post-discharge placement for this patient at the Skilled  Nursing Facility level of care (*CSW will initial, date and re-position this form in  chart as items are completed):  Yes   Patient/family provided with [facility] Clinical Social Work Department's list of facilities offering this level of care within the geographic area requested by the patient (or if unable, by the patient's family).  Yes   Patient/family informed of their freedom to choose among providers that offer the needed level of care, that participate in Medicare, Medicaid or managed care program needed by the patient, have an available bed and are willing to accept the patient.  Yes   Patient/family informed of [facility]'s ownership interest in [facility] and [facility], as well as of the fact that they are under no obligation to receive care at these facilities.  PASRR submitted to EDS on       PASRR number received on       Existing PASRR number confirmed on 12/10/15     FL2 transmitted to all facilities in geographic area requested by pt/family on 12/10/15     FL2 transmitted to all facilities within larger geographic area on       Patient informed that his/her managed care company has contracts with or will negotiate with certain facilities, including the following:            Patient/family informed of bed offers received.  Patient chooses bed at       Physician recommends and patient chooses bed at      Patient to be transferred to   on  .  Patient to be transferred to facility by       Patient family notified on   of transfer.  Name of family member notified:        PHYSICIAN Please sign FL2, Please sign DNR     Additional Comment:    _______________________________________________ Izora RibasHoloman, Katie Mould Melendez,  [name] 12/10/2015, 1:28 PM

## 2015-12-10 NOTE — Transfer of Care (Signed)
Immediate Anesthesia Transfer of Care Note  Patient: [name]  Procedure(s) Performed: Procedure(s): OPEN REDUCTION INTERNAL FIXATION (ORIF) ELBOW/OLECRANON FRACTURE (Right)  Patient Location: PACU  Anesthesia Type:General  Level of Consciousness: awake and alert   Airway & Oxygen Therapy: Patient Spontanous Breathing and Patient connected to face mask oxygen  Post-op Assessment: Report given to RN and Post -op Vital signs reviewed and stable  Post vital signs: Reviewed and stable  Last Vitals:  Filed Vitals:   12/10/15 0452 12/10/15 1407  BP: 144/68 142/78  Pulse: 107 112  Temp: 37.2 C 36.7 C  Resp: 18 19    Complications: No apparent anesthesia complications

## 2015-12-10 NOTE — Brief Op Note (Signed)
12/09/2015 - 12/10/2015  5:47 PM  PATIENT:  [name]  81 y.o. female  PRE-OPERATIVE DIAGNOSIS:  Right Elbow Fx  POST-OPERATIVE DIAGNOSIS:  Right Elbow Fx  PROCEDURE:  Procedure(s): OPEN REDUCTION INTERNAL FIXATION (ORIF) ELBOW/OLECRANON FRACTURE (Right)  SURGEON:  Surgeon(s) and Role:    * Jodi GeraldsJohn Khyra Viscuso, MD - Primary  PHYSICIAN ASSISTANT:   ASSISTANTS: bethune   ANESTHESIA:   general  EBL:     BLOOD ADMINISTERED:none  DRAINS: none   LOCAL MEDICATIONS USED:  NONE  SPECIMEN:  No Specimen  DISPOSITION OF SPECIMEN:  N/A  COUNTS:  YES  TOURNIQUET:  * Missing tourniquet times found for documented tourniquets in log:  [id number] *  DICTATION: .Other Dictation: Dictation Number [id number]  PLAN OF CARE: Admit to inpatient   PATIENT DISPOSITION:  PACU - hemodynamically stable.   Delay start of Pharmacological VTE agent (>24hrs) due to surgical blood loss or risk of bleeding: no

## 2015-12-10 NOTE — Progress Notes (Signed)
Nephew notified orthopedic surgery is scheduled this afternoon 1728 [5:30 PM], recommended to be here by 3:00 PM. Stated he and family members are leaving shortly for the hospital.

## 2015-12-10 NOTE — Anesthesia Procedure Notes (Signed)
Procedure Name: LMA Insertion Date/Time: 12/10/2015 4:41 PM Performed by: Sarita HaverFLOWERS, Justinian Miano T Pre-anesthesia Checklist: Patient identified, Suction available, Patient being monitored, Emergency Drugs available and Timeout performed Patient Re-evaluated:Patient Re-evaluated prior to inductionOxygen Delivery Method: Circle system utilized and Simple face mask Preoxygenation: Pre-oxygenation with 100% oxygen Intubation Type: IV induction Ventilation: Mask ventilation without difficulty LMA: LMA inserted LMA Size: 4.0 Number of attempts: 1 Airway Equipment and Method: Patient positioned with wedge pillow Placement Confirmation: positive ETCO2 and breath sounds checked- equal and bilateral Tube secured with: Tape Dental Injury: Teeth and Oropharynx as per pre-operative assessment

## 2015-12-11 ENCOUNTER — Observation Stay (HOSPITAL_COMMUNITY): Payer: Medicare Other

## 2015-12-11 ENCOUNTER — Encounter (HOSPITAL_COMMUNITY): Payer: Self-pay | Admitting: Orthopedic Surgery

## 2015-12-11 DIAGNOSIS — N183 Chronic kidney disease, stage 3 (moderate): Secondary | ICD-10-CM | POA: Diagnosis not present

## 2015-12-11 DIAGNOSIS — L03116 Cellulitis of left lower limb: Secondary | ICD-10-CM | POA: Diagnosis present

## 2015-12-11 DIAGNOSIS — S32301D Unspecified fracture of right ilium, subsequent encounter for fracture with routine healing: Secondary | ICD-10-CM | POA: Diagnosis not present

## 2015-12-11 DIAGNOSIS — Z7983 Long term (current) use of bisphosphonates: Secondary | ICD-10-CM | POA: Diagnosis not present

## 2015-12-11 DIAGNOSIS — I6529 Occlusion and stenosis of unspecified carotid artery: Secondary | ICD-10-CM | POA: Diagnosis present

## 2015-12-11 DIAGNOSIS — H269 Unspecified cataract: Secondary | ICD-10-CM | POA: Diagnosis present

## 2015-12-11 DIAGNOSIS — Z87891 Personal history of nicotine dependence: Secondary | ICD-10-CM | POA: Diagnosis not present

## 2015-12-11 DIAGNOSIS — Z79899 Other long term (current) drug therapy: Secondary | ICD-10-CM | POA: Diagnosis not present

## 2015-12-11 DIAGNOSIS — Z66 Do not resuscitate: Secondary | ICD-10-CM | POA: Diagnosis present

## 2015-12-11 DIAGNOSIS — Y95 Nosocomial condition: Secondary | ICD-10-CM | POA: Diagnosis present

## 2015-12-11 DIAGNOSIS — R0902 Hypoxemia: Secondary | ICD-10-CM | POA: Diagnosis not present

## 2015-12-11 DIAGNOSIS — E1122 Type 2 diabetes mellitus with diabetic chronic kidney disease: Secondary | ICD-10-CM | POA: Diagnosis present

## 2015-12-11 DIAGNOSIS — S32401A Unspecified fracture of right acetabulum, initial encounter for closed fracture: Secondary | ICD-10-CM | POA: Diagnosis present

## 2015-12-11 DIAGNOSIS — F329 Major depressive disorder, single episode, unspecified: Secondary | ICD-10-CM | POA: Diagnosis present

## 2015-12-11 DIAGNOSIS — R509 Fever, unspecified: Secondary | ICD-10-CM | POA: Diagnosis not present

## 2015-12-11 DIAGNOSIS — J449 Chronic obstructive pulmonary disease, unspecified: Secondary | ICD-10-CM | POA: Diagnosis present

## 2015-12-11 DIAGNOSIS — E860 Dehydration: Secondary | ICD-10-CM | POA: Diagnosis not present

## 2015-12-11 DIAGNOSIS — M8580 Other specified disorders of bone density and structure, unspecified site: Secondary | ICD-10-CM | POA: Diagnosis present

## 2015-12-11 DIAGNOSIS — K219 Gastro-esophageal reflux disease without esophagitis: Secondary | ICD-10-CM | POA: Diagnosis present

## 2015-12-11 DIAGNOSIS — J9621 Acute and chronic respiratory failure with hypoxia: Secondary | ICD-10-CM | POA: Diagnosis not present

## 2015-12-11 DIAGNOSIS — R000 Tachycardia, unspecified: Secondary | ICD-10-CM | POA: Diagnosis not present

## 2015-12-11 DIAGNOSIS — I252 Old myocardial infarction: Secondary | ICD-10-CM | POA: Diagnosis not present

## 2015-12-11 DIAGNOSIS — J189 Pneumonia, unspecified organism: Secondary | ICD-10-CM | POA: Diagnosis not present

## 2015-12-11 DIAGNOSIS — Z7901 Long term (current) use of anticoagulants: Secondary | ICD-10-CM | POA: Diagnosis not present

## 2015-12-11 DIAGNOSIS — I130 Hypertensive heart and chronic kidney disease with heart failure and stage 1 through stage 4 chronic kidney disease, or unspecified chronic kidney disease: Secondary | ICD-10-CM | POA: Diagnosis present

## 2015-12-11 DIAGNOSIS — M810 Age-related osteoporosis without current pathological fracture: Secondary | ICD-10-CM | POA: Diagnosis present

## 2015-12-11 DIAGNOSIS — J440 Chronic obstructive pulmonary disease with acute lower respiratory infection: Secondary | ICD-10-CM | POA: Diagnosis present

## 2015-12-11 DIAGNOSIS — W1830XA Fall on same level, unspecified, initial encounter: Secondary | ICD-10-CM | POA: Diagnosis present

## 2015-12-11 DIAGNOSIS — Z955 Presence of coronary angioplasty implant and graft: Secondary | ICD-10-CM | POA: Diagnosis not present

## 2015-12-11 DIAGNOSIS — S52021D Displaced fracture of olecranon process without intraarticular extension of right ulna, subsequent encounter for closed fracture with routine healing: Secondary | ICD-10-CM | POA: Diagnosis not present

## 2015-12-11 DIAGNOSIS — Z7189 Other specified counseling: Secondary | ICD-10-CM | POA: Diagnosis not present

## 2015-12-11 DIAGNOSIS — I2510 Atherosclerotic heart disease of native coronary artery without angina pectoris: Secondary | ICD-10-CM | POA: Diagnosis present

## 2015-12-11 DIAGNOSIS — Z515 Encounter for palliative care: Secondary | ICD-10-CM | POA: Diagnosis not present

## 2015-12-11 DIAGNOSIS — R600 Localized edema: Secondary | ICD-10-CM | POA: Diagnosis not present

## 2015-12-11 DIAGNOSIS — M1990 Unspecified osteoarthritis, unspecified site: Secondary | ICD-10-CM | POA: Diagnosis present

## 2015-12-11 DIAGNOSIS — S52021A Displaced fracture of olecranon process without intraarticular extension of right ulna, initial encounter for closed fracture: Secondary | ICD-10-CM | POA: Diagnosis present

## 2015-12-11 DIAGNOSIS — R0602 Shortness of breath: Secondary | ICD-10-CM | POA: Diagnosis not present

## 2015-12-11 DIAGNOSIS — I480 Paroxysmal atrial fibrillation: Secondary | ICD-10-CM | POA: Diagnosis not present

## 2015-12-11 DIAGNOSIS — I5031 Acute diastolic (congestive) heart failure: Secondary | ICD-10-CM | POA: Diagnosis present

## 2015-12-11 DIAGNOSIS — E785 Hyperlipidemia, unspecified: Secondary | ICD-10-CM | POA: Diagnosis present

## 2015-12-11 DIAGNOSIS — Z9981 Dependence on supplemental oxygen: Secondary | ICD-10-CM | POA: Diagnosis not present

## 2015-12-11 DIAGNOSIS — T501X5A Adverse effect of loop [high-ceiling] diuretics, initial encounter: Secondary | ICD-10-CM | POA: Diagnosis not present

## 2015-12-11 DIAGNOSIS — J441 Chronic obstructive pulmonary disease with (acute) exacerbation: Secondary | ICD-10-CM | POA: Diagnosis present

## 2015-12-11 DIAGNOSIS — R358 Other polyuria: Secondary | ICD-10-CM | POA: Diagnosis present

## 2015-12-11 DIAGNOSIS — Z7984 Long term (current) use of oral hypoglycemic drugs: Secondary | ICD-10-CM | POA: Diagnosis not present

## 2015-12-11 DIAGNOSIS — M545 Low back pain: Secondary | ICD-10-CM | POA: Diagnosis present

## 2015-12-11 DIAGNOSIS — N179 Acute kidney failure, unspecified: Secondary | ICD-10-CM | POA: Diagnosis not present

## 2015-12-11 DIAGNOSIS — R102 Pelvic and perineal pain: Secondary | ICD-10-CM | POA: Diagnosis not present

## 2015-12-11 LAB — GLUCOSE, CAPILLARY
Glucose-Capillary: 86 mg/dL (ref 65–99)
Glucose-Capillary: 116 mg/dL — ABNORMAL HIGH (ref 65–99)
Glucose-Capillary: 149 mg/dL — ABNORMAL HIGH (ref 65–99)
Glucose-Capillary: 137 mg/dL — ABNORMAL HIGH (ref 65–99)
Glucose-Capillary: 101 mg/dL — ABNORMAL HIGH (ref 65–99)

## 2015-12-11 LAB — BASIC METABOLIC PANEL
Anion gap: 7 (ref 5–15)
Anion gap: 11 (ref 5–15)
BUN: 27 mg/dL — ABNORMAL HIGH (ref 6–20)
BUN: 26 mg/dL — ABNORMAL HIGH (ref 6–20)
CO2: 33 mmol/L — ABNORMAL HIGH (ref 22–32)
CO2: 31 mmol/L (ref 22–32)
Calcium: 8 mg/dL — ABNORMAL LOW (ref 8.9–10.3)
Calcium: 8.2 mg/dL — ABNORMAL LOW (ref 8.9–10.3)
Chloride: 99 mmol/L — ABNORMAL LOW (ref 101–111)
Chloride: 98 mmol/L — ABNORMAL LOW (ref 101–111)
Creatinine, Ser: 1.64 mg/dL — ABNORMAL HIGH (ref 0.44–1.00)
Creatinine, Ser: 1.53 mg/dL — ABNORMAL HIGH (ref 0.44–1.00)
GFR calc Af Amer: 33 mL/min — ABNORMAL LOW (ref >60)
GFR calc Af Amer: 36 mL/min — ABNORMAL LOW (ref >60)
GFR calc non Af Amer: 28 mL/min — ABNORMAL LOW (ref >60)
GFR calc non Af Amer: 31 mL/min — ABNORMAL LOW (ref >60)
Glucose, Bld: 104 mg/dL — ABNORMAL HIGH (ref 65–99)
Glucose, Bld: 81 mg/dL (ref 65–99)
Potassium: 4.7 mmol/L (ref 3.5–5.1)
Potassium: 4.7 mmol/L (ref 3.5–5.1)
Sodium: 139 mmol/L (ref 135–145)
Sodium: 140 mmol/L (ref 135–145)

## 2015-12-11 LAB — CBC
HCT: 29.2 % — ABNORMAL LOW (ref 36.0–46.0)
HCT: 30 % — ABNORMAL LOW (ref 36.0–46.0)
Hemoglobin: 8.9 g/dL — ABNORMAL LOW (ref 12.0–15.0)
Hemoglobin: 9.3 g/dL — ABNORMAL LOW (ref 12.0–15.0)
MCH: 29 pg (ref 26.0–34.0)
MCH: 29.3 pg (ref 26.0–34.0)
MCHC: 30.5 g/dL (ref 30.0–36.0)
MCHC: 31 g/dL (ref 30.0–36.0)
MCV: 95.1 fL (ref 78.0–100.0)
MCV: 94.6 fL (ref 78.0–100.0)
Platelets: 145 10*3/uL — ABNORMAL LOW (ref 150–400)
Platelets: 155 10*3/uL (ref 150–400)
RBC: 3.07 MIL/uL — ABNORMAL LOW (ref 3.87–5.11)
RBC: 3.17 MIL/uL — ABNORMAL LOW (ref 3.87–5.11)
RDW: 15.6 % — ABNORMAL HIGH (ref 11.5–15.5)
RDW: 15.7 % — ABNORMAL HIGH (ref 11.5–15.5)
WBC: 9.1 10*3/uL (ref 4.0–10.5)
WBC: 8.7 10*3/uL (ref 4.0–10.5)

## 2015-12-11 LAB — BRAIN NATRIURETIC PEPTIDE: B Natriuretic Peptide: 739.1 pg/mL — ABNORMAL HIGH (ref 0.0–100.0)

## 2015-12-11 LAB — BLOOD GAS, ARTERIAL
Acid-Base Excess: 6.7 mmol/L — ABNORMAL HIGH (ref 0.0–2.0)
Bicarbonate: 31.6 mEq/L — ABNORMAL HIGH (ref 20.0–24.0)
Drawn by: 44956
FIO2: 0.5
O2 Saturation: 88 %
Patient temperature: 98.7
TCO2: 33.2 mmol/L (ref 0–100)
pCO2 arterial: 53.1 mmHg — ABNORMAL HIGH (ref 35.0–45.0)
pH, Arterial: 7.392 (ref 7.350–7.450)
pO2, Arterial: 58.3 mmHg — ABNORMAL LOW (ref 80.0–100.0)

## 2015-12-11 LAB — D-DIMER, QUANTITATIVE: D-Dimer, Quant: 2.04 ug/mL-FEU — ABNORMAL HIGH (ref 0.00–0.50)

## 2015-12-11 LAB — PROTIME-INR
INR: 1.32 (ref 0.00–1.49)
Prothrombin Time: 16.6 seconds — ABNORMAL HIGH (ref 11.6–15.2)

## 2015-12-11 LAB — TROPONIN I
Troponin I: 0.04 ng/mL — ABNORMAL HIGH (ref <0.031)
Troponin I: 0.05 ng/mL — ABNORMAL HIGH (ref <0.031)
Troponin I: 0.03 ng/mL (ref <0.031)

## 2015-12-11 MED ORDER — LEVALBUTEROL HCL 1.25 MG/0.5ML IN NEBU
1.2500 mg | INHALATION_SOLUTION | Freq: Four times a day (QID) | RESPIRATORY_TRACT | Status: DC
  Administered 2015-12-11 – 2015-12-13 (×7): 1.25 mg via RESPIRATORY_TRACT
  Filled 2015-12-11 (×7): qty 0.5

## 2015-12-11 MED ORDER — WARFARIN SODIUM 5 MG PO TABS
5.0000 mg | ORAL_TABLET | Freq: Once | ORAL | Status: AC
  Administered 2015-12-11: 5 mg via ORAL
  Filled 2015-12-11: qty 1

## 2015-12-11 MED ORDER — FUROSEMIDE 10 MG/ML IJ SOLN
40.0000 mg | Freq: Once | INTRAMUSCULAR | Status: AC
  Administered 2015-12-11: 40 mg via INTRAVENOUS
  Filled 2015-12-11: qty 4

## 2015-12-11 MED ORDER — FUROSEMIDE 10 MG/ML IJ SOLN
40.0000 mg | Freq: Two times a day (BID) | INTRAMUSCULAR | Status: DC
  Administered 2015-12-11 – 2015-12-12 (×2): 40 mg via INTRAVENOUS
  Filled 2015-12-11: qty 4

## 2015-12-11 MED ORDER — FUROSEMIDE 10 MG/ML IJ SOLN
60.0000 mg | Freq: Once | INTRAMUSCULAR | Status: AC
  Administered 2015-12-11: 60 mg via INTRAVENOUS
  Filled 2015-12-11: qty 6

## 2015-12-11 MED ORDER — LEVALBUTEROL HCL 1.25 MG/0.5ML IN NEBU: 1.2500 mg | INHALATION_SOLUTION | Freq: Four times a day (QID) | RESPIRATORY_TRACT | Status: DC

## 2015-12-11 MED ORDER — WARFARIN - PHARMACIST DOSING INPATIENT
Freq: Every day | Status: DC
  Administered 2015-12-11 – 2015-12-15 (×4)

## 2015-12-11 NOTE — Progress Notes (Signed)
Orthopedic Tech Progress Note Patient Details:  Katie FergusonDessie A Melendez Jun 02, 1933 [id number]  Patient ID: Katie Fergusonessie A Melendez, female   DOB: Jun 02, 1933, 81 y.o.   MRN: [id number] Viewed order from doctor's order list  Nikki DomCrawford, Fifi Schindler 12/11/2015, 8:59 AM

## 2015-12-11 NOTE — Progress Notes (Signed)
ANTICOAGULATION CONSULT NOTE - Initial Consult  Pharmacy Consult for warfarin Indication: atrial fibrillation  No Known Allergies  Patient Measurements: Height: 5' (152.4 cm) Weight: 183 lb 13.8 oz (83.4 kg) IBW/kg (Calculated) : 45.5 Heparin Dosing Weight:   Vital Signs: Temp: 98 F (36.7 C) (12/15 1146) Temp Source: Oral (12/15 1146) BP: 101/68 mmHg (12/15 1146) Pulse Rate: 97 (12/15 0345)  Labs:  Recent Labs  12/09/15 2135 12/10/15 0349 12/10/15 0945 12/11/15 0124 12/11/15 0818 12/11/15 1250  HGB  --  9.9*  --  8.9* 9.3*  --   HCT  --  32.4*  --  29.2* 30.0*  --   PLT  --  170  --  145* 155  --   LABPROT 22.6*  --  18.6*  --  16.6*  --   INR 2.01*  --  1.54*  --  1.32  --   CREATININE  --  1.54*  --  1.64* 1.53*  --   TROPONINI  --   --   --  0.04* 0.05* 0.03    Estimated Creatinine Clearance: 28.1 mL/min (by C-G formula based on Cr of 1.53).   Medical History: Past Medical History  Diagnosis Date  . Hypertension   . CAD (coronary artery disease) 5/96  . Edema   . Hyperlipidemia   . Chronic atrial fibrillation (HCC)     Coumadin therapy   . Osteopenia   . Renal insufficiency   . Closed right ankle fracture   . CHF (congestive heart failure) (HCC)   . LBP (low back pain)   . Arthritis   . Cataract   . COPD (chronic obstructive pulmonary disease) (HCC)     wears O2 at home as needed  . Esophageal stricture   . Esophageal dysmotility 09/2011    seen on barium esophagram.  . Carotid artery occlusion   . Myocardial infarction [facility](HCC)     Medications:  See EMR  Assessment: Pt fell POD #1 ORIF of R olecranon fracture. Pt was on warfarin PTA for AFib. INR 1.3.  PTA warfarin: 5 mg qTues/Thurs, 2.5 mg all other days  Goal of Therapy:  INR 2-3 Monitor platelets by anticoagulation protocol: Yes   Plan:  -Warfarin 5 mg po x1 -Daily INR -Monitor s/sx   Baldemar FridayMasters, Shunna Mikaelian M 12/11/2015,2:57 PM

## 2015-12-11 NOTE — Progress Notes (Signed)
[facility] PROGRESS NOTE  KENYIA WAMBOLT ZOX:[id number] DOB: [birth year] DOA: 12/09/2015 PCP: Bennie Pierini, [name]  brief narrative 81 year old female with history of coronary artery disease (remote PCI), COPD on home O2, chronic kidney disease, A. fib on Coumadin resumed of skilled nursing facility presented to the ED after sustaining a fall and fractured her right elbow and sustained a mildly displaced fracture of right superior inferior pubic rami. Patient admitted to hospitalist service and orthopedic surgeon consulted.  Assessment/Plan: Right olecranon fracture ORIF on 12/14. Transfer to stepdown unit given shortness of breath. Pain control as needed. Orthopedics ordered new fiberglass splint. Start physical therapy when patient able. Recommend partial weightbearing (50%) on both the right upper and lower extremities. Patient will need to return to skilled nursing facility upon discharge.  Right pelvic and acetabular fracture Recommend partial weightbearing (50%) on right side.  Acute on chronic hypoxic respiratory failure Patient is on home O2 (2 L). Was wheezing on admission. Also showed mild vascular congestion on chest x-ray with elevated BNP (200). Last echo fro she was wheezy on admission with chest x-ray showing mild vascular congestion and elevated BNP. Last echo from 07/2015 shows here for 50-50% with grade 3 diastolic dysfunction.. -Patient had worsening shortness of breath postop and transfer to stepdown unit. Follow up chest x-ray showed pulmonary vascular congestion and basilar edema. Patient was receiving when necessary IV Lasix and home dose torsemide. I have ordered 60 mg IV Lasix this morning followed by 40 mg IV Lasix twice daily. Monitor strict I/O. Discontinue torsemide while in the hospital. Replenish potassium. Continue when necessary nebs. -Patient currently requiring 5 L O2 via nasal cannula. Continue to monitoring stepdown unit.   Paroxysmal A.  fib Coumadin held on admission and given vitamin K. Resume warfarin today. Continue metoprolol and Cardizem.  Left lower leg cellulitis On empiric IV clindamycin.  CKD stage III Stable.  GERD Continue PPI  Type 2 diabetes mellitus Monitor with sliding scale coverage.  Depression Stable. Continue home medications.   DVT prophylaxis: Coumadin  Diet: Diabetic  Code Status: DNR Family Communication: None at bedside Disposition Plan: return to SNF possibly early next week once respiratory symptoms improved and participating with PT   Consultants:  Orthopedics (Dr. Luiz Blare)  Procedures:  ORIF of right elbow on 12/14  Antibiotics:  Clindamycin since 12/13  HPI/Subjective: Seen and examined. Had ORIF of right elbow on 12/14. Was hypoxemic postop and transfer to stepdown unit.  Objective: Filed Vitals:   12/11/15 0800 12/11/15 1146  BP: 130/76 101/68  Pulse:    Temp: 97.7 F (36.5 C) 98 F (36.7 C)  Resp:  19    Intake/Output Summary (Last 24 hours) at 12/11/15 1431 Last data filed at 12/11/15 1200  Gross per 24 hour  Intake   1220 ml  Output   1375 ml  Net   -155 ml   Filed Weights   12/09/15 0938 12/10/15 0452 12/11/15 0343  Weight: 81.5 kg (179 lb 10.8 oz) 81.1 kg (178 lb 12.7 oz) 83.4 kg (183 lb 13.8 oz)    Exam:   General:  not in distress  HEENT:  moist oral mucosa, supple neck  Chest: bibasilar rhonchi and crackeles  CVS: S1 and S2 irregular, no murmurs rub or gallop  GI: Soft, nondistended, nontender, bowel sounds present,   Musculoskeletal: Limited mobility of the right hip, dressing  Over right elbow, 1+ pitting edema bilaterally  CNS: Alert and oriented  Data Reviewed: Basic Metabolic Panel:  Recent Labs Lab  12/09/15 0534 12/09/15 1205 12/10/15 0349 12/11/15 0124 12/11/15 0818  NA 138 138 135 139 140  K 4.1 4.3 4.1 4.7 4.7  CL 97* 99* 96* 99* 98*  CO2 35* 33* 30 33* 31  GLUCOSE 126* 148* 156* 104* 81  BUN 20 21* 23* 27*  26*  CREATININE 1.58* 1.56* 1.54* 1.64* 1.53*  CALCIUM 8.7* 8.4* 8.5* 8.0* 8.2*   Liver Function Tests: No results for input(s): AST, ALT, ALKPHOS, BILITOT, PROT, ALBUMIN in the last 168 hours. No results for input(s): LIPASE, AMYLASE in the last 168 hours. No results for input(s): AMMONIA in the last 168 hours. CBC:  Recent Labs Lab 12/09/15 0534 12/10/15 0349 12/11/15 0124 12/11/15 0818  WBC 11.6* 6.1 9.1 8.7  NEUTROABS 10.1*  --   --   --   HGB 10.9* 9.9* 8.9* 9.3*  HCT 35.7* 32.4* 29.2* 30.0*  MCV 96.2 94.7 95.1 94.6  PLT 194 170 145* 155   Cardiac Enzymes:  Recent Labs Lab 12/11/15 0124 12/11/15 0818 12/11/15 1250  TROPONINI 0.04* 0.05* 0.03   BNP (last 3 results)  Recent Labs  11/13/15 0845 12/09/15 1205 12/11/15 0124  BNP 882.8* 1201.2* 739.1*    ProBNP (last 3 results) No results for input(s): PROBNP in the last 8760 hours.  CBG:  Recent Labs Lab 12/10/15 1136 12/10/15 1520 12/11/15 0013 12/11/15 0804 12/11/15 1151  GLUCAP 133* 101* 116* 86 149*    Recent Results (from the past 240 hour(s))  Surgical pcr screen     Status: None   Collection Time: 12/10/15  3:15 PM  Result Value Ref Range Status   MRSA, PCR NEGATIVE NEGATIVE Final   Staphylococcus aureus NEGATIVE NEGATIVE Final    Comment:        The Xpert SA Assay (FDA approved for NASAL specimens in patients over 41 years of age), is one component of a comprehensive surveillance program.  Test performance has been validated by [facility] for patients greater than or equal to 73 year old. It is not intended to diagnose infection nor to guide or monitor treatment.      Studies: Dg Chest Port 1 View  12/11/2015  CLINICAL DATA:  Hypoxia.  Patient is on oxygen. EXAM: PORTABLE CHEST 1 VIEW COMPARISON:  12/09/2015 FINDINGS: Shallow inspiration. Cardiac enlargement with mild pulmonary vascular congestion. Hazy perihilar and basilar infiltrates suggest early edema. No focal  consolidation. Blunting of the costophrenic angles suggests small effusions. Calcified and tortuous aorta. No pneumothorax. IMPRESSION: Cardiac enlargement with mild pulmonary vascular congestion and basilar edema. Probable small bilateral pleural effusions. Electronically Signed   By: Burman Nieves M.D.   On: 12/11/2015 01:33    Scheduled Meds: . busPIRone  7.5 mg Oral BID  . citalopram  20 mg Oral Daily  . clindamycin (CLEOCIN) IV  600 mg Intravenous Q8H  . diltiazem  360 mg Oral Daily  . fluticasone  2 spray Each Nare Daily  . furosemide  40 mg Intravenous BID  . insulin aspart  0-9 Units Subcutaneous TID WC  . isosorbide mononitrate  30 mg Oral Daily  . levalbuterol  1.25 mg Nebulization Q6H  . loratadine  10 mg Oral Daily  . metoprolol  100 mg Oral BID  . pantoprazole  40 mg Oral Daily  . potassium chloride SA  20 mEq Oral Daily   Continuous Infusions:     Time spent: 35 minutes    Eduardo Wurth  [facility] Pager [phone] If 7PM-7AM, please contact night-coverage at [email or web address],  password [email or web address] 12/11/2015, 2:31 PM  LOS: 2 days

## 2015-12-11 NOTE — Evaluation (Signed)
Physical Therapy Evaluation Patient Details Name: PASCALE MAVES MRN: [id number] DOB: Nov 18, 1933 Today's Date: 12/11/2015   History of Present Illness  pt presents with fall sustaining R Olecranon fx s/p ORIF and R pelvic/acetabular fxs.  pt with hx of COPD, A-fib, DM, Depression, HTN, CAD, CKD, CHF, MI, and Esophageal Dysmotility.    Clinical Impression  Pt very limited with mobility and requires extensive A for any OOB mobility.  Feel pt will need SNF level of care at D/C to decrease burden of care and increase safety with mobility.  Will continue to follow while on acute.      Follow Up Recommendations SNF    Equipment Recommendations  None recommended by PT    Recommendations for Other Services       Precautions / Restrictions Precautions Precautions: Fall Required Braces or Orthoses: Sling Restrictions Weight Bearing Restrictions: Yes RUE Weight Bearing: Partial weight bearing RUE Partial Weight Bearing Percentage or Pounds: 50 RLE Weight Bearing: Partial weight bearing RLE Partial Weight Bearing Percentage or Pounds: 50 Other Position/Activity Restrictions: Order states PWBing on R side, however in ortho notes mentions TDWB and a different ortho note mentions PWB.        Mobility  Bed Mobility Overal bed mobility: Needs Assistance;+2 for physical assistance Bed Mobility: Supine to Sit     Supine to sit: Max assist;+2 for physical assistance;HOB elevated     General bed mobility comments: pt needs A for all aspects of bed mobility.  cues for sequencing.    Transfers Overall transfer level: Needs assistance Equipment used: 2 person hand held assist Transfers: Sit to/from [employer] Sit to Stand: Max assist;+2 physical assistance Stand pivot transfers: Total assist;+2 physical assistance       General transfer comment: pt needs extensive A for transfer with pad utilized under hips for coming to stand and completing pivot to recliner.     Ambulation/Gait                Stairs            Wheelchair Mobility    Modified Rankin (Stroke Patients Only)       Balance Overall balance assessment: History of Falls;Needs assistance Sitting-balance support: Single extremity supported;Feet supported Sitting balance-Leahy Scale: Poor Sitting balance - Comments: pt leaning to L side and needing L UE to maintain balance.  Occasional MinA to correct LOB.                                       Pertinent Vitals/Pain      Home Living Family/patient expects to be discharged to:: Skilled nursing facility                      Prior Function Level of Independence: Needs assistance   Gait / Transfers Assistance Needed: Ambulates with RW  ADL's / Homemaking Assistance Needed: A with lower body ADLs and for homemaking tasks.    Comments: Uses O2 at baseline.     Hand Dominance        Extremity/Trunk Assessment   Upper Extremity Assessment: Defer to OT evaluation           Lower Extremity Assessment: Generalized weakness;RLE deficits/detail RLE Deficits / Details: Strength grossly 2-/5, but sensation intact.      Cervical / Trunk Assessment: Normal  Communication   Communication: No difficulties  Cognition Arousal/Alertness: Awake/alert Behavior  During Therapy: WFL for tasks assessed/performed Overall Cognitive Status: Within Functional Limits for tasks assessed                      General Comments      Exercises        Assessment/Plan    PT Assessment Patient needs continued PT services  PT Diagnosis Difficulty walking;Generalized weakness   PT Problem List Decreased strength;Decreased activity tolerance;Decreased balance;Decreased mobility;Decreased coordination;Decreased knowledge of use of DME;Decreased knowledge of precautions;Obesity;Pain  PT Treatment Interventions DME instruction;Gait training;Functional mobility training;Therapeutic  activities;Therapeutic exercise;Balance training;Patient/family education   PT Goals (Current goals can be found in the Care Plan section) Acute Rehab PT Goals Patient Stated Goal: Be able to walk again. PT Goal Formulation: With patient Time For Goal Achievement: 12/25/15 Potential to Achieve Goals: Good    Frequency Min 3X/week   Barriers to discharge        Co-evaluation               End of Session Equipment Utilized During Treatment: Gait belt;Oxygen Activity Tolerance: Patient limited by fatigue;Patient limited by pain Patient left: in chair;with call bell/phone within reach Nurse Communication: Mobility status;Need for lift equipment         Time: [id number] PT Time Calculation (min) (ACUTE ONLY): 26 min   Charges:   PT Evaluation $Initial PT Evaluation Tier I: 1 Procedure PT Treatments $Therapeutic Activity: 8-22 mins   PT G CodesSunny Schlein:        Skyler Carel F, [place] CarolinaPT [phone] 12/11/2015, 2:40 PM

## 2015-12-11 NOTE — Progress Notes (Signed)
Orthopedic Tech Progress Note Patient Details:  Katie FergusonDessie A Melendez 01-03-1933 [id number]  Patient ID: Katie Fergusonessie A Melendez, female   DOB: 01-03-1933, 81 y.o.   MRN: [id number]  Delete one ortho tech visit  Nikki DomCrawford, Shallen Luedke 12/11/2015, 8:58 AM

## 2015-12-11 NOTE — Progress Notes (Signed)
Called results of stat labs to [facility] [place] 40 mg lasix, nebs  And VG can ordered will continue to monitor

## 2015-12-11 NOTE — Progress Notes (Signed)
Subjective: 1 Day Post-Op Procedure(s) (LRB): OPEN REDUCTION INTERNAL FIXATION (ORIF) ELBOW/OLECRANON FRACTURE (Right) Patient reports pain as moderate.  Long-arm fiberglass splint is intact to right upper extremity. She reports less elbow pain than pre op.  Objective: Vital signs in last 24 hours: Temp:  [97.2 F (36.2 C)-98.7 F (37.1 C)] 97.7 F (36.5 C) (12/15 0800) Pulse Rate:  [49-137] 97 (12/15 0345) Resp:  [14-22] 15 (12/15 0345) BP: (123-147)/(63-105) 129/64 mmHg (12/15 0345) SpO2:  [85 %-98 %] 97 % (12/15 0842) FiO2 (%):  [50 %] 50 % (12/15 0833) Weight:  [83.4 kg (183 lb 13.8 oz)] 83.4 kg (183 lb 13.8 oz) (12/15 0343)  Intake/Output from previous day: 12/14 0701 - 12/15 0700 In: 1220 [P.O.:120; I.V.:900] Out: 550 [Urine:450; Blood:100] Intake/Output this shift: Total I/O In: -  Out: 650 [Urine:650]   Recent Labs  12/09/15 0534 12/10/15 0349 12/11/15 0124  HGB 10.9* 9.9* 8.9*    Recent Labs  12/10/15 0349 12/11/15 0124  WBC 6.1 9.1  RBC 3.42* 3.07*  HCT 32.4* 29.2*  PLT 170 145*    Recent Labs  12/10/15 0349 12/11/15 0124  NA 135 139  K 4.1 4.7  CL 96* 99*  CO2 30 33*  BUN 23* 27*  CREATININE 1.54* 1.64*  GLUCOSE 156* 104*  CALCIUM 8.5* 8.0*    Recent Labs  12/09/15 2135 12/10/15 0945  INR 2.01* 1.54*  right upper extremity exam:Long arm posterior splint intact.  The splint is a little flimsy.  She moves her fingers actively.  Mild swelling in her fingers. Right hip exam: Some tenderness about the right hip  over the pubic ramus.  Bilateral calves are soft.nontender.    Assessment/Plan: 1 Day Post-Op Procedure(s) (LRB): OPEN REDUCTION INTERNAL FIXATION (ORIF) ELBOW/OLECRANON FRACTURE (Right)  Right inferior/superior pubic rami fractures with nondisplaced anterior column fracture. Plan; I will have him see [facility] to put on a 4 inch wide new fiberglass splint. Out of bed to chair  With physical therapy when medically  stable. Partial weightbearing 50% on right upper and right lower extremities. He'll probably need skilled nursing facility in future. Encourage mobilization to decrease risk of pulmonary issues. We will follow the patient along on a daily basis.  Kelani Robart G 12/11/2015, 9:21 AM

## 2015-12-11 NOTE — Progress Notes (Signed)
Orthopedic Tech Progress Note Patient Details:  Katie FergusonDessie A Melendez 1933-04-17 [id number]  Ortho Devices Type of Ortho Device: Ace wrap, Long arm splint Ortho Device/Splint Location: rue Ortho Device/Splint Interventions: Application   Perfecto Purdy 12/11/2015, 8:56 AM

## 2015-12-12 LAB — GLUCOSE, CAPILLARY
Glucose-Capillary: 98 mg/dL (ref 65–99)
Glucose-Capillary: 104 mg/dL — ABNORMAL HIGH (ref 65–99)
Glucose-Capillary: 112 mg/dL — ABNORMAL HIGH (ref 65–99)
Glucose-Capillary: 106 mg/dL — ABNORMAL HIGH (ref 65–99)

## 2015-12-12 LAB — PROTIME-INR
INR: 1.34 (ref 0.00–1.49)
Prothrombin Time: 16.7 seconds — ABNORMAL HIGH (ref 11.6–15.2)

## 2015-12-12 MED ORDER — POLYETHYLENE GLYCOL 3350 17 G PO PACK
17.0000 g | PACK | Freq: Every day | ORAL | Status: DC
  Administered 2015-12-12 – 2015-12-15 (×4): 17 g via ORAL
  Filled 2015-12-12 (×6): qty 1

## 2015-12-12 MED ORDER — WARFARIN SODIUM 5 MG PO TABS
5.0000 mg | ORAL_TABLET | Freq: Once | ORAL | Status: AC
  Filled 2015-12-12: qty 1

## 2015-12-12 MED ORDER — FUROSEMIDE 10 MG/ML IJ SOLN
60.0000 mg | Freq: Two times a day (BID) | INTRAMUSCULAR | Status: DC
  Administered 2015-12-12 – 2015-12-13 (×2): 60 mg via INTRAVENOUS
  Filled 2015-12-12 (×3): qty 6

## 2015-12-12 MED ORDER — IPRATROPIUM-ALBUTEROL 0.5-2.5 (3) MG/3ML IN SOLN
3.0000 mL | RESPIRATORY_TRACT | Status: DC
  Administered 2015-12-12 (×2): 3 mL via RESPIRATORY_TRACT
  Filled 2015-12-12 (×2): qty 3

## 2015-12-12 MED ORDER — DILTIAZEM HCL ER COATED BEADS 120 MG PO CP24
240.0000 mg | ORAL_CAPSULE | Freq: Every day | ORAL | Status: DC
  Administered 2015-12-12 – 2015-12-15 (×4): 240 mg via ORAL
  Filled 2015-12-12 (×4): qty 2

## 2015-12-12 NOTE — Progress Notes (Signed)
[facility] PROGRESS NOTE  Katie FergusonDessie A Melendez ZOX:[id number] DOB: [birth year] DOA: 12/09/2015 PCP: Bennie PieriniMARTIN,[name], [name]  brief narrative 81 year old female with history of coronary artery disease (remote PCI), COPD on home O2, chronic kidney disease, A. fib on Coumadin resumed of skilled nursing facility presented to the ED after sustaining a fall and fractured her right elbow and sustained a mildly displaced fracture of right superior inferior pubic rami. Patient admitted to hospitalist service and orthopedic surgeon consulted.  Assessment/Plan: Right olecranon fracture ORIF on 12/14. Transfer to stepdown unit given shortness of breath. Pain control as needed. Orthopedics ordered new fiberglass splint. Start physical therapy when patient able. Recommend partial weightbearing (50%) on both the right upper and lower extremities. Add bowel regimen. Patient will need to return to skilled nursing facility upon discharge.  Right pelvic and acetabular fracture Recommend partial weightbearing (50%) on right side.  Acute on chronic hypoxic respiratory failure - Last echo from 07/2015 shows EF of  50-55% with grade 3 diastolic dysfunction.. -Patient had worsening shortness of breath postop and transfer to stepdown unit.  Chest x ray shows vascula congestion. On IV lasix bid ( dose increased today) given persistent symptoms. Monitor I/O. Continue scheduled duonebs every 4 hours.   Paroxysmal A. fib Coumadin held on admission and given vitamin K. Resumed warfarin postop. Continue metoprolol and Cardizem.  Left lower leg cellulitis On empiric IV clindamycin.  CKD stage III Stable.  GERD Continue PPI  Type 2 diabetes mellitus Monitor with sliding scale coverage.  Depression Stable. Continue home medications.   DVT prophylaxis: Coumadin  Diet: Diabetic  Code Status: DNR Family Communication: None at bedside Disposition Plan: New stepdown monitoring for now. return to SNF possibly  early next week once respiratory symptoms improved and participating with PT   Consultants:  Orthopedics (Dr. Luiz BlareGraves)  Procedures:  ORIF of right elbow on 12/14  Antibiotics:  Clindamycin since 12/13  HPI/Subjective: Seen and examined. Reports her pain to be better. Patient still requiring 3-4 L oximetry nasal cannula and congestion on exam.  Objective: Filed Vitals:   12/12/15 0350 12/12/15 0738  BP:  106/69  Pulse:    Temp: 97.8 F (36.6 C)   Resp:      Intake/Output Summary (Last 24 hours) at 12/12/15 1106 Last data filed at 12/12/15 1000  Gross per 24 hour  Intake   1110 ml  Output    800 ml  Net    310 ml   Filed Weights   12/10/15 0452 12/11/15 0343 12/12/15 0356  Weight: 81.1 kg (178 lb 12.7 oz) 83.4 kg (183 lb 13.8 oz) 82.6 kg (182 lb 1.6 oz)    Exam:   General:  not in distress  HEENT:  moist oral mucosa, supple neck  Chest: diffuse  rhonchi and bibasilar  crackles  CVS: S1 and S2 irregular, no murmurs rub or gallop  GI: Soft, nondistended, nontender, bowel sounds present,   Musculoskeletal: Limited mobility of the right hip, dressing Over right elbow, 1+ pitting edema bilaterally. Left leg cellulitis improving.  CNS: Alert and oriented  Data Reviewed: Basic Metabolic Panel:  Recent Labs Lab 12/09/15 0534 12/09/15 1205 12/10/15 0349 12/11/15 0124 12/11/15 0818  NA 138 138 135 139 140  K 4.1 4.3 4.1 4.7 4.7  CL 97* 99* 96* 99* 98*  CO2 35* 33* 30 33* 31  GLUCOSE 126* 148* 156* 104* 81  BUN 20 21* 23* 27* 26*  CREATININE 1.58* 1.56* 1.54* 1.64* 1.53*  CALCIUM 8.7* 8.4* 8.5* 8.0*  8.2*   Liver Function Tests: No results for input(s): AST, ALT, ALKPHOS, BILITOT, PROT, ALBUMIN in the last 168 hours. No results for input(s): LIPASE, AMYLASE in the last 168 hours. No results for input(s): AMMONIA in the last 168 hours. CBC:  Recent Labs Lab 12/09/15 0534 12/10/15 0349 12/11/15 0124 12/11/15 0818  WBC 11.6* 6.1 9.1 8.7   NEUTROABS 10.1*  --   --   --   HGB 10.9* 9.9* 8.9* 9.3*  HCT 35.7* 32.4* 29.2* 30.0*  MCV 96.2 94.7 95.1 94.6  PLT 194 170 145* 155   Cardiac Enzymes:  Recent Labs Lab 12/11/15 0124 12/11/15 0818 12/11/15 1250  TROPONINI 0.04* 0.05* 0.03   BNP (last 3 results)  Recent Labs  11/13/15 0845 12/09/15 1205 12/11/15 0124  BNP 882.8* 1201.2* 739.1*    ProBNP (last 3 results) No results for input(s): PROBNP in the last 8760 hours.  CBG:  Recent Labs Lab 12/11/15 0804 12/11/15 1151 12/11/15 1615 12/11/15 2135 12/12/15 0804  GLUCAP 86 149* 137* 101* 98    Recent Results (from the past 240 hour(s))  Surgical pcr screen     Status: None   Collection Time: 12/10/15  3:15 PM  Result Value Ref Range Status   MRSA, PCR NEGATIVE NEGATIVE Final   Staphylococcus aureus NEGATIVE NEGATIVE Final    Comment:        The Xpert SA Assay (FDA approved for NASAL specimens in patients over 104 years of age), is one component of a comprehensive surveillance program.  Test performance has been validated by [facility] for patients greater than or equal to 53 year old. It is not intended to diagnose infection nor to guide or monitor treatment.      Studies: Dg Chest Port 1 View  12/11/2015  CLINICAL DATA:  Hypoxia.  Patient is on oxygen. EXAM: PORTABLE CHEST 1 VIEW COMPARISON:  12/09/2015 FINDINGS: Shallow inspiration. Cardiac enlargement with mild pulmonary vascular congestion. Hazy perihilar and basilar infiltrates suggest early edema. No focal consolidation. Blunting of the costophrenic angles suggests small effusions. Calcified and tortuous aorta. No pneumothorax. IMPRESSION: Cardiac enlargement with mild pulmonary vascular congestion and basilar edema. Probable small bilateral pleural effusions. Electronically Signed   By: Burman Nieves M.D.   On: 12/11/2015 01:33    Scheduled Meds: . busPIRone  7.5 mg Oral BID  . citalopram  20 mg Oral Daily  . clindamycin (CLEOCIN)  IV  600 mg Intravenous Q8H  . diltiazem  240 mg Oral Daily  . fluticasone  2 spray Each Nare Daily  . furosemide  60 mg Intravenous BID  . insulin aspart  0-9 Units Subcutaneous TID WC  . ipratropium-albuterol  3 mL Nebulization Q4H  . isosorbide mononitrate  30 mg Oral Daily  . levalbuterol  1.25 mg Nebulization Q6H  . loratadine  10 mg Oral Daily  . metoprolol  100 mg Oral BID  . pantoprazole  40 mg Oral Daily  . polyethylene glycol  17 g Oral Daily  . potassium chloride SA  20 mEq Oral Daily  . Warfarin - Pharmacist Dosing Inpatient   Does not apply q1800   Continuous Infusions:     Time spent: 35 minutes    Kenric Ginger  [facility] Pager [phone] If 7PM-7AM, please contact night-coverage at [email or web address], password [facility] 12/12/2015, 11:06 AM  LOS: 3 days

## 2015-12-12 NOTE — Progress Notes (Signed)
ANTICOAGULATION CONSULT NOTE - Initial Consult  Pharmacy Consult for warfarin Indication: atrial fibrillation  No Known Allergies  Patient Measurements: Height: 5' (152.4 cm) Weight: 182 lb 1.6 oz (82.6 kg) IBW/kg (Calculated) : 45.5 Heparin Dosing Weight:   Vital Signs: Temp: 97.8 F (36.6 C) (12/16 0350) Temp Source: Oral (12/16 0350) BP: 106/69 mmHg (12/16 0738) Pulse Rate: 87 (12/16 0345)  Labs:  Recent Labs  12/10/15 0349 12/10/15 0945 12/11/15 0124 12/11/15 0818 12/11/15 1250 12/12/15 0356  HGB 9.9*  --  8.9* 9.3*  --   --   HCT 32.4*  --  29.2* 30.0*  --   --   PLT 170  --  145* 155  --   --   LABPROT  --  18.6*  --  16.6*  --  16.7*  INR  --  1.54*  --  1.32  --  1.34  CREATININE 1.54*  --  1.64* 1.53*  --   --   TROPONINI  --   --  0.04* 0.05* 0.03  --     Estimated Creatinine Clearance: 27.9 mL/min (by C-G formula based on Cr of 1.53).   Medical History: Past Medical History  Diagnosis Date  . Hypertension   . CAD (coronary artery disease) 5/96  . Edema   . Hyperlipidemia   . Chronic atrial fibrillation (HCC)     Coumadin therapy   . Osteopenia   . Renal insufficiency   . Closed right ankle fracture   . CHF (congestive heart failure) (HCC)   . LBP (low back pain)   . Arthritis   . Cataract   . COPD (chronic obstructive pulmonary disease) (HCC)     wears O2 at home as needed  . Esophageal stricture   . Esophageal dysmotility 09/2011    seen on barium esophagram.  . Carotid artery occlusion   . Myocardial infarction [facility](HCC)     Medications:  See EMR  Assessment: Pt fell POD ORIF of R olecranon fracture. Pt was on warfarin PTA for AFib. INR 1.34. She did get vitamin K on 12/13. Going to repeat the 5mg  dose today.   PTA warfarin: 5 mg qTues/Thurs, 2.5 mg all other days  Goal of Therapy:  INR 2-3 Monitor platelets by anticoagulation protocol: Yes   Plan:   Warfarin 5 mg po x1 Daily INR  Ulyses SouthwardMinh Molli Gethers, PharmD Pager:  [phone] 12/12/2015 11:22 AM

## 2015-12-12 NOTE — Clinical Social Work Placement (Signed)
   CLINICAL SOCIAL WORK PLACEMENT  NOTE 12/16 - SNF Facility Responses Provided to Nephew [name] - See Below  Date:  12/12/2015  Patient Details  Name: [name] MRN: [id number] Date of Birth: 05-01-1933  Clinical Social Work is seeking post-discharge placement for this patient at the Skilled  Nursing Facility level of care (*CSW will initial, date and re-position this form in  chart as items are completed):  Yes   Patient/family provided with [facility] Clinical Social Work Department's list of facilities offering this level of care within the geographic area requested by the patient (or if unable, by the patient's family).  Yes   Patient/family informed of their freedom to choose among providers that offer the needed level of care, that participate in Medicare, Medicaid or managed care program needed by the patient, have an available bed and are willing to accept the patient.  Yes   Patient/family informed of [facility]'s ownership interest [facility] and [facility], as well as of the fact that they are under no obligation to receive care at these facilities.  PASRR submitted to EDS on       PASRR number received on       Existing PASRR number confirmed on 12/10/15     FL2 transmitted to all facilities in geographic area requested by pt/family on 12/10/15     FL2 transmitted to all facilities within larger geographic area on       Patient informed that his/her managed care company has contracts with or will negotiate with certain facilities, including the following:         12/12/15 - Patient/family informed of bed offers received.  Patient chooses bed at       Physician recommends and patient chooses bed at      Patient to be transferred to   on  .  Patient to be transferred to facility by       Patient family notified on   of transfer.  Name of family member notified:        PHYSICIAN Please sign FL2, Please sign DNR     Additional Comment:   12/12/15 - Nephew [name] - [phone]   _______________________________________________ Cristobal Goldmannrawford, Mazzie Brodrick Bradley, LCSW 12/12/2015, 6:14 PM

## 2015-12-12 NOTE — Care Management Note (Addendum)
Case Management Note  Patient Details  Name: [name] MRN: [id number] Date of Birth: [birth year]  Subjective/Objective:    Pt admitted for olecranon fracture               12/12/2015 CM communicated with new unit CM concerns with pt returning back to SNF and possibility that discharge plan may change, provided [name] as resource to determine discharge plan.  CM on unit will monitor for disposition needs.  12/11/15:  Pt had surgery for injury and was placed on stepdown ICU.   12/10/15 Action/Plan:  Pt was recently admitted and discharge to SNF, [facility]  CM assessed pt with nephew [name] and wife at bedside.  Nephew is very concerned with pt returning to SNF, possibly interested in pt returning home with [place] providing 24 hour supervision.  PT to be ordered post surgery.  CM consulted SW and communicated concern.   Expected Discharge Date:                  Expected Discharge Plan:  Skilled Nursing Facility  In-House Referral:  Clinical Social Work  Discharge planning Services  CM Consult  Post Acute Care Choice:    Choice offered to:     DME Arranged:    DME Agency:     HH Arranged:    HH Agency:     Status of Service:  In process, will continue to follow  Medicare Important Message Given:    Date Medicare IM Given:    Medicare IM give by:    Date Additional Medicare IM Given:    Additional Medicare Important Message give by:     If discussed at Long Length of Stay Meetings, dates discussed:    Additional Comments:  Cherylann ParrClaxton, Venesa Semidey S, RN 12/12/2015, 1:42 PM

## 2015-12-12 NOTE — Progress Notes (Signed)
Upon shift change, RN entered room and noted patient to have decreased mentation and SPO2 ranging from 78-82% on 5L , after SPO2 site changed. RN notified RT and breathing treatment administered. EtCO2 ordered and patient placed on venti mask at 10L 45% FiO2. EtCO2 placed on patient registering 55. RN will continue to monitor. See Flow sheets.

## 2015-12-12 NOTE — Care Management Important Message (Signed)
Important Message  Patient Details  Name: Katie FergusonDessie A Shrum MRN: [id number] Date of Birth: 1933/06/17   Medicare Important Message Given:  Yes    Kyla BalzarineShealy, Candyce Gambino Abena 12/12/2015, 2:33 PM

## 2015-12-12 NOTE — Progress Notes (Signed)
Subjective: 2 Days Post-Op Procedure(s) (LRB): OPEN REDUCTION INTERNAL FIXATION (ORIF) ELBOW/OLECRANON FRACTURE (Right) Patient reports pain as moderate.  Denies right elbow pain.  Does have pain in her right hip.  Was out of bed to chair yesterday.  Objective: Vital signs in last 24 hours: Temp:  [97.6 F (36.4 C)-98.5 F (36.9 C)] 98.1 F (36.7 C) (12/16 1129) Pulse Rate:  [84-95] 87 (12/16 0345) Resp:  [13-19] 19 (12/16 0345) BP: (95-142)/(60-87) 106/69 mmHg (12/16 0738) SpO2:  [86 %-99 %] 94 % (12/16 1129) Weight:  [82.6 kg (182 lb 1.6 oz)] 82.6 kg (182 lb 1.6 oz) (12/16 0356)  Intake/Output from previous day: 12/15 0701 - 12/16 0700 In: 870 [P.O.:720; IV Piggyback:150] Out: 1450 [Urine:1450] Intake/Output this shift: Total I/O In: 720 [P.O.:720] Out: 225 [Urine:225]   Recent Labs  12/10/15 0349 12/11/15 0124 12/11/15 0818  HGB 9.9* 8.9* 9.3*    Recent Labs  12/11/15 0124 12/11/15 0818  WBC 9.1 8.7  RBC 3.07* 3.17*  HCT 29.2* 30.0*  PLT 145* 155    Recent Labs  12/11/15 0124 12/11/15 0818  NA 139 140  K 4.7 4.7  CL 99* 98*  CO2 33* 31  BUN 27* 26*  CREATININE 1.64* 1.53*  GLUCOSE 104* 81  CALCIUM 8.0* 8.2*    Recent Labs  12/11/15 0818 12/12/15 0356  INR 1.32 1.34   Right upper extremity exam: Long-arm fiberglass posterior splint is intact.  She moves her fingers actively.  Minimal swelling in fingers. Right hip exam: Moderate pain with range of motion of the right hip.  Bilateral calves are soft and nontender. The patient is alert and oriented.   Assessment/Plan: 2 Days Post-Op Procedure(s) (LRB): OPEN REDUCTION INTERNAL FIXATION (ORIF) ELBOW/OLECRANON FRACTURE (Right)  Right anterior and inferior pubic rami fractures with anterior column fracture. Plan: Continue 50% weightbearing on the right lower extremity.  Continue 50% weightbearing on the right upper extremity, but she may weight-bear as tolerated with a platform walker on the  right upper extremity. Encourage mobilization. Continue physical therapy.  Santo Zahradnik G 12/12/2015, 3:20 PM

## 2015-12-13 ENCOUNTER — Inpatient Hospital Stay (HOSPITAL_COMMUNITY): Payer: Medicare Other

## 2015-12-13 DIAGNOSIS — N183 Chronic kidney disease, stage 3 unspecified: Secondary | ICD-10-CM | POA: Insufficient documentation

## 2015-12-13 DIAGNOSIS — N179 Acute kidney failure, unspecified: Secondary | ICD-10-CM

## 2015-12-13 LAB — BASIC METABOLIC PANEL
Anion gap: 8 (ref 5–15)
BUN: 37 mg/dL — ABNORMAL HIGH (ref 6–20)
CO2: 31 mmol/L (ref 22–32)
Calcium: 7.9 mg/dL — ABNORMAL LOW (ref 8.9–10.3)
Chloride: 97 mmol/L — ABNORMAL LOW (ref 101–111)
Creatinine, Ser: 2.24 mg/dL — ABNORMAL HIGH (ref 0.44–1.00)
GFR calc Af Amer: 23 mL/min — ABNORMAL LOW (ref >60)
GFR calc non Af Amer: 20 mL/min — ABNORMAL LOW (ref >60)
Glucose, Bld: 127 mg/dL — ABNORMAL HIGH (ref 65–99)
Potassium: 4.7 mmol/L (ref 3.5–5.1)
Sodium: 136 mmol/L (ref 135–145)

## 2015-12-13 LAB — GLUCOSE, CAPILLARY
Glucose-Capillary: 100 mg/dL — ABNORMAL HIGH (ref 65–99)
Glucose-Capillary: 275 mg/dL — ABNORMAL HIGH (ref 65–99)
Glucose-Capillary: 178 mg/dL — ABNORMAL HIGH (ref 65–99)
Glucose-Capillary: 170 mg/dL — ABNORMAL HIGH (ref 65–99)

## 2015-12-13 LAB — CBC
HCT: 27.6 % — ABNORMAL LOW (ref 36.0–46.0)
Hemoglobin: 8.5 g/dL — ABNORMAL LOW (ref 12.0–15.0)
MCH: 29.2 pg (ref 26.0–34.0)
MCHC: 30.8 g/dL (ref 30.0–36.0)
MCV: 94.8 fL (ref 78.0–100.0)
Platelets: 118 10*3/uL — ABNORMAL LOW (ref 150–400)
RBC: 2.91 MIL/uL — ABNORMAL LOW (ref 3.87–5.11)
RDW: 15.5 % (ref 11.5–15.5)
WBC: 7 10*3/uL (ref 4.0–10.5)

## 2015-12-13 LAB — PROTIME-INR
INR: 1.58 — ABNORMAL HIGH (ref 0.00–1.49)
Prothrombin Time: 18.9 seconds — ABNORMAL HIGH (ref 11.6–15.2)

## 2015-12-13 MED ORDER — METHYLPREDNISOLONE SODIUM SUCC 125 MG IJ SOLR
60.0000 mg | Freq: Three times a day (TID) | INTRAMUSCULAR | Status: DC
  Administered 2015-12-13 – 2015-12-17 (×15): 60 mg via INTRAVENOUS
  Filled 2015-12-13 (×15): qty 2

## 2015-12-13 MED ORDER — WARFARIN SODIUM 5 MG PO TABS
5.0000 mg | ORAL_TABLET | Freq: Once | ORAL | Status: AC
  Administered 2015-12-13: 5 mg via ORAL
  Filled 2015-12-13: qty 1

## 2015-12-13 MED ORDER — ACETYLCYSTEINE 20 % IN SOLN
3.0000 mL | RESPIRATORY_TRACT | Status: DC
  Administered 2015-12-13 – 2015-12-15 (×6): 3 mL via RESPIRATORY_TRACT
  Filled 2015-12-13 (×7): qty 4

## 2015-12-13 MED ORDER — IPRATROPIUM-ALBUTEROL 0.5-2.5 (3) MG/3ML IN SOLN
3.0000 mL | RESPIRATORY_TRACT | Status: DC
  Administered 2015-12-13 – 2015-12-15 (×11): 3 mL via RESPIRATORY_TRACT
  Filled 2015-12-13 (×11): qty 3

## 2015-12-13 MED ORDER — ALBUTEROL SULFATE (2.5 MG/3ML) 0.083% IN NEBU
2.5000 mg | INHALATION_SOLUTION | RESPIRATORY_TRACT | Status: DC | PRN
  Filled 2015-12-13: qty 3

## 2015-12-13 MED ORDER — FUROSEMIDE 10 MG/ML IJ SOLN
80.0000 mg | Freq: Two times a day (BID) | INTRAMUSCULAR | Status: DC
  Administered 2015-12-13 – 2015-12-15 (×4): 80 mg via INTRAVENOUS
  Filled 2015-12-13 (×4): qty 8

## 2015-12-13 NOTE — Progress Notes (Signed)
ANTICOAGULATION CONSULT NOTE - Initial Consult  Pharmacy Consult for warfarin Indication: atrial fibrillation  No Known Allergies  Patient Measurements: Height: 5' (152.4 cm) Weight: 180 lb 5.4 oz (81.8 kg) IBW/kg (Calculated) : 45.5 Heparin Dosing Weight:   Vital Signs: Temp: 98.1 F (36.7 C) (12/17 0804) Temp Source: Oral (12/17 0804) BP: 142/73 mmHg (12/17 0807) Pulse Rate: 97 (12/17 0807)  Labs:  Recent Labs  12/11/15 0124 12/11/15 0818 12/11/15 1250 12/12/15 0356 12/13/15 0459  HGB 8.9* 9.3*  --   --  8.5*  HCT 29.2* 30.0*  --   --  27.6*  PLT 145* 155  --   --  118*  LABPROT  --  16.6*  --  16.7* 18.9*  INR  --  1.32  --  1.34 1.58*  CREATININE 1.64* 1.53*  --   --  2.24*  TROPONINI 0.04* 0.05* 0.03  --   --     Estimated Creatinine Clearance: 19 mL/min (by C-G formula based on Cr of 2.24).   Medical History: Past Medical History  Diagnosis Date  . Hypertension   . CAD (coronary artery disease) 5/96  . Edema   . Hyperlipidemia   . Chronic atrial fibrillation (HCC)     Coumadin therapy   . Osteopenia   . Renal insufficiency   . Closed right ankle fracture   . CHF (congestive heart failure) (HCC)   . LBP (low back pain)   . Arthritis   . Cataract   . COPD (chronic obstructive pulmonary disease) (HCC)     wears O2 at home as needed  . Esophageal stricture   . Esophageal dysmotility 09/2011    seen on barium esophagram.  . Carotid artery occlusion   . Myocardial infarction [facility](HCC)     Medications:  See EMR  Assessment: Pt fell POD ORIF of R olecranon fracture. Pt was on warfarin PTA for AFib. Pt did not receive 5 mg dose yesterday. Was ordered correctly. No documented reason for holding dose. INR 1.58.   PTA warfarin: 5 mg qTues/Thurs, 2.5 mg all other days  Goal of Therapy:  INR 2-3 Monitor platelets by anticoagulation protocol: Yes   Plan:  Warfarin 5 mg po x1 Daily INR  Greggory Stallionristy Reyes, PharmD Clinical Pharmacy Resident Pager #  [phone] 12/13/2015 3:09 PM

## 2015-12-13 NOTE — Progress Notes (Signed)
[facility] PROGRESS NOTE  Katie FergusonDessie A Melendez ZOX:[id number] DOB: 28-May-1933 DOA: 12/09/2015 PCP: Bennie PieriniMARTIN,Katie MARGARET, [name]  brief narrative 81 year old female with history of coronary artery disease (remote PCI), COPD on home O2, chronic kidney disease, A. fib on Coumadin resumed of skilled nursing facility presented to the ED after sustaining a fall and fractured her right elbow and sustained a mildly displaced fracture of right superior inferior pubic rami. Patient admitted to hospitalist service and orthopedic surgeon consulted.  Assessment/Plan: Right olecranon fracture ORIF on 12/14. Transferred to stepdown unit given shortness of breath. Pain control as needed. Orthopedics ordered new fiberglass splint. Start physical therapy when patient able. Recommend partial weightbearing (50%) on both the right upper and lower extremities. Add bowel regimen. Patient will need to return to skilled nursing facility upon discharge.  Right pelvic and acetabular fracture Recommend partial weightbearing (50%) on right side.  Acute on chronic hypoxic respiratory failure Likely a combination of acute diastolic CHF and COPD exacerbation. - Last echo from 07/2015 shows EF of  50-55% with grade 3 diastolic dysfunction.. -Patient had worsening shortness of breath postop and transfer to stepdown unit.  Chest x ray shows persistent vascular congestion. Actively wheezing today and requiring up to 5 L O2 via nasal cannula. We'll increase Lasix dose further to 80 mg twice a day, place on scheduled nebs and add Solu-Medrol.  Acute on chronic kidney disease stage III Renal function worsened due to IV Lasix. Monitor closely. Avoid nephrotoxins.  Paroxysmal A. fib Coumadin held on admission and given vitamin K. Resumed warfarin postop. Continue metoprolol and Cardizem.  Left lower leg cellulitis On empiric IV clindamycin. Improving    GERD Continue PPI  Type 2 diabetes mellitus Monitor with sliding scale  coverage.  Depression Stable. Continue home medications.   DVT prophylaxis: Coumadin  Diet: Diabetic  Code Status: DNR Family Communication: None at bedside Disposition Plan: Continue stepdown monitoring. return to SNF possibly early next week once respiratory symptoms improved and participating with PT   Consultants:  Orthopedics (Dr. Luiz BlareGraves)  Procedures:  ORIF of right elbow on 12/14  Antibiotics:  Clindamycin since 12/13  HPI/Subjective: Seen and examined. Continues to be short of breath and respiratory failure. Reports off-and-on pain in her arm and hip which is controlled with pain medications.  Objective: Filed Vitals:   12/13/15 0804 12/13/15 0807  BP: 133/112 142/73  Pulse: 102 97  Temp: 98.1 F (36.7 C)   Resp: 21 18    Intake/Output Summary (Last 24 hours) at 12/13/15 1412 Last data filed at 12/13/15 1130  Gross per 24 hour  Intake    760 ml  Output   1425 ml  Net   -665 ml   Filed Weights   12/11/15 0343 12/12/15 0356 12/13/15 0423  Weight: 83.4 kg (183 lb 13.8 oz) 82.6 kg (182 lb 1.6 oz) 81.8 kg (180 lb 5.4 oz)    Exam:   General:  not in distress, actively wheezing  HEENT:  moist oral mucosa, supple neck  Chest:  diffuse bilateral wheezing and bibasilar crackles  CVS: S1 and S2 irregular, no murmurs rub or gallop  GI: Soft, nondistended, nontender, bowel sounds present,   Musculoskeletal: Limited mobility of the right hip, dressing Over right elbow, 1+ pitting edema bilaterally. Left leg cellulitis improving.  CNS: Alert and oriented  Data Reviewed: Basic Metabolic Panel:  Recent Labs Lab 12/09/15 1205 12/10/15 0349 12/11/15 0124 12/11/15 0818 12/13/15 0459  NA 138 135 139 140 136  K 4.3 4.1 4.7  4.7 4.7  CL 99* 96* 99* 98* 97*  CO2 33* 30 33* 31 31  GLUCOSE 148* 156* 104* 81 127*  BUN 21* 23* 27* 26* 37*  CREATININE 1.56* 1.54* 1.64* 1.53* 2.24*  CALCIUM 8.4* 8.5* 8.0* 8.2* 7.9*   Liver Function Tests: No results for  input(s): AST, ALT, ALKPHOS, BILITOT, PROT, ALBUMIN in the last 168 hours. No results for input(s): LIPASE, AMYLASE in the last 168 hours. No results for input(s): AMMONIA in the last 168 hours. CBC:  Recent Labs Lab 12/09/15 0534 12/10/15 0349 12/11/15 0124 12/11/15 0818 12/13/15 0459  WBC 11.6* 6.1 9.1 8.7 7.0  NEUTROABS 10.1*  --   --   --   --   HGB 10.9* 9.9* 8.9* 9.3* 8.5*  HCT 35.7* 32.4* 29.2* 30.0* 27.6*  MCV 96.2 94.7 95.1 94.6 94.8  PLT 194 170 145* 155 118*   Cardiac Enzymes:  Recent Labs Lab 12/11/15 0124 12/11/15 0818 12/11/15 1250  TROPONINI 0.04* 0.05* 0.03   BNP (last 3 results)  Recent Labs  11/13/15 0845 12/09/15 1205 12/11/15 0124  BNP 882.8* 1201.2* 739.1*    ProBNP (last 3 results) No results for input(s): PROBNP in the last 8760 hours.  CBG:  Recent Labs Lab 12/12/15 1233 12/12/15 1629 12/12/15 2139 12/13/15 0803 12/13/15 1156  GLUCAP 104* 112* 106* 100* 275*    Recent Results (from the past 240 hour(s))  Surgical pcr screen     Status: None   Collection Time: 12/10/15  3:15 PM  Result Value Ref Range Status   MRSA, PCR NEGATIVE NEGATIVE Final   Staphylococcus aureus NEGATIVE NEGATIVE Final    Comment:        The Xpert SA Assay (FDA approved for NASAL specimens in patients over 69 years of age), is one component of a comprehensive surveillance program.  Test performance has been validated by [facility] for patients greater than or equal to 83 year old. It is not intended to diagnose infection nor to guide or monitor treatment.      Studies: Dg Chest Port 1 View  12/13/2015  CLINICAL DATA:  Patient with shortness of breath and cough for 2 days. EXAM: PORTABLE CHEST 1 VIEW COMPARISON:  Chest radiograph 12/11/2015. FINDINGS: Multiple monitoring leads overlie the patient. Stable enlarged cardiac and mediastinal contours. Peritracheal soft tissues are nonspecific however may be vascular in etiology. Interval development  of mid and lower lung heterogeneous pulmonary opacities. No large area of pleural effusion or pneumothorax. IMPRESSION: Interval development mid and lower lung heterogeneous opacities which may represent atelectasis and/or edema. Peritracheal soft tissues nonspecific however may be vascular in etiology. Electronically Signed   By: Annia Belt M.D.   On: 12/13/2015 09:53    Scheduled Meds: . busPIRone  7.5 mg Oral BID  . citalopram  20 mg Oral Daily  . clindamycin (CLEOCIN) IV  600 mg Intravenous Q8H  . diltiazem  240 mg Oral Daily  . fluticasone  2 spray Each Nare Daily  . furosemide  80 mg Intravenous BID  . insulin aspart  0-9 Units Subcutaneous TID WC  . ipratropium-albuterol  3 mL Nebulization Q4H  . isosorbide mononitrate  30 mg Oral Daily  . loratadine  10 mg Oral Daily  . methylPREDNISolone (SOLU-MEDROL) injection  60 mg Intravenous 3 times per day  . metoprolol  100 mg Oral BID  . pantoprazole  40 mg Oral Daily  . polyethylene glycol  17 g Oral Daily  . potassium chloride SA  20  mEq Oral Daily  . warfarin  5 mg Oral ONCE-1800  . Warfarin - Pharmacist Dosing Inpatient   Does not apply q1800   Continuous Infusions:     Time spent: 35 minutes    Tiwana Chavis  [facility] Pager [phone] If 7PM-7AM, please contact night-coverage at [email or web address], password [facility] 12/13/2015, 2:12 PM  LOS: 4 days

## 2015-12-13 NOTE — Progress Notes (Signed)
Dr. Gonzella Lexhungel called and informed of patients increased lethargy this afternoon and evening, with request to d/c some PRN pain meds. Vital signs remain stable. New order received will continue to monitor.

## 2015-12-13 NOTE — Progress Notes (Signed)
Patient sleepy during the day. Have discontinued prn dilaudid and oxycodone. Maintain on low dose vicodin bid prn for now. Will reassess in am. o2 sat stable on 3L with increased lasix dose.

## 2015-12-13 NOTE — Progress Notes (Signed)
Pt noted to have decreased SPO2. Attempted to NTS patient x1 without success-secretions too thick. Order received for mucomyst and patient placed back on ventimask at 55% 14L. Nursing will continue to monitor.

## 2015-12-14 ENCOUNTER — Inpatient Hospital Stay (HOSPITAL_COMMUNITY): Payer: Medicare Other

## 2015-12-14 LAB — GLUCOSE, CAPILLARY
Glucose-Capillary: 141 mg/dL — ABNORMAL HIGH (ref 65–99)
Glucose-Capillary: 182 mg/dL — ABNORMAL HIGH (ref 65–99)
Glucose-Capillary: 152 mg/dL — ABNORMAL HIGH (ref 65–99)
Glucose-Capillary: 165 mg/dL — ABNORMAL HIGH (ref 65–99)

## 2015-12-14 LAB — URINALYSIS, ROUTINE W REFLEX MICROSCOPIC
Bilirubin Urine: NEGATIVE
Glucose, UA: NEGATIVE mg/dL
Ketones, ur: NEGATIVE mg/dL
Leukocytes, UA: NEGATIVE
Nitrite: NEGATIVE
Protein, ur: NEGATIVE mg/dL
Specific Gravity, Urine: 1.014 (ref 1.005–1.030)
pH: 6.5 (ref 5.0–8.0)

## 2015-12-14 LAB — CBC
HCT: 29.2 % — ABNORMAL LOW (ref 36.0–46.0)
Hemoglobin: 9.4 g/dL — ABNORMAL LOW (ref 12.0–15.0)
MCH: 30 pg (ref 26.0–34.0)
MCHC: 32.2 g/dL (ref 30.0–36.0)
MCV: 93.3 fL (ref 78.0–100.0)
Platelets: 134 10*3/uL — ABNORMAL LOW (ref 150–400)
RBC: 3.13 MIL/uL — ABNORMAL LOW (ref 3.87–5.11)
RDW: 15.2 % (ref 11.5–15.5)
WBC: 7.1 10*3/uL (ref 4.0–10.5)

## 2015-12-14 LAB — BASIC METABOLIC PANEL WITH GFR
Anion gap: 10 (ref 5–15)
BUN: 36 mg/dL — ABNORMAL HIGH (ref 6–20)
CO2: 31 mmol/L (ref 22–32)
Calcium: 8 mg/dL — ABNORMAL LOW (ref 8.9–10.3)
Chloride: 95 mmol/L — ABNORMAL LOW (ref 101–111)
Creatinine, Ser: 1.95 mg/dL — ABNORMAL HIGH (ref 0.44–1.00)
GFR calc Af Amer: 27 mL/min — ABNORMAL LOW
GFR calc non Af Amer: 23 mL/min — ABNORMAL LOW
Glucose, Bld: 146 mg/dL — ABNORMAL HIGH (ref 65–99)
Potassium: 4.3 mmol/L (ref 3.5–5.1)
Sodium: 136 mmol/L (ref 135–145)

## 2015-12-14 LAB — URINE MICROSCOPIC-ADD ON
Bacteria, UA: NONE SEEN
Squamous Epithelial / LPF: NONE SEEN
WBC, UA: NONE SEEN WBC/hpf (ref 0–5)

## 2015-12-14 LAB — PROTIME-INR
INR: 1.59 — ABNORMAL HIGH (ref 0.00–1.49)
Prothrombin Time: 19 s — ABNORMAL HIGH (ref 11.6–15.2)

## 2015-12-14 LAB — LACTIC ACID, PLASMA: Lactic Acid, Venous: 1.2 mmol/L (ref 0.5–2.0)

## 2015-12-14 MED ORDER — WARFARIN SODIUM 7.5 MG PO TABS
7.5000 mg | ORAL_TABLET | Freq: Once | ORAL | Status: AC
  Administered 2015-12-14: 7.5 mg via ORAL
  Filled 2015-12-14: qty 1

## 2015-12-14 MED ORDER — HYDROCODONE-ACETAMINOPHEN 5-325 MG PO TABS
2.0000 | ORAL_TABLET | Freq: Four times a day (QID) | ORAL | Status: DC | PRN
  Administered 2015-12-14 – 2015-12-15 (×2): 2 via ORAL
  Filled 2015-12-14 (×2): qty 2

## 2015-12-14 MED ORDER — VANCOMYCIN HCL IN DEXTROSE 1-5 GM/200ML-% IV SOLN
1000.0000 mg | INTRAVENOUS | Status: DC
  Administered 2015-12-14 – 2015-12-18 (×5): 1000 mg via INTRAVENOUS
  Filled 2015-12-14 (×5): qty 200

## 2015-12-14 MED ORDER — PIPERACILLIN-TAZOBACTAM 3.375 G IVPB 30 MIN
3.3750 g | Freq: Once | INTRAVENOUS | Status: AC
  Administered 2015-12-14: 3.375 g via INTRAVENOUS
  Filled 2015-12-14: qty 50

## 2015-12-14 MED ORDER — PIPERACILLIN-TAZOBACTAM 3.375 G IVPB
3.3750 g | Freq: Three times a day (TID) | INTRAVENOUS | Status: DC
  Administered 2015-12-14 – 2015-12-17 (×10): 3.375 g via INTRAVENOUS
  Filled 2015-12-14 (×13): qty 50

## 2015-12-14 NOTE — Consult Note (Signed)
ANTIBIOTIC CONSULT NOTE - INITIAL  Pharmacy Consult for vanc/zosyn Indication: sepsis  No Known Allergies  Patient Measurements: Height: 5' (152.4 cm) Weight: 178 lb 2.1 oz (80.8 kg) IBW/kg (Calculated) : 45.5  Vital Signs: Temp: 98.6 F (37 C) (12/18 0718) Temp Source: Oral (12/18 0718) BP: 133/76 mmHg (12/18 0355) Pulse Rate: 111 (12/18 0500) Intake/Output from previous day: 12/17 0701 - 12/18 0700 In: 690 [P.O.:540; IV Piggyback:150] Out: 3500 [Urine:3500] Intake/Output from this shift: Total I/O In: -  Out: 200 [Urine:200]  Labs:  Recent Labs  12/13/15 0459 12/14/15 0435  WBC 7.0 7.1  HGB 8.5* 9.4*  PLT 118* 134*  CREATININE 2.24* 1.95*   Estimated Creatinine Clearance: 21.6 mL/min (by C-G formula based on Cr of 1.95). No results for input(s): VANCOTROUGH, VANCOPEAK, VANCORANDOM, GENTTROUGH, GENTPEAK, GENTRANDOM, TOBRATROUGH, TOBRAPEAK, TOBRARND, AMIKACINPEAK, AMIKACINTROU, AMIKACIN in the last 72 hours.   Microbiology: Recent Results (from the past 720 hour(s))  Surgical pcr screen     Status: None   Collection Time: 12/10/15  3:15 PM  Result Value Ref Range Status   MRSA, PCR NEGATIVE NEGATIVE Final   Staphylococcus aureus NEGATIVE NEGATIVE Final    Comment:        The Xpert SA Assay (FDA approved for NASAL specimens in patients over 79 years of age), is one component of a comprehensive surveillance program.  Test performance has been validated by [facility] for patients greater than or equal to 79 year old. It is not intended to diagnose infection nor to guide or monitor treatment.     Medical History: Past Medical History  Diagnosis Date  . Hypertension   . CAD (coronary artery disease) 5/96  . Edema   . Hyperlipidemia   . Chronic atrial fibrillation (HCC)     Coumadin therapy   . Osteopenia   . Renal insufficiency   . Closed right ankle fracture   . CHF (congestive heart failure) (HCC)   . LBP (low back pain)   . Arthritis   .  Cataract   . COPD (chronic obstructive pulmonary disease) (HCC)     wears O2 at home as needed  . Esophageal stricture   . Esophageal dysmotility 09/2011    seen on barium esophagram.  . Carotid artery occlusion   . Myocardial infarction (HCC)     Medications:  Scheduled:  . acetylcysteine  3 mL Nebulization Q4H  . busPIRone  7.5 mg Oral BID  . citalopram  20 mg Oral Daily  . diltiazem  240 mg Oral Daily  . fluticasone  2 spray Each Nare Daily  . furosemide  80 mg Intravenous BID  . insulin aspart  0-9 Units Subcutaneous TID WC  . ipratropium-albuterol  3 mL Nebulization Q4H  . isosorbide mononitrate  30 mg Oral Daily  . loratadine  10 mg Oral Daily  . methylPREDNISolone (SOLU-MEDROL) injection  60 mg Intravenous 3 times per day  . metoprolol  100 mg Oral BID  . pantoprazole  40 mg Oral Daily  . piperacillin-tazobactam  3.375 g Intravenous Once  . piperacillin-tazobactam (ZOSYN)  IV  3.375 g Intravenous 3 times per day  . polyethylene glycol  17 g Oral Daily  . potassium chloride SA  20 mEq Oral Daily  . vancomycin  1,000 mg Intravenous Q24H  . Warfarin - Pharmacist Dosing Inpatient   Does not apply q1800   Assessment: 81 year old female from skilled nursing facility admitted after sustaining a fall and fractured her right elbow and sustained a mildly  displaced fracture of right superior inferior pubic rami. Pharmacy consulted to dose vanc and zosyn for sepsis. Tmax 100.7.   Goal of Therapy:  Vancomycin trough level 15-20 mcg/ml  Plan:  Zosyn 3.375 g over 30 min x1, then 3.375 g IV q8h 4 hour infusion Vanc 1 g IV q24h  Monitor renal function, cultures, temp, WBC, vanc trough at steady state Follow up length of therapy  Greggory Stallion, PharmD Clinical Pharmacy Resident Pager # [phone] 12/14/2015 9:19 AM

## 2015-12-14 NOTE — Progress Notes (Signed)
[facility] PROGRESS NOTE  Katie FergusonDessie A Melendez NWG:[id number] DOB: 08-31-1933 DOA: 12/09/2015 PCP: Bennie PieriniMARTIN,[name], [name]  brief narrative 81 year old female with history of coronary artery disease (remote PCI), COPD on home O2, chronic kidney disease, A. fib on Coumadin resumed of skilled nursing facility presented to the ED after sustaining a fall and fractured her right elbow and sustained a mildly displaced fracture of right superior inferior pubic rami. Patient admitted to hospitalist service and orthopedic surgeon consulted.  Assessment/Plan: Right olecranon fracture ORIF on 12/14. Transferred to stepdown unit given shortness of breath. Pain control as needed. Orthopedics ordered new fiberglass splint. Start physical therapy when patient able. Recommend partial weightbearing (50%) on both the right upper and lower extremities. Add bowel regimen. Patient will need to return to skilled nursing facility upon discharge.  Right pelvic and acetabular fracture Recommend partial weightbearing (50%) on right side.  Acute on chronic hypoxic respiratory failure Likely a combination of acute diastolic CHF and COPD exacerbation. - Last echo from 07/2015 shows EF of  50-55% with grade 3 diastolic dysfunction.. -Patient had worsening shortness of breath postop and transfer to stepdown unit.  Chest x ray shows persistent vascular congestion. Lasix increased , added solumedrol and scheduled nebs. Requiring NRB this am and febrile. check CXR. Blood and urine cx ordered. Added empiric vancomycin and zosyn.   Acute on chronic kidney disease stage III Renal function worsened due to IV Lasix. Monitor closely. Avoid nephrotoxins.  Paroxysmal A. fib Coumadin held on admission and given vitamin K. Resumed warfarin postop. Continue metoprolol and Cardizem.  Left lower leg cellulitis On empiric IV clindamycin. Switch to vancomycin and zosyn for broader coverage.   GERD Continue PPI  Type 2 diabetes  mellitus Monitor with sliding scale coverage.  Depression Stable. Continue home medications.   DVT prophylaxis: Coumadin  Diet: Diabetic  Code Status: DNR Family Communication: None at bedside Disposition Plan: Continue stepdown monitoring. return to SNF possibly early next week once respiratory symptoms improved and participating with PT   Consultants:  Orthopedics (Dr. Luiz BlareGraves)  Procedures:  ORIF of right elbow on 12/14  Antibiotics:  Clindamycin since 12/13  HPI/Subjective: Seen and examined. Continues to be short of breath and respiratory failure. Reports off-and-on pain in her arm and hip which is controlled with pain medications.  Objective: Filed Vitals:   12/14/15 0500 12/14/15 0718  BP:    Pulse: 111   Temp:  98.6 F (37 C)  Resp: 19     Intake/Output Summary (Last 24 hours) at 12/14/15 1207 Last data filed at 12/14/15 0830  Gross per 24 hour  Intake    390 ml  Output   2950 ml  Net  -2560 ml   Filed Weights   12/12/15 0356 12/13/15 0423 12/14/15 0355  Weight: 82.6 kg (182 lb 1.6 oz) 81.8 kg (180 lb 5.4 oz) 80.8 kg (178 lb 2.1 oz)    Exam:   General:  On NRB  HEENT:  moist oral mucosa, supple neck  Chest:   bibasilar crackles  CVS: S1 and S2 irregular, no murmurs rub or gallop  GI: Soft, nondistended, nontender, bowel sounds present,   Musculoskeletal: Limited mobility of the right hip, dressing Over right elbow, 1+ pitting edema bilaterally ( improved). Left leg cellulitis improved.  CNS: Alert and oriented  Data Reviewed: Basic Metabolic Panel:  Recent Labs Lab 12/10/15 0349 12/11/15 0124 12/11/15 0818 12/13/15 0459 12/14/15 0435  NA 135 139 140 136 136  K 4.1 4.7 4.7 4.7 4.3  CL 96* 99* 98* 97* 95*  CO2 30 33* GLUCOSE 156* 104* 81 127* 146*  BUN 23* 27* 26* 37* 36*  CREATININE 1.54* 1.64* 1.53* 2.24* 1.95*  CALCIUM 8.5* 8.0* 8.2* 7.9* 8.0*   Liver Function Tests: No results for input(s): AST, ALT, ALKPHOS,  BILITOT, PROT, ALBUMIN in the last 168 hours. No results for input(s): LIPASE, AMYLASE in the last 168 hours. No results for input(s): AMMONIA in the last 168 hours. CBC:  Recent Labs Lab 12/09/15 0534 12/10/15 0349 12/11/15 0124 12/11/15 0818 12/13/15 0459 12/14/15 0435  WBC 11.6* 6.1 9.1 8.7 7.0 7.1  NEUTROABS 10.1*  --   --   --   --   --   HGB 10.9* 9.9* 8.9* 9.3* 8.5* 9.4*  HCT 35.7* 32.4* 29.2* 30.0* 27.6* 29.2*  MCV 96.2 94.7 95.1 94.6 94.8 93.3  PLT 194 170 145* 155 118* 134*   Cardiac Enzymes:  Recent Labs Lab 12/11/15 0124 12/11/15 0818 12/11/15 1250  TROPONINI 0.04* 0.05* 0.03   BNP (last 3 results)  Recent Labs  11/13/15 0845 12/09/15 1205 12/11/15 0124  BNP 882.8* 1201.2* 739.1*    ProBNP (last 3 results) No results for input(s): PROBNP in the last 8760 hours.  CBG:  Recent Labs Lab 12/13/15 0803 12/13/15 1156 12/13/15 1634 12/13/15 2200 12/14/15 0827  GLUCAP 100* 275* 178* 170* 141*    Recent Results (from the past 240 hour(s))  Surgical pcr screen     Status: None   Collection Time: 12/10/15  3:15 PM  Result Value Ref Range Status   MRSA, PCR NEGATIVE NEGATIVE Final   Staphylococcus aureus NEGATIVE NEGATIVE Final    Comment:        The Xpert SA Assay (FDA approved for NASAL specimens in patients over 69 years of age), is one component of a comprehensive surveillance program.  Test performance has been validated by [facility] for patients greater than or equal to 38 year old. It is not intended to diagnose infection nor to guide or monitor treatment.      Studies: Dg Chest Port 1 View  12/13/2015  CLINICAL DATA:  Patient with shortness of breath and cough for 2 days. EXAM: PORTABLE CHEST 1 VIEW COMPARISON:  Chest radiograph 12/11/2015. FINDINGS: Multiple monitoring leads overlie the patient. Stable enlarged cardiac and mediastinal contours. Peritracheal soft tissues are nonspecific however may be vascular in etiology.  Interval development of mid and lower lung heterogeneous pulmonary opacities. No large area of pleural effusion or pneumothorax. IMPRESSION: Interval development mid and lower lung heterogeneous opacities which may represent atelectasis and/or edema. Peritracheal soft tissues nonspecific however may be vascular in etiology. Electronically Signed   By: Annia Belt M.D.   On: 12/13/2015 09:53    Scheduled Meds: . acetylcysteine  3 mL Nebulization Q4H  . busPIRone  7.5 mg Oral BID  . citalopram  20 mg Oral Daily  . diltiazem  240 mg Oral Daily  . fluticasone  2 spray Each Nare Daily  . furosemide  80 mg Intravenous BID  . insulin aspart  0-9 Units Subcutaneous TID WC  . ipratropium-albuterol  3 mL Nebulization Q4H  . isosorbide mononitrate  30 mg Oral Daily  . loratadine  10 mg Oral Daily  . methylPREDNISolone (SOLU-MEDROL) injection  60 mg Intravenous 3 times per day  . metoprolol  100 mg Oral BID  . pantoprazole  40 mg Oral Daily  . piperacillin-tazobactam (ZOSYN)  IV  3.375 g Intravenous 3  times per day  . polyethylene glycol  17 g Oral Daily  . potassium chloride SA  20 mEq Oral Daily  . vancomycin  1,000 mg Intravenous Q24H  . warfarin  7.5 mg Oral ONCE-1800  . Warfarin - Pharmacist Dosing Inpatient   Does not apply q1800   Continuous Infusions:     Time spent: 35 minutes    Nimai Burbach  [facility] Pager [phone] If 7PM-7AM, please contact night-coverage at [email or web address], password [facility] 12/14/2015, 12:07 PM  LOS: 5 days

## 2015-12-14 NOTE — Progress Notes (Signed)
UR COMPLETED  

## 2015-12-14 NOTE — Consult Note (Signed)
ANTICOAGULATION CONSULT NOTE - Follow up Consult  Pharmacy Consult for warfarin Indication: atrial fibrillation  No Known Allergies  Patient Measurements: Height: 5' (152.4 cm) Weight: 178 lb 2.1 oz (80.8 kg) IBW/kg (Calculated) : 45.5  Vital Signs: Temp: 98.6 F (37 C) (12/18 0718) Temp Source: Oral (12/18 0718) BP: 133/76 mmHg (12/18 0355) Pulse Rate: 111 (12/18 0500)  Labs:  Recent Labs  12/11/15 1250 12/12/15 0356 12/13/15 0459 12/14/15 0435  HGB  --   --  8.5* 9.4*  HCT  --   --  27.6* 29.2*  PLT  --   --  118* 134*  LABPROT  --  16.7* 18.9* 19.0*  INR  --  1.34 1.58* 1.59*  CREATININE  --   --  2.24* 1.95*  TROPONINI 0.03  --   --   --     Estimated Creatinine Clearance: 21.6 mL/min (by C-G formula based on Cr of 1.95).   Medical History: Past Medical History  Diagnosis Date  . Hypertension   . CAD (coronary artery disease) 5/96  . Edema   . Hyperlipidemia   . Chronic atrial fibrillation (HCC)     Coumadin therapy   . Osteopenia   . Renal insufficiency   . Closed right ankle fracture   . CHF (congestive heart failure) (HCC)   . LBP (low back pain)   . Arthritis   . Cataract   . COPD (chronic obstructive pulmonary disease) (HCC)     wears O2 at home as needed  . Esophageal stricture   . Esophageal dysmotility 09/2011    seen on barium esophagram.  . Carotid artery occlusion   . Myocardial infarction [facility](HCC)     Medications:  See EMR  Assessment: Pt fell POD ORIF of R olecranon fracture. Pt was on warfarin PTA for AFib. 12/16 dose not documented as given. INR 1.59.   PTA warfarin: 5 mg qTues/Thurs, 2.5 mg all other days  Goal of Therapy:  INR 2-3 Monitor platelets by anticoagulation protocol: Yes   Plan: Warfarin 7.5 mg po x1 Daily INR Monitor s/sx bleeding  Greggory Stallionristy Reyes, PharmD Clinical Pharmacy Resident Pager # [phone] 12/14/2015 9:20 AM

## 2015-12-14 NOTE — Progress Notes (Signed)
SPO2 noted to be steadily decreasing. Pt alert and c/o severe pain to right side. Pain med administered per order.  Pt administered breathing treatment and placed pt on NRB at 15L. Physician extender aware of change in status. RN will continue to monitor.

## 2015-12-15 DIAGNOSIS — J189 Pneumonia, unspecified organism: Secondary | ICD-10-CM

## 2015-12-15 DIAGNOSIS — R0602 Shortness of breath: Secondary | ICD-10-CM | POA: Insufficient documentation

## 2015-12-15 DIAGNOSIS — R000 Tachycardia, unspecified: Secondary | ICD-10-CM

## 2015-12-15 DIAGNOSIS — Z515 Encounter for palliative care: Secondary | ICD-10-CM | POA: Insufficient documentation

## 2015-12-15 LAB — URINE CULTURE: Culture: NO GROWTH

## 2015-12-15 LAB — BASIC METABOLIC PANEL
Anion gap: 13 (ref 5–15)
BUN: 39 mg/dL — ABNORMAL HIGH (ref 6–20)
CO2: 29 mmol/L (ref 22–32)
Calcium: 8 mg/dL — ABNORMAL LOW (ref 8.9–10.3)
Chloride: 93 mmol/L — ABNORMAL LOW (ref 101–111)
Creatinine, Ser: 1.73 mg/dL — ABNORMAL HIGH (ref 0.44–1.00)
GFR calc Af Amer: 31 mL/min — ABNORMAL LOW (ref >60)
GFR calc non Af Amer: 27 mL/min — ABNORMAL LOW (ref >60)
Glucose, Bld: 160 mg/dL — ABNORMAL HIGH (ref 65–99)
Potassium: 3.6 mmol/L (ref 3.5–5.1)
Sodium: 135 mmol/L (ref 135–145)

## 2015-12-15 LAB — GLUCOSE, CAPILLARY
Glucose-Capillary: 140 mg/dL — ABNORMAL HIGH (ref 65–99)
Glucose-Capillary: 199 mg/dL — ABNORMAL HIGH (ref 65–99)
Glucose-Capillary: 145 mg/dL — ABNORMAL HIGH (ref 65–99)
Glucose-Capillary: 135 mg/dL — ABNORMAL HIGH (ref 65–99)

## 2015-12-15 LAB — PROTIME-INR
INR: 1.66 — ABNORMAL HIGH (ref 0.00–1.49)
Prothrombin Time: 19.6 seconds — ABNORMAL HIGH (ref 11.6–15.2)

## 2015-12-15 MED ORDER — MORPHINE SULFATE (PF) 2 MG/ML IV SOLN
2.0000 mg | INTRAVENOUS | Status: DC | PRN
Start: 2015-12-15 — End: 2015-12-18
  Administered 2015-12-16 – 2015-12-17 (×2): 2 mg via INTRAVENOUS
  Filled 2015-12-15 (×4): qty 1

## 2015-12-15 MED ORDER — WARFARIN SODIUM 5 MG PO TABS
5.0000 mg | ORAL_TABLET | Freq: Once | ORAL | Status: AC
  Administered 2015-12-15: 5 mg via ORAL
  Filled 2015-12-15: qty 1

## 2015-12-15 MED ORDER — ACETAMINOPHEN 325 MG PO TABS
650.0000 mg | ORAL_TABLET | Freq: Four times a day (QID) | ORAL | Status: DC | PRN
  Administered 2015-12-15 – 2015-12-17 (×4): 650 mg via ORAL
  Filled 2015-12-15 (×4): qty 2

## 2015-12-15 MED ORDER — FUROSEMIDE 10 MG/ML IJ SOLN: 40.0000 mg | Freq: Every day | INTRAMUSCULAR | Status: DC

## 2015-12-15 MED ORDER — OXYCODONE HCL 5 MG PO TABS
5.0000 mg | ORAL_TABLET | ORAL | Status: DC | PRN
  Administered 2015-12-15 – 2015-12-18 (×3): 5 mg via ORAL
  Filled 2015-12-15 (×3): qty 1

## 2015-12-15 MED ORDER — LEVALBUTEROL HCL 0.63 MG/3ML IN NEBU
0.6300 mg | INHALATION_SOLUTION | Freq: Four times a day (QID) | RESPIRATORY_TRACT | Status: DC
  Administered 2015-12-15 – 2015-12-18 (×12): 0.63 mg via RESPIRATORY_TRACT
  Filled 2015-12-15 (×12): qty 3

## 2015-12-15 NOTE — Progress Notes (Signed)
Pt is now on 5 L nasal canula and her oxygen saturations are in the 90's. Pt's mental status has declined this afternoon and has become more lethargic. 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 Pt also did not eat lunch today. Will continue to monitor.

## 2015-12-15 NOTE — Consult Note (Signed)
Consultation Note Date: 12/15/2015   Patient Name: [name]  DOB: 1933-10-16  MRN: [id number]  Age / Sex: 81 y.o., female  PCP: Bennie Pierini, [name] Referring Physician: Eddie North, MD  Reason for Consultation: Establishing goals of care    Clinical Assessment/Narrative:  81 year old female with history of coronary artery disease (remote PCI), COPD on home O2, chronic kidney disease, A. fib on Coumadin resident of skilled nursing facility presented to the ED after sustaining a fall and fractured her right elbow and sustained a mildly displaced fracture of right superior inferior pubic rami on 12-09-15. Patient admitted to hospitalist service and orthopedic surgeon consulted. Patient underwent ORIF on 12-10-15. Hospital course also complicated by volume overload, exacerbation of atrial fibrillation, necessitating transfer to stepdown unit. Patient currently in stepdown unit on nonrebreather mask. Patient unable to participate in physical therapy. Patient also has underlying acute on chronic stage III kidney disease. She remains on vancomycin and Zosyn that was started for pneumonia diagnosed in this hospitalization as well as left lower extremity cellulitis. She is on oral anticoagulation with Coumadin for her atrial fibrillation.  Owing to the patient's advanced age, general debility, deconditioning and decline since hospitalization, palliative consult was requested.  It is reported that the patient has been saying that she believes that she is going to die shortly. No family present at the bedside. It is noted that the patient's nephew Collene Schlichter is her healthcare power of attorney agent.  Chart review shows that the patient has history of coronary artery disease she is status post percutaneous coronary intervention twice. She has underlying atrial fibrillation hypertension and COPD and stage IV chronic  kidney disease. She was admitted to the hospital in October 2016 for COPD exacerbation. Prior to that, earlier this year, in August 2016, patient was admitted for chest pain. She refused cardiac catheterization and elected medical management and conservative measures.  Patient is resting in bed. She is awake. She answers all questions appropriately. She is on the non rebreather mask. She states that at times she does have generalized discomfort including chest discomfort. She feels poorly. She feels short of breath.  Call placed and discussed extensively with nephew healthcare power of attorney agent Collene Schlichter. The patient was living at home. She lives by herself but markedly lives right behind her. Sharl Ma and his wife helped care for the patient. Patient has no spouse no children. Patient recently went to skilled nursing facility for rehabilitation for 21 days. It is reported that she has completed 17 of those days. She unfortunately sustained a fall and elbow fracture over there and had to be hospitalized. Nephew Sharl Ma states that he would not like for the patient to go back to the facility. He states that he recognizes that the patient is not recovering very well since her surgery.  He is asking about options bring the patient home. Discussed about addition of hospice services as an extra layer of support. Discussed about appropriate symptom management. Will discontinue Norco. Will have oxycodone immediate release as well as morphine sulfate IV available on an as-needed basis. Continue bowel and antiemetic regimen. Discussed with nephew that the patient has significantly high oxygen requirements and remains tachycardic. Patient was on 2 L supplemental oxygen via nasal cannula at home. Follow hospital course. Discussed with nephew that the patient might need to be in the hospital for at least 1-2 more days. Meanwhile, case management consultation, to proceed with hospice consultation, begin arrangements for  home with hospice upon discharge.  All questions answered. Thank you for the consult.  Contacts/Participants in Discussion: Primary Decision Maker:   Patient, then her nephew Bryna Colander Relationship to Patient nephew HCPOA: yes   nephew Larita Fife  SUMMARY OF RECOMMENDATIONS: DNR DNI Focus on managing symptoms of pain, discomfort, dyspnea Case management for hospice consult, initiate arrangements for home hospice.  Continue current treatment for now.    Code Status/Advance Care Planning: DNR    Code Status Orders        Start     Ordered   12/09/15 0941  Do not attempt resuscitation (DNR)   Continuous    Question Answer Comment  In the event of cardiac or respiratory ARREST Do not call a "code blue"   In the event of cardiac or respiratory ARREST Do not perform Intubation, CPR, defibrillation or ACLS   In the event of cardiac or respiratory ARREST Use medication by any route, position, wound care, and other measures to relive pain and suffering. May use oxygen, suction and manual treatment of airway obstruction as needed for comfort.      12/09/15 0940      Other Directives:Other  Symptom Management:   Dyspnea, generalized pain: OxyIR and morphine IV when necessary  Palliative Prophylaxis:   Delirium Protocol  Additional Recommendations (Limitations, Scope, Preferences):  maximize symptom management, home with hospice when considered stable for d/C    Psycho-social/Spiritual:  Support System: Strong Desire for further Chaplaincy support:no Additional Recommendations: Education on Hospice  Prognosis: likely weeks  Discharge Planning: Home with Hospice   Chief Complaint/ Primary Diagnoses: Present on Admission:  . Olecranon fracture . Atrial fibrillation (HCC) . Acute on chronic respiratory failure with hypoxia (HCC) . CAD (coronary artery disease), native coronary artery . Gastroesophageal reflux disease without esophagitis . Osteoporosis .  Essential hypertension, benign . COPD (chronic obstructive pulmonary disease) (HCC) . Bilateral lower extremity edema  I have reviewed the medical record, interviewed the patient and family, and examined the patient. The following aspects are pertinent.  Past Medical History  Diagnosis Date  . Hypertension   . CAD (coronary artery disease) 5/96  . Edema   . Hyperlipidemia   . Chronic atrial fibrillation (HCC)     Coumadin therapy   . Osteopenia   . Renal insufficiency   . Closed right ankle fracture   . CHF (congestive heart failure) (HCC)   . LBP (low back pain)   . Arthritis   . Cataract   . COPD (chronic obstructive pulmonary disease) (HCC)     wears O2 at home as needed  . Esophageal stricture   . Esophageal dysmotility 09/2011    seen on barium esophagram.  . Carotid artery occlusion   . Myocardial infarction [facility])    Social History   Social History  . Marital Status: Widowed    Spouse Name: N/A  . Number of Children: N/A  . Years of Education: N/A   Occupational History  . retired    Social History Main Topics  . Smoking status: Former Smoker -- 1.00 packs/day    Types: Cigarettes    Quit date: 10/09/2015  . Smokeless tobacco: Never Used  . Alcohol Use: No     Comment: previous  . Drug Use: No  . Sexual Activity: Not Asked   Other Topics Concern  . None   Social History Narrative   Pt lives alone, niece and nephew live behind her and help out.    Family History  Problem Relation Age of Onset  .  Colon cancer Neg Hx   . Esophageal cancer Neg Hx   . Stomach cancer Neg Hx   . Rectal cancer Neg Hx   . Hyperlipidemia Mother   . Hypertension Mother   . Hyperlipidemia Father   . Heart disease Father     After age 45  . Cancer Sister     Lung  . Hyperlipidemia Sister   . Hypertension Sister    Scheduled Meds: . busPIRone  7.5 mg Oral BID  . citalopram  20 mg Oral Daily  . diltiazem  240 mg Oral Daily  . fluticasone  2 spray Each Nare Daily  .  insulin aspart  0-9 Units Subcutaneous TID WC  . isosorbide mononitrate  30 mg Oral Daily  . levalbuterol  0.63 mg Nebulization Q6H  . loratadine  10 mg Oral Daily  . methylPREDNISolone (SOLU-MEDROL) injection  60 mg Intravenous 3 times per day  . metoprolol  100 mg Oral BID  . pantoprazole  40 mg Oral Daily  . piperacillin-tazobactam (ZOSYN)  IV  3.375 g Intravenous 3 times per day  . polyethylene glycol  17 g Oral Daily  . potassium chloride SA  20 mEq Oral Daily  . vancomycin  1,000 mg Intravenous Q24H  . warfarin  5 mg Oral ONCE-1800  . Warfarin - Pharmacist Dosing Inpatient   Does not apply q1800   Continuous Infusions:  PRN Meds:.albuterol, morphine injection, nitroGLYCERIN, ondansetron **OR** ondansetron (ZOFRAN) IV, oxyCODONE Medications Prior to Admission:  Prior to Admission medications   Medication Sig Start Date End Date Taking? Authorizing Provider  alendronate (FOSAMAX) 70 MG tablet Take 1 tablet (70 mg total) by mouth once a week. Take with a full glass of water on an empty stomach. 09/16/15  Yes Mary-Margaret Daphine Deutscher, [name]  atorvastatin (LIPITOR) 40 MG tablet TAKE 1 TABLET (40 MG TOTAL) BY MOUTH DAILY. 09/16/15  Yes Mary-Margaret Daphine Deutscher, [name]  busPIRone (BUSPAR) 7.5 MG tablet Take 1 tablet (7.5 mg total) by mouth 2 (two) times daily. . 09/24/15  Yes Mechele Claude, MD  citalopram (CELEXA) 20 MG tablet Take 1 tablet (20 mg total) by mouth daily. 10/14/15  Yes Catarina Hartshorn, MD  dexlansoprazole (DEXILANT) 60 MG capsule TAKE 1 CAPSULE (60 MG TOTAL) BY MOUTH DAILY. 09/16/15  Yes Mary-Margaret Daphine Deutscher, [name]  diltiazem (CARDIZEM CD) 360 MG 24 hr capsule Take 1 capsule (360 mg total) by mouth daily. 10/14/15  Yes Catarina Hartshorn, MD  fluticasone (FLONASE) 50 MCG/ACT nasal spray Place 2 sprays into both nostrils daily. 10/14/15  Yes David Tat, MD  Fluticasone-Salmeterol (ADVAIR DISKUS) 250-50 MCG/DOSE AEPB Inhale 1 puff into the lungs 2 (two) times daily. 06/16/15  Yes Mary-Margaret Daphine Deutscher, [name]    glucose blood (ONETOUCH VERIO) test strip Test 1x per day and prn   Dx- e11.9 10/17/15  Yes Mary-Margaret Daphine Deutscher, [name]  HYDROcodone-acetaminophen (NORCO/VICODIN) 5-325 MG per tablet Take 1 tablet by mouth 2 (two) times daily as needed for moderate pain. Patient taking differently: Take 1 tablet by mouth every 6 (six) hours as needed for moderate pain.  09/10/15  Yes Mary-Margaret Daphine Deutscher, [name]  isosorbide mononitrate (IMDUR) 30 MG 24 hr tablet Take 1 tablet (30 mg total) by mouth daily. 11/17/15  Yes Leroy Sea, MD  Lancets [facility] ULTRASOFT) lancets Use as instructed 10/23/15  Yes Mary-Margaret Daphine Deutscher, [name]  loratadine (CLARITIN) 10 MG tablet Take 1 tablet (10 mg total) by mouth daily. 11/18/15  Yes Mary-Margaret Daphine Deutscher, [name]  metFORMIN (GLUMETZA) 500 MG (MOD) 24 hr tablet Take  500 mg by mouth daily with breakfast.   Yes Historical Provider, MD  metoprolol (LOPRESSOR) 100 MG tablet Take 1 tablet (100 mg total) by mouth 2 (two) times daily. 10/06/15  Yes Mary-Margaret Daphine DeutscherMartin, [name]  nitroGLYCERIN (NITROSTAT) 0.4 MG SL tablet Place 1 tablet (0.4 mg total) under the tongue every 5 (five) minutes as needed for chest pain. 04/10/14  Yes Rollene RotundaJames Hochrein, MD  potassium chloride SA (K-DUR,KLOR-CON) 20 MEQ tablet Take 20 mEq by mouth daily.   Yes Historical Provider, MD  tiotropium (SPIRIVA HANDIHALER) 18 MCG inhalation capsule PLACE 1 CAPSULE (18 MCG TOTAL) INTO INHALER AND INHALE DAILY. 09/16/15  Yes Mary-Margaret Daphine DeutscherMartin, [name]  torsemide (DEMADEX) 20 MG tablet Take 40 mg by mouth daily.   Yes Historical Provider, MD  warfarin (COUMADIN) 5 MG tablet Take 0.5-1 tablets (2.5-5 mg total) by mouth See admin instructions. Takes 1 tablet (5mg ) every Tuesdays and Fridays and 1/2 tablet (2.5 mg) all other days. Patient taking differently: Take 2.5-5 mg by mouth See admin instructions. Takes 1 tablet (5mg ) every Tuesdays, Thursday. 1/2 tablet (2.5 mg) all other days. 09/16/15  Yes Mary-Margaret Daphine DeutscherMartin, [name]   No Known  Allergies  Review of Systems Positive for gradual progressive decline from a functional status standpoint Physical Exam Elderly lady awake on nonrebreather mask Shallow breathing S1-S2 Abdomen soft mild distention Trace edema Awake alert answers few questions appropriately  Vital Signs: BP 156/81 mmHg  Pulse 120  Temp(Src) 100.5 F (38.1 C) (Axillary)  Resp 17  Ht 5' (1.524 m)  Wt 79.7 kg (175 lb 11.3 oz)  BMI 34.32 kg/m2  SpO2 95%  LMP 03/27/1983  SpO2: SpO2: 95 % O2 Device:SpO2: 95 % O2 Flow Rate: .O2 Flow Rate (L/min): 6 L/min  IO: Intake/output summary:   Intake/Output Summary (Last 24 hours) at 12/15/15 1429 Last data filed at 12/15/15 1150  Gross per 24 hour  Intake    100 ml  Output   4625 ml  Net  -4525 ml    LBM: Last BM Date: 12/10/15 Baseline Weight: Weight: 81.5 kg (179 lb 10.8 oz) Most recent weight: Weight: 79.7 kg (175 lb 11.3 oz)      Palliative Assessment/Data:  Flowsheet Rows        Most Recent Value   Intake Tab    Referral Department  Hospitalist   Unit at Time of Referral  Cardiac/Telemetry Unit   Palliative Care Primary Diagnosis  Other (Comment)   Date Notified  12/15/15   Palliative Care Type  New Palliative care   Reason for referral  Clarify Goals of Care   Date of Admission  12/09/15   # of days IP prior to Palliative referral  6   Clinical Assessment    Palliative Performance Scale Score  30%   Pain Max last 24 hours  6   Pain Min Last 24 hours  5   Dyspnea Max Last 24 Hours  5   Dyspnea Min Last 24 hours  4   Psychosocial & Spiritual Assessment    Palliative Care Outcomes       Additional Data Reviewed:  CBC:    Component Value Date/Time   WBC 7.1 12/14/2015 0435   WBC 14.6* 09/24/2015 1551   WBC 10.8* 06/14/2014 1414   HGB 9.4* 12/14/2015 0435   HGB 15.3 07/08/2014 1154   HCT 29.2* 12/14/2015 0435   HCT 51.0* 09/24/2015 1551   HCT 51.2* 06/14/2014 1414   PLT 134* 12/14/2015 0435   MCV 93.3 12/14/2015 0435  MCV 90.3 06/14/2014 1414   NEUTROABS 10.1* 12/09/2015 0534   NEUTROABS 13.4* 09/24/2015 1551   LYMPHSABS 0.7 12/09/2015 0534   LYMPHSABS 0.6* 09/24/2015 1551   MONOABS 0.5 12/09/2015 0534   EOSABS 0.2 12/09/2015 0534   EOSABS 0.1 10/28/2014 1040   BASOSABS 0.0 12/09/2015 0534   BASOSABS 0.0 09/24/2015 1551   Comprehensive Metabolic Panel:    Component Value Date/Time   NA 135 12/15/2015 0300   NA 137 10/16/2015 1042   K 3.6 12/15/2015 0300   CL 93* 12/15/2015 0300   CO2 29 12/15/2015 0300   BUN 39* 12/15/2015 0300   BUN 34* 10/16/2015 1042   CREATININE 1.73* 12/15/2015 0300   CREATININE 1.13* 06/25/2013 1122   GLUCOSE 160* 12/15/2015 0300   GLUCOSE 162* 10/16/2015 1042   CALCIUM 8.0* 12/15/2015 0300   AST 20 11/12/2015 0241   ALT 13* 11/12/2015 0241   ALKPHOS 43 11/12/2015 0241   BILITOT 0.8 11/12/2015 0241   BILITOT 0.3 09/24/2015 1551   PROT 4.9* 11/12/2015 0241   PROT 6.3 09/24/2015 1551   ALBUMIN 2.7* 11/12/2015 0241   ALBUMIN 3.9 09/24/2015 1551     Time In: 1200 Time Out: 1300 Time Total: 60 Greater than 50%  of this time was spent counseling and coordinating care related to the above assessment and plan.  Signed by: Rosalin Hawking, MD [id number] Rosalin Hawking, MD  12/15/2015, 2:29 PM  Please contact Palliative Medicine Team phone at [phone] for questions and concerns.

## 2015-12-15 NOTE — Progress Notes (Addendum)
[facility] PROGRESS NOTE  JANISA LABUS ZOX:[id number] DOB: June 20, 1933 DOA: 12/09/2015 PCP: Bennie Pierini, [name]  brief narrative 81 year old female with history of coronary artery disease (remote PCI), COPD on home O2, chronic kidney disease, A. fib on Coumadin resumed of skilled nursing facility presented to the ED after sustaining a fall and fractured her right elbow and sustained a mildly displaced fracture of right superior inferior pubic rami. Patient admitted to hospitalist service and orthopedic surgeon consulted.  Assessment/Plan: Acute on chronic hypoxic respiratory failure Likely a combination of acute diastolic CHF and COPD exacerbation. - Last echo from 07/2015 shows EF of  50-55% with grade 3 diastolic dysfunction.. -Patient had worsening shortness of breath postop and transfer to stepdown unit.  Chest x ray showed persistent vascular congestion. Lasix increased , added solumedrol and scheduled nebs. Continue to require NRB and having fever with follow-up chest x-ray showing right lower lobe pneumonia. Added empiric vancomycin and Zosyn. -Patient now dehydrated and tachycardic. Discontinue Lasix.  Hospital Acquired pneumonia As above. Empiric antibiotics. Follow culture.  Right olecranon fracture ORIF on 12/14. Transferred to stepdown unit given shortness of breath. Pain control as needed. Orthopedics ordered new fiberglass splint. Start physical therapy when patient able. Recommend partial weightbearing (50%) on both the right upper and lower extremities. Added bowel regimen. Plan on skilled nursing facility upon discharge.  Right pelvic and acetabular fracture Recommend partial weightbearing (50%) on right side.    Acute on chronic kidney disease stage III Renal function worsened due to IV Lasix. Monitor closely. Avoid nephrotoxins.  Paroxysmal A. fib Coumadin held on admission and given vitamin K. Resumed warfarin postop. Continue metoprolol and  Cardizem.  Left lower leg cellulitis On empiric IV clindamycin. Switch to vancomycin and zosyn for broader coverage.   GERD Continue PPI  Type 2 diabetes mellitus Monitor with sliding scale coverage.  Depression Stable. Continue home medications.   DVT prophylaxis: Coumadin  Diet: Diabetic  Code Status: DNR Family Communication: None at bedside. Family informed the nurse and they wished on having a palliative care consult. Consult placed Disposition Plan: Continue stepdown monitoring. Poor improvement in her respiratory function.   Consultants:  Orthopedics (Dr. Luiz Blare)  Procedures:  ORIF of right elbow on 12/14  Antibiotics:  Clindamycin since 12/13  HPI/Subjective: Patient continues to be in respiratory failure requiring NRB. Appears dehydrated and tachycardic. Receives some IV fluids overnight.  Objective: Filed Vitals:   12/15/15 0708 12/15/15 1140  BP:    Pulse:    Temp: 98.8 F (37.1 C) 100.5 F (38.1 C)  Resp:      Intake/Output Summary (Last 24 hours) at 12/15/15 1254 Last data filed at 12/15/15 1150  Gross per 24 hour  Intake    100 ml  Output   4625 ml  Net  -4525 ml   Filed Weights   12/13/15 0423 12/14/15 0355 12/15/15 0500  Weight: 81.8 kg (180 lb 5.4 oz) 80.8 kg (178 lb 2.1 oz) 79.7 kg (175 lb 11.3 oz)    Exam:   General:  On NRB  HEENT:  Dry oral mucosa, supple neck  Chest:  Improved breath sounds bilaterally  CVS: S1 and S2 irregular, no murmurs rub or gallop  GI: Soft, nondistended, nontender, bowel sounds present,   Musculoskeletal: Limited mobility of the right hip, dressing Over right elbow, 1+ pitting edema bilaterally ( improved). Left leg cellulitis improved.  CNS: Alert and oriented  Data Reviewed: Basic Metabolic Panel:  Recent Labs Lab 12/11/15 0124 12/11/15 0818 12/13/15 8119  12/14/15 0435 12/15/15 0300  NA 139 140 136 136 135  K 4.7 4.7 4.7 4.3 3.6  CL 99* 98* 97* 95* 93*  CO2 33* GLUCOSE 104* 81 127* 146* 160*  BUN 27* 26* 37* 36* 39*  CREATININE 1.64* 1.53* 2.24* 1.95* 1.73*  CALCIUM 8.0* 8.2* 7.9* 8.0* 8.0*   Liver Function Tests: No results for input(s): AST, ALT, ALKPHOS, BILITOT, PROT, ALBUMIN in the last 168 hours. No results for input(s): LIPASE, AMYLASE in the last 168 hours. No results for input(s): AMMONIA in the last 168 hours. CBC:  Recent Labs Lab 12/09/15 0534 12/10/15 0349 12/11/15 0124 12/11/15 0818 12/13/15 0459 12/14/15 0435  WBC 11.6* 6.1 9.1 8.7 7.0 7.1  NEUTROABS 10.1*  --   --   --   --   --   HGB 10.9* 9.9* 8.9* 9.3* 8.5* 9.4*  HCT 35.7* 32.4* 29.2* 30.0* 27.6* 29.2*  MCV 96.2 94.7 95.1 94.6 94.8 93.3  PLT 194 170 145* 155 118* 134*   Cardiac Enzymes:  Recent Labs Lab 12/11/15 0124 12/11/15 0818 12/11/15 1250  TROPONINI 0.04* 0.05* 0.03   BNP (last 3 results)  Recent Labs  11/13/15 0845 12/09/15 1205 12/11/15 0124  BNP 882.8* 1201.2* 739.1*    ProBNP (last 3 results) No results for input(s): PROBNP in the last 8760 hours.  CBG:  Recent Labs Lab 12/14/15 0827 12/14/15 1245 12/14/15 1602 12/14/15 2103 12/15/15 0812  GLUCAP 141* 182* 152* 165* 140*    Recent Results (from the past 240 hour(s))  Surgical pcr screen     Status: None   Collection Time: 12/10/15  3:15 PM  Result Value Ref Range Status   MRSA, PCR NEGATIVE NEGATIVE Final   Staphylococcus aureus NEGATIVE NEGATIVE Final    Comment:        The Xpert SA Assay (FDA approved for NASAL specimens in patients over 72 years of age), is one component of a comprehensive surveillance program.  Test performance has been validated by [facility] for patients greater than or equal to 67 year old. It is not intended to diagnose infection nor to guide or monitor treatment.   Culture, Urine     Status: None   Collection Time: 12/14/15  8:36 AM  Result Value Ref Range Status   Specimen Description URINE, CATHETERIZED  Final   Special Requests  NONE  Final   Culture NO GROWTH 1 DAY  Final   Report Status 12/15/2015 FINAL  Final  Culture, blood (routine x 2)     Status: None (Preliminary result)   Collection Time: 12/14/15  9:00 AM  Result Value Ref Range Status   Specimen Description BLOOD LEFT ANTECUBITAL  Final   Special Requests BOTTLES DRAWN AEROBIC AND ANAEROBIC 10CC  Final   Culture NO GROWTH 1 DAY  Final   Report Status PENDING  Incomplete  Culture, blood (routine x 2)     Status: None (Preliminary result)   Collection Time: 12/14/15  9:15 AM  Result Value Ref Range Status   Specimen Description BLOOD BLOOD LEFT FOREARM  Final   Special Requests BOTTLES DRAWN AEROBIC AND ANAEROBIC 10CC  Final   Culture NO GROWTH 1 DAY  Final   Report Status PENDING  Incomplete     Studies: Dg Chest Port 1 View  12/14/2015  CLINICAL DATA:  Sepsis.  Fever. EXAM: PORTABLE CHEST - 1 VIEW COMPARISON:  One-view chest x-ray 12/13/2015. FINDINGS: The heart size is normal. Atherosclerotic calcifications are  present in the aorta. New right lower lobe airspace disease is present. Mild pulmonary vascular congestion is noted. IMPRESSION: 1. New right lower lobe pneumonia. 2. Cardiomegaly and mild pulmonary vascular congestion. 3. Atherosclerosis of the thoracic aorta. Electronically Signed   By: Marin Robertshristopher  Mattern M.D.   On: 12/14/2015 13:32    Scheduled Meds: . busPIRone  7.5 mg Oral BID  . citalopram  20 mg Oral Daily  . diltiazem  240 mg Oral Daily  . fluticasone  2 spray Each Nare Daily  . insulin aspart  0-9 Units Subcutaneous TID WC  . isosorbide mononitrate  30 mg Oral Daily  . levalbuterol  0.63 mg Nebulization Q6H  . loratadine  10 mg Oral Daily  . methylPREDNISolone (SOLU-MEDROL) injection  60 mg Intravenous 3 times per day  . metoprolol  100 mg Oral BID  . pantoprazole  40 mg Oral Daily  . piperacillin-tazobactam (ZOSYN)  IV  3.375 g Intravenous 3 times per day  . polyethylene glycol  17 g Oral Daily  . potassium chloride SA   20 mEq Oral Daily  . vancomycin  1,000 mg Intravenous Q24H  . Warfarin - Pharmacist Dosing Inpatient   Does not apply q1800   Continuous Infusions:     Time spent: 35 minutes    Icarus Partch  [facility] Pager [phone] If 7PM-7AM, please contact night-coverage at [email or web address], password [email or web address] 12/15/2015, 12:54 PM  LOS: 6 days

## 2015-12-15 NOTE — Progress Notes (Signed)
Pt noted to have elevated HR between 115-125 consistently the last 3-4 hours. Breathing treatments switched from Duoneb to Xopenex. RN will continue to monitor.

## 2015-12-15 NOTE — Progress Notes (Signed)
Physical Therapy Treatment and Discharge Patient Details Name: Katie FergusonDessie A Melendez MRN: [id number] DOB: December 29, 1932 Today's Date: 12/15/2015    History of Present Illness pt presents with fall sustaining R Olecranon fx s/p ORIF and R pelvic/acetabular fxs.  pt with hx of COPD, A-fib, DM, Depression, HTN, CAD, CKD, CHF, MI, and Esophageal Dysmotility.    PT Comments    [name] was resistant to participating in therapy this session and refuses to open her eyes.  Currently requiring +2 max A for bed mobility.  Palliative care is recommending hospice at d/c and family in agreement.  PT is signing off at this time as pt transitions toward comfort care.  Please place new PT order if pt becomes appropriate.     Follow Up Recommendations  SNF     Equipment Recommendations  None recommended by PT    Recommendations for Other Services       Precautions / Restrictions Precautions Precautions: Fall Required Braces or Orthoses: Sling Restrictions Weight Bearing Restrictions: Yes RUE Weight Bearing: Partial weight bearing (WBAT w/ plaftform RW during transfers) RUE Partial Weight Bearing Percentage or Pounds: 50 RLE Weight Bearing: Partial weight bearing RLE Partial Weight Bearing Percentage or Pounds: 50 LLE Weight Bearing: Partial weight bearing LLE Partial Weight Bearing Percentage or Pounds: 50 Other Position/Activity Restrictions: most recent ortho note: WBAT Rt UE for transfers using platform RW    Mobility  Bed Mobility Overal bed mobility: Needs Assistance;+2 for physical assistance Bed Mobility: Supine to Sit;Sit to Supine     Supine to sit: Max assist;+2 for physical assistance;HOB elevated Sit to supine: Max assist;+2 for physical assistance   General bed mobility comments: Management of Bil LEs, use of bed pad, and max support of trunk to achieve sitting EOB.  Pt assists very minimally.  Transfers                 General transfer comment: unable to attempt this  session  Ambulation/Gait                 Stairs            Wheelchair Mobility    Modified Rankin (Stroke Patients Only)       Balance Overall balance assessment: Needs assistance Sitting-balance support: Single extremity supported;Feet supported Sitting balance-Leahy Scale: Poor Sitting balance - Comments: mod>total A to maintain upright w/ strong posterior lean.                             Cognition Arousal/Alertness: Lethargic Behavior During Therapy: Flat affect;Agitated Overall Cognitive Status: Difficult to assess                      Exercises      General Comments General comments (skin integrity, edema, etc.): Throughout session question pt's effort given.  When asked if she could open her eyes her response was, "I don't have any".  When asked what her first name was her response was, "I don't have one".  Continues to say "I'm trying" but will not actively participate in therapy.      Pertinent Vitals/Pain Pain Assessment: Faces Faces Pain Scale: Hurts even more Pain Location: "all over" Pain Descriptors / Indicators: Grimacing;Moaning Pain Intervention(s): Limited activity within patient's tolerance;Monitored during session;Repositioned    Home Living                      Prior Function  PT Goals (current goals can now be found in the care plan section) Acute Rehab PT Goals Patient Stated Goal: none stated Progress towards PT goals: Not progressing toward goals - comment (refusing therapeutic interventins, hospice at d/c)    Frequency   (signing off)    PT Plan Frequency needs to be updated    Co-evaluation             End of Session Equipment Utilized During Treatment: Oxygen Activity Tolerance: Patient limited by fatigue;Treatment limited secondary to agitation;Patient limited by pain;Patient limited by lethargy Patient left: in bed;with call bell/phone within reach     Time:  1325-1344 PT Time Calculation (min) (ACUTE ONLY): 19 min  Charges:  $Therapeutic Activity: 8-22 mins                    G Codes:      Michail Jewels PT, [place] [phone] Pager: [phone] 12/15/2015, 3:55 PM

## 2015-12-15 NOTE — Consult Note (Signed)
ANTICOAGULATION CONSULT NOTE - Follow up Consult  Pharmacy Consult for warfarin Indication: atrial fibrillation  No Known Allergies  Patient Measurements: Height: 5' (152.4 cm) Weight: 175 lb 11.3 oz (79.7 kg) IBW/kg (Calculated) : 45.5  Labs:  Recent Labs  12/13/15 0459 12/14/15 0435 12/15/15 0300  HGB 8.5* 9.4*  --   HCT 27.6* 29.2*  --   PLT 118* 134*  --   LABPROT 18.9* 19.0* 19.6*  INR 1.58* 1.59* 1.66*  CREATININE 2.24* 1.95* 1.73*   Assessment: Pt fell POD ORIF of R olecranon fracture. Pt was on warfarin PTA for AFib. Warfarin restarted on 12/15. Unknown if 12/16 dose was given since not documented. INR 1.6 today  PTA warfarin: 5 mg qTues/Thurs, 2.5 mg all other days  Goal of Therapy:  INR 2-3 Monitor platelets by anticoagulation protocol: Yes   Plan: 1. Warfarin 5 mg po x1 2. Daily INR 3. Monitor s/sx bleeding  Pollyann SamplesAndy Jonquil Stubbe, PharmD, BCPS 12/15/2015, 2:25 PM Pager: [phone]

## 2015-12-16 ENCOUNTER — Other Ambulatory Visit: Payer: Self-pay | Admitting: Licensed Clinical Social Worker

## 2015-12-16 DIAGNOSIS — Z7189 Other specified counseling: Secondary | ICD-10-CM | POA: Insufficient documentation

## 2015-12-16 DIAGNOSIS — J189 Pneumonia, unspecified organism: Secondary | ICD-10-CM | POA: Insufficient documentation

## 2015-12-16 DIAGNOSIS — Y95 Nosocomial condition: Secondary | ICD-10-CM | POA: Insufficient documentation

## 2015-12-16 LAB — GLUCOSE, CAPILLARY
Glucose-Capillary: 115 mg/dL — ABNORMAL HIGH (ref 65–99)
Glucose-Capillary: 203 mg/dL — ABNORMAL HIGH (ref 65–99)
Glucose-Capillary: 152 mg/dL — ABNORMAL HIGH (ref 65–99)
Glucose-Capillary: 125 mg/dL — ABNORMAL HIGH (ref 65–99)
Glucose-Capillary: 149 mg/dL — ABNORMAL HIGH (ref 65–99)

## 2015-12-16 LAB — PROTIME-INR
INR: 2.41 — ABNORMAL HIGH (ref 0.00–1.49)
Prothrombin Time: 25.9 seconds — ABNORMAL HIGH (ref 11.6–15.2)

## 2015-12-16 MED ORDER — DILTIAZEM HCL ER COATED BEADS 300 MG PO CP24
300.0000 mg | ORAL_CAPSULE | Freq: Every day | ORAL | Status: DC
  Administered 2015-12-16 – 2015-12-18 (×3): 300 mg via ORAL
  Filled 2015-12-16 (×3): qty 1

## 2015-12-16 MED ORDER — WARFARIN SODIUM 5 MG PO TABS
2.5000 mg | ORAL_TABLET | Freq: Once | ORAL | Status: DC
  Filled 2015-12-16: qty 1

## 2015-12-16 NOTE — Progress Notes (Signed)
ANTICOAGULATION and ANTIBIOTIC CONSULT NOTE - Follow Up Consult  Pharmacy Consult for Coumadin and Vancomycin and Zosyn Indication: atrial fibrillation and sepsis/pneumonia coverage  No Known Allergies  Patient Measurements: Height: 5' (152.4 cm) Weight: 175 lb 11.3 oz (79.7 kg) IBW/kg (Calculated) : 45.5  Vital Signs: Temp: 97.8 F (36.6 C) (12/20 1146) Temp Source: Oral (12/20 1146) BP: 134/91 mmHg (12/20 0848) Pulse Rate: 102 (12/20 0848)  Labs:  Recent Labs  12/14/15 0435 12/15/15 0300 12/16/15 0418  HGB 9.4*  --   --   HCT 29.2*  --   --   PLT 134*  --   --   LABPROT 19.0* 19.6* 25.9*  INR 1.59* 1.66* 2.41*  CREATININE 1.95* 1.73*  --     Estimated Creatinine Clearance: 24.2 mL/min (by C-G formula based on Cr of 1.73).  Assessment:  On Coumadin prior to admission for afib admitted 12/13 with right elbow fracture.  INR 2.01 on admit; reversed with Vitamin K 2mg  PO on 2/13.    ORIF right elbow 12/14. Coumadin resumed 12/15.  INR is back into therapeutic range (2.41), but up significantly from yesterday (1.66).   Has had 5 mg, 7.5 mg, then 5 mg over the last 3 days, so may still rise by am.  Home regimen: 5 mg Tues/Thurs, 2.5 mg all other days.      Day # 3 Vanc and Zosyn for sepsis and pneumonia after 5 days of Clindamycin for LLE cellulitis.  Cultures negative to date.   Tmax 101.1, WBC 7.1 on 12/18. Baseline creatinine 1.58, but max 2.24 on 12/17.   Scr 1.95 on 12/18 when Vanc/Zosyn begun, 1.73 yesterday.    Goal of Therapy:  INR 2-3 Monitor platelets by anticoagulation protocol: Yes Vancomycin troughs 15-20 mcg/ml Appropriate Zosyn dose for renal function and infection   Plan:   Coumadin 2.5 mg today.  Daily PT/INR.   Continue Vancomycin 1gm IV q24hrs.  Continue Zosyn 3.375 gm IV q8hrs (each over 4 hrs).  Bmet and CBC in am.  Will consider checking Vanc trough level this week if Vanc to continue.  Follow renal function, culture data,  progression.  Dennie Fettersgan, Jason Hauge [place], [place] Pager: [phone] 12/16/2015,3:04 PM

## 2015-12-16 NOTE — Progress Notes (Addendum)
[facility] PROGRESS NOTE  MYKELL GENAO JSH:[id number] DOB: 01-15-1933 DOA: 12/09/2015 PCP: Chevis Pretty, [name]  brief narrative 81 year old female with history of coronary artery disease (remote PCI), COPD on home O2, chronic kidney disease, A. fib on Coumadin resumed of skilled nursing facility presented to the ED after sustaining a fall and fractured her right elbow and sustained a mildly displaced fracture of right superior inferior pubic rami. Patient admitted to hospitalist service.  Assessment/Plan: Acute on chronic hypoxic respiratory failure Likely a combination of acute diastolic CHF and COPD exacerbation. - Last echo from 07/2015 shows EF of  50-55% with grade 3 diastolic dysfunction.. -Patient had worsening shortness of breath postop and transfer to stepdown unit.  Chest x ray showed persistent vascular congestion.  added solumedrol and scheduled nebs. Continue to require NRB followed by high o2 requirement. Has persistent fever. follow-up chest x-ray showing right lower lobe pneumonia. Added empiric vancomycin and Zosyn. -Patient now dehydrated and tachycardic. Discontinued Lasix. -symptoms unimproved despite adequate management. [palliaitve care met with family and wished for minimal intervention.  family inclined towards home with hospice.  Hospital Acquired pneumonia As above. Empiric antibiotics. cx negative. Supportive care.  Right olecranon fracture ORIF on 12/14. Transferred to stepdown unit given shortness of breath. Pain control as needed. Orthopedics ordered new fiberglass splint. Pt unable to tolerate PT.  Recommend partial weightbearing (50%) on both the right upper and lower extremities. Added bowel regimen.   Right pelvic and acetabular fracture Recommend partial weightbearing (50%) on right side.    Acute on chronic kidney disease stage III Renal function worsened due to IV Lasix. Monitor closely. Avoid nephrotoxins.  Paroxysmal A.  fib Coumadin held on admission and given vitamin K. Resumed warfarin postop. Continue metoprolol and Cardizem.  Left lower leg cellulitis On empiric IV clindamycin. Switch to vancomycin and zosyn for broader coverage.  resolved.   GERD Continue PPI  Type 2 diabetes mellitus Monitor with sliding scale coverage.  Depression Stable. Continue home medications.   DVT prophylaxis: Coumadin  Diet: Diabetic  Code Status: DNR Family Communication: None at bedside. Spoke with nephew Jerrye Beavers on the phone today.  Disposition Plan: Continue stepdown monitoring for today. If stable on [place] can be transferred to medical floor tomorrow. Nephew will try to have everything set up for home hospice by Thursday. D/c home possibly on 12/22 .   Consultants:  Orthopedics (Dr. Berenice Primas)  palliative care  Procedures:  ORIF of right elbow on 12/14  Antibiotics:  Clindamycin since 12/13-12/18  vanco and zosyn 12/18---  HPI/Subjective: Patient continues to be in respiratory failure requiring high o2. Still febrile and tachycardic  Objective: Filed Vitals:   12/16/15 0848 12/16/15 1146  BP: 134/91   Pulse: 102   Temp:  97.8 F (36.6 C)  Resp: 21     Intake/Output Summary (Last 24 hours) at 12/16/15 1447 Last data filed at 12/16/15 1354  Gross per 24 hour  Intake   1020 ml  Output   1675 ml  Net   -655 ml   Filed Weights   12/14/15 0355 12/15/15 0500 12/16/15 0359  Weight: 80.8 kg (178 lb 2.1 oz) 79.7 kg (175 lb 11.3 oz) 79.7 kg (175 lb 11.3 oz)    Exam:   General:  On 5 L via West   HEENT:  Dry oral mucosa, supple neck  Chest:  Improved breath sounds bilaterally  CVS: S1 and S2 irregular, no murmurs rub or gallop  GI: Soft, nondistended, nontender, bowel sounds present,  Musculoskeletal: Limited mobility of the right hip, dressing Over right elbow, trace  edema bilaterally ( improved). Left leg cellulitis resolved. foley placed this admission   CNS: sleepy but  oriented  Data Reviewed: Basic Metabolic Panel:  Recent Labs Lab 12/11/15 0124 12/11/15 0818 12/13/15 0459 12/14/15 0435 12/15/15 0300  NA 139 140 136 136 135  K 4.7 4.7 4.7 4.3 3.6  CL 99* 98* 97* 95* 93*  CO2 33* 31 31 31 29   GLUCOSE 104* 81 127* 146* 160*  BUN 27* 26* 37* 36* 39*  CREATININE 1.64* 1.53* 2.24* 1.95* 1.73*  CALCIUM 8.0* 8.2* 7.9* 8.0* 8.0*   Liver Function Tests: No results for input(s): AST, ALT, ALKPHOS, BILITOT, PROT, ALBUMIN in the last 168 hours. No results for input(s): LIPASE, AMYLASE in the last 168 hours. No results for input(s): AMMONIA in the last 168 hours. CBC:  Recent Labs Lab 12/10/15 0349 12/11/15 0124 12/11/15 0818 12/13/15 0459 12/14/15 0435  WBC 6.1 9.1 8.7 7.0 7.1  HGB 9.9* 8.9* 9.3* 8.5* 9.4*  HCT 32.4* 29.2* 30.0* 27.6* 29.2*  MCV 94.7 95.1 94.6 94.8 93.3  PLT 170 145* 155 118* 134*   Cardiac Enzymes:  Recent Labs Lab 12/11/15 0124 12/11/15 0818 12/11/15 1250  TROPONINI 0.04* 0.05* 0.03   BNP (last 3 results)  Recent Labs  11/13/15 0845 12/09/15 1205 12/11/15 0124  BNP 882.8* 1201.2* 739.1*    ProBNP (last 3 results) No results for input(s): PROBNP in the last 8760 hours.  CBG:  Recent Labs Lab 12/15/15 1703 12/15/15 2110 12/16/15 0901 12/16/15 1144 12/16/15 1349  GLUCAP 145* 135* 115* 203* 152*    Recent Results (from the past 240 hour(s))  Surgical pcr screen     Status: None   Collection Time: 12/10/15  3:15 PM  Result Value Ref Range Status   MRSA, PCR NEGATIVE NEGATIVE Final   Staphylococcus aureus NEGATIVE NEGATIVE Final    Comment:        The Xpert SA Assay (FDA approved for NASAL specimens in patients over 102 years of age), is one component of a comprehensive surveillance program.  Test performance has been validated by [facility] for patients greater than or equal to 59 year old. It is not intended to diagnose infection nor to guide or monitor treatment.   Culture, Urine      Status: None   Collection Time: 12/14/15  8:36 AM  Result Value Ref Range Status   Specimen Description URINE, CATHETERIZED  Final   Special Requests NONE  Final   Culture NO GROWTH 1 DAY  Final   Report Status 12/15/2015 FINAL  Final  Culture, blood (routine x 2)     Status: None (Preliminary result)   Collection Time: 12/14/15  9:00 AM  Result Value Ref Range Status   Specimen Description BLOOD LEFT ANTECUBITAL  Final   Special Requests BOTTLES DRAWN AEROBIC AND ANAEROBIC 10CC  Final   Culture NO GROWTH 2 DAYS  Final   Report Status PENDING  Incomplete  Culture, blood (routine x 2)     Status: None (Preliminary result)   Collection Time: 12/14/15  9:15 AM  Result Value Ref Range Status   Specimen Description BLOOD BLOOD LEFT FOREARM  Final   Special Requests BOTTLES DRAWN AEROBIC AND ANAEROBIC 10CC  Final   Culture NO GROWTH 2 DAYS  Final   Report Status PENDING  Incomplete     Studies: No results found.  Scheduled Meds: . busPIRone  7.5 mg Oral BID  .  citalopram  20 mg Oral Daily  . diltiazem  300 mg Oral Daily  . fluticasone  2 spray Each Nare Daily  . insulin aspart  0-9 Units Subcutaneous TID WC  . isosorbide mononitrate  30 mg Oral Daily  . levalbuterol  0.63 mg Nebulization Q6H  . loratadine  10 mg Oral Daily  . methylPREDNISolone (SOLU-MEDROL) injection  60 mg Intravenous 3 times per day  . metoprolol  100 mg Oral BID  . pantoprazole  40 mg Oral Daily  . piperacillin-tazobactam (ZOSYN)  IV  3.375 g Intravenous 3 times per day  . polyethylene glycol  17 g Oral Daily  . potassium chloride SA  20 mEq Oral Daily  . vancomycin  1,000 mg Intravenous Q24H  . Warfarin - Pharmacist Dosing Inpatient   Does not apply q1800   Continuous Infusions:     Time spent: 25 minutes    Louellen Molder  [facility] Pager [phone] If 7PM-7AM, please contact night-coverage at [email or web address], password [facility] 12/16/2015, 2:47 PM  LOS: 7 days

## 2015-12-16 NOTE — Progress Notes (Signed)
Daily Progress Note   Patient Name: [name]       Date: 12/16/2015 DOB: 19-Jul-1933  Age: 81 y.o. MRN#: [id number] Attending Physician: Eddie NorthNishant Dhungel, MD Primary Care Physician: Bennie PieriniMARTIN,[name], [name] Admit Date: 12/09/2015  Reason for Consultation/Follow-up: Establishing goals of care  Subjective:  awake alert resting in bed attempting to feed self breakfast  Interval Events: Was lethargic yesterday high O2 requirements.  Patient with high risk for ongoing clinical decompensation: has not been able to participate well in PT since surgery, has A fib and PNA/ volume overload. Patient with underlying multiple co morbidities of COPD CAD DM CKD etc.  Would recommend residential hospice. Discussed with case manager on the floor who will notify [name] nephew HCPOA  Length of Stay: 7 days  Current Medications: Scheduled Meds:  . busPIRone  7.5 mg Oral BID  . citalopram  20 mg Oral Daily  . diltiazem  300 mg Oral Daily  . fluticasone  2 spray Each Nare Daily  . insulin aspart  0-9 Units Subcutaneous TID WC  . isosorbide mononitrate  30 mg Oral Daily  . levalbuterol  0.63 mg Nebulization Q6H  . loratadine  10 mg Oral Daily  . methylPREDNISolone (SOLU-MEDROL) injection  60 mg Intravenous 3 times per day  . metoprolol  100 mg Oral BID  . pantoprazole  40 mg Oral Daily  . piperacillin-tazobactam (ZOSYN)  IV  3.375 g Intravenous 3 times per day  . polyethylene glycol  17 g Oral Daily  . potassium chloride SA  20 mEq Oral Daily  . vancomycin  1,000 mg Intravenous Q24H  . Warfarin - Pharmacist Dosing Inpatient   Does not apply q1800    Continuous Infusions:    PRN Meds: acetaminophen, albuterol, morphine injection, nitroGLYCERIN, ondansetron **OR** ondansetron (ZOFRAN) IV,  oxyCODONE  Physical Exam: Physical Exam             Confused Appears to be resting currently Edema Bruises extremities.  Detailed physical exam not performed, patient eating breakfast  Vital Signs: BP 134/91 mmHg  Pulse 102  Temp(Src) 100.1 F (37.8 C) (Oral)  Resp 21  Ht 5' (1.524 m)  Wt 79.7 kg (175 lb 11.3 oz)  BMI 34.32 kg/m2  SpO2 91%  LMP 03/27/1983 SpO2: SpO2: 91 % O2 Device: O2 Device: Nasal Cannula  O2 Flow Rate: O2 Flow Rate (L/min): 6 L/min  Intake/output summary:  Intake/Output Summary (Last 24 hours) at 12/16/15 1011 Last data filed at 12/16/15 0900  Gross per 24 hour  Intake    830 ml  Output   2000 ml  Net  -1170 ml   LBM: Last BM Date: 12/16/15 Baseline Weight: Weight: 81.5 kg (179 lb 10.8 oz) Most recent weight: Weight: 79.7 kg (175 lb 11.3 oz)       Palliative Assessment/Data: Flowsheet Rows        Most Recent Value   Intake Tab    Referral Department  Hospitalist   Unit at Time of Referral  Cardiac/Telemetry Unit   Palliative Care Primary Diagnosis  Other (Comment)   Date Notified  12/15/15   Palliative Care Type  New Palliative care   Reason for referral  Clarify Goals of Care   Date of Admission  12/09/15   # of days IP prior to Palliative referral  6   Clinical Assessment    Palliative Performance Scale Score  30%   Pain Max last 24 hours  6   Pain Min Last 24 hours  5   Dyspnea Max Last 24 Hours  5   Dyspnea Min Last 24 hours  4   Psychosocial & Spiritual Assessment    Palliative Care Outcomes       Additional Data Reviewed: CBC    Component Value Date/Time   WBC 7.1 12/14/2015 0435   WBC 14.6* 09/24/2015 1551   WBC 10.8* 06/14/2014 1414   RBC 3.13* 12/14/2015 0435   RBC 5.58* 09/24/2015 1551   RBC 5.7* 06/14/2014 1414   HGB 9.4* 12/14/2015 0435   HGB 15.3 07/08/2014 1154   HCT 29.2* 12/14/2015 0435   HCT 51.0* 09/24/2015 1551   HCT 51.2* 06/14/2014 1414   PLT 134* 12/14/2015 0435   MCV 93.3 12/14/2015 0435   MCV 90.3  06/14/2014 1414   MCH 30.0 12/14/2015 0435   MCH 30.8 09/24/2015 1551   MCH 29.6 06/14/2014 1414   MCHC 32.2 12/14/2015 0435   MCHC 33.7 09/24/2015 1551   MCHC 32.8 06/14/2014 1414   RDW 15.2 12/14/2015 0435   RDW 14.5 09/24/2015 1551   LYMPHSABS 0.7 12/09/2015 0534   LYMPHSABS 0.6* 09/24/2015 1551   MONOABS 0.5 12/09/2015 0534   EOSABS 0.2 12/09/2015 0534   EOSABS 0.1 10/28/2014 1040   BASOSABS 0.0 12/09/2015 0534   BASOSABS 0.0 09/24/2015 1551    CMP     Component Value Date/Time   NA 135 12/15/2015 0300   NA 137 10/16/2015 1042   K 3.6 12/15/2015 0300   CL 93* 12/15/2015 0300   CO2 29 12/15/2015 0300   GLUCOSE 160* 12/15/2015 0300   GLUCOSE 162* 10/16/2015 1042   BUN 39* 12/15/2015 0300   BUN 34* 10/16/2015 1042   CREATININE 1.73* 12/15/2015 0300   CREATININE 1.13* 06/25/2013 1122   CALCIUM 8.0* 12/15/2015 0300   PROT 4.9* 11/12/2015 0241   PROT 6.3 09/24/2015 1551   ALBUMIN 2.7* 11/12/2015 0241   ALBUMIN 3.9 09/24/2015 1551   AST 20 11/12/2015 0241   ALT 13* 11/12/2015 0241   ALKPHOS 43 11/12/2015 0241   BILITOT 0.8 11/12/2015 0241   BILITOT 0.3 09/24/2015 1551   GFRNONAA 27* 12/15/2015 0300   GFRNONAA 47* 06/25/2013 1122   GFRAA 31* 12/15/2015 0300   GFRAA 54* 06/25/2013 1122       Problem List:  Patient Active Problem List   Diagnosis Date  Noted  . Shortness of breath   . Encounter for palliative care   . Acute renal failure superimposed on stage 3 chronic kidney disease (HCC)   . Olecranon fracture 12/09/2015  . CKD (chronic kidney disease), stage III 12/09/2015  . Depression 12/09/2015  . Pelvic fracture (HCC) 12/09/2015  . Bilateral lower extremity edema 12/09/2015  . Chronic anticoagulation - Coumadin, CHADS2VASC=6 11/13/2015  . Acute on chronic diastolic CHF (congestive heart failure) (HCC) 11/13/2015  . Generalized weakness 11/12/2015  . Cellulitis of right leg 11/12/2015  . Dehydration 11/12/2015  . Depression with anxiety 11/12/2015  .  Leg wound, right 11/05/2015  . Diabetes mellitus without complication (HCC) 10/16/2015  . Hyperkalemia 10/13/2015  . Acute on chronic respiratory failure with hypoxia (HCC) 10/09/2015  . Chronic diastolic CHF (congestive heart failure) (HCC) 10/09/2015  . COPD (chronic obstructive pulmonary disease) (HCC) 09/17/2015  . Acute renal failure superimposed on stage 4 chronic kidney disease (HCC) 09/17/2015  . BMI 30.0-30.9,adult 09/16/2015  . Acute encephalopathy 09/16/2015  . Angina at rest [facility]) 07/29/2015  . Hypothyroidism 10/28/2014  . Gastroesophageal reflux disease without esophagitis 10/28/2014  . GAD (generalized anxiety disorder) 10/28/2014  . Osteoporosis 10/28/2014  . Essential hypertension, benign 03/26/2013  . CAD (coronary artery disease), native coronary artery 03/26/2013  . Hyperlipidemia with target LDL less than 100 03/26/2013  . Atrial fibrillation (HCC) 03/26/2013  . Chronic low back pain 03/26/2013  . Occlusion and stenosis of carotid artery without mention of cerebral infarction 06/08/2012     Palliative Care Assessment & Plan    1.Code Status:  DNR    Code Status Orders        Start     Ordered   12/09/15 0941  Do not attempt resuscitation (DNR)   Continuous    Question Answer Comment  In the event of cardiac or respiratory ARREST Do not call a "code blue"   In the event of cardiac or respiratory ARREST Do not perform Intubation, CPR, defibrillation or ACLS   In the event of cardiac or respiratory ARREST Use medication by any route, position, wound care, and other measures to relive pain and suffering. May use oxygen, suction and manual treatment of airway obstruction as needed for comfort.      12/09/15 0940       2. Goals of Care/Additional Recommendations:   residential hospice, prognosis likely days to weeks at this point.   Limitations on Scope of Treatment: transition towards comfort  Desire for further Chaplaincy  support:no  Psycho-social Needs: Education on Hospice  3. Symptom Management:      1. As above   4. Palliative Prophylaxis:   Delirium Protocol  5. Prognosis: < 2 weeks  6. Discharge Planning:  Hospice facility   Care plan was discussed with  Patient's RN, case manager  Thank you for allowing the Palliative Medicine Team to assist in the care of this patient.   Time In: 0830 Time Out: 0900 Total Time 30 Prolonged Time Billed  no        [phone]  Rosalin Hawking, MD  12/16/2015, 10:11 AM  Please contact Palliative Medicine Team phone at [phone] for questions and concerns.

## 2015-12-16 NOTE — Patient Outreach (Signed)
Assessment:  CSW called client contact number at [facility] on 12/16/15.  CSW was not able to speak with client. CSW did speak with Lequita HaltMorgan, Charity fundraiserN at [facility].  CSW verified identity of Lequita HaltMorgan, Charity fundraiserN at [facility]. CSW and Lequita HaltMorgan spoke of client needs. Lequita HaltMorgan said that client and family were considering discharge options for client.  Lequita HaltMorgan said that she thought that client may go to Hospice Care facility or may decide to return to her home and seek hospice care in the home environment.  Lequita HaltMorgan said that client and her family members were still discussing these discharge options for client at this time. However, currently, client [facility] [place].  Plan: Client and her family to communicate with hospital discharge planner as needed to finalize discharge plan for client. CSW to call client/family representative next week to assess status of client at that time.  Kelton PillarMichael S.Natavia Sublette MSW, LCSW Licensed Clinical Social Worker [facility] Care Management [phone]

## 2015-12-16 NOTE — Clinical Social Work Note (Signed)
CSW received consult for residential hospice placement. Per nurse case manager Gae GallopAngela Cole, [facility]. CSW contacted Bryna ColanderMarty Craig, nephew [phone] to discuss discharge plans. Mr. Tasia CatchingsCraig advised CSW that he wants patient to come home with home Hospice care. Per nephew, [place] [facility] [place].  This information communicated with nurse case manager.   Genelle BalVanessa Tylik Treese, MSW, LCSW Licensed Clinical Social Worker Clinical Social Work Department [facility] [phone]

## 2015-12-16 NOTE — Progress Notes (Signed)
CM received consult for residential hospice per Dr Linna DarnerAnwar. CM called and shared information with Marty(nephew).  CM made referral with CSW for  Residential hospice. CM to f/u with disposition needs. Gae Gallopngela Taima Rada RN,BSN,CM [phone]

## 2015-12-16 NOTE — Care Management Important Message (Signed)
Important Message  Patient Details  Name: Alfredia FergusonDessie A Seifer MRN: [id number] Date of Birth: 1933/10/26   Medicare Important Message Given:  Yes    Kyla BalzarineShealy, Saloni Lablanc Abena 12/16/2015, 4:50 PM

## 2015-12-17 LAB — GLUCOSE, CAPILLARY
Glucose-Capillary: 108 mg/dL — ABNORMAL HIGH (ref 65–99)
Glucose-Capillary: 264 mg/dL — ABNORMAL HIGH (ref 65–99)
Glucose-Capillary: 149 mg/dL — ABNORMAL HIGH (ref 65–99)
Glucose-Capillary: 143 mg/dL — ABNORMAL HIGH (ref 65–99)

## 2015-12-17 LAB — CBC
HCT: 31.8 % — ABNORMAL LOW (ref 36.0–46.0)
Hemoglobin: 10.2 g/dL — ABNORMAL LOW (ref 12.0–15.0)
MCH: 29.6 pg (ref 26.0–34.0)
MCHC: 32.1 g/dL (ref 30.0–36.0)
MCV: 92.2 fL (ref 78.0–100.0)
Platelets: 135 10*3/uL — ABNORMAL LOW (ref 150–400)
RBC: 3.45 MIL/uL — ABNORMAL LOW (ref 3.87–5.11)
RDW: 14.7 % (ref 11.5–15.5)
WBC: 11.5 10*3/uL — ABNORMAL HIGH (ref 4.0–10.5)

## 2015-12-17 LAB — BASIC METABOLIC PANEL
Anion gap: 11 (ref 5–15)
BUN: 47 mg/dL — ABNORMAL HIGH (ref 6–20)
CO2: 28 mmol/L (ref 22–32)
Calcium: 8.3 mg/dL — ABNORMAL LOW (ref 8.9–10.3)
Chloride: 93 mmol/L — ABNORMAL LOW (ref 101–111)
Creatinine, Ser: 1.82 mg/dL — ABNORMAL HIGH (ref 0.44–1.00)
GFR calc Af Amer: 29 mL/min — ABNORMAL LOW (ref >60)
GFR calc non Af Amer: 25 mL/min — ABNORMAL LOW (ref >60)
Glucose, Bld: 129 mg/dL — ABNORMAL HIGH (ref 65–99)
Potassium: 4.4 mmol/L (ref 3.5–5.1)
Sodium: 132 mmol/L — ABNORMAL LOW (ref 135–145)

## 2015-12-17 LAB — PROTIME-INR
INR: 2.52 — ABNORMAL HIGH (ref 0.00–1.49)
Prothrombin Time: 26.8 seconds — ABNORMAL HIGH (ref 11.6–15.2)

## 2015-12-17 MED ORDER — WARFARIN SODIUM 5 MG PO TABS
2.5000 mg | ORAL_TABLET | Freq: Once | ORAL | Status: AC
  Administered 2015-12-17: 2.5 mg via ORAL
  Filled 2015-12-17: qty 1

## 2015-12-17 NOTE — Progress Notes (Addendum)
CM f/u with Marty(nephew/hcpoa)regarding home health hospice services.[name] stated pt wants to go home and 24/7 assistance and supervision will be provided by family. [facility]CM spoke with [facility] and referral made for home health hospice. Demographics and H &P [facility] @ [phone]. Plan to d/c on 12/18/15 after equipment and oxygen have been delivered to home. CM to f/u with disposition needs. Gae Gallopngela Brieann Osinski RN,CM,,BSN [phone]

## 2015-12-17 NOTE — Progress Notes (Signed)
Patient ID: [name], female   DOB: Sep 03, 1933, 81 y.o.   MRN: [id number] Have reviewed chart and feel that pt should be fine for d/c and should f/u [name] 2 weeks to check elbow and r hip and pelvic fx.

## 2015-12-17 NOTE — Progress Notes (Signed)
[facility] PROGRESS NOTE  Yoland A Record MRN:[id number] DOB: 09/09/1933 DOA: 12/09/2015 PCP: MARTIN,[name], [name]   HPI/Subjective: Continues to be febrile, temperature of 101.6 this morning. Per son home with home hospice, According to Dr. Dhungel notes family will be available to take care of her on Thursday. Expected discharge to home with home hospice on Thursday.  brief narrative 81-year-old female with history of coronary artery disease (remote PCI), COPD on home O2, chronic kidney disease, A. fib on Coumadin resumed of skilled nursing facility presented to the ED after sustaining a fall and fractured her right elbow and sustained a mildly displaced fracture of right superior inferior pubic rami. Patient admitted to hospitalist service.  Assessment/Plan: Acute on chronic hypoxic respiratory failure Likely a combination of acute diastolic CHF and COPD exacerbation. - Last echo from 07/2015 shows EF of  50-55% with grade 3 diastolic dysfunction.. -Patient had worsening shortness of breath postop and transfer to stepdown unit.  Chest x ray showed persistent vascular congestion.  added solumedrol and scheduled nebs. Continue to require NRB followed by high o2 requirement. Has persistent fever. follow-up chest x-ray showing right lower lobe pneumonia. Added empiric vancomycin and Zosyn. -Patient now dehydrated and tachycardic. Discontinued Lasix. -symptoms unimproved despite adequate management. [palliaitve care met with family and wished for minimal intervention.  family inclined towards home with hospice. -Per previous notes patient will go home with home hospice, family to arrange for discharge on Thursday.   Hospital Acquired pneumonia As above. Empiric antibiotics. cx negative. Supportive care.  Right olecranon fracture ORIF on 12/14. Transferred to stepdown unit given shortness of breath. Pain control as needed. Orthopedics ordered new fiberglass splint. Pt unable to  tolerate PT.  Recommend partial weightbearing (50%) on both the right upper and lower extremities. Added bowel regimen.  Right pelvic and acetabular fracture Recommend partial weightbearing (50%) on right side.  Acute on chronic kidney disease stage III Renal function worsened due to IV Lasix. Monitor closely. Avoid nephrotoxins.  Paroxysmal A. fib Coumadin held on admission and given vitamin K. Resumed warfarin postop. Continue metoprolol and Cardizem.  Left lower leg cellulitis On empiric IV clindamycin. Switch to vancomycin and zosyn for broader coverage.  resolved.  GERD Continue PPI  Type 2 diabetes mellitus Monitor with sliding scale coverage.  Depression Stable. Continue home medications.   DVT prophylaxis: Coumadin  Diet: Diabetic  Code Status: DNR Family Communication: None at bedside. Spoke with nephew marty on the phone today.  Disposition Plan: Transfer to regular bed . Nephew will try to have everything set up for home hospice by Thursday. D/c home possibly on 12/22 .   Consultants:  Orthopedics (Dr. Graves)  palliative care  Procedures:  ORIF of right elbow on 12/14  Antibiotics:  Clindamycin since 12/13-12/18  vanco and zosyn 12/18---  Objective: Filed Vitals:   12/17/15 0753 12/17/15 1145  BP: 133/90   Pulse: 112   Temp: 101.6 F (38.7 C) 100.4 F (38 C)  Resp: 17     Intake/Output Summary (Last 24 hours) at 12/17/15 1255 Last data filed at 12/17/15 1145  Gross per 24 hour  Intake   1305 ml  Output   1350 ml  Net    -45 ml   Filed Weights   12/15/15 0500 12/16/15 0359 12/17/15 0427  Weight: 79.7 kg (175 lb 11.3 oz) 79.7 kg (175 lb 11.3 oz) 78.2 kg (172 lb 6.4 oz)    Exam:   General:  On 5 L via   HEENT:    Dry oral mucosa, supple neck  Chest:  Improved breath sounds bilaterally  CVS: S1 and S2 irregular, no murmurs rub or gallop  GI: Soft, nondistended, nontender, bowel sounds present,   Musculoskeletal: Limited  mobility of the right hip, dressing Over right elbow, trace  edema bilaterally ( improved). Left leg cellulitis resolved. foley placed this admission   CNS: sleepy but oriented  Data Reviewed: Basic Metabolic Panel:  Recent Labs Lab 12/11/15 0818 12/13/15 0459 12/14/15 0435 12/15/15 0300 12/17/15 0340  NA 140 136 136 135 132*  K 4.7 4.7 4.3 3.6 4.4  CL 98* 97* 95* 93* 93*  CO2 31 31 31 29 28  GLUCOSE 81 127* 146* 160* 129*  BUN 26* 37* 36* 39* 47*  CREATININE 1.53* 2.24* 1.95* 1.73* 1.82*  CALCIUM 8.2* 7.9* 8.0* 8.0* 8.3*   Liver Function Tests: No results for input(s): AST, ALT, ALKPHOS, BILITOT, PROT, ALBUMIN in the last 168 hours. No results for input(s): LIPASE, AMYLASE in the last 168 hours. No results for input(s): AMMONIA in the last 168 hours. CBC:  Recent Labs Lab 12/11/15 0124 12/11/15 0818 12/13/15 0459 12/14/15 0435 12/17/15 0340  WBC 9.1 8.7 7.0 7.1 11.5*  HGB 8.9* 9.3* 8.5* 9.4* 10.2*  HCT 29.2* 30.0* 27.6* 29.2* 31.8*  MCV 95.1 94.6 94.8 93.3 92.2  PLT 145* 155 118* 134* 135*   Cardiac Enzymes:  Recent Labs Lab 12/11/15 0124 12/11/15 0818 12/11/15 1250  TROPONINI 0.04* 0.05* 0.03   BNP (last 3 results)  Recent Labs  11/13/15 0845 12/09/15 1205 12/11/15 0124  BNP 882.8* 1201.2* 739.1*    ProBNP (last 3 results) No results for input(s): PROBNP in the last 8760 hours.  CBG:  Recent Labs Lab 12/16/15 1349 12/16/15 1630 12/16/15 2112 12/17/15 0821 12/17/15 1144  GLUCAP 152* 125* 149* 108* 264*    Recent Results (from the past 240 hour(s))  Surgical pcr screen     Status: None   Collection Time: 12/10/15  3:15 PM  Result Value Ref Range Status   MRSA, PCR NEGATIVE NEGATIVE Final   Staphylococcus aureus NEGATIVE NEGATIVE Final    Comment:        The Xpert SA Assay (FDA approved for NASAL specimens in patients over 21 years of age), is one component of a comprehensive surveillance program.  Test performance has been  validated by [facility] for patients greater than or equal to 1 year old. It is not intended to diagnose infection nor to guide or monitor treatment.   Culture, Urine     Status: None   Collection Time: 12/14/15  8:36 AM  Result Value Ref Range Status   Specimen Description URINE, CATHETERIZED  Final   Special Requests NONE  Final   Culture NO GROWTH 1 DAY  Final   Report Status 12/15/2015 FINAL  Final  Culture, blood (routine x 2)     Status: None (Preliminary result)   Collection Time: 12/14/15  9:00 AM  Result Value Ref Range Status   Specimen Description BLOOD LEFT ANTECUBITAL  Final   Special Requests BOTTLES DRAWN AEROBIC AND ANAEROBIC 10CC  Final   Culture NO GROWTH 3 DAYS  Final   Report Status PENDING  Incomplete  Culture, blood (routine x 2)     Status: None (Preliminary result)   Collection Time: 12/14/15  9:15 AM  Result Value Ref Range Status   Specimen Description BLOOD BLOOD LEFT FOREARM  Final   Special Requests BOTTLES DRAWN AEROBIC AND ANAEROBIC 10CC  Final     Culture NO GROWTH 3 DAYS  Final   Report Status PENDING  Incomplete     Studies: No results found.  Scheduled Meds: . busPIRone  7.5 mg Oral BID  . citalopram  20 mg Oral Daily  . diltiazem  300 mg Oral Daily  . fluticasone  2 spray Each Nare Daily  . insulin aspart  0-9 Units Subcutaneous TID WC  . isosorbide mononitrate  30 mg Oral Daily  . levalbuterol  0.63 mg Nebulization Q6H  . loratadine  10 mg Oral Daily  . methylPREDNISolone (SOLU-MEDROL) injection  60 mg Intravenous 3 times per day  . metoprolol  100 mg Oral BID  . pantoprazole  40 mg Oral Daily  . piperacillin-tazobactam (ZOSYN)  IV  3.375 g Intravenous 3 times per day  . polyethylene glycol  17 g Oral Daily  . potassium chloride SA  20 mEq Oral Daily  . vancomycin  1,000 mg Intravenous Q24H  . warfarin  2.5 mg Oral ONCE-1800  . Warfarin - Pharmacist Dosing Inpatient   Does not apply q1800   Continuous Infusions:     Time  spent: 25 minutes    ELMAHI,MUTAZ A  [facility] Pager [phone] If 7PM-7AM, please contact night-coverage at [email or web address], password [email or web address] 12/17/2015, 12:55 PM  LOS: 8 days             

## 2015-12-17 NOTE — Progress Notes (Signed)
ANTICOAGULATION and ANTIBIOTIC CONSULT NOTE - Follow Up Consult  Pharmacy Consult for Coumadin and Vancomycin and Zosyn Indication: atrial fibrillation and sepsis/pneumonia coverage  No Known Allergies  Patient Measurements: Height: 5' (152.4 cm) Weight: 172 lb 6.4 oz (78.2 kg) IBW/kg (Calculated) : 45.5  Vital Signs: Temp: 100.4 F (38 C) (12/21 1145) Temp Source: Axillary (12/21 1145) BP: 133/90 mmHg (12/21 0753) Pulse Rate: 112 (12/21 0753)  Labs:  Recent Labs  12/15/15 0300 12/16/15 0418 12/17/15 0340  HGB  --   --  10.2*  HCT  --   --  31.8*  PLT  --   --  135*  LABPROT 19.6* 25.9* 26.8*  INR 1.66* 2.41* 2.52*  CREATININE 1.73*  --  1.82*    Estimated Creatinine Clearance: 22.8 mL/min (by C-G formula based on Cr of 1.82).  Assessment:  On Coumadin prior to admission for afib admitted 12/13 with right elbow fracture.  INR 2.01 on admit; reversed with Vitamin K 2mg  PO on 2/13.    ORIF right elbow 12/14. Coumadin resumed 12/15.  INR is therapeutic (2.52), refused Coumadin dose 12/21.  Home regimen: 5 mg Tues/Thurs, 2.5 mg all other days.      Day # 4 Vanc and Zosyn for sepsis and pneumonia after 5 days of Clindamycin for LLE cellulitis.  Cultures negative to date.   Tmax 101.6, WBC 11.5.  Baseline creatinine 1.58, but max 2.24 on 12/17.   Scr 1.95 on 12/18 when Vanc/Zosyn begun, 1.82 today.   Antibiotic doses remain appropriate.  Goal of Therapy:  INR 2-3 Monitor platelets by anticoagulation protocol: Yes Vancomycin troughs 15-20 mcg/ml Appropriate Zosyn dose for renal function and infection   Plan:   Coumadin 2.5 mg today.  Daily PT/INR.   Continue Vancomycin 1gm IV q24hrs.  Continue Zosyn 3.375 gm IV q8hrs (each over 4 hrs).  Will defer vanc trough as possible discharge to residential hospice tomorrow.  Follow renal function, final culture data, progress.  Dennie FettersEgan, Tanganika Barradas Donovan, RPh Pager: [phone] 12/17/2015,3:37 PM

## 2015-12-18 DIAGNOSIS — I2510 Atherosclerotic heart disease of native coronary artery without angina pectoris: Secondary | ICD-10-CM

## 2015-12-18 LAB — PROTIME-INR
INR: 2.48 — ABNORMAL HIGH (ref 0.00–1.49)
Prothrombin Time: 26.6 seconds — ABNORMAL HIGH (ref 11.6–15.2)

## 2015-12-18 LAB — GLUCOSE, CAPILLARY
Glucose-Capillary: 159 mg/dL — ABNORMAL HIGH (ref 65–99)
Glucose-Capillary: 174 mg/dL — ABNORMAL HIGH (ref 65–99)

## 2015-12-18 MED ORDER — POLYETHYLENE GLYCOL 3350 17 G PO PACK: 17.0000 g | PACK | Freq: Every day | ORAL | Status: AC

## 2015-12-18 MED ORDER — WARFARIN SODIUM 5 MG PO TABS: 5.0000 mg | ORAL_TABLET | Freq: Once | ORAL | Status: DC

## 2015-12-18 MED ORDER — LEVALBUTEROL HCL 0.63 MG/3ML IN NEBU
0.6300 mg | INHALATION_SOLUTION | Freq: Three times a day (TID) | RESPIRATORY_TRACT | Status: DC
  Administered 2015-12-18 (×2): 0.63 mg via RESPIRATORY_TRACT
  Filled 2015-12-18 (×2): qty 3

## 2015-12-18 MED ORDER — TORSEMIDE 20 MG PO TABS: 20.0000 mg | ORAL_TABLET | ORAL | Status: AC | PRN

## 2015-12-18 MED ORDER — PREDNISONE 10 MG PO TABS: ORAL_TABLET | ORAL | Status: AC

## 2015-12-18 MED ORDER — OXYCODONE HCL 5 MG PO TABS: 5.0000 mg | ORAL_TABLET | ORAL | Status: AC | PRN

## 2015-12-18 MED ORDER — WARFARIN SODIUM 5 MG PO TABS: 2.5000 mg | ORAL_TABLET | Freq: Once | ORAL | Status: DC

## 2015-12-18 NOTE — Clinical Social Work Note (Signed)
CSW received consult for patient needing EMS transport to patient's home for home with hospice.  CSW contacted [facility] and scheduled transport for around 4pm.  Bedside nurse made aware, CSW to sign off please reconsult if other social work needs arise.  Ervin KnackEric R. Vila Dory, MSW, Theresia MajorsLCSWA [phone] 12/18/2015 2:56 PM

## 2015-12-18 NOTE — Progress Notes (Addendum)
ANTICOAGULATION  CONSULT NOTE - Follow Up Consult  Pharmacy Consult for Coumadin Indication: atrial fibrillation  No Known Allergies  Patient Measurements: Height: 5' (152.4 cm) Weight: 178 lb 12.7 oz (81.1 kg) IBW/kg (Calculated) : 45.5  Vital Signs: Temp: 98.4 F (36.9 C) (12/22 1249) Temp Source: Oral (12/22 1249) BP: 142/99 mmHg (12/22 1249) Pulse Rate: 88 (12/22 1249)  Labs:  Recent Labs  12/16/15 0418 12/17/15 0340 12/18/15 0512  HGB  --  10.2*  --   HCT  --  31.8*  --   PLT  --  135*  --   LABPROT 25.9* 26.8* 26.6*  INR 2.41* 2.52* 2.48*  CREATININE  --  1.82*  --     Estimated Creatinine Clearance: 23.2 mL/min (by C-G formula based on Cr of 1.82).  Assessment:  On Coumadin prior to admission for afib admitted 12/13 with right elbow fracture.  INR 2.01 on admit; reversed with Vitamin K 2mg  PO on 2/13.    ORIF right elbow 12/14. Coumadin resumed 12/15.  INR is therapeutic (2.48), refused Coumadin dose 12/20.  Home regimen: 5 mg Tues/Thursdays, 2.5 mg all other days.   Goal of Therapy:  INR 2-3 Monitor platelets by anticoagulation protocol: Yes    Plan:  Coumadin 5mg  as per home regimen. Recommend continue home regimen upon discharge with  INR check in 1 week.   Daily PT/INR.  Link SnufferJessica Analee Montee, PharmD, BCPS Clinical Pharmacist [phone]  12/18/2015,1:02 PM

## 2015-12-18 NOTE — Discharge Planning (Addendum)
AVS reviewed with neice and rx given to her. Will transport home via ambulance. Equipment was delivered to home.  CSW to set up transport home.

## 2015-12-18 NOTE — Care Management Note (Signed)
Case Management Note  Patient Details  Name: [name] MRN: [id number] Date of Birth: [birth year]  Subjective/Objective:    Notified by attending that pt is ready for dc home today with family.  [facility] by [facility] Case Manager.                Action/Plan: Spoke with pt's niece in room; she states all DME has been delivered to home today.  She states pt's nephew, [name], has spoken with Hospice representative, and family is prepared to take pt home today.  Notified Hospice agency of pt discharging home today.  Pt will need ambulance transport home; referral to CSW for transport need.  Bedside nurse notified to call Hospice agency at [phone] when ambulance here to transport pt home.    Expected Discharge Date:    12/18/15              Expected Discharge Plan:  Home w Hospice Care  In-House Referral:  Clinical Social Work  Discharge planning Services  CM Consult  Post Acute Care Choice:  [facility]  [facility]  DME Arranged:  Oxygen, Hospital bed DME Agency:     HH Arranged:  RN, Nurse's Aide, Social Work [employer]  [facility]  Status of Service:  Completed, signed off  [employer] Important Message Given:  Yes Date Medicare IM Given:    Medicare IM give by:    Date Additional Medicare IM Given:    Additional Medicare Important Message give by:     If discussed at Long Length of Stay Meetings, dates discussed:    Additional Comments:  Quintella BatonJulie W. Glory Graefe, RN, BSN  Trauma/Neuro ICU Case Manager [phone]

## 2015-12-19 ENCOUNTER — Encounter: Payer: Self-pay | Admitting: *Deleted

## 2015-12-19 ENCOUNTER — Other Ambulatory Visit: Payer: Self-pay | Admitting: *Deleted

## 2015-12-19 LAB — CULTURE, BLOOD (ROUTINE X 2)
Culture: NO GROWTH
Culture: NO GROWTH

## 2015-12-19 NOTE — Patient Outreach (Signed)
12/19/15- [facility], spoke with LenoxLawanda, confirmed that hospice did see pt for home visit last night and admitted pt to services, case closed, letter faxed to primary care provider [name] NP and informed of case closure.  [name] [facility], BSN [facility] Community Care Coordinator [phone]

## 2015-12-21 NOTE — Discharge Summary (Addendum)
[facility] Discharge Summary   Patient: [name]    ZDG:[id number] PCP: Chevis Pretty, [name]  DOB: [birth year] Date of admission: 12/09/2015   Date of discharge: 12/18/2015   Discharge Diagnoses:  Active Problems:   Essential hypertension, benign   CAD (coronary artery disease), native coronary artery   Atrial fibrillation (HCC)   Gastroesophageal reflux disease without esophagitis   Osteoporosis   COPD (chronic obstructive pulmonary disease) (HCC)   Acute on chronic respiratory failure with hypoxia (HCC)   Diabetes mellitus without complication (HCC)   Olecranon fracture   CKD (chronic kidney disease), stage III   Depression   Pelvic fracture (HCC)   Bilateral lower extremity edema   Acute renal failure superimposed on stage 3 chronic kidney disease (HCC)   Shortness of breath   Encounter for palliative care   Goals of care, counseling/discussion   HAP (hospital-acquired pneumonia)   Recommendations for Outpatient Follow-up:  1.  Follow-up with orthopedic surgery and PCP as scheduled 2. Being discharged with [facility] of [facility] recommendation: Regular diet  Activity: The patient is advised to gradually reintroduce usual activities.  Discharge Condition: fair  History of present illness: As per the H and P dictated on admission, "[name] is a 81 y.o. female with multiple medical problems limited to coronary artery disease/history of remote PCI, COPD on home O2, chronic kidney disease and atrial fibrillation on Coumadin. Patient currently residing at rehabilitation facility. This morning she lost her balance and fell resulting in a fracture of her right elbow and mildly displaced fractures of the right superior and inferior pubic rami. No other medical complaints. She does endorse polyuria which started when diuretics were recently increased. No dysuria or hematuria"  Hospital Course:  Summary of her active problems in the hospital is as  following.  Acute on chronic hypoxic respiratory failure Likely a combination of acute diastolic CHF and COPD exacerbation. - Last echo from 07/2015 shows EF of 50-55% with grade 3 diastolic dysfunction.. -Patient had worsening shortness of breath postop and transfer to stepdown unit.  Continue to require NRB followed by high o2 requirement. Has persistent fever. follow-up chest x-ray showing right lower lobe pneumonia. Added empiric vancomycin and Zosyn.  - Patient finished 5 days of clindamycin and 5 day vancomycin and Zosyn, total 10 day course. Oxygenation improved to 3 L, no fever reported, cultures remain negative.  - Chest x ray showed persistent vascular congestion. added solumedrol and scheduled nebs. Patient was discharged on tapering course of prednisone  -Patient was also started on IV Lasix but later on renal function worsen. Discontinued Lasix.  -symptoms unimproved despite adequate management. palliaitve care met with family and wished for minimal intervention. family inclined towards home with hospice.  -Per previous notes patient will go home with home hospice, with [facility].   Hospital Acquired pneumonia As above. 10-day Empiric antibiotics. cx negative. Supportive care.  Right olecranon fracture ORIF on 12/14. Transferred to stepdown unit given shortness of breath. Pain control as needed. Orthopedics ordered new fiberglass splint. Pt unable to tolerate PT. Recommend partial weightbearing (50%) on both the right upper and lower extremities. Added bowel regimen. Prescription for oxycodone was given to assist with hospice management pain. Patient will follow-up with orthopedic as scheduled  Right pelvic and acetabular fracture Recommend partial weightbearing (50%) on right side.  Acute on chronic kidney disease stage III Renal function worsened due to IV Lasix. Avoid nephrotoxins. Discharged on oral torsemide as needed instead  of  scheduled  Paroxysmal A. fib Coumadin held on admission and given vitamin K. Resumed warfarin postop. Continue metoprolol and Cardizem. INR remained therapeutic  Left lower leg cellulitis Treated with clindamycin for 5 days and vancomycin and Zosyn for 5 days.. resolved.  GERD Continue PPI  Type 2 diabetes mellitus Monitor with sliding scale coverage.  Depression Stable. Continue home medications.   All other chronic medical condition were stable during the hospitalization.  Patient was seen by physical therapy. Hospice was arranged by Education officer, museum and case Freight forwarder. On the day of the discharge the patient's symptoms were under control, and no other acute medical condition were reported by patient. the patient was felt safe to be discharge at home with hospice.  Procedures and Results:  Open reduction and internal fixation of the displaced olecranon fracture right 12/10/2015   Consultations:  Orthopedics  Palliative care  Discharge Exam: Filed Weights   12/16/15 0359 12/17/15 0427 12/18/15 0514  Weight: 79.7 kg (175 lb 11.3 oz) 78.2 kg (172 lb 6.4 oz) 81.1 kg (178 lb 12.7 oz)   Filed Vitals:   12/18/15 0514 12/18/15 1249  BP: 154/92 142/99  Pulse: 114 88  Temp: 99.1 F (37.3 C) 98.4 F (36.9 C)  Resp: 17 18   General: Appear in mild distress, no Rash; Oral Mucosa moist. Cardiovascular: S1 and S2 Present, no Murmur, no JVD Respiratory: Bilateral Air entry present and basal Crackles, occasional wheezes Abdomen: Bowel Sound present, Soft and non tenderness Extremities: no Pedal edema, no calf tenderness Neurology: Grossly no focal neuro deficit.  DISCHARGE MEDICATION: Discharge Instructions    Diet - low sodium heart healthy    Complete by:  As directed      Increase activity slowly    Complete by:  As directed           Discharge Medication List as of 12/18/2015  2:31 PM    START taking these medications   Details  oxyCODONE (OXY IR/ROXICODONE) 5 MG  immediate release tablet Take 1 tablet (5 mg total) by mouth every 3 (three) hours as needed for moderate pain., Starting 12/18/2015, Until Discontinued, Print    polyethylene glycol (MIRALAX / GLYCOLAX) packet Take 17 g by mouth daily., Starting 12/18/2015, Until Discontinued, Print    predniSONE (DELTASONE) 10 MG tablet Take 50 mg daily for 3 days,Take 40 mg daily for 3 days,Take 30 mg daily for 3 days,Take 20 mg daily for 3 days,Take 10 mg daily for 3 days, then stop., Print      CONTINUE these medications which have CHANGED   Details  torsemide (DEMADEX) 20 MG tablet Take 1 tablet (20 mg total) by mouth as needed (weight gain of more than 2 lbs, pedal edema)., Starting 12/18/2015, Until Discontinued, No Print      CONTINUE these medications which have NOT CHANGED   Details  atorvastatin (LIPITOR) 40 MG tablet TAKE 1 TABLET (40 MG TOTAL) BY MOUTH DAILY., Normal    busPIRone (BUSPAR) 7.5 MG tablet Take 1 tablet (7.5 mg total) by mouth 2 (two) times daily. ., Starting 09/24/2015, Until Discontinued, Normal    dexlansoprazole (DEXILANT) 60 MG capsule TAKE 1 CAPSULE (60 MG TOTAL) BY MOUTH DAILY., Normal    Fluticasone-Salmeterol (ADVAIR DISKUS) 250-50 MCG/DOSE AEPB Inhale 1 puff into the lungs 2 (two) times daily., Starting 06/16/2015, Until Discontinued, Normal    metoprolol (LOPRESSOR) 100 MG tablet Take 1 tablet (100 mg total) by mouth 2 (two) times daily., Starting 10/06/2015, Until Discontinued, Normal  nitroGLYCERIN (NITROSTAT) 0.4 MG SL tablet Place 1 tablet (0.4 mg total) under the tongue every 5 (five) minutes as needed for chest pain., Starting 04/10/2014, Until Discontinued, Normal    tiotropium (SPIRIVA HANDIHALER) 18 MCG inhalation capsule PLACE 1 CAPSULE (18 MCG TOTAL) INTO INHALER AND INHALE DAILY., Normal    warfarin (COUMADIN) 5 MG tablet Take 0.5-1 tablets (2.5-5 mg total) by mouth See admin instructions. Takes 1 tablet (15m) every Tuesdays and Fridays and 1/2 tablet (2.5  mg) all other days., Starting 09/16/2015, Until Discontinued, Print    citalopram (CELEXA) 20 MG tablet Take 1 tablet (20 mg total) by mouth daily., Starting 10/14/2015, Until Discontinued, Normal    diltiazem (CARDIZEM CD) 360 MG 24 hr capsule Take 1 capsule (360 mg total) by mouth daily., Starting 10/14/2015, Until Discontinued, Normal    fluticasone (FLONASE) 50 MCG/ACT nasal spray Place 2 sprays into both nostrils daily., Starting 10/14/2015, Until Discontinued, Normal    glucose blood (ONETOUCH VERIO) test strip Test 1x per day and prn   Dx- e11.9, Normal    isosorbide mononitrate (IMDUR) 30 MG 24 hr tablet Take 1 tablet (30 mg total) by mouth daily., Starting 11/17/2015, Until Discontinued, Normal    Lancets (ONETOUCH ULTRASOFT) lancets Use as instructed, Normal    loratadine (CLARITIN) 10 MG tablet Take 1 tablet (10 mg total) by mouth daily., Starting 11/18/2015, Until Discontinued, OTC    potassium chloride SA (K-DUR,KLOR-CON) 20 MEQ tablet Take 20 mEq by mouth daily., Until Discontinued, Historical Med      STOP taking these medications     HYDROcodone-acetaminophen (NORCO/VICODIN) 5-325 MG per tablet      alendronate (FOSAMAX) 70 MG tablet      metFORMIN (GLUMETZA) 500 MG (MOD) 24 hr tablet        No Known Allergies Follow-up Information    Follow up with GRAVES,JOHN L, MD. Schedule an appointment as soon as possible for a visit in 2 weeks.   Specialty:  Orthopedic Surgery   Contact information:   [address] [phone]      Follow up with GRAVES,JOHN L, MD In 2 weeks.   Specialty:  Orthopedic Surgery   Contact information:   [place] [phone]      Follow up with MChevis Pretty [name]. Schedule an appointment as soon as possible for a visit in 1 week.   Specialty:  Family Medicine   Contact information:   [place] [phone]      The results of significant  diagnostics from this hospitalization (including imaging, microbiology, ancillary and laboratory) are listed below for reference.    Significant Diagnostic Studies: Dg Elbow Complete Right  12/09/2015  CLINICAL DATA:  Status post fall, with right elbow pain. Initial encounter. EXAM: RIGHT ELBOW - COMPLETE 3+ VIEW COMPARISON:  None. FINDINGS: There is a significantly displaced fracture of the olecranon, demonstrating up to 3 cm of displacement. Overlying soft tissue swelling is noted. An elbow joint effusion is seen. IMPRESSION: Significantly displaced fracture of the olecranon, demonstrating up to 3 cm of displacement. Overlying soft tissue swelling and elbow joint effusion noted. Electronically Signed   By: JGarald BaldingM.D.   On: 12/09/2015 05:54   Dg Chest Port 1 View  12/14/2015  CLINICAL DATA:  Sepsis.  Fever. EXAM: PORTABLE CHEST - 1 VIEW COMPARISON:  One-view chest x-ray 12/13/2015. FINDINGS: The heart size is normal. Atherosclerotic calcifications are present in the aorta.  New right lower lobe airspace disease is present. Mild pulmonary vascular congestion is noted. IMPRESSION: 1. New right lower lobe pneumonia. 2. Cardiomegaly and mild pulmonary vascular congestion. 3. Atherosclerosis of the thoracic aorta. Electronically Signed   By: San Morelle M.D.   On: 12/14/2015 13:32   Dg Chest Port 1 View  12/13/2015  CLINICAL DATA:  Patient with shortness of breath and cough for 2 days. EXAM: PORTABLE CHEST 1 VIEW COMPARISON:  Chest radiograph 12/11/2015. FINDINGS: Multiple monitoring leads overlie the patient. Stable enlarged cardiac and mediastinal contours. Peritracheal soft tissues are nonspecific however may be vascular in etiology. Interval development of mid and lower lung heterogeneous pulmonary opacities. No large area of pleural effusion or pneumothorax. IMPRESSION: Interval development mid and lower lung heterogeneous opacities which may represent atelectasis and/or edema.  Peritracheal soft tissues nonspecific however may be vascular in etiology. Electronically Signed   By: Lovey Newcomer M.D.   On: 12/13/2015 09:53   Dg Chest Port 1 View  12/11/2015  CLINICAL DATA:  Hypoxia.  Patient is on oxygen. EXAM: PORTABLE CHEST 1 VIEW COMPARISON:  12/09/2015 FINDINGS: Shallow inspiration. Cardiac enlargement with mild pulmonary vascular congestion. Hazy perihilar and basilar infiltrates suggest early edema. No focal consolidation. Blunting of the costophrenic angles suggests small effusions. Calcified and tortuous aorta. No pneumothorax. IMPRESSION: Cardiac enlargement with mild pulmonary vascular congestion and basilar edema. Probable small bilateral pleural effusions. Electronically Signed   By: Lucienne Capers M.D.   On: 12/11/2015 01:33   Dg Chest Portable 1 View  12/09/2015  CLINICAL DATA:  Acute onset of hypoxia.  Initial encounter. EXAM: PORTABLE CHEST 1 VIEW COMPARISON:  Chest radiograph performed 11/14/2015 FINDINGS: The lungs are well-aerated. Mild vascular congestion noted. Mild bilateral atelectasis is seen. There is no evidence of pleural effusion or pneumothorax. The cardiomediastinal silhouette is mildly enlarged. No acute osseous abnormalities are seen. IMPRESSION: Mild vascular congestion and mild cardiomegaly noted. Mild bilateral atelectasis seen. Electronically Signed   By: Garald Balding M.D.   On: 12/09/2015 07:01   Dg Hip Unilat  With Pelvis 2-3 Views Right  12/09/2015  CLINICAL DATA:  Status post fall, with right hip pain. Initial encounter. EXAM: DG HIP (WITH OR WITHOUT PELVIS) 2-3V RIGHT COMPARISON:  None. FINDINGS: There are mildly displaced fractures of the right superior and inferior pubic rami. Extension of the right superior pubic ramus fracture to the acetabulum cannot be excluded. Both femoral heads are seated normally within their respective acetabula. The proximal right femur appears intact. Degenerative change is noted along the lower lumbar spine.  The sacroiliac joints are unremarkable in appearance. The visualized bowel gas pattern is grossly unremarkable in appearance. Scattered vascular calcifications are seen. IMPRESSION: Mildly displaced fractures of the right superior and inferior pubic rami. Extension of the right superior pubic ramus fracture to the acetabulum cannot be excluded. Electronically Signed   By: Garald Balding M.D.   On: 12/09/2015 05:52    Microbiology: Recent Results (from the past 240 hour(s))  Culture, Urine     Status: None   Collection Time: 12/14/15  8:36 AM  Result Value Ref Range Status   Specimen Description URINE, CATHETERIZED  Final   Special Requests NONE  Final   Culture NO GROWTH 1 DAY  Final   Report Status 12/15/2015 FINAL  Final  Culture, blood (routine x 2)     Status: None   Collection Time: 12/14/15  9:00 AM  Result Value Ref Range Status   Specimen Description BLOOD LEFT ANTECUBITAL  Final   Special Requests BOTTLES DRAWN AEROBIC AND ANAEROBIC 10CC  Final   Culture NO GROWTH 5 DAYS  Final   Report Status 12/19/2015 FINAL  Final  Culture, blood (routine x 2)     Status: None   Collection Time: 12/14/15  9:15 AM  Result Value Ref Range Status   Specimen Description BLOOD BLOOD LEFT FOREARM  Final   Special Requests BOTTLES DRAWN AEROBIC AND ANAEROBIC 10CC  Final   Culture NO GROWTH 5 DAYS  Final   Report Status 12/19/2015 FINAL  Final     Labs: CBC:  Recent Labs Lab 12/17/15 0340  WBC 11.5*  HGB 10.2*  HCT 31.8*  MCV 92.2  PLT 045*   Basic Metabolic Panel:  Recent Labs Lab 12/15/15 0300 12/17/15 0340  NA 135 132*  K 3.6 4.4  CL 93* 93*  CO2 29 28  GLUCOSE 160* 129*  BUN 39* 47*  CREATININE 1.73* 1.82*  CALCIUM 8.0* 8.3*   BNP (last 3 results)  Recent Labs  11/13/15 0845 12/09/15 1205 12/11/15 0124  BNP 882.8* 1201.2* 739.1*    ProBNP (last 3 results) No results for input(s): PROBNP in the last 8760 hours.  CBG:  Recent Labs Lab 12/17/15 1144  12/17/15 1644 12/17/15 2122 12/18/15 0734 12/18/15 1241  GLUCAP 264* 149* 143* 159* 174*    Time spent: 30 minutes  Signed:  Verdell Dykman  [facility] 12/18/2015, 7:21 AM

## 2015-12-23 ENCOUNTER — Encounter: Payer: Self-pay | Admitting: Licensed Clinical Social Worker

## 2015-12-23 ENCOUNTER — Telehealth: Payer: Self-pay | Admitting: Nurse Practitioner

## 2015-12-23 ENCOUNTER — Other Ambulatory Visit: Payer: Self-pay | Admitting: Licensed Clinical Social Worker

## 2015-12-23 NOTE — Patient Outreach (Signed)
Assessment:  CSW completed chart review on client on 12/23/15.  Upon chart review, CSW read that client had recently been discharged home from the hospital and had then [place] [facility] [place] [facility] [place] [facility] [place] [facility] [place] and [place] receiving in [place] [facility] [place]. RN [name] [facility]15 and she faxed a letter to [name], Nurse Practitioner for client, informing [name] of case closure for client due to fact that client is now admitted [facility] [place].  CSW is also [facility] on 12/23/15 due to fact that [facility] [place]  Plan:  CSW is discharging client [facility] services on 12/23/15 since [facility] [place] CSW to inform [name] that CSW [facility] on 12/23/15.  [name] MSW, LCSW Licensed Clinical Social Worker [facility] [phone]

## 2015-12-23 NOTE — Telephone Encounter (Signed)
FYI

## 2015-12-25 ENCOUNTER — Ambulatory Visit: Payer: Medicare Other | Admitting: Nurse Practitioner

## 2015-12-26 ENCOUNTER — Ambulatory Visit: Payer: Medicare Other | Admitting: Licensed Clinical Social Worker

## 2015-12-30 ENCOUNTER — Encounter: Payer: Self-pay | Admitting: Nurse Practitioner

## 2016-01-28 DEATH — deceased

## 2016-07-13 ENCOUNTER — Encounter (HOSPITAL_COMMUNITY): Payer: Medicare Other

## 2016-07-13 ENCOUNTER — Ambulatory Visit: Payer: Medicare Other | Admitting: Family

## 2017-02-18 IMAGING — DX DG CHEST 2V
2 series · 2 of 2 positions shown · non-contrast
Comparison: 10/09/2015

CLINICAL DATA: right leg edema.  Fluid leaking from foot for 1 day

EXAM:
CHEST  2 VIEW

[chest ap]
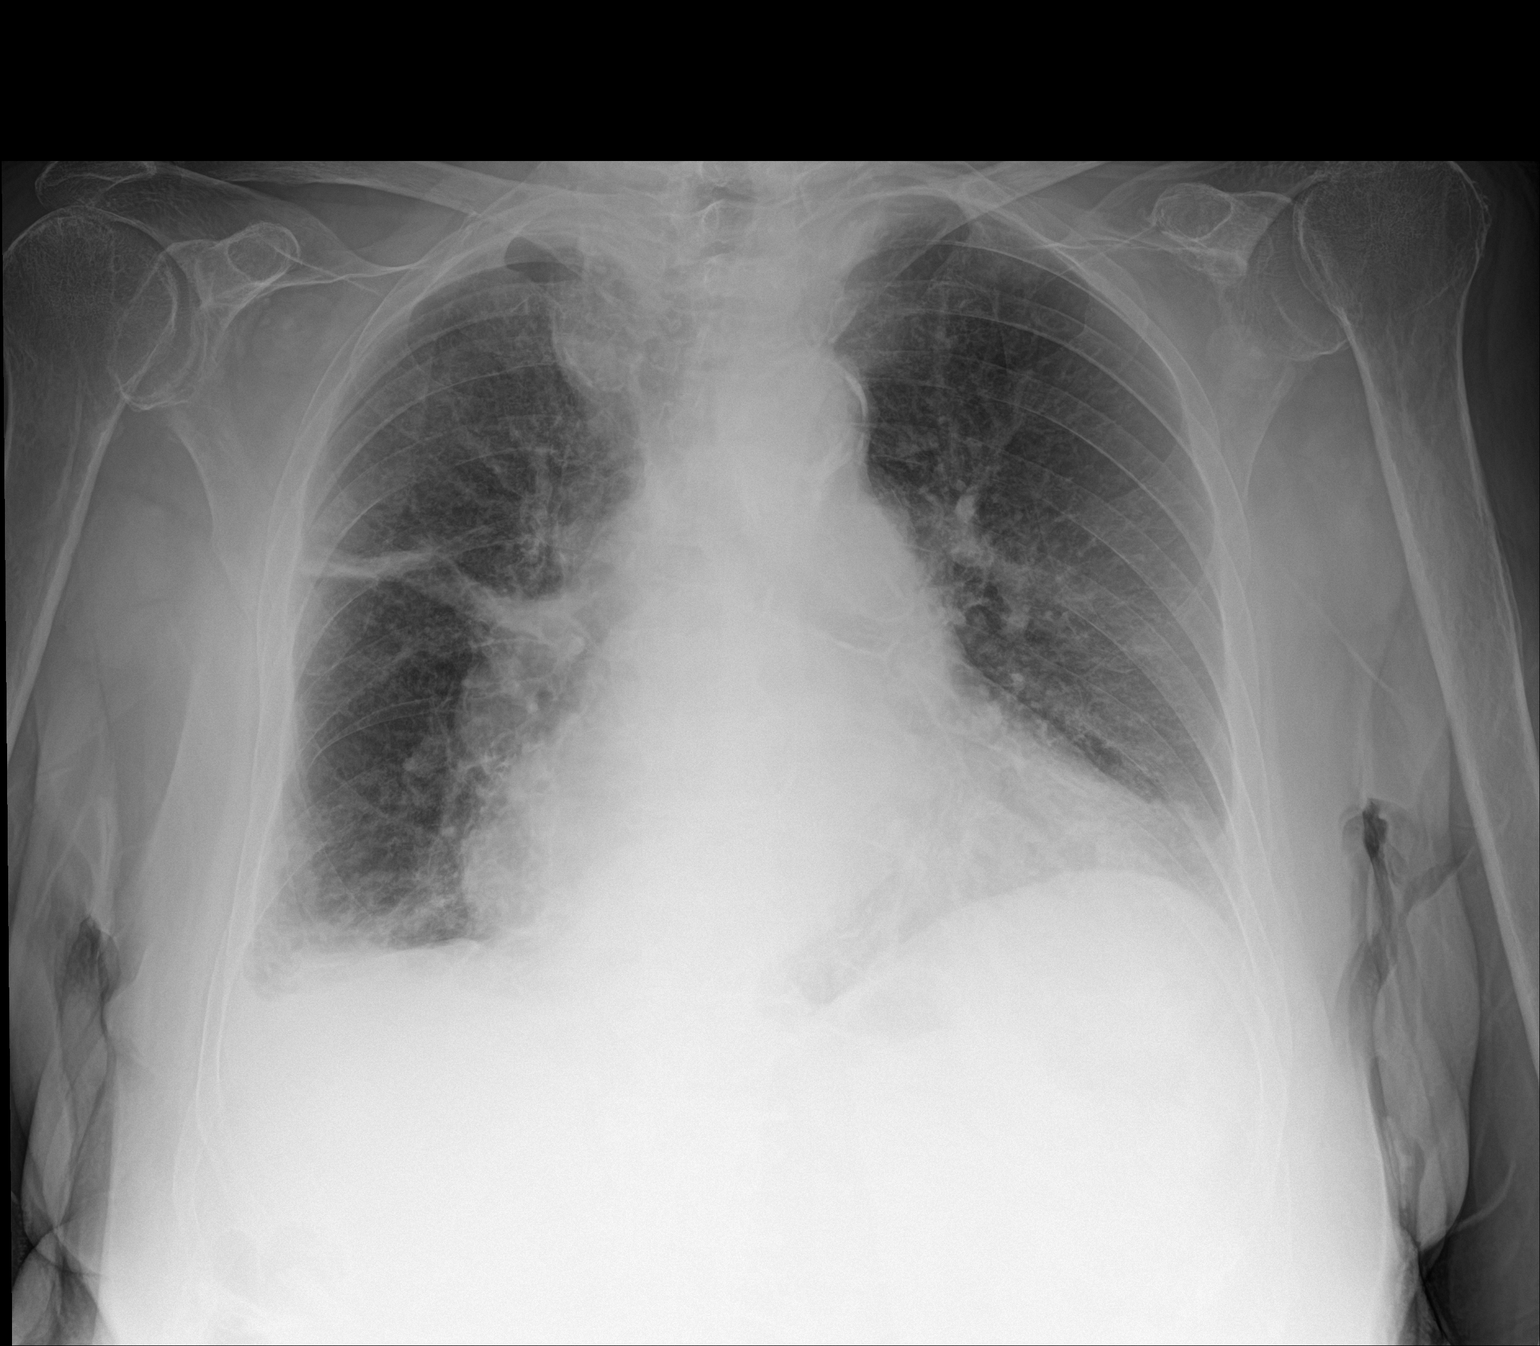

[chest lat]
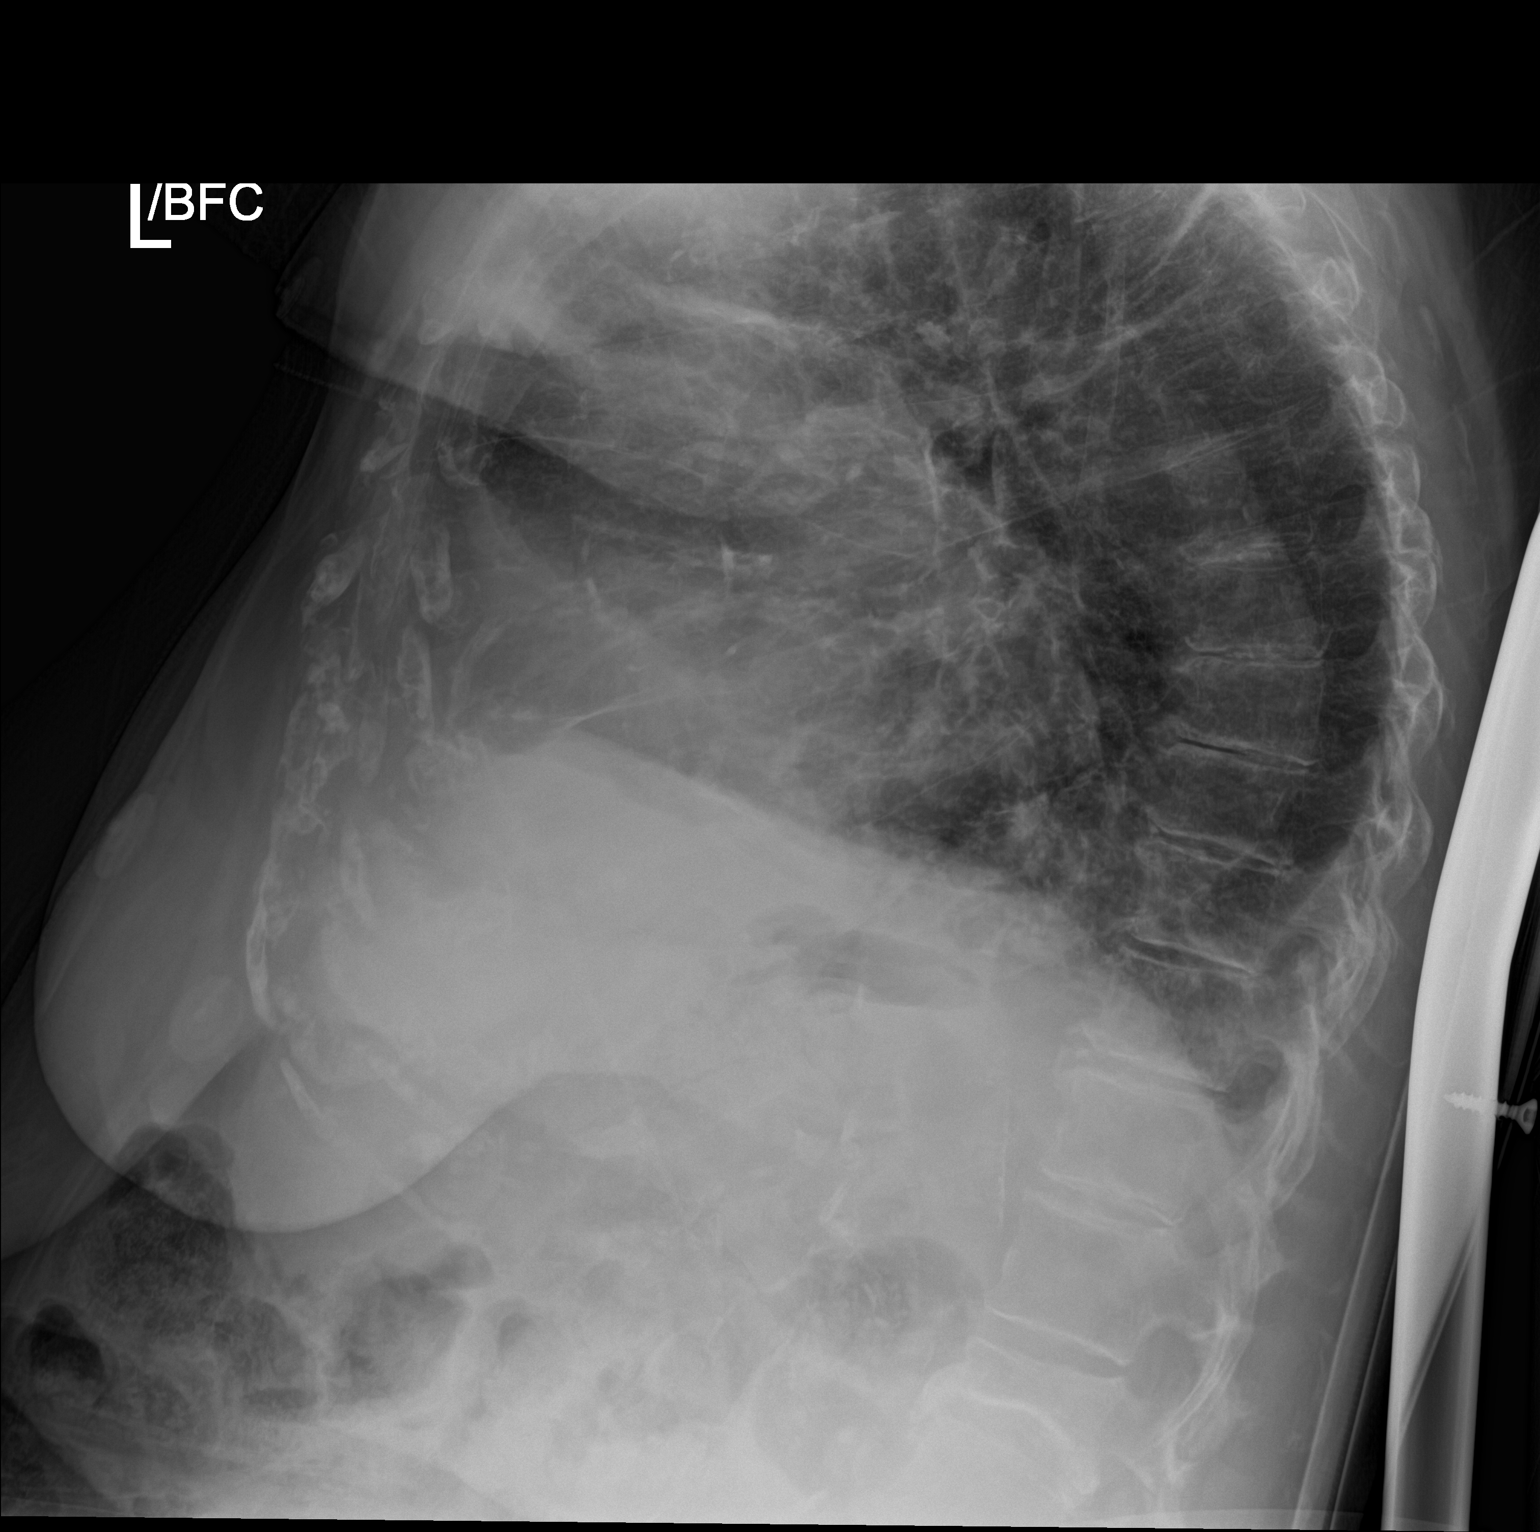

[2 of 2 positions shown; findings below may reference images not displayed]

FINDINGS: There is moderate cardiac enlargement. Aortic atherosclerosis noted.
Atelectasis is noted in the right base. There is a small right
pleural effusion identified. No interstitial edema. Multi level
degenerative disc disease identified within the thoracic spine.
IMPRESSION: 1. Cardiac enlargement, aortic atherosclerosis and small right
pleural effusion.
2. Right upper lobe atelectasis.
